# Patient Record
Sex: Female | Born: 1975
Health system: Southern US, Academic
[De-identification: ages and names within clinical notes are randomized; demographics above are authoritative.]

## PROBLEM LIST (undated history)

## (undated) ENCOUNTER — Telehealth

## (undated) ENCOUNTER — Encounter

## (undated) ENCOUNTER — Ambulatory Visit

## (undated) ENCOUNTER — Ambulatory Visit: Payer: MEDICARE

## (undated) ENCOUNTER — Encounter: Attending: Infectious Disease | Primary: Infectious Disease

## (undated) ENCOUNTER — Encounter: Attending: Nephrology | Primary: Nephrology

## (undated) ENCOUNTER — Encounter: Payer: MEDICARE | Attending: Nephrology | Primary: Nephrology

## (undated) ENCOUNTER — Telehealth: Attending: Family | Primary: Family

## (undated) ENCOUNTER — Encounter
Attending: Student in an Organized Health Care Education/Training Program | Primary: Student in an Organized Health Care Education/Training Program

## (undated) ENCOUNTER — Ambulatory Visit
Payer: MEDICARE | Attending: Student in an Organized Health Care Education/Training Program | Primary: Student in an Organized Health Care Education/Training Program

## (undated) ENCOUNTER — Ambulatory Visit: Payer: Medicare (Managed Care)

## (undated) ENCOUNTER — Ambulatory Visit: Payer: MEDICARE | Attending: Nephrology | Primary: Nephrology

## (undated) ENCOUNTER — Non-Acute Institutional Stay: Payer: MEDICARE | Attending: Nephrology | Primary: Nephrology

## (undated) ENCOUNTER — Inpatient Hospital Stay

## (undated) ENCOUNTER — Ambulatory Visit
Payer: Medicare (Managed Care) | Attending: Student in an Organized Health Care Education/Training Program | Primary: Student in an Organized Health Care Education/Training Program

## (undated) ENCOUNTER — Encounter: Attending: Neurology | Primary: Neurology

## (undated) ENCOUNTER — Encounter: Payer: MEDICARE | Attending: Registered" | Primary: Registered"

## (undated) ENCOUNTER — Encounter: Attending: Physician Assistant | Primary: Physician Assistant

## (undated) ENCOUNTER — Encounter
Attending: Pharmacist Clinician (PhC)/ Clinical Pharmacy Specialist | Primary: Pharmacist Clinician (PhC)/ Clinical Pharmacy Specialist

## (undated) ENCOUNTER — Telehealth: Attending: Nephrology | Primary: Nephrology

## (undated) ENCOUNTER — Encounter: Attending: General Practice | Primary: General Practice

## (undated) ENCOUNTER — Ambulatory Visit: Payer: Medicare (Managed Care) | Attending: Nephrology | Primary: Nephrology

## (undated) DIAGNOSIS — G4730 Sleep apnea, unspecified: Secondary | ICD-10-CM

## (undated) DIAGNOSIS — D649 Anemia, unspecified: Secondary | ICD-10-CM

## (undated) DIAGNOSIS — I10 Essential (primary) hypertension: Secondary | ICD-10-CM

## (undated) DIAGNOSIS — N289 Disorder of kidney and ureter, unspecified: Secondary | ICD-10-CM

---
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
## 2007-02-18 ENCOUNTER — Ambulatory Visit: Payer: Self-pay | Admitting: Family Medicine

## 2007-07-15 ENCOUNTER — Ambulatory Visit: Payer: Self-pay | Admitting: Internal Medicine

## 2007-10-18 ENCOUNTER — Ambulatory Visit: Payer: Self-pay | Admitting: Internal Medicine

## 2008-02-27 ENCOUNTER — Ambulatory Visit: Payer: Self-pay | Admitting: Internal Medicine

## 2011-05-29 DIAGNOSIS — M961 Postlaminectomy syndrome, not elsewhere classified: Secondary | ICD-10-CM | POA: Insufficient documentation

## 2011-05-29 DIAGNOSIS — IMO0002 Reserved for concepts with insufficient information to code with codable children: Secondary | ICD-10-CM | POA: Insufficient documentation

## 2011-06-13 DIAGNOSIS — M5126 Other intervertebral disc displacement, lumbar region: Secondary | ICD-10-CM | POA: Insufficient documentation

## 2012-06-26 HISTORY — PX: AV FISTULA PLACEMENT: SHX1204

## 2012-12-02 ENCOUNTER — Ambulatory Visit: Payer: Self-pay | Admitting: Vascular Surgery

## 2012-12-02 LAB — CBC
HGB: 9.4 g/dL — ABNORMAL LOW (ref 12.0–16.0)
MCH: 29.9 pg (ref 26.0–34.0)
MCHC: 34.3 g/dL (ref 32.0–36.0)
Platelet: 320 10*3/uL (ref 150–440)
RBC: 3.13 10*6/uL — ABNORMAL LOW (ref 3.80–5.20)
WBC: 7.5 10*3/uL (ref 3.6–11.0)

## 2012-12-02 LAB — BASIC METABOLIC PANEL
Anion Gap: 9 (ref 7–16)
Chloride: 111 mmol/L — ABNORMAL HIGH (ref 98–107)
Co2: 18 mmol/L — ABNORMAL LOW (ref 21–32)
EGFR (African American): 13 — ABNORMAL LOW
EGFR (Non-African Amer.): 11 — ABNORMAL LOW
Glucose: 104 mg/dL — ABNORMAL HIGH (ref 65–99)
Osmolality: 296 (ref 275–301)
Sodium: 138 mmol/L (ref 136–145)

## 2012-12-06 ENCOUNTER — Ambulatory Visit: Payer: Self-pay | Admitting: Vascular Surgery

## 2013-01-16 ENCOUNTER — Other Ambulatory Visit: Payer: Self-pay | Admitting: Vascular Surgery

## 2013-07-03 DIAGNOSIS — D649 Anemia, unspecified: Secondary | ICD-10-CM | POA: Insufficient documentation

## 2013-11-11 DIAGNOSIS — N186 End stage renal disease: Secondary | ICD-10-CM | POA: Insufficient documentation

## 2013-11-11 DIAGNOSIS — Z992 Dependence on renal dialysis: Secondary | ICD-10-CM

## 2014-09-02 DIAGNOSIS — F418 Other specified anxiety disorders: Secondary | ICD-10-CM | POA: Insufficient documentation

## 2014-09-10 DIAGNOSIS — Z992 Dependence on renal dialysis: Secondary | ICD-10-CM | POA: Insufficient documentation

## 2014-09-10 DIAGNOSIS — Z01818 Encounter for other preprocedural examination: Secondary | ICD-10-CM | POA: Insufficient documentation

## 2014-10-16 NOTE — Op Note (Signed)
PATIENT NAMEKNOWLEDGE, LEADERS MR#:  [id number] DATE OF BIRTH:  1976-03-23  DATE OF PROCEDURE:  12/06/2012  PREOPERATIVE DIAGNOSIS: End-stage renal disease requiring hemodialysis.   POSTOPERATIVE DIAGNOSIS: End-stage renal disease requiring hemodialysis.   PROCEDURE PERFORMED: Creation of a left arm brachiocephalic fistula.   SURGEON: Katha Cabal, MD   ANESTHESIA: MAC.   ESTIMATED BLOOD LOSS: Minimal.   SPECIMEN: None.   INDICATIONS: Vicki Mercer is a 39 year old woman with renal failure, who now is undergoing creation of a fistula for dialysis access. Risks and benefits were reviewed. All questions answered. The patient agrees to proceed.   DESCRIPTION OF PROCEDURE: The patient is taken to the operating room and placed in the supine position. After adequate MAC anesthesia is induced, she is positioned supine with her left arm extended palm upward. The left arm is prepped and draped in a sterile fashion. Appropriate timeout is called.   A surgical marker is then used to mark the presumed incision on the arm and 0.25% Marcaine is infiltrated in the soft tissues. A total of 27 mL of local was used throughout the case. A curvilinear incision is then created and the cephalic vein is exposed. It is dissected circumferentially for a distance of several centimeters. It is marked with a surgical marker for orientation.   The brachial artery is then exposed through the same incision. It is looped proximally and distally.   The vein is then ligated distally with -0 silk ties and transected with Potts scissors, dilated and flushed with a Garrett coronary dilator. Arteriotomy is then made after the Silastic vessel loops are used to control the brachial artery. Stay suture of 6-0 Prolene is placed. The vein is then delivered over to the artery and an end vein to side brachial artery anastomosis is fashioned using running 6-0 Prolene. Flushing maneuvers are performed and flow is established first to the  fistula and then distally to the hand. Radial pulses maintained and an excellent thrill is noted in the fistula. The wound is then irrigated and closed in layers using 3-0 Vicryl, followed by 4-0 Monocryl subcuticular and Dermabond. The patient tolerated the procedure well. There were no immediate complications and she is taken to the recovery area in excellent condition.  ____________________________ Katha Cabal, MD ggs:aw D: 12/06/2012 09:21:00 ET T: 12/06/2012 09:31:04 ET JOB#: [id number]  cc: Katha Cabal, MD, <Dictator> Vilma Prader, MD Katha Cabal MD ELECTRONICALLY SIGNED 12/11/2012 16:08

## 2015-01-27 DIAGNOSIS — I272 Pulmonary hypertension, unspecified: Secondary | ICD-10-CM | POA: Insufficient documentation

## 2015-07-07 ENCOUNTER — Other Ambulatory Visit
Admission: RE | Admit: 2015-07-07 | Discharge: 2015-07-07 | Disposition: A | Discharge status: Home / Self Care | Payer: Medicare Other | Source: Ambulatory Visit | Attending: Internal Medicine | Admitting: Internal Medicine

## 2015-07-07 DIAGNOSIS — E875 Hyperkalemia: Secondary | ICD-10-CM | POA: Diagnosis present

## 2015-07-07 LAB — POTASSIUM: Potassium: 6.2 mmol/L — ABNORMAL HIGH (ref 3.5–5.1)

## 2015-07-14 ENCOUNTER — Other Ambulatory Visit
Admission: RE | Admit: 2015-07-14 | Discharge: 2015-07-14 | Disposition: A | Discharge status: Home / Self Care | Payer: Medicare Other | Source: Ambulatory Visit | Attending: Internal Medicine | Admitting: Internal Medicine

## 2015-07-14 DIAGNOSIS — N186 End stage renal disease: Secondary | ICD-10-CM | POA: Insufficient documentation

## 2015-07-14 LAB — POTASSIUM: POTASSIUM: 5.8 mmol/L — AB (ref 3.5–5.1)

## 2016-03-17 ENCOUNTER — Other Ambulatory Visit
Admission: RE | Admit: 2016-03-17 | Discharge: 2016-03-17 | Disposition: A | Discharge status: Home / Self Care | Payer: Medicare Other | Source: Ambulatory Visit | Attending: Nephrology | Admitting: Nephrology

## 2016-03-17 DIAGNOSIS — N186 End stage renal disease: Secondary | ICD-10-CM | POA: Insufficient documentation

## 2016-03-18 ENCOUNTER — Other Ambulatory Visit
Admission: RE | Admit: 2016-03-18 | Discharge: 2016-03-18 | Disposition: A | Discharge status: Home / Self Care | Payer: Medicare Other | Source: Ambulatory Visit | Attending: Nephrology | Admitting: Nephrology

## 2016-03-18 DIAGNOSIS — R7881 Bacteremia: Secondary | ICD-10-CM | POA: Insufficient documentation

## 2016-03-18 DIAGNOSIS — N186 End stage renal disease: Secondary | ICD-10-CM | POA: Insufficient documentation

## 2016-03-18 DIAGNOSIS — B957 Other staphylococcus as the cause of diseases classified elsewhere: Secondary | ICD-10-CM | POA: Insufficient documentation

## 2016-03-18 DIAGNOSIS — D613 Idiopathic aplastic anemia: Secondary | ICD-10-CM | POA: Insufficient documentation

## 2016-03-18 LAB — HEMOGLOBIN: Hemoglobin: 6.4 g/dL — ABNORMAL LOW (ref 12.0–16.0)

## 2016-03-23 LAB — CULTURE, BLOOD (ROUTINE X 2)
CULTURE: NO GROWTH
Culture: NO GROWTH

## 2016-05-04 ENCOUNTER — Other Ambulatory Visit
Admission: RE | Admit: 2016-05-04 | Discharge: 2016-05-04 | Disposition: A | Discharge status: Home / Self Care | Payer: Medicare Other | Source: Ambulatory Visit | Attending: Nephrology | Admitting: Nephrology

## 2016-05-04 DIAGNOSIS — N186 End stage renal disease: Secondary | ICD-10-CM | POA: Diagnosis present

## 2016-05-04 LAB — BASIC METABOLIC PANEL
ANION GAP: 11 (ref 5–15)
BUN: 47 mg/dL — ABNORMAL HIGH (ref 6–20)
CALCIUM: 8.6 mg/dL — AB (ref 8.9–10.3)
CHLORIDE: 100 mmol/L — AB (ref 101–111)
CO2: 23 mmol/L (ref 22–32)
CREATININE: 7.94 mg/dL — AB (ref 0.44–1.00)
GFR calc non Af Amer: 6 mL/min — ABNORMAL LOW (ref >60)
GFR, EST AFRICAN AMERICAN: 7 mL/min — AB (ref >60)
Glucose, Bld: 120 mg/dL — ABNORMAL HIGH (ref 65–99)
Potassium: 3.2 mmol/L — ABNORMAL LOW (ref 3.5–5.1)
SODIUM: 134 mmol/L — AB (ref 135–145)

## 2016-05-04 LAB — ALBUMIN: ALBUMIN: 3.8 g/dL (ref 3.5–5.0)

## 2016-05-04 LAB — PHOSPHORUS: Phosphorus: 2.3 mg/dL — ABNORMAL LOW (ref 2.5–4.6)

## 2016-08-04 ENCOUNTER — Emergency Department
Admission: EM | Admit: 2016-08-04 | Discharge: 2016-08-04 | Disposition: A | Discharge status: Home / Self Care | Payer: Medicare Other | Attending: Emergency Medicine | Admitting: Emergency Medicine

## 2016-08-04 ENCOUNTER — Encounter: Payer: Self-pay | Admitting: Emergency Medicine

## 2016-08-04 DIAGNOSIS — N186 End stage renal disease: Secondary | ICD-10-CM | POA: Diagnosis not present

## 2016-08-04 DIAGNOSIS — R5383 Other fatigue: Secondary | ICD-10-CM | POA: Diagnosis present

## 2016-08-04 DIAGNOSIS — Z992 Dependence on renal dialysis: Secondary | ICD-10-CM | POA: Insufficient documentation

## 2016-08-04 DIAGNOSIS — I120 Hypertensive chronic kidney disease with stage 5 chronic kidney disease or end stage renal disease: Secondary | ICD-10-CM | POA: Diagnosis not present

## 2016-08-04 DIAGNOSIS — D649 Anemia, unspecified: Secondary | ICD-10-CM | POA: Insufficient documentation

## 2016-08-04 HISTORY — DX: Anemia, unspecified: D64.9

## 2016-08-04 HISTORY — DX: Essential (primary) hypertension: I10

## 2016-08-04 HISTORY — DX: Disorder of kidney and ureter, unspecified: N28.9

## 2016-08-04 LAB — CBC
HEMATOCRIT: 18.7 % — AB (ref 35.0–47.0)
HEMOGLOBIN: 6.5 g/dL — AB (ref 12.0–16.0)
MCH: 33.3 pg (ref 26.0–34.0)
MCHC: 34.9 g/dL (ref 32.0–36.0)
MCV: 95.4 fL (ref 80.0–100.0)
Platelets: 157 10*3/uL (ref 150–440)
RBC: 1.96 MIL/uL — ABNORMAL LOW (ref 3.80–5.20)
RDW: 13.4 % (ref 11.5–14.5)
WBC: 6 10*3/uL (ref 3.6–11.0)

## 2016-08-04 LAB — BASIC METABOLIC PANEL
ANION GAP: 11 (ref 5–15)
BUN: 65 mg/dL — ABNORMAL HIGH (ref 6–20)
CO2: 26 mmol/L (ref 22–32)
Calcium: 7.6 mg/dL — ABNORMAL LOW (ref 8.9–10.3)
Chloride: 100 mmol/L — ABNORMAL LOW (ref 101–111)
Creatinine, Ser: 10.35 mg/dL — ABNORMAL HIGH (ref 0.44–1.00)
GFR calc Af Amer: 5 mL/min — ABNORMAL LOW (ref >60)
GFR calc non Af Amer: 4 mL/min — ABNORMAL LOW (ref >60)
GLUCOSE: 101 mg/dL — AB (ref 65–99)
POTASSIUM: 3.4 mmol/L — AB (ref 3.5–5.1)
Sodium: 137 mmol/L (ref 135–145)

## 2016-08-04 NOTE — ED Triage Notes (Signed)
Says she has been weak and cold and had her hgb checked and it is low.  5.9

## 2016-08-04 NOTE — Discharge Instructions (Signed)
Please go to the clinic on Monday for your Procrit shot which will help you with your anemia. This week and get up slowly with no heavy exertional activity and continue your dialysis as you see fitted home. Return here to emergency department if you pass out or notice blood in the stool or have abdominal pain.  Please return immediately if condition worsens. Please contact her primary physician or the physician you were given for referral. If you have any specialist physicians involved in her treatment and plan please also contact them. Thank you for using [facility].

## 2016-08-04 NOTE — ED Provider Notes (Signed)
Time Seen: Approximately 1512  I have reviewed the triage notes  Chief Complaint: Weakness   History of Present Illness: Vicki Mercer is a 41 y.o. female **who has a long history of anemia associated with renal failure. She states she does home dialysis without incident and appears to be doing her dialysis fairly regularly. The patient states that the anemia occasionally she gets a "" shot "" at an outpatient clinic. She states she's felt some mild generalized fatigue and has felt cold and had her primary care physician check her hemoglobin. They apparently got a level of 5.9 and the patient was referred here to the emergency department for further evaluation. As long history of anemia occasionally gets a blood transfusion. She says it's been several months since she had a "" shot "".*   Past Medical History:  Diagnosis Date  . Anemia   . Hypertension   . Renal disorder     There are no active problems to display for this patient.   No past surgical history on file.  No past surgical history on file.    Allergies:  Ibuprofen  Family History: No family history on file.  Social History: Social History  Substance Use Topics  . Smoking status: Never Smoker  . Smokeless tobacco: Never Used  . Alcohol use No     Review of Systems:   10 point review of systems was performed and was otherwise negative:  Constitutional: No fever Eyes: No visual disturbances ENT: No sore throat, ear pain Cardiac: No chest pain Respiratory: No shortness of breath, wheezing, or stridor Abdomen: No abdominal pain, No melena, no hematochezia no vomiting, No diarrhea Endocrine: No weight loss, No night sweats Extremities: No peripheral edema, cyanosis Skin: No rashes, easy bruising Neurologic: No focal weakness, trouble with speech or swollowing Urologic: No dysuria, Hematuria, or urinary frequency   Physical Exam:  ED Triage Vitals  Enc Vitals Group     BP 08/04/16 1154 (!) 120/48      Pulse Rate 08/04/16 1154 87     Resp 08/04/16 1154 16     Temp 08/04/16 1154 98.3 F (36.8 C)     Temp Source 08/04/16 1154 Oral     SpO2 08/04/16 1154 100 %     Weight 08/04/16 1155 137 lb (62.1 kg)     Height 08/04/16 1155 4\' 11"  (1.499 m)     Head Circumference --      Peak Flow --      Pain Score 08/04/16 1155 0     Pain Loc --      Pain Edu? --      Excl. in [place]? --     General: Awake , Alert , and Oriented times 3; GCS 15 Head: Normal cephalic , atraumatic Eyes: Pupils equal , round, reactive to light Nose/Throat: No nasal drainage, patent upper airway without erythema or exudate.  Neck: Supple, Full range of motion, No anterior adenopathy or palpable thyroid masses Lungs: Clear to ascultation without wheezes , rhonchi, or rales Heart: Regular rate, regular rhythm without murmurs , gallops , or rubs Abdomen: Soft, non tender without rebound, guarding , or rigidity; bowel sounds positive and symmetric in all 4 quadrants. No organomegaly .        Extremities: 2 plus symmetric pulses. No edema, clubbing or cyanosis Neurologic: normal ambulation, Motor symmetric without deficits, sensory intact Skin: warm, dry, no rashes Rectal exam with chaperone present was guaiac negative with a normal sphincter tone  Labs:  All laboratory work was reviewed including any pertinent negatives or positives listed below:  Labs Reviewed  BASIC METABOLIC PANEL - Abnormal; Notable for the following:       Result Value   Potassium 3.4 (*)    Chloride 100 (*)    Glucose, Bld 101 (*)    BUN 65 (*)    Creatinine, Ser 10.35 (*)    Calcium 7.6 (*)    GFR calc non Af Amer 4 (*)    GFR calc Af Amer 5 (*)    All other components within normal limits  CBC - Abnormal; Notable for the following:    RBC 1.96 (*)    Hemoglobin 6.5 (*)    HCT 18.7 (*)    All other components within normal limits    EKG: * ED ECG REPORT I, Daymon Larsen, the attending physician, personally viewed and  interpreted this ECG.  Date: 08/04/2016 EKG Time: 1247 Rate:88 Rhythm: normal sinus rhythm QRS Axis: normal Intervals: normal ST/T Wave abnormalities: normal Conduction Disturbances: none Narrative Interpretation: unremarkable Left ventricular hypertrophy  ED Course:  Patient's stay here was uneventful and the patient's otherwise hemodynamically stable. Her vitals are within normal limits and she has not had any feelings of lightheadedness or near syncope. The patient sounds as though she gets Procrit and states that she can go to the outpatient clinic on Monday to get her shots. The case was reviewed briefly with the hospitalist and also nephrology about whether the patient necessary require inpatient management IV blood transfusion. We felt since she is essentially asymptomatic and does have outpatient resources for the Procrit, that she can follow up as directed. She was advised to return here to the emergency department if she develops a fever, chest or abdominal pain, feelings of lightheadedness or any other new concerns   Final Clinical Impression:  Final diagnoses:  Anemia, unspecified type     Plan: Outpatient Patient was advised to return immediately if condition worsens. Patient was advised to follow up with their primary care physician or other specialized physicians involved in their outpatient care. The patient and/or family member/power of attorney had laboratory results reviewed at the bedside. All questions and concerns were addressed and appropriate discharge instructions were distributed by the nursing staff.             Daymon Larsen, MD 08/04/16 [phone]

## 2016-09-06 ENCOUNTER — Encounter: Payer: Self-pay | Admitting: Obstetrics and Gynecology

## 2016-09-08 ENCOUNTER — Encounter: Payer: Self-pay | Admitting: Obstetrics and Gynecology

## 2016-09-20 ENCOUNTER — Encounter: Payer: Self-pay | Admitting: Obstetrics and Gynecology

## 2016-10-02 ENCOUNTER — Encounter: Payer: Self-pay | Admitting: Obstetrics and Gynecology

## 2016-12-19 ENCOUNTER — Ambulatory Visit
Admission: RE | Admit: 2016-12-19 | Discharge: 2016-12-19 | Disposition: A | Discharge status: Home / Self Care | Payer: MEDICARE

## 2016-12-26 DIAGNOSIS — Z01818 Encounter for other preprocedural examination: Secondary | ICD-10-CM

## 2016-12-26 DIAGNOSIS — N186 End stage renal disease: Secondary | ICD-10-CM

## 2016-12-26 DIAGNOSIS — Z7682 Awaiting organ transplant status: Principal | ICD-10-CM

## 2016-12-29 ENCOUNTER — Ambulatory Visit: Admission: RE | Admit: 2016-12-29 | Discharge: 2016-12-29

## 2017-01-04 ENCOUNTER — Ambulatory Visit
Admission: RE | Admit: 2017-01-04 | Discharge: 2017-01-04 | Disposition: A | Discharge status: Home / Self Care | Admitting: Family

## 2017-01-04 ENCOUNTER — Ambulatory Visit: Admission: RE | Admit: 2017-01-04 | Discharge: 2017-01-04 | Payer: MEDICARE

## 2017-01-04 ENCOUNTER — Ambulatory Visit
Admission: RE | Admit: 2017-01-04 | Discharge: 2017-01-04 | Disposition: A | Discharge status: Home / Self Care | Payer: MEDICARE

## 2017-01-04 ENCOUNTER — Ambulatory Visit
Admission: RE | Admit: 2017-01-04 | Discharge: 2017-01-04 | Disposition: A | Discharge status: Home / Self Care | Payer: MEDICARE | Attending: Clinical | Admitting: Clinical

## 2017-01-04 DIAGNOSIS — Z01810 Encounter for preprocedural cardiovascular examination: Secondary | ICD-10-CM

## 2017-01-04 DIAGNOSIS — Z7289 Other problems related to lifestyle: Secondary | ICD-10-CM

## 2017-01-04 DIAGNOSIS — Z7682 Awaiting organ transplant status: Principal | ICD-10-CM

## 2017-01-04 DIAGNOSIS — N186 End stage renal disease: Principal | ICD-10-CM

## 2017-01-04 DIAGNOSIS — Z008 Encounter for other general examination: Secondary | ICD-10-CM

## 2017-01-04 DIAGNOSIS — Z01818 Encounter for other preprocedural examination: Principal | ICD-10-CM

## 2017-01-04 DIAGNOSIS — Z992 Dependence on renal dialysis: Secondary | ICD-10-CM

## 2017-01-09 ENCOUNTER — Ambulatory Visit: Admit: 2017-01-09 | Discharge: 2017-01-09 | Disposition: A | Discharge status: Home / Self Care

## 2017-01-10 ENCOUNTER — Ambulatory Visit: Admission: RE | Admit: 2017-01-10 | Discharge: 2017-01-10 | Disposition: A | Discharge status: Home / Self Care

## 2017-01-10 ENCOUNTER — Ambulatory Visit
Admission: RE | Admit: 2017-01-10 | Discharge: 2017-01-10 | Disposition: A | Discharge status: Home / Self Care | Payer: MEDICAID

## 2017-01-10 DIAGNOSIS — Z7682 Awaiting organ transplant status: Principal | ICD-10-CM

## 2017-01-11 ENCOUNTER — Inpatient Hospital Stay
Admission: RE | Admit: 2017-01-11 | Discharge: 2017-01-15 | Disposition: A | Discharge status: Home / Self Care | Payer: MEDICARE

## 2017-01-11 ENCOUNTER — Inpatient Hospital Stay: Admission: RE | Admit: 2017-01-11 | Discharge: 2017-01-15 | Disposition: A | Discharge status: Home / Self Care

## 2017-01-11 DIAGNOSIS — Z940 Kidney transplant status: Secondary | ICD-10-CM | POA: Insufficient documentation

## 2017-01-11 DIAGNOSIS — Z7682 Awaiting organ transplant status: Principal | ICD-10-CM

## 2017-01-11 DIAGNOSIS — I120 Hypertensive chronic kidney disease with stage 5 chronic kidney disease or end stage renal disease: Principal | ICD-10-CM

## 2017-01-11 HISTORY — PX: KIDNEY TRANSPLANT: SHX239

## 2017-01-12 MED ORDER — MYFORTIC 180 MG TABLET,DELAYED RELEASE
ORAL_TABLET | Freq: Two times a day (BID) | ORAL | 11 refills | 0.00000 days | Status: CP
Start: 2017-01-12 — End: 2017-02-16

## 2017-01-12 MED ORDER — VALGANCICLOVIR 450 MG TABLET
ORAL_TABLET | Freq: Every day | ORAL | 2 refills | 0 days | Status: CP
Start: 2017-01-12 — End: 2017-01-15

## 2017-01-12 MED ORDER — PROGRAF 1 MG CAPSULE
ORAL_CAPSULE | Freq: Two times a day (BID) | ORAL | 11 refills | 0 days | Status: CP
Start: 2017-01-12 — End: 2017-01-18

## 2017-01-12 NOTE — Unmapped (Signed)
Surgery Progress Note    Hospital Day: 2    Assessment:     Gina Hart is a 41 y.o. female with history of ESRD 2/2 FSGS and HTN who is anuric on HD who is s/p deceased donor renal transplant on 01/11/17.    Interval Events:     Afebrile with no acute events overnight. Pain is well controlled with the fPCA. Voiding well.     Plan:     Neuro  -fPCA    CV  HDS  - Metoprolol 50mg  BID    Pulm  - Stable on RA    FEN/GI:  - dc 1:1 replacement   - NS 125 ml/hr   - Adv. To clear liquid diet  - dc RIJ line     GU  - Foley x 7 days     - Heme/ID  Afebrile  WBC and Hgb stable    Dispo  - Tx to floor status    DVT PPx:  - SQH       Objective:        Vital Signs:  BP 138/72  - Pulse 88  - Temp 37.1 ??C (Oral)  - Resp 19  - Ht 149.9 cm (4' 11)  - Wt 63.3 kg (139 lb 8.8 oz)  - SpO2 100%  - BMI 28.19 kg/m??     Input/Output:  I/O last 3 completed shifts:  In: 6754.5 [I.V.:6532.5; IV Piggyback:222]  Out: 2870 [Urine:2775; Drains:65; Blood:30]    Physical Exam:    General: Resting comfortably in bed in no acute distress  Pulmonary: Non labored breathing, stable on RA  Abdomen: Soft, minimally tender to palpation; surgical dressing c/d/I; JP drain with SS output  Extremities: Warm and well perfused  Neuro: Alert and oriented    Labs:    Lab Results   Component Value Date    WBC 17.3 (H) 01/12/2017    HGB 7.4 (L) 01/12/2017    HCT 21.7 (L) 01/12/2017    PLT 178 01/12/2017       Lab Results   Component Value Date    NA 139 01/12/2017    K 3.2 (L) 01/12/2017    CL 105 01/12/2017    CO2 20.0 (L) 01/12/2017    BUN 41 (H) 01/12/2017    CREATININE 6.49 (H) 01/12/2017    CALCIUM 7.0 (L) 01/12/2017    MG 1.8 01/12/2017    PHOS 5.5 (H) 01/12/2017       Microbiology Results (last day)     ** No results found for the last 24 hours. **          Imaging:    All pertinent imaging personally reviewed.    Xr Chest Portable    Result Date: 01/12/2017  EXAM: CHEST ONE VIEW DATE: 01/11/2017 9:45 PM ACCESSION: [id number] UN DICTATED: 01/11/2017 9:48 PM INTERPRETATION LOCATION: [facility] CLINICAL INDICATION: 41 years old Female with S/P Abdominal Transplant:  Check Line and ETT Placement--  COMPARISON: 01/11/2017 CXR and earlier exams TECHNIQUE: Portable AP view of the chest. CONCLUSIONS: --An endotracheal tube is not visualized. --Right IJ approach CVC tip projects over the lower right atrium. Consider 3-5 cm retraction for optimal positioning. --Radiographic appearance of the heart and lungs is otherwise unchanged from earlier same day.      US Renal Transplant W Doppler    Result Date: 01/12/2017  EXAM: US RENAL TRANSPLANT W DOPPLER DATE: 01/11/2017 9:40 PM ACCESSION: [id number] UN DICTATED: 01/11/2017 9:41 PM INTERPRETATION LOCATION: [facility] CLINICAL INDICATION: Evaluate  vascular patency. COMPARISON: None TECHNIQUE:  Ultrasound views of the renal transplant were obtained using gray scale and color and spectral Doppler imaging. Views of the urinary bladder were obtained using gray scale imaging. FINDINGS: TRANSPLANTED KIDNEY: The renal transplant was located in the right lower quadrant. Normal size and echogenicity.  No solid masses or calculi. No perinephric collections identified. No hydronephrosis. VESSELS: - Perfusion: Using power Doppler, normal perfusion was seen throughout the renal parenchyma. - Resistive indices in the renal transplant are within normal limits. - Main renal artery/iliac artery: Patent - Main renal vein/iliac vein: Patent BLADDER: Foley catheter in place.      -Patent renal transplant vasculature with adequate perfusion. Please see below for data measurements: RLQ kidney: length 11.0 cm; width 4.3 cm; height 4.4 cm Arcuate artery superior resistive indices: 0.65   Arcuate artery mid resistive indices: 0.71    Arcuate artery inferior resistive indices: 0.66   Segmental artery superior resistive indices: 0.65   Segmental artery mid resistive indices: 0.74   Segmental artery inferior resistive indices: 0.74   Main renal artery resistive indices: 0.75-0.80   Main renal vein: PATENT   Iliac artery: Patent   Iliac vein: Patent        Xr Chest 2 Views    Result Date: 01/11/2017  EXAM: CHEST TWO VIEW DATE: 01/11/2017 5:47 AM ACCESSION: [id number] UN DICTATED: 01/11/2017 5:51 AM INTERPRETATION LOCATION: [facility] CLINICAL INDICATION: 41 years old Female with OTHER-preop kidney txp-  COMPARISON: Chest radiograph dated 12/06/2016 TECHNIQUE: PA and lateral views of the chest. FINDINGS: Lungs well inflated and clear. No overt pulmonary edema. No pneumothorax or pleural effusion. Cardiomediastinal silhouette is unchanged. Unchanged posterior spinal fixation hardware.      -No acute airspace disease. -Stable cardiomegaly.

## 2017-01-13 DIAGNOSIS — I120 Hypertensive chronic kidney disease with stage 5 chronic kidney disease or end stage renal disease: Principal | ICD-10-CM

## 2017-01-14 NOTE — Unmapped (Signed)
SURGERY PROGRESS NOTES    Assessment/Plan:  Gina Hart is a 41 y.o. female w/ PMH of HTN, anemia, ESRD, anxiety, depression, pulmonary HTN who is s/p renal transplant on 01/11/17.    Neuro:   - AAO x 3.   - Sch tylenol, PRN oxy    CV:   - HDS.  - Home metoprolol, start amlodipine 5 mg for HTN, PRN hydral for HTN    Pulmonary:   - Stable on room air     FEN/GI:   - F: [place]   - E: stable  - N: regular diet  - GI: Pepcid, colace, Miralax    GU:   - Foley in place - stays at least 7 days  - Monitor urine output     Heme/ID:  - Transfuse 2u pRBC 7/21 - appropriate response, Hgb 9.1 this AM  - Afebrile.   - Cellcept, methylprednisolone for transplant rejection ppx  - bactrim, valcyte for transplant infection ppx    Psych:  - home Zoloft    Prophylaxis: pepcid, heparin, SCDs, bactrim, valcyte    Disposition: floor status    Subjective:  NAEO    Objective:    Vital signs:  BP 149/78  - Pulse 89  - Temp 36.6 ??C (Oral)  - Resp 18  - Ht 149.9 cm (4' 11)  - Wt 69.2 kg (152 lb 8 oz)  - SpO2 96%  - BMI 30.80 kg/m??     Intake/Output:  I/O last 3 completed shifts:  In: 2846.3 [P.O.:1440; I.V.:1406.3]  Out: 2535 [Urine:2460; Drains:75]    Physical Exam:  General: no acute distress  CV: regular rate, hemodynamically normal/stable  Resp: breathing comfortably on room air  Abd: Soft, appropriately TTP, ND. No rebound or guarding. RLQ incision dressing c/d/i. JP drain in place w/ SS output.   Ext: no cyanosis, edema or clubbing.       Data Review:  Lab Results   Component Value Date    WBC 9.4 01/14/2017    HGB 9.1 (L) 01/14/2017    HCT 26.5 (L) 01/14/2017    PLT 165 01/14/2017       Lab Results   Component Value Date    NA 139 01/14/2017    K 3.7 01/14/2017    CL 111 (H) 01/14/2017    CO2 21.0 (L) 01/14/2017    BUN 18 01/14/2017    CREATININE 1.36 (H) 01/14/2017    CALCIUM 7.9 (L) 01/14/2017    MG 1.9 01/14/2017    PHOS 2.3 (L) 01/14/2017

## 2017-01-15 MED ORDER — ACETAMINOPHEN 325 MG TABLET
ORAL_TABLET | ORAL | 0 refills | 0.00000 days | Status: CP | PRN
Start: 2017-01-15 — End: ?

## 2017-01-15 MED ORDER — VALGANCICLOVIR 450 MG TABLET: 900 mg | tablet | Freq: Every day | 2 refills | 0 days | Status: AC

## 2017-01-15 MED ORDER — MAGNESIUM OXIDE 400 MG (241.3 MG MAGNESIUM) TABLET
ORAL_TABLET | Freq: Two times a day (BID) | ORAL | 11 refills | 0 days | Status: CP
Start: 2017-01-15 — End: 2017-02-09

## 2017-01-15 MED ORDER — VALGANCICLOVIR 450 MG TABLET
ORAL_TABLET | Freq: Every day | ORAL | 5 refills | 0.00000 days | Status: CP
Start: 2017-01-15 — End: 2017-02-16

## 2017-01-15 MED ORDER — ASPIRIN 81 MG TABLET,DELAYED RELEASE
ORAL_TABLET | Freq: Every day | ORAL | 0 refills | 0 days | Status: CP
Start: 2017-01-15 — End: 2017-01-23

## 2017-01-15 MED ORDER — POLYETHYLENE GLYCOL 3350 17 GRAM ORAL POWDER PACKET
PACK | Freq: Every day | ORAL | 0 refills | 0 days | Status: CP | PRN
Start: 2017-01-15 — End: 2017-01-18

## 2017-01-15 MED ORDER — OXYCODONE 5 MG TABLET
ORAL_TABLET | ORAL | 0 refills | 0 days | Status: CP | PRN
Start: 2017-01-15 — End: 2017-01-22

## 2017-01-15 MED ORDER — TACROLIMUS 5 MG CAPSULE
ORAL_CAPSULE | Freq: Two times a day (BID) | ORAL | 11 refills | 0 days | Status: CP
Start: 2017-01-15 — End: 2017-01-18

## 2017-01-15 MED ORDER — DOCUSATE SODIUM 100 MG CAPSULE
ORAL_CAPSULE | Freq: Two times a day (BID) | ORAL | 0 refills | 0 days | Status: CP | PRN
Start: 2017-01-15 — End: 2017-01-18

## 2017-01-15 MED ORDER — FAMOTIDINE 20 MG TABLET
ORAL_TABLET | Freq: Two times a day (BID) | ORAL | 0 refills | 0.00000 days | Status: CP
Start: 2017-01-15 — End: 2017-02-28

## 2017-01-15 MED FILL — POLYETHYLENE GLYCOL/3350/PK: POLYETHYLENE GLYCOL/3350/PK | 24 days supply | Qty: 24 | Fill #0

## 2017-01-15 MED FILL — ACETAMINOPHEN 325MG GERI-CARE/325MG/TABS: ACETAMINOPHEN 325MG GERI-CARE/325MG/TABS | 9 days supply | Qty: 100 | Fill #0

## 2017-01-15 MED FILL — MAGNESIUM OXIDE/400MG/TABS: MAGNESIUM OXIDE/400MG/TABS | 60 days supply | Qty: 120 | Fill #0

## 2017-01-15 MED FILL — AMLODIPINE/5MG/TABS: AMLODIPINE/5MG/TABS | 30 days supply | Qty: 30 | Fill #0

## 2017-01-15 MED FILL — MYFORTIC/180MG/TAB: MYFORTIC/180MG/TAB | 30 days supply | Qty: 180 | Fill #0

## 2017-01-15 MED FILL — VALGANCICLOVIR/450MG/TABS: VALGANCICLOVIR/450MG/TABS | 30 days supply | Qty: 60 | Fill #0

## 2017-01-15 MED FILL — SULFAMETHOXAZOLE/TRIMETHO/400-80MG/TABS: SULFAMETHOXAZOLE/TRIMETHO/400-80MG/TABS | 28 days supply | Qty: 12 | Fill #0

## 2017-01-15 MED FILL — DOCUSATE/100MG/CAPS: DOCUSATE/100MG/CAPS | 30 days supply | Qty: 60 | Fill #0

## 2017-01-15 MED FILL — GNP FAMOTIDINE 20MG/20MG/TABS: GNP FAMOTIDINE 20MG/20MG/TABS | 25 days supply | Qty: 50 | Fill #0

## 2017-01-15 MED FILL — PROGRAF/1MG/CAP: PROGRAF/1MG/CAP | 30 days supply | Qty: 300 | Fill #0

## 2017-01-15 MED FILL — OXYCODONE HCL/5MG/TABS: OXYCODONE HCL/5MG/TABS | 5 days supply | Qty: 60 | Fill #0

## 2017-01-15 MED FILL — ASPIRIN/81MG EC/TBEC: ASPIRIN/81MG EC/TBEC | 30 days supply | Qty: 30 | Fill #0

## 2017-01-16 MED ORDER — AMLODIPINE 5 MG TABLET
ORAL_TABLET | Freq: Every day | ORAL | 11 refills | 0 days | Status: CP
Start: 2017-01-16 — End: 2017-01-23

## 2017-01-17 ENCOUNTER — Ambulatory Visit: Admission: RE | Admit: 2017-01-17 | Discharge: 2017-01-17 | Disposition: A | Discharge status: Home / Self Care

## 2017-01-17 MED ORDER — SULFAMETHOXAZOLE 400 MG-TRIMETHOPRIM 80 MG TABLET
ORAL_TABLET | ORAL | 5 refills | 0.00000 days | Status: CP
Start: 2017-01-17 — End: 2017-01-23

## 2017-01-18 ENCOUNTER — Ambulatory Visit
Admission: RE | Admit: 2017-01-18 | Discharge: 2017-01-18 | Disposition: A | Discharge status: Home / Self Care | Payer: MEDICARE

## 2017-01-18 ENCOUNTER — Ambulatory Visit: Admission: RE | Admit: 2017-01-18 | Discharge: 2017-01-18 | Disposition: A | Discharge status: Home / Self Care

## 2017-01-18 ENCOUNTER — Ambulatory Visit
Admission: RE | Admit: 2017-01-18 | Discharge: 2017-01-18 | Disposition: A | Discharge status: Home / Self Care | Admitting: Surgery

## 2017-01-18 ENCOUNTER — Ambulatory Visit
Admission: RE | Admit: 2017-01-18 | Discharge: 2017-01-18 | Disposition: A | Discharge status: Home / Self Care | Payer: MEDICARE | Attending: Pharmacist Clinician (PhC)/ Clinical Pharmacy Specialist | Admitting: Pharmacist Clinician (PhC)/ Clinical Pharmacy Specialist

## 2017-01-18 DIAGNOSIS — Z79899 Other long term (current) drug therapy: Secondary | ICD-10-CM

## 2017-01-18 DIAGNOSIS — Z940 Kidney transplant status: Principal | ICD-10-CM

## 2017-01-18 MED ORDER — PROGRAF 1 MG CAPSULE
ORAL_CAPSULE | Freq: Two times a day (BID) | ORAL | 11 refills | 0.00000 days | Status: CP
Start: 2017-01-18 — End: 2017-01-24

## 2017-01-18 MED ORDER — CYCLOBENZAPRINE 10 MG TABLET
ORAL_TABLET | Freq: Two times a day (BID) | ORAL | 0 refills | 0.00000 days | Status: CP | PRN
Start: 2017-01-18 — End: ?

## 2017-01-18 NOTE — Unmapped (Signed)
Foley cathter removed at 10:30 am with 100 ml of urine in foley bag. Given hat to void in.    Voided 50 ml at 10:55 am. To void  again before leaving.  Second void 50 ml.

## 2017-01-18 NOTE — Unmapped (Signed)
TRANSPLANT SURGERY PROGRESS NOTES    Assessment/Plan:   Gina Hart is a 41 yo female s/p DCD renal transplant on 01/11/17. She is doing well today. We have removed her foley for a trial of void. We will also send her JP fluid to test for Cr. We will plan to see her back in clinic on Monday.     - JP creatinine today  - TOV today  - RTC on Monday    Julio Alm, MD  [facility] General Surgery  PGY-3    Subjective   Gina Hart is a 41 y.o. female with a hx of HTN, anemia, ESRD, anxiety, depression, pulmonary HTN who is s/p renal transplant on 01/11/17. Her hospital course was uneventful. She returns today for close follow up and a trial of void. She states she is doing well. Denies fevers, chills, N/V, CP, SOB or leg swelling. She is making about 1-1.5L of urine per day. Her JP drain is putting out <20cc/day of serous fluid.     Objective     Vitals:    01/18/17 1015   BP: 155/92   Pulse: 71   Temp: 36.7 ??C   TempSrc: Tympanic   Weight: 64.9 kg (143 lb)   Height: 149.9 cm (4' 11.02)      Body mass index is 28.87 kg/m??.     Physical Exam:    General Appearance:   No acute distress  Lungs:                Breathing comfortably on room air.   Heart:                           Regular rate and rhythm  Abdomen:                Soft, non-tender, non-distended, RLQ incision closed w/ staples, c/d/i. JP drain serous w/ minimal output.   Extremities:              Warm and well perfused

## 2017-01-19 LAB — TACROLIMUS, TROUGH: Lab: 6.7

## 2017-01-19 NOTE — Unmapped (Signed)
[facility] TRANS[facility] PHARMACY NOTE  01/18/2017   Gina Hart  [id number]    Medication changes today:   1.famotidine to daily prn  2. Increase prograf to 6 mg bid  3.increase amlodipine to 10 mg HS  4. Resume home flexeril- rx x 1 time     Education/Adherence tools provided today:  1.provided updated medication list  2. provided additional pill box education  3.  provided additional education on immunosuppression and transplant related medications including reviewing indications of medications, dosing and side effects    Follow up items:  1. goal of understanding indications and dosing of immunosuppression medications  2.    Next visit with pharmacy in 1-2 weeks  ____________________________________________________________________    Gina Hart is a 41 y.o. female s/p deceased kidney transplant on 2017-01-12 (Kidney) 2/2 FSGS- uncomplicated post op course    Seen by pharmacy today for: medication management and pill box fill and adherence education; last seen by pharmacy first visit     CC:  Patient has no complaints today     Vitals:    01/18/17 1020   BP: 155/92   Pulse: 71   Temp: 36.7 ??C (98.1 ??F)       Allergies   Allergen Reactions   ??? Ibuprofen Swelling     Other reaction(s): SWELLING/EDEMA       All medications reviewed and updated.     Medication list includes revisions made during today???s encounter    Outpatient Encounter Prescriptions as of 01/18/2017   Medication Sig Dispense Refill   ??? acetaminophen (TYLENOL) 325 MG tablet Take 1-2 tablets (325-650 mg total) by mouth every four (4) hours as needed for pain. 100 tablet 0   ??? amLODIPine (NORVASC) 5 MG tablet Take 1 tablet (5 mg total) by mouth daily. 30 tablet 11   ??? aspirin (ECOTRIN) 81 MG tablet Take 1 tablet (81 mg total) by mouth daily. 30 tablet 0   ??? cyclobenzaprine (FLEXERIL) 10 MG tablet Take 10 mg by mouth two (2) times a day as needed for muscle spasms.     ??? docusate sodium (COLACE) 100 MG capsule Take 1 capsule (100 mg total) by mouth two (2) times a day as needed for constipation. 60 capsule 0   ??? famotidine (PEPCID) 20 MG tablet Take 1 tablet (20 mg total) by mouth Two (2) times a day. 60 tablet 0   ??? loratadine (CLARITIN) 10 mg tablet Take 10 mg by mouth daily as needed for allergies.     ??? magnesium oxide (MAG-OX) 400 mg tablet Take 1 tablet (400 mg total) by mouth Two (2) times a day. HOLD until directed to start by your coordinator. 60 tablet 11   ??? metoprolol tartrate (LOPRESSOR) 100 MG tablet Take 1 tablet (100 mg total) by mouth Two (2) times a day. 180 tablet 3   ??? MYFORTIC 180 mg EC tablet Take 3 tablets (540 mg total) by mouth Two (2) times a day. 180 tablet 11   ??? oxyCODONE (ROXICODONE) 5 MG immediate release tablet Take 1-2 tablets (5-10 mg total) by mouth every four (4) hours as needed. for up to 7 days 60 tablet 0   ??? polyethylene glycol (MIRALAX) 17 gram packet Take 17 g by mouth daily as needed. 24 packet 0   ??? PROGRAF 1 mg capsule Take 5 capsules (5 mg total) by mouth Two (2) times a day. 300 capsule 11   ??? sertraline (ZOLOFT) 100 MG tablet Take 1 tablet (100 mg  total) by mouth daily. 90 tablet 3   ??? sulfamethoxazole-trimethoprim (BACTRIM,SEPTRA) 400-80 mg per tablet Take 1 tablet (80 mg of trimethoprim total) by mouth Every Monday, Wednesday, and Friday. 12 tablet 5   ??? tacrolimus (PROGRAF) 5 MG capsule Take 1 capsule (5 mg total) by mouth Two (2) times a day (8am and 8pm). 60 capsule 11   ??? traZODone (DESYREL) 50 MG tablet Take 1 tablet (50 mg total) by mouth nightly. 90 tablet 1   ??? valGANciclovir (VALCYTE) 450 mg tablet Take 2 tablets (900 mg total) by mouth daily. 60 tablet 5     No facility-administered encounter medications on file as of 01/18/2017.        Induction agent : alemtuzumab    CURRENT IMMUNOSUPPRESSION: prograf 5 mg bid mg PO BID  prograf/cyclosporine goal: 8-10   myfortic 540 mg PO BID    Patient is tolerating immunosuppression well    IMMUNOSUPPRESSION DRUG LEVELS:  Lab Results   Component Value Date TACROLIMUS 5.2 01/15/2017    TACROLIMUS 4.4 01/14/2017    TACROLIMUS 2.9 01/13/2017     Pt did not get labs today. Previously tacs are subtherapeutic    Graft function: improving  DSA:   Biopsies to date:   WBC/ANC:  wnl    Plan: Will increase tac to 6 mg bid    ID prophylaxis:   CMV Status: D+/ R-, high risk. CMV prophylaxis: valganciclovir 900 mg daily x 6 months per protocol.  No results found for: CMVCP  PCP Prophylaxis: bactrim SS 1 tab MWF x 6 months.  Thrush: completed  Patient is  tolerating infectious prophylaxis well    Plan: Continue per protocol    CV Prophylaxis: asa 81 mg   The ASCVD Risk score Denman George DC Montez Hageman, et al., 2013) failed to calculate.  Statin therapy: Not indicated     BP: Goal < 140/90. Clinic vitals reported above  Home BP ranges: 135-189/80-90s  Current meds include: amlodipine 5 mg daily  Plan: out of goal- increase amlodipine to 10 mg daily. Continue to monitor    Anemia:  H/H:   Lab Results   Component Value Date    HGB 10.2 (L) 01/17/2017     Lab Results   Component Value Date    HCT 29.5 (L) 01/17/2017     Iron panel:  Lab Results   Component Value Date    IRON 21 (L) 01/12/2017    TIBC 238.9 (L) 01/12/2017    FERRITIN 1,200.0 (H) 01/12/2017     Lab Results   Component Value Date    LABIRON 9 (L) 01/12/2017   Prior ESA use:  ntd  Plan: Continue to monitor.     DM:   Lab Results   Component Value Date    A1C 5.3 01/11/2017   . Goal A1c < 7  History of Dm? No  Established with endocrinologist/PCP for BG managment? No  Plan: continue to monitor    Electrolytes: wnl  Meds currently on: none  Plan: continue to monitor    BM: wnl 2 per day  Meds currently on: not taking prn meds  Plan: none    Pain: minimal- mostly complains of back pain and using oxycodone for that because she is out of flexeril   Meds currently on: oxycodone prn  Plan: educated that oxycodone should not be used for back pain. Will rx one time flexeril to pharmacy and told pt to get follow up management and refills from local provider. Pt understands  this plan.   Discussed increased activity to improve mobility and promote recovery.   Discussed ways to decreased oxycodone including  Reduce dose from 2 pills to 1 pill  Reduce frequency and only take for severe pain  Alternate and substitute acetaminophen NMT 3000 mg daily for mild to moderate pain    Bone health:   Vitamin D Level: none. Goal > 30.   Last DEXA results:    Current meds include: none  Plan: recommend vitamin d monitoring at future appt. Continue to monitor.     Women's/Men's Health:  Rinnah Peppel is a 41 y.o. Female perimenopausal. Patient reports no men's/women's health issues  Plan: continue to monitor    Adherence: Patient has average understanding of medications; was able to independently identify names/doses of immunosuppressants and OI meds.  Patient  does fill their own pill box on a regular basis at home.  Patient brought medication card:no  Pill box:did not bring  Patient requested refills for the following meds: flexeril  Corrections needed in Epic medication list: 2   Plan:  provided moderate adherence counseling/intervention    Other: decrease famotidine to daily - continue to titrate off if not needed    Spent approximately 30 minutes on educating this patient and greater than 50% was spent in direct face to face counseling regarding post transplant medication education. Questions and concerns were address to patient's satisfaction.    Patient was reviewed with Dr. Norma Fredrickson who was agreement with the stated plan:     During this visit, the following was completed:   BG log data assessment  BP log data assessment  Labs ordered and evaluated  complex treatment plan >1 DS   Patient education was completed for 11-24 minutes     All questions/concerns were addressed to the patient's satisfaction.  __________________________________________  Noah Charon, PHARMD, CPP  [facility]  PAGER [phone]

## 2017-01-19 NOTE — Unmapped (Unsigned)
Prograf - $0

## 2017-01-22 LAB — CBC W/ DIFFERENTIAL
BASOPHILS ABSOLUTE COUNT: 0.1 10*9/L — ABNORMAL HIGH
BASOPHILS RELATIVE PERCENT: 0.5 %
EOSINOPHILS ABSOLUTE COUNT: 0 10*9/L — ABNORMAL LOW
EOSINOPHILS RELATIVE PERCENT: 0 % — ABNORMAL LOW
HEMATOCRIT: 34.5 % — ABNORMAL LOW
HEMOGLOBIN: 11.6 g/dL — ABNORMAL LOW
LYMPHOCYTES ABSOLUTE COUNT: 0 10*9/L — ABNORMAL LOW
MEAN CORPUSCULAR HEMOGLOBIN CONC: 33.5 g/dL
MEAN CORPUSCULAR HEMOGLOBIN: 30.4 pg
MEAN PLATELET VOLUME: 7.6 fL
MONOCYTES ABSOLUTE COUNT: 0.3 10*9/L
MONOCYTES RELATIVE PERCENT: 2.2 % — ABNORMAL LOW
NEUTROPHILS ABSOLUTE COUNT: 11.4 10*9/L — ABNORMAL HIGH
NEUTROPHILS RELATIVE PERCENT: 97 % — ABNORMAL HIGH
PLATELET COUNT: 306 10*9/L
RED BLOOD CELL COUNT: 3.8 10*12/L — ABNORMAL LOW
RED CELL DISTRIBUTION WIDTH: 16.3 % — ABNORMAL HIGH
WBC ADJUSTED: 11.8 10*9/L — ABNORMAL HIGH

## 2017-01-22 LAB — BASIC METABOLIC PANEL
BLOOD UREA NITROGEN: 25 mg/dL — ABNORMAL HIGH
CALCIUM: 9.6 mg/dL
CHLORIDE: 103 mmol/L
CO2: 24.5 mmol/L
EGFR MDRD AF AMER: 66 mL/min/{1.73_m2}
GLUCOSE RANDOM: 100 mg/dL — ABNORMAL HIGH
SODIUM: 135 mmol/L — ABNORMAL LOW

## 2017-01-22 LAB — MEAN CORPUSCULAR HEMOGLOBIN: Lab: 30.4

## 2017-01-22 LAB — PHOSPHORUS: Lab: 4.8 — ABNORMAL HIGH

## 2017-01-22 LAB — EGFR MDRD NON AF AMER: Lab: 0

## 2017-01-22 LAB — MAGNESIUM: Lab: 1.7 — ABNORMAL LOW

## 2017-01-22 NOTE — Unmapped (Signed)
Reviewed K+ 5.8 with Dr. Zebedee Iba.  Patient will come in tomorrow for labs and to have abd drain pulled.

## 2017-01-23 ENCOUNTER — Ambulatory Visit: Admission: RE | Admit: 2017-01-23 | Discharge: 2017-01-23 | Payer: MEDICARE

## 2017-01-23 ENCOUNTER — Ambulatory Visit
Admission: RE | Admit: 2017-01-23 | Discharge: 2017-01-23 | Disposition: A | Discharge status: Home / Self Care | Admitting: Family

## 2017-01-23 DIAGNOSIS — Z4803 Encounter for change or removal of drains: Secondary | ICD-10-CM

## 2017-01-23 DIAGNOSIS — Z940 Kidney transplant status: Principal | ICD-10-CM

## 2017-01-23 LAB — CBC W/ AUTO DIFF
BASOPHILS ABSOLUTE COUNT: 0 10*9/L (ref 0.0–0.1)
EOSINOPHILS ABSOLUTE COUNT: 0 10*9/L (ref 0.0–0.4)
HEMATOCRIT: 35.4 % — ABNORMAL LOW (ref 36.0–46.0)
HEMOGLOBIN: 11.3 g/dL — ABNORMAL LOW (ref 12.0–16.0)
LARGE UNSTAINED CELLS: 0 % (ref 0–4)
LYMPHOCYTES ABSOLUTE COUNT: 0.1 10*9/L — ABNORMAL LOW (ref 1.5–5.0)
MEAN CORPUSCULAR HEMOGLOBIN: 30.4 pg (ref 26.0–34.0)
MEAN CORPUSCULAR VOLUME: 95.1 fL (ref 80.0–100.0)
MEAN PLATELET VOLUME: 7.5 fL (ref 7.0–10.0)
MONOCYTES ABSOLUTE COUNT: 0.3 10*9/L (ref 0.2–0.8)
NEUTROPHILS ABSOLUTE COUNT: 7.4 10*9/L (ref 2.0–7.5)
PLATELET COUNT: 322 10*9/L (ref 150–440)
RED BLOOD CELL COUNT: 3.72 10*12/L — ABNORMAL LOW (ref 4.00–5.20)
RED CELL DISTRIBUTION WIDTH: 15.6 % — ABNORMAL HIGH (ref 12.0–15.0)
WBC ADJUSTED: 7.8 10*9/L (ref 4.5–11.0)

## 2017-01-23 LAB — BASIC METABOLIC PANEL
ANION GAP: 12 mmol/L (ref 9–15)
BLOOD UREA NITROGEN: 27 mg/dL — ABNORMAL HIGH (ref 7–21)
BUN / CREAT RATIO: 24
CALCIUM: 9.7 mg/dL (ref 8.5–10.2)
CHLORIDE: 104 mmol/L (ref 98–107)
CO2: 21 mmol/L — ABNORMAL LOW (ref 22.0–30.0)
CREATININE: 1.12 mg/dL — ABNORMAL HIGH (ref 0.60–1.00)
EGFR MDRD AF AMER: >=60 mL/min/{1.73_m2} (ref >=60)
EGFR MDRD NON AF AMER: 53 mL/min/{1.73_m2} — ABNORMAL LOW (ref >=60)
GLUCOSE RANDOM: 78 mg/dL (ref 65–179)
SODIUM: 137 mmol/L (ref 135–145)

## 2017-01-23 LAB — PHOSPHORUS: Phosphate:MCnc:Pt:Ser/Plas:Qn:: 5.4 — ABNORMAL HIGH

## 2017-01-23 LAB — LARGE UNSTAINED CELLS: Lab: 0

## 2017-01-23 LAB — CREATININE: Creatinine:MCnc:Pt:Ser/Plas:Qn:: 1.12 — ABNORMAL HIGH

## 2017-01-23 LAB — MAGNESIUM: Magnesium:MCnc:Pt:Ser/Plas:Qn:: 1.8

## 2017-01-23 MED ORDER — ASPIRIN 81 MG TABLET,DELAYED RELEASE
ORAL_TABLET | Freq: Every day | ORAL | 0 refills | 0.00000 days | Status: CP
Start: 2017-01-23 — End: 2017-02-16

## 2017-01-23 MED ORDER — AMLODIPINE 5 MG TABLET: 10 mg | tablet | Freq: Every day | 11 refills | 0 days | Status: AC

## 2017-01-23 MED ORDER — AMLODIPINE 5 MG TABLET
ORAL_TABLET | Freq: Every day | ORAL | 11 refills | 0.00000 days | Status: CP
Start: 2017-01-23 — End: 2017-02-16

## 2017-01-23 NOTE — Unmapped (Signed)
TRANSPLANT SURGERY PROGRESS NOTES    Assessment/Plan:   Gina Hart is a 41 yo female s/p DCD renal transplant on 01/11/17. She is doing very well today and is accompanied by her son.      CBC WBC improved, H/H stable. Potassium elevated yesterday at 5.8, today improved at 5.5. Educated on potassium rich foods to avoid. Will follow up with subsequent labs. Creatinine slightly elevated, encouraged to continue good PO hydration. Continue 3x weekly labs.  - JP drain removed today without complication  -provided wound care education   - Refills of amlodipine, asa, and bactrim sent  - reiterated that we ask that she refrain from driving until her site it more healed and to plan to return to work after 3 months, reevaluate possibility of returning to work prior to that date at a later visit.    Subjective   Gina Hart is a 41 y.o. female with a hx of HTN, anemia, ESRD, anxiety, depression, pulmonary HTN who is s/p renal transplant on 01/11/17. Her hospital course was uneventful. She was evaluated in clinic last week and had a JP creat checked that came back normal. She returns today for follow up and JP drain removal. She states she is doing well. Denies fevers, chills, N/V, CP, SOB or leg swelling. She denies significant incisional pain and only takes pain medication sparingly. She is making, good urine about 1.5-2L of urine per day. Her JP drain is putting out minimal amounts of serous fluid, no swelling to incision site.     Objective     Vitals:    01/23/17 1224   BP: 103/60   Pulse: 76   Temp: 36.8 ??C (98.2 ??F)   TempSrc: Tympanic   Weight: 62.6 kg (138 lb)   Height: 149.9 cm (4' 11.02)      Body mass index is 27.86 kg/m??.     Physical Exam:    General Appearance:   No acute distress  Lungs:                Breathing comfortably on room air.   Heart:                           Regular rate and rhythm  Abdomen:                Soft, non-tender, non-distended, RLQ incision closed w/ staples, c/d/i. JP drain serous w/ minimal output.   Extremities:              Warm and well perfused    ___________________________        Ermalinda Barrios, DNP, APRN, FNP-C  [facility]  [address]  [place], [place]  [address]

## 2017-01-24 LAB — TACROLIMUS, TROUGH
Lab: 10.3
Lab: 13.4

## 2017-01-24 MED ORDER — PROGRAF 1 MG CAPSULE: 5 mg | capsule | Freq: Two times a day (BID) | 11 refills | 0 days | Status: AC

## 2017-01-24 MED ORDER — PROGRAF 1 MG CAPSULE
ORAL_CAPSULE | Freq: Two times a day (BID) | ORAL | 11 refills | 0.00000 days | Status: CP
Start: 2017-01-24 — End: 2017-01-30

## 2017-01-24 MED ORDER — SULFAMETHOXAZOLE 400 MG-TRIMETHOPRIM 80 MG TABLET
ORAL_TABLET | ORAL | 5 refills | 0.00000 days | Status: CP
Start: 2017-01-24 — End: 2017-02-16

## 2017-01-24 NOTE — Unmapped (Signed)
Reviewed tac with Dr. Nedra Hai.  Patient understands to hold PM dose of tac and begin 5 mg BID tomorrow AM.

## 2017-01-24 NOTE — Unmapped (Signed)
Spoke with patient to be certain she did not take her medication before her 7/30 lab draw.  Tac 13.4.  Also denies diarrhea.  Email to Dr. Nedra Hai regarding level.

## 2017-01-26 LAB — DECEASED DONOR CL I&II, LOW RES
DONOR LOW RES DRW #1: 53
DONOR LOW RES DRW #2: 53
DONOR LOW RES HLA A #1: 2
DONOR LOW RES HLA A #2: 11
DONOR LOW RES HLA B #1: 61
DONOR LOW RES HLA B #2: 44
DONOR LOW RES HLA BW #1: 6
DONOR LOW RES HLA BW #2: 4
DONOR LOW RES HLA C #1: 2
DONOR LOW RES HLA C #2: 5
DONOR LOW RES HLA DQ #1: 2
DONOR LOW RES HLA DQ #2: 7
DONOR LOW RES HLA DR #2: 7

## 2017-01-26 LAB — DONOR LOW RES HLA A #2: Lab: 11

## 2017-01-29 LAB — CBC W/ DIFFERENTIAL
BASOPHILS ABSOLUTE COUNT: 0.1 10*9/L — ABNORMAL HIGH
BASOPHILS RELATIVE PERCENT: 1.2 %
EOSINOPHILS ABSOLUTE COUNT: 0 10*9/L — ABNORMAL LOW
EOSINOPHILS RELATIVE PERCENT: 0.1 % — ABNORMAL LOW
HEMOGLOBIN: 11.7 g/dL — ABNORMAL LOW
MEAN CORPUSCULAR HEMOGLOBIN CONC: 33.6 g/dL
MEAN CORPUSCULAR HEMOGLOBIN: 31 pg
MEAN CORPUSCULAR VOLUME: 92.3 fL
MEAN PLATELET VOLUME: 7.6 fL
MONOCYTES ABSOLUTE COUNT: 0.1 10*9/L — ABNORMAL LOW
MONOCYTES RELATIVE PERCENT: 2.5 % — ABNORMAL LOW
NEUTROPHILS ABSOLUTE COUNT: 5.2 10*9/L
NEUTROPHILS RELATIVE PERCENT: 95.8 % — ABNORMAL HIGH
PLATELET COUNT: 270 10*9/L
RED BLOOD CELL COUNT: 3.8 10*12/L — ABNORMAL LOW
RED CELL DISTRIBUTION WIDTH: 15.3 % — ABNORMAL HIGH
WHITE BLOOD CELL COUNT: 5.5 10*9/L

## 2017-01-29 LAB — BASIC METABOLIC PANEL
BLOOD UREA NITROGEN: 24 mg/dL — ABNORMAL HIGH
CALCIUM: 9.4 mg/dL
CHLORIDE: 104 mmol/L
CO2: 24.8 mmol/L
CREATININE: 1.1 mg/dL
EGFR MDRD AF AMER: 66 mL/min/{1.73_m2}
GLUCOSE RANDOM: 100 mg/dL — ABNORMAL HIGH
POTASSIUM: 4.9 mmol/L
SODIUM: 138 mmol/L

## 2017-01-29 LAB — PHOSPHORUS: Lab: 4.6

## 2017-01-29 LAB — ANISOCYTOSIS: Lab: 0

## 2017-01-29 LAB — BLOOD UREA NITROGEN: Lab: 24 — ABNORMAL HIGH

## 2017-01-29 LAB — MAGNESIUM: Lab: 1.6 — ABNORMAL LOW

## 2017-01-29 NOTE — Unmapped (Signed)
Patient called asking for refills- left her a message to let me know which meds and which pharmacy

## 2017-01-30 LAB — CBC W/ DIFFERENTIAL
BASOPHILS RELATIVE PERCENT: 1 %
EOSINOPHILS ABSOLUTE COUNT: 0 10*9/L — ABNORMAL LOW
EOSINOPHILS RELATIVE PERCENT: 0.2 % — ABNORMAL LOW
HEMATOCRIT: 33.9 % — ABNORMAL LOW
HEMOGLOBIN: 11.5 g/dL — ABNORMAL LOW
LYMPHOCYTES ABSOLUTE COUNT: 0 10*9/L — ABNORMAL LOW
LYMPHOCYTES RELATIVE PERCENT: 0.7 % — ABNORMAL LOW
MEAN CORPUSCULAR HEMOGLOBIN: 30.9 pg
MEAN CORPUSCULAR VOLUME: 91.4 fL
MEAN PLATELET VOLUME: 7.6 fL
MONOCYTES ABSOLUTE COUNT: 0.1 10*9/L — ABNORMAL LOW
MONOCYTES RELATIVE PERCENT: 3.1 % — ABNORMAL LOW
NEUTROPHILS ABSOLUTE COUNT: 3.9 10*9/L
NEUTROPHILS RELATIVE PERCENT: 95 % — ABNORMAL HIGH
PLATELET COUNT: 264 10*9/L
RED BLOOD CELL COUNT: 3.7 10*12/L — ABNORMAL LOW
RED CELL DISTRIBUTION WIDTH: 14.6 %
WHITE BLOOD CELL COUNT: 4.1 10*9/L

## 2017-01-30 LAB — BASIC METABOLIC PANEL
BLOOD UREA NITROGEN: 27 mg/dL — ABNORMAL HIGH
CALCIUM: 9.9 mg/dL
CHLORIDE: 105 mmol/L
CO2: 27.7 mmol/L
GLUCOSE RANDOM: 100 mg/dL — ABNORMAL HIGH
POTASSIUM: 4.9 mmol/L
SODIUM: 138 mmol/L

## 2017-01-30 LAB — TACROLIMUS, TROUGH: Lab: 15.6

## 2017-01-30 LAB — MAGNESIUM: Lab: 1.6

## 2017-01-30 LAB — PHOSPHORUS: Lab: 4.9 — ABNORMAL HIGH

## 2017-01-30 LAB — MEAN CORPUSCULAR HEMOGLOBIN CONC: Lab: 33.8

## 2017-01-30 LAB — BLOOD UREA NITROGEN: Lab: 27 — ABNORMAL HIGH

## 2017-01-30 MED ORDER — PROGRAF 1 MG CAPSULE
ORAL_CAPSULE | Freq: Two times a day (BID) | ORAL | 11 refills | 0 days | Status: CP
Start: 2017-01-30 — End: 2017-02-05

## 2017-01-30 MED FILL — AMLODIPINE/5MG/TABS: AMLODIPINE/5MG/TABS | 30 days supply | Qty: 60 | Fill #0

## 2017-01-30 NOTE — Unmapped (Signed)
email to Dr. Nedra Hai regarding tac form 8/3.  Of note- held PM dose on 8/1 and morning of 8/2 reduced dose to 5/5.  Another level is pending from 8/6

## 2017-01-31 LAB — CBC W/ DIFFERENTIAL
BASOPHILS ABSOLUTE COUNT: 0 10*9/L — ABNORMAL LOW
BASOPHILS RELATIVE PERCENT: 0.2 %
EOSINOPHILS RELATIVE PERCENT: 0.3 % — ABNORMAL LOW
HEMATOCRIT: 33.5 % — ABNORMAL LOW
HEMOGLOBIN: 11.2 g/dL — ABNORMAL LOW
LYMPHOCYTES RELATIVE PERCENT: 0.9 % — ABNORMAL LOW
MEAN CORPUSCULAR HEMOGLOBIN CONC: 33.6 g/dL
MEAN CORPUSCULAR HEMOGLOBIN: 30.9 pg
MEAN CORPUSCULAR VOLUME: 91.9 fL
MEAN PLATELET VOLUME: 7.7 fL
MONOCYTES ABSOLUTE COUNT: 0.1 10*9/L — ABNORMAL LOW
MONOCYTES RELATIVE PERCENT: 3.2 % — ABNORMAL LOW
NEUTROPHILS ABSOLUTE COUNT: 3 10*9/L
NEUTROPHILS RELATIVE PERCENT: 95.4 % — ABNORMAL HIGH
PLATELET COUNT: 260 10*9/L
RED BLOOD CELL COUNT: 3.6 10*12/L — ABNORMAL LOW
RED CELL DISTRIBUTION WIDTH: 14.4 %

## 2017-01-31 LAB — BASIC METABOLIC PANEL
BLOOD UREA NITROGEN: 24 mg/dL — ABNORMAL HIGH
CHLORIDE: 104 mmol/L
CREATININE: 1.1 mg/dL
EGFR MDRD AF AMER: 66 mL/min/{1.73_m2}
GLUCOSE RANDOM: 97 mg/dL
POTASSIUM: 4.7 mmol/L

## 2017-01-31 LAB — TACROLIMUS, TROUGH: Lab: 11.4

## 2017-01-31 LAB — LYMPHOCYTES RELATIVE PERCENT: Lab: 0.9 — ABNORMAL LOW

## 2017-01-31 LAB — MAGNESIUM: Lab: 1.5 — ABNORMAL LOW

## 2017-01-31 LAB — POTASSIUM: Lab: 4.7

## 2017-01-31 LAB — PHOSPHORUS: Lab: 4.4

## 2017-01-31 NOTE — Unmapped (Signed)
Reviewed tac with Dr. Nedra Hai.  Patient understands to reduce prograf to 4 mg AM and PM beginning this evening

## 2017-02-02 LAB — CBC W/ DIFFERENTIAL
BASOPHILS ABSOLUTE COUNT: 0 10*9/L — ABNORMAL LOW
BASOPHILS RELATIVE PERCENT: 0.5 %
EOSINOPHILS ABSOLUTE COUNT: 0 10*9/L — ABNORMAL LOW
EOSINOPHILS RELATIVE PERCENT: 0.4 % — ABNORMAL LOW
HEMATOCRIT: 31.5 % — ABNORMAL LOW
HEMOGLOBIN: 10.7 g/dL — ABNORMAL LOW
LYMPHOCYTES ABSOLUTE COUNT: 0 10*9/L — ABNORMAL LOW
LYMPHOCYTES RELATIVE PERCENT: 1 % — ABNORMAL LOW
MEAN CORPUSCULAR HEMOGLOBIN CONC: 33.8 g/dL
MEAN CORPUSCULAR HEMOGLOBIN: 31.2 pg
MEAN CORPUSCULAR VOLUME: 92.4 fL
MEAN PLATELET VOLUME: 7.7 fL
MONOCYTES ABSOLUTE COUNT: 0.1 10*9/L — ABNORMAL LOW
MONOCYTES RELATIVE PERCENT: 3.7 % — ABNORMAL LOW
NEUTROPHILS ABSOLUTE COUNT: 2.7 10*9/L
PLATELET COUNT: 264 10*9/L
RED BLOOD CELL COUNT: 3.4 10*12/L — ABNORMAL LOW
RED CELL DISTRIBUTION WIDTH: 14.2 %
WBC ADJUSTED: 2.9 10*9/L — ABNORMAL LOW

## 2017-02-02 LAB — BASIC METABOLIC PANEL
BLOOD UREA NITROGEN: 18 mg/dL — ABNORMAL HIGH
CALCIUM: 9.6 mg/dL
CHLORIDE: 104 mmol/L
CO2: 24.5 mmol/L
CREATININE: 1 mg/dL
POTASSIUM: 5 mmol/L
SODIUM: 135 mmol/L — ABNORMAL LOW

## 2017-02-02 LAB — CO2: Lab: 24.5

## 2017-02-02 LAB — MAGNESIUM: Lab: 1.6 — ABNORMAL LOW

## 2017-02-02 LAB — RED BLOOD CELL COUNT: Lab: 3.4 — ABNORMAL LOW

## 2017-02-02 LAB — PHOSPHORUS: Lab: 4.4

## 2017-02-05 LAB — BASIC METABOLIC PANEL
BLOOD UREA NITROGEN: 23 mg/dL — ABNORMAL HIGH
CO2: 21.6 mmol/L
CREATININE: 1.2 mg/dL
EGFR MDRD AF AMER: 60 mL/min/{1.73_m2}
POTASSIUM: 4.2 mmol/L
SODIUM: 136 mmol/L

## 2017-02-05 LAB — CBC W/ DIFFERENTIAL
BASOPHILS ABSOLUTE COUNT: 0 10*9/L — ABNORMAL LOW
BASOPHILS RELATIVE PERCENT: 1.7 %
EOSINOPHILS ABSOLUTE COUNT: 0 10*9/L — ABNORMAL LOW
EOSINOPHILS RELATIVE PERCENT: 0.7 % — ABNORMAL LOW
HEMATOCRIT: 34.9 % — ABNORMAL LOW
HEMOGLOBIN: 11.8 g/dL — ABNORMAL LOW
LYMPHOCYTES ABSOLUTE COUNT: 0 10*9/L — ABNORMAL LOW
LYMPHOCYTES RELATIVE PERCENT: 1 % — ABNORMAL LOW
MEAN CORPUSCULAR HEMOGLOBIN CONC: 33.8 g/dL
MEAN CORPUSCULAR VOLUME: 92.7 fL
MEAN PLATELET VOLUME: 8.1 fL
MONOCYTES ABSOLUTE COUNT: 0.1 10*9/L — ABNORMAL LOW
MONOCYTES RELATIVE PERCENT: 2.4 % — ABNORMAL LOW
NEUTROPHILS ABSOLUTE COUNT: 2.7 10*9/L
NEUTROPHILS RELATIVE PERCENT: 94.2 % — ABNORMAL HIGH
PLATELET COUNT: 257 10*9/L
RED BLOOD CELL COUNT: 3.8 10*12/L — ABNORMAL LOW
RED CELL DISTRIBUTION WIDTH: 14.1 %
WBC ADJUSTED: 2.9 10*9/L — ABNORMAL LOW

## 2017-02-05 LAB — PHOSPHORUS: Lab: 4.8 — ABNORMAL HIGH

## 2017-02-05 LAB — MAGNESIUM: Lab: 1.7 — ABNORMAL LOW

## 2017-02-05 LAB — EGFR MDRD NON AF AMER: Lab: 0

## 2017-02-05 LAB — TACROLIMUS, TROUGH
Lab: 11.6
Lab: 18.7

## 2017-02-05 LAB — MEAN PLATELET VOLUME: Lab: 8.1

## 2017-02-05 MED ORDER — PROGRAF 1 MG CAPSULE
ORAL_CAPSULE | 11 refills | 0 days | Status: CP
Start: 2017-02-05 — End: 2017-02-16

## 2017-02-05 MED FILL — SMZ-TMP/400-80MG/TAB: SMZ-TMP/400-80MG/TAB | 28 days supply | Qty: 12 | Fill #0

## 2017-02-05 NOTE — Unmapped (Signed)
Reviewed tac with Dr. Nedra Hai.  Patient states she has had large soft to loose stools; advised to stop all miralax (took Saturday) and colace.  Will increase fluid intake as well. PAtient to hold tomnight and tomorrow AM dose and begin tomorrow evening with 2 mg dose.  Following 3 mg AM and 2 mg PM

## 2017-02-08 LAB — CBC W/ DIFFERENTIAL
BASOPHILS ABSOLUTE COUNT: 0 10*9/L — ABNORMAL LOW
BASOPHILS RELATIVE PERCENT: 0.4 %
EOSINOPHILS ABSOLUTE COUNT: 0 10*9/L — ABNORMAL LOW
EOSINOPHILS RELATIVE PERCENT: 0.8 % — ABNORMAL LOW
HEMATOCRIT: 32.3 % — ABNORMAL LOW
HEMOGLOBIN: 11 g/dL — ABNORMAL LOW
LYMPHOCYTES ABSOLUTE COUNT: 0 10*9/L — ABNORMAL LOW
LYMPHOCYTES RELATIVE PERCENT: 0.8 % — ABNORMAL LOW
MEAN CORPUSCULAR HEMOGLOBIN CONC: 34 g/dL
MEAN CORPUSCULAR HEMOGLOBIN: 31.2 pg
MEAN CORPUSCULAR VOLUME: 91.8 fL
MEAN PLATELET VOLUME: 7.6 fL
MONOCYTES ABSOLUTE COUNT: 0.1 10*9/L — ABNORMAL LOW
MONOCYTES RELATIVE PERCENT: 4.1 %
NEUTROPHILS ABSOLUTE COUNT: 2.8 10*9/L
NEUTROPHILS RELATIVE PERCENT: 93.9 % — ABNORMAL HIGH
RED BLOOD CELL COUNT: 3.5 10*12/L — ABNORMAL LOW
RED CELL DISTRIBUTION WIDTH: 13.8 %
WBC ADJUSTED: 3 10*9/L — ABNORMAL LOW

## 2017-02-08 LAB — BASIC METABOLIC PANEL
BLOOD UREA NITROGEN: 22 mg/dL — ABNORMAL HIGH
CALCIUM: 9.9 mg/dL
CHLORIDE: 103 mmol/L
CO2: 27.2 mmol/L
CREATININE: 1.2 mg/dL
EGFR MDRD AF AMER: 60 mL/min/{1.73_m2}
GLUCOSE RANDOM: 107 mg/dL — ABNORMAL HIGH
SODIUM: 137 mmol/L

## 2017-02-08 LAB — TACROLIMUS, TROUGH: Lab: 13.8

## 2017-02-08 LAB — PHOSPHORUS: Lab: 4.2

## 2017-02-08 LAB — MEAN CORPUSCULAR VOLUME: Lab: 91.8

## 2017-02-08 LAB — MAGNESIUM: Lab: 1.6 — ABNORMAL LOW

## 2017-02-08 LAB — EGFR MDRD AF AMER: Lab: 60

## 2017-02-12 LAB — CBC W/ DIFFERENTIAL
BASOPHILS ABSOLUTE COUNT: 0 10*9/L — ABNORMAL LOW
EOSINOPHILS ABSOLUTE COUNT: 0 10*9/L — ABNORMAL LOW
EOSINOPHILS RELATIVE PERCENT: 1 %
HEMOGLOBIN: 10.5 g/dL — ABNORMAL LOW
LYMPHOCYTES ABSOLUTE COUNT: 0 10*9/L — ABNORMAL LOW
LYMPHOCYTES RELATIVE PERCENT: 1.4 % — ABNORMAL LOW
MEAN CORPUSCULAR HEMOGLOBIN CONC: 33.7 g/dL
MEAN CORPUSCULAR HEMOGLOBIN: 31.1 pg
MEAN CORPUSCULAR VOLUME: 92.3 fL
MEAN PLATELET VOLUME: 7.7 fL
MONOCYTES ABSOLUTE COUNT: 0.1 10*9/L — ABNORMAL LOW
MONOCYTES RELATIVE PERCENT: 4.9 %
NEUTROPHILS ABSOLUTE COUNT: 2.1 10*9/L
NEUTROPHILS RELATIVE PERCENT: 92.1 % — ABNORMAL HIGH
PLATELET COUNT: 258 10*9/L
RED BLOOD CELL COUNT: 3.4 10*12/L — ABNORMAL LOW
RED CELL DISTRIBUTION WIDTH: 13.7 %
WHITE BLOOD CELL COUNT: 2.3 10*9/L — ABNORMAL LOW

## 2017-02-12 LAB — BASIC METABOLIC PANEL
CALCIUM: 9.7 mg/dL
CHLORIDE: 105 mmol/L
CO2: 26.8 mmol/L
EGFR MDRD AF AMER: 74 mL/min/{1.73_m2}
GLUCOSE RANDOM: 99 mg/dL
POTASSIUM: 4.6 mmol/L

## 2017-02-12 LAB — MAGNESIUM: Lab: 1.6 — ABNORMAL LOW

## 2017-02-12 LAB — TACROLIMUS, TROUGH: Lab: 7.3

## 2017-02-12 LAB — MEAN CORPUSCULAR HEMOGLOBIN: Lab: 31.1

## 2017-02-12 LAB — BLOOD UREA NITROGEN: Lab: 20 — ABNORMAL HIGH

## 2017-02-12 LAB — PHOSPHORUS: Lab: 4.6

## 2017-02-13 LAB — CBC W/ DIFFERENTIAL
BASOPHILS ABSOLUTE COUNT: 0 10*9/L — ABNORMAL LOW
BASOPHILS RELATIVE PERCENT: 0.2 %
EOSINOPHILS ABSOLUTE COUNT: 0 10*9/L — ABNORMAL LOW
EOSINOPHILS RELATIVE PERCENT: 0.9 % — ABNORMAL LOW
HEMATOCRIT: 32.6 % — ABNORMAL LOW
HEMOGLOBIN: 11.2 g/dL — ABNORMAL LOW
LYMPHOCYTES ABSOLUTE COUNT: 0 10*9/L — ABNORMAL LOW
LYMPHOCYTES RELATIVE PERCENT: 1 % — ABNORMAL LOW
MEAN CORPUSCULAR HEMOGLOBIN CONC: 34.4 g/dL
MEAN CORPUSCULAR HEMOGLOBIN: 31.5 pg
MEAN PLATELET VOLUME: 7.6 fL
MONOCYTES ABSOLUTE COUNT: 0.1 10*9/L — ABNORMAL LOW
MONOCYTES RELATIVE PERCENT: 3.7 % — ABNORMAL LOW
NEUTROPHILS ABSOLUTE COUNT: 2.7 10*9/L
PLATELET COUNT: 252 10*9/L
RED BLOOD CELL COUNT: 3.6 10*12/L — ABNORMAL LOW
RED CELL DISTRIBUTION WIDTH: 14.1 %
WHITE BLOOD CELL COUNT: 2.9 10*9/L — ABNORMAL LOW

## 2017-02-13 LAB — BASIC METABOLIC PANEL
BLOOD UREA NITROGEN: 16 mg/dL
CALCIUM: 9.8 mg/dL
CHLORIDE: 105 mmol/L
CO2: 25.4 mmol/L
CREATININE: 1 mg/dL
EGFR MDRD AF AMER: 74 mL/min/{1.73_m2}
POTASSIUM: 4.7 mmol/L

## 2017-02-13 LAB — MAGNESIUM: Lab: 1.5 — ABNORMAL LOW

## 2017-02-13 LAB — EOSINOPHILS ABSOLUTE COUNT: Lab: 0 — ABNORMAL LOW

## 2017-02-13 LAB — SODIUM: Lab: 138

## 2017-02-13 LAB — PHOSPHORUS: Lab: 4.3

## 2017-02-13 MED FILL — MYFORTIC/180MG/TAB: MYFORTIC/180MG/TAB | 30 days supply | Qty: 180 | Fill #0

## 2017-02-13 MED FILL — VALGANCICLOVIR/450MG/TABS: VALGANCICLOVIR/450MG/TABS | 30 days supply | Qty: 60 | Fill #0

## 2017-02-13 NOTE — Unmapped (Signed)
Transplant Onboarding / Counseling    Date: 02/13/2017    Patient: Gina Hart    s/p kidney transplant on 01/11/17    Education Provided: ??    ? Verified patient's date of birth / HIPAA.    ? Explained the services we provide at [facility] and that each month we would call to set up refills.  Stressed importance of returning phone calls so that we could ensure they receive their medications in time each month.  Informed patient that we should be setting up refills 7-10 days prior to when they will run out of medication.  Informed patient that welcome packet will be sent.      ? Patient speaks  [place].    ? Patient has no physical or cognitive barriers.    ? Patient prefers to have medications discussed with  Patient     ? Patient is able to read and understand education materials at a high school level or above.    ? Patient declined counseling on transplant medications.    ? Stressed the importance of medication adherence to transplant outcomes.      We set up delivery of her Myfortic - (3 tabs BID, Last filled 7/23 at [facility], and patient has 9 tabs remaining) and her Valcyte - (2 tabs QDay, Last filled 7/23 at [facility], patient has 4 tabs remaining). She knows she will need to sign for medications, and they should arrive tomorrow, 8/22.       The patient demonstrated a good understanding of the discharge medications.  All questions and concerns were answered.    Thank you for your time today. We are here as a team to support you during this treatment and welcome any questions and concerns that you have. Remember we have pharmacists available 24 hours a day, seven days a week at our toll free number, [phone], to answer any questions or concerns that you may have. Please call us at any time if you need assistance.    Nuriya Stuck A. Katrinka Blazing, PharmD - Pharmacist   [facility]   [address] [place], [place] [address]   t [phone] - f [phone]

## 2017-02-14 LAB — TACROLIMUS, TROUGH: Lab: 6.8

## 2017-02-15 ENCOUNTER — Ambulatory Visit: Admission: RE | Admit: 2017-02-15 | Discharge: 2017-02-15 | Disposition: A | Discharge status: Home / Self Care

## 2017-02-15 DIAGNOSIS — Z940 Kidney transplant status: Principal | ICD-10-CM

## 2017-02-15 LAB — CBC W/ DIFFERENTIAL
BASOPHILS ABSOLUTE COUNT: 0 10*9/L — ABNORMAL LOW
BASOPHILS RELATIVE PERCENT: 0.1 %
EOSINOPHILS RELATIVE PERCENT: 0.7 % — ABNORMAL LOW
HEMATOCRIT: 32 % — ABNORMAL LOW
HEMOGLOBIN: 11 g/dL — ABNORMAL LOW
LYMPHOCYTES ABSOLUTE COUNT: 0 10*9/L — ABNORMAL LOW
LYMPHOCYTES RELATIVE PERCENT: 1 % — ABNORMAL LOW
MEAN CORPUSCULAR HEMOGLOBIN CONC: 34.4 g/dL
MEAN CORPUSCULAR HEMOGLOBIN: 31.4 pg
MEAN CORPUSCULAR VOLUME: 91.5 fL
MEAN PLATELET VOLUME: 7.7 fL
MONOCYTES ABSOLUTE COUNT: 0.1 10*9/L — ABNORMAL LOW
MONOCYTES RELATIVE PERCENT: 4 %
NEUTROPHILS ABSOLUTE COUNT: 2.9 10*9/L
NEUTROPHILS RELATIVE PERCENT: 94.2 % — ABNORMAL HIGH
PLATELET COUNT: 237 10*9/L
RED BLOOD CELL COUNT: 3.5 10*12/L — ABNORMAL LOW
RED CELL DISTRIBUTION WIDTH: 13.9 %

## 2017-02-15 LAB — BASIC METABOLIC PANEL
BLOOD UREA NITROGEN: 17 mg/dL
CALCIUM: 9.6 mg/dL
CHLORIDE: 105 mmol/L
CO2: 25.1 mmol/L
GLUCOSE RANDOM: 99 mg/dL
POTASSIUM: 4.4 mmol/L
SODIUM: 138 mmol/L

## 2017-02-15 LAB — SODIUM: Lab: 138

## 2017-02-15 LAB — LYMPHOCYTES RELATIVE PERCENT: Lab: 1 — ABNORMAL LOW

## 2017-02-15 LAB — PHOSPHORUS: Lab: 4.7

## 2017-02-15 LAB — MAGNESIUM: Lab: 1.6 — ABNORMAL LOW

## 2017-02-15 LAB — TACROLIMUS, TROUGH: Lab: 9.7

## 2017-02-15 NOTE — Unmapped (Signed)
White cystoscope used for procedure. Darleen Crocker, CMA

## 2017-02-15 NOTE — Unmapped (Signed)
[facility]:  Taking Care of Yourself After Cystoscopy Procedures    *Drink plenty of water for a day or two following your procedure.  Try to have about 8 ounces (one cup) at a time, and do this 6 times or more per day.  (If you have fluid restrictions, please ask the nurse or doctor for advice).    *AVOID alcoholic, carbonated and caffeinated drinks for a day or two, as they may cause uncomfortable symptoms.    *For the first 8 hours after the procedure, your urine may be pink or red in color.  Small clots or a few drops of blood can be a normal side effect of the instruments.  Large amounts of bleeding or difficulty urinating are not normal.  Call your doctor if this happens.    *You may experience some mild discomfort of a burning sensation with urination after having this procedure.  If it does not improve, or if other symptoms appear (fever, chills, or difficulty emptying), call your doctor.    *You may return to normal daily activities such as work, school, driving, exercising and housework.    *If your doctor gave you a prescription, take it as ordered.    *If you need a return appointment, the secretary will make it for you when you check out. To contact the Urology Clinic during business hours, call [phone].    *[facility] Operator can be reached at [phone] if you need to get in contact with your doctor.  After the hours, the operator can page the doctor on call for urgent concerns:    Urology Patients should ask for the Urology resident “on call”.    You can get more immediate assistance at the Emergency Room or Urgent Care if necessary.

## 2017-02-15 NOTE — Unmapped (Signed)
Indications:   41 y.o. female who recently underwent renal transplant 01/11/2017 and continues to have an indwelling transplant JJ ureteral stent. They present today for ureteral stent removal.     Procedure:  Flexible Cystoscopy with stent removal ([address])    Timeout: The correct patient, procedure and participants were identified.  Antibiotics were given (bactrim).     Description:  The patient was prepped and draped in the usual sterile fashion. Flexible cystourethroscopy was performed.  The transplant ureteral stent was visualized, grasped, and removed intact without difficulty. The patient tolerated the procedure well.    Complications:  None    Plan:   - Follow up urology PRN  - Recommended patient contact transplant coordinator regarding final staple removal plan. We paged transplant coordinator as well to discuss this.      Jalene Mullet, MD  Resident, Department of Urology

## 2017-02-16 LAB — CBC W/ DIFFERENTIAL
BASOPHILS RELATIVE PERCENT: 0.4 %
EOSINOPHILS ABSOLUTE COUNT: 0 10*9/L — ABNORMAL LOW
EOSINOPHILS RELATIVE PERCENT: 1 %
HEMATOCRIT: 32.6 % — ABNORMAL LOW
HEMOGLOBIN: 11.2 g/dL — ABNORMAL LOW
LYMPHOCYTES ABSOLUTE COUNT: 0 10*9/L — ABNORMAL LOW
LYMPHOCYTES RELATIVE PERCENT: 0.9 % — ABNORMAL LOW
MEAN CORPUSCULAR HEMOGLOBIN CONC: 34.3 g/dL
MEAN CORPUSCULAR HEMOGLOBIN: 31.4 pg
MEAN CORPUSCULAR VOLUME: 91.6 fL
MEAN PLATELET VOLUME: 7.8 fL
MONOCYTES ABSOLUTE COUNT: 0.2 10*9/L
MONOCYTES RELATIVE PERCENT: 5 %
NEUTROPHILS ABSOLUTE COUNT: 2.8 10*9/L
NEUTROPHILS RELATIVE PERCENT: 92.7 % — ABNORMAL HIGH
PLATELET COUNT: 239 10*9/L
RED BLOOD CELL COUNT: 3.6 10*12/L — ABNORMAL LOW
RED CELL DISTRIBUTION WIDTH: 14.1 %
WHITE BLOOD CELL COUNT: 3.1 10*9/L — ABNORMAL LOW

## 2017-02-16 LAB — BASIC METABOLIC PANEL
BLOOD UREA NITROGEN: 18 mg/dL
CALCIUM: 9.5 mg/dL
CHLORIDE: 104 mmol/L
CO2: 25.1 mmol/L
CREATININE: 1.1 mg/dL
EGFR MDRD AF AMER: 66 mL/min/{1.73_m2}
GLUCOSE RANDOM: 101 mg/dL — ABNORMAL HIGH

## 2017-02-16 LAB — GLUCOSE RANDOM: Lab: 101 — ABNORMAL HIGH

## 2017-02-16 LAB — MAGNESIUM
Lab: 1.5 — ABNORMAL LOW
MAGNESIUM: 1.5 mg/dL — ABNORMAL LOW

## 2017-02-16 LAB — PHOSPHORUS: Lab: 4.5

## 2017-02-16 LAB — BASOPHILS RELATIVE PERCENT: Lab: 0.4

## 2017-02-16 MED ORDER — SERTRALINE 100 MG TABLET
ORAL_TABLET | Freq: Every day | ORAL | 3 refills | 0.00000 days | Status: CP
Start: 2017-02-16 — End: 2017-02-16

## 2017-02-16 MED ORDER — PROGRAF 1 MG CAPSULE: capsule | 11 refills | 0 days | Status: AC

## 2017-02-16 MED ORDER — ASPIRIN 81 MG TABLET,DELAYED RELEASE
ORAL_TABLET | Freq: Every day | ORAL | 0 refills | 0.00000 days | Status: CP
Start: 2017-02-16 — End: 2017-02-16

## 2017-02-16 MED ORDER — SULFAMETHOXAZOLE 400 MG-TRIMETHOPRIM 80 MG TABLET
ORAL_TABLET | ORAL | 5 refills | 0.00000 days | Status: CP
Start: 2017-02-16 — End: 2017-02-16

## 2017-02-16 MED ORDER — MYFORTIC 180 MG TABLET,DELAYED RELEASE: tablet | 11 refills | 0 days

## 2017-02-16 MED ORDER — AMLODIPINE 5 MG TABLET
ORAL_TABLET | Freq: Every day | ORAL | 11 refills | 0.00000 days | Status: CP
Start: 2017-02-16 — End: 2017-02-28

## 2017-02-16 MED ORDER — AMLODIPINE 5 MG TABLET: 10 mg | tablet | Freq: Every day | 11 refills | 0 days | Status: AC

## 2017-02-16 MED ORDER — SERTRALINE 100 MG TABLET: tablet | 3 refills | 0 days

## 2017-02-16 MED ORDER — TRAZODONE 50 MG TABLET
ORAL_TABLET | Freq: Every evening | ORAL | 1 refills | 0.00000 days | Status: CP
Start: 2017-02-16 — End: 2017-06-11

## 2017-02-16 MED ORDER — SULFAMETHOXAZOLE 400 MG-TRIMETHOPRIM 80 MG TABLET: tablet | 5 refills | 0 days

## 2017-02-16 MED ORDER — PROGRAF 1 MG CAPSULE
ORAL_CAPSULE | ORAL | 11 refills | 0.00000 days | Status: CP
Start: 2017-02-16 — End: 2017-02-16

## 2017-02-16 MED ORDER — VALGANCICLOVIR 450 MG TABLET: tablet | 5 refills | 0 days

## 2017-02-16 MED ORDER — TRAZODONE 50 MG TABLET: 50 mg | tablet | Freq: Every evening | 1 refills | 0 days | Status: AC

## 2017-02-16 MED ORDER — MYFORTIC 180 MG TABLET,DELAYED RELEASE
ORAL_TABLET | Freq: Two times a day (BID) | ORAL | 11 refills | 0.00000 days | Status: CP
Start: 2017-02-16 — End: 2017-02-16

## 2017-02-16 MED ORDER — VALGANCICLOVIR 450 MG TABLET
ORAL_TABLET | Freq: Every day | ORAL | 5 refills | 0.00000 days | Status: CP
Start: 2017-02-16 — End: 2017-06-11

## 2017-02-16 MED ORDER — ASPIRIN 81 MG TABLET,DELAYED RELEASE: tablet | 0 refills | 0 days

## 2017-02-19 LAB — TACROLIMUS, TROUGH: Lab: 6.7

## 2017-02-20 LAB — TACROLIMUS, TROUGH: Lab: 9.1

## 2017-02-20 LAB — CBC W/ DIFFERENTIAL
BASOPHILS ABSOLUTE COUNT: 0 10*9/L — ABNORMAL LOW
BASOPHILS RELATIVE PERCENT: 0.2 %
EOSINOPHILS ABSOLUTE COUNT: 0 10*9/L — ABNORMAL LOW
EOSINOPHILS RELATIVE PERCENT: 1.4 %
HEMATOCRIT: 35.8 %
HEMOGLOBIN: 11.9 g/dL
LYMPHOCYTES ABSOLUTE COUNT: 0.1 10*9/L — ABNORMAL LOW
MEAN CORPUSCULAR HEMOGLOBIN CONC: 33.2 g/dL — ABNORMAL LOW
MEAN CORPUSCULAR HEMOGLOBIN: 31.1 pg
MEAN CORPUSCULAR VOLUME: 93.8 fL
MONOCYTES ABSOLUTE COUNT: 0.2 10*9/L
MONOCYTES RELATIVE PERCENT: 7.7 %
NEUTROPHILS ABSOLUTE COUNT: 2.7 10*9/L
NEUTROPHILS RELATIVE PERCENT: 88.9 % — ABNORMAL HIGH
PLATELET COUNT: 206 10*9/L
RED BLOOD CELL COUNT: 3.8 10*12/L — ABNORMAL LOW
RED CELL DISTRIBUTION WIDTH: 13.9 %
WHITE BLOOD CELL COUNT: 3 10*9/L — ABNORMAL LOW

## 2017-02-20 LAB — BASIC METABOLIC PANEL
CALCIUM: 9.8 mg/dL
CHLORIDE: 101 mmol/L
CO2: 22.7 mmol/L
EGFR MDRD AF AMER: 74 mL/min/{1.73_m2}
GLUCOSE RANDOM: 88 mg/dL
POTASSIUM: 4.5 mmol/L
SODIUM: 132 mmol/L — ABNORMAL LOW

## 2017-02-20 LAB — POTASSIUM: Lab: 4.5

## 2017-02-20 LAB — MAGNESIUM
Lab: 1.5 — ABNORMAL LOW
MAGNESIUM: 1.5 mg/dL — ABNORMAL LOW

## 2017-02-20 LAB — PHOSPHORUS: Lab: 4.5

## 2017-02-20 LAB — HYPERCHROMASIA: Lab: 0

## 2017-02-20 MED FILL — PROGRAF/1MG/CAP: PROGRAF/1MG/CAP | 30 days supply | Qty: 150 | Fill #0

## 2017-02-20 NOTE — Unmapped (Addendum)
[facility] Refill and Clinical Coordination Note  Medication(s): Prograf 1mg     Gina Hart, DOB: 11/22/1975  Phone: [phone] (home) , Alternate phone contact: N/A  Shipping address: [address]  [place] [address]  Phone or address changes today?: No  All above HIPAA information verified.  Insurance changes? No    Completed refill and clinical call assessment today to schedule patient's medication shipment from the [facility] [phone]).      MEDICATION RECONCILIATION    Confirmed the medication and dosage are correct and have not changed: No, patient reports changes to the regimen as follows: Prograf dose is now 3mg  am and 2 mg pm    Were there any changes to your medication(s) in the past month:  No, there are no changes reported at this time.    ADHERENCE    Prograf 1 mg   Quantity filled last month: 300   # of capsules  left on hand: 15             Did you miss any doses in the past 4 weeks? No missed doses reported.  Adherence counseling provided? Not needed     SIDE EFFECT MANAGEMENT    Are you tolerating your medication?:  Gina Hart reports tolerating the medication.  Side effect management discussed: None      Therapy is appropriate and should be continued.        FINANCIAL/SHIPPING    Delivery Scheduled: Yes, Expected medication delivery date: 02/21/17   Additional medications refilled: No additional medications/refills needed at this time.    Gina Hart did not have any additional questions at this time.    Delivery address validated in [facility] scheduling system: Yes, address listed above is correct.      We will follow up with patient monthly for standard refill processing and delivery.      Thank you,  Roderic Palau   [facility] Specialty Pharmacist

## 2017-02-21 LAB — CBC W/ DIFFERENTIAL
BASOPHILS ABSOLUTE COUNT: 0 10*9/L — ABNORMAL LOW
BASOPHILS RELATIVE PERCENT: 0.1 %
EOSINOPHILS ABSOLUTE COUNT: 0 10*9/L — ABNORMAL LOW
EOSINOPHILS RELATIVE PERCENT: 1.7 %
HEMATOCRIT: 31.8 % — ABNORMAL LOW
HEMOGLOBIN: 10.8 g/dL — ABNORMAL LOW
LYMPHOCYTES ABSOLUTE COUNT: 0 10*9/L — ABNORMAL LOW
LYMPHOCYTES RELATIVE PERCENT: 1.3 % — ABNORMAL LOW
MEAN CORPUSCULAR HEMOGLOBIN CONC: 33.9 g/dL
MEAN CORPUSCULAR VOLUME: 92.8 fL
MONOCYTES ABSOLUTE COUNT: 0.2 10*9/L
MONOCYTES RELATIVE PERCENT: 6 %
NEUTROPHILS ABSOLUTE COUNT: 2.6 10*9/L
NEUTROPHILS RELATIVE PERCENT: 90.9 % — ABNORMAL HIGH
PLATELET COUNT: 208 10*9/L
RED CELL DISTRIBUTION WIDTH: 14.3 %
WHITE BLOOD CELL COUNT: 2.9 10*9/L — ABNORMAL LOW

## 2017-02-21 LAB — BASIC METABOLIC PANEL
BLOOD UREA NITROGEN: 20 mg/dL — ABNORMAL HIGH
CALCIUM: 9.7 mg/dL
CHLORIDE: 101 mmol/L
CO2: 26 mmol/L
EGFR MDRD AF AMER: 74 mL/min/{1.73_m2}
GLUCOSE RANDOM: 97 mg/dL
POTASSIUM: 4.5 mmol/L
SODIUM: 133 mmol/L — ABNORMAL LOW

## 2017-02-21 LAB — RED BLOOD CELL COUNT: Lab: 3.4 — ABNORMAL LOW

## 2017-02-21 LAB — TACROLIMUS, TROUGH: Lab: 7.1

## 2017-02-21 LAB — PHOSPHORUS: Lab: 4.3

## 2017-02-21 LAB — SODIUM: Lab: 133 — ABNORMAL LOW

## 2017-02-21 LAB — MAGNESIUM: Lab: 1.5 — ABNORMAL LOW

## 2017-02-22 MED ORDER — PROGRAF 1 MG CAPSULE
ORAL_CAPSULE | Freq: Two times a day (BID) | ORAL | 11 refills | 0 days | Status: CP
Start: 2017-02-22 — End: 2017-05-04

## 2017-02-22 NOTE — Unmapped (Signed)
Reviewed tac with Dr. Roselee Culver.  Patient understands to increase dose to 3 mg BID

## 2017-02-26 NOTE — Unmapped (Addendum)
Duplicate note

## 2017-02-27 ENCOUNTER — Ambulatory Visit
Admission: RE | Admit: 2017-02-27 | Discharge: 2017-02-27 | Disposition: A | Discharge status: Home / Self Care | Admitting: Pharmacist Clinician (PhC)/ Clinical Pharmacy Specialist

## 2017-02-27 ENCOUNTER — Ambulatory Visit
Admission: RE | Admit: 2017-02-27 | Discharge: 2017-02-27 | Disposition: A | Discharge status: Home / Self Care | Admitting: Nephrology

## 2017-02-27 ENCOUNTER — Ambulatory Visit: Admission: RE | Admit: 2017-02-27 | Discharge: 2017-02-27 | Disposition: A | Discharge status: Home / Self Care

## 2017-02-27 ENCOUNTER — Ambulatory Visit
Admission: RE | Admit: 2017-02-27 | Discharge: 2017-02-27 | Disposition: A | Discharge status: Home / Self Care | Payer: MEDICARE | Attending: Registered" | Admitting: Registered"

## 2017-02-27 DIAGNOSIS — Z940 Kidney transplant status: Principal | ICD-10-CM

## 2017-02-27 LAB — CBC W/ AUTO DIFF
EOSINOPHILS ABSOLUTE COUNT: 0.1 10*9/L (ref 0.0–0.4)
HEMOGLOBIN: 11 g/dL — ABNORMAL LOW (ref 12.0–16.0)
LARGE UNSTAINED CELLS: 1 % (ref 0–4)
LYMPHOCYTES ABSOLUTE COUNT: 0.1 10*9/L — ABNORMAL LOW (ref 1.5–5.0)
MEAN CORPUSCULAR HEMOGLOBIN CONC: 33.1 g/dL (ref 31.0–37.0)
MEAN CORPUSCULAR HEMOGLOBIN: 31.8 pg (ref 26.0–34.0)
MEAN CORPUSCULAR VOLUME: 96.4 fL (ref 80.0–100.0)
MEAN PLATELET VOLUME: 8 fL (ref 7.0–10.0)
MONOCYTES ABSOLUTE COUNT: 0.1 10*9/L — ABNORMAL LOW (ref 0.2–0.8)
NEUTROPHILS ABSOLUTE COUNT: 2.9 10*9/L (ref 2.0–7.5)
PLATELET COUNT: 223 10*9/L (ref 150–440)
RED BLOOD CELL COUNT: 3.45 10*12/L — ABNORMAL LOW (ref 4.00–5.20)
RED CELL DISTRIBUTION WIDTH: 14.6 % (ref 12.0–15.0)
WBC ADJUSTED: 3.2 10*9/L — ABNORMAL LOW (ref 4.5–11.0)

## 2017-02-27 LAB — HEMOGLOBIN A1C
ESTIMATED AVERAGE GLUCOSE: 100 mg/dL
Hemoglobin A1c/Hemoglobin.total:MFr:Pt:Bld:Qn:: 5.1

## 2017-02-27 LAB — HEMOGLOBIN: Lab: 10.8 — ABNORMAL LOW

## 2017-02-27 LAB — COMPREHENSIVE METABOLIC PANEL
ALBUMIN: 4.2 g/dL (ref 3.5–5.0)
ALKALINE PHOSPHATASE: 133 U/L — ABNORMAL HIGH (ref 38–126)
ALT (SGPT): 71 U/L — ABNORMAL HIGH (ref 15–48)
ANION GAP: 11 mmol/L (ref 9–15)
BILIRUBIN TOTAL: 0.3 mg/dL (ref 0.0–1.2)
BLOOD UREA NITROGEN: 23 mg/dL — ABNORMAL HIGH (ref 7–21)
BUN / CREAT RATIO: 24
CALCIUM: 9.9 mg/dL (ref 8.5–10.2)
CHLORIDE: 102 mmol/L (ref 98–107)
CO2: 25 mmol/L (ref 22.0–30.0)
CREATININE: 0.96 mg/dL (ref 0.60–1.00)
EGFR MDRD AF AMER: >=60 mL/min/{1.73_m2} (ref >=60)
EGFR MDRD NON AF AMER: >=60 mL/min/{1.73_m2} (ref >=60)
GLUCOSE RANDOM: 92 mg/dL (ref 65–179)
POTASSIUM: 5 mmol/L (ref 3.5–5.0)
PROTEIN TOTAL: 7.4 g/dL (ref 6.5–8.3)
SODIUM: 138 mmol/L (ref 135–145)

## 2017-02-27 LAB — CBC W/ DIFFERENTIAL
BASOPHILS ABSOLUTE COUNT: 0 10*9/L — ABNORMAL LOW
BASOPHILS ABSOLUTE COUNT: 0 10*9/L — ABNORMAL LOW
BASOPHILS RELATIVE PERCENT: 0.4 %
EOSINOPHILS ABSOLUTE COUNT: 0.1 10*9/L
EOSINOPHILS ABSOLUTE COUNT: 0.1 10*9/L
EOSINOPHILS RELATIVE PERCENT: 2.8 %
HEMATOCRIT: 32.2 % — ABNORMAL LOW
HEMATOCRIT: 33.3 % — ABNORMAL LOW
HEMOGLOBIN: 10.8 g/dL — ABNORMAL LOW
HEMOGLOBIN: 11.3 g/dL — ABNORMAL LOW
LYMPHOCYTES ABSOLUTE COUNT: 0.1 10*9/L — ABNORMAL LOW
LYMPHOCYTES ABSOLUTE COUNT: 0 10*9/L — ABNORMAL LOW
LYMPHOCYTES RELATIVE PERCENT: 2.1 % — ABNORMAL LOW
LYMPHOCYTES RELATIVE PERCENT: 1.5 % — ABNORMAL LOW
MEAN CORPUSCULAR HEMOGLOBIN CONC: 33.7 g/dL
MEAN CORPUSCULAR HEMOGLOBIN CONC: 34 g/dL
MEAN CORPUSCULAR HEMOGLOBIN: 31.7 pg
MEAN CORPUSCULAR HEMOGLOBIN: 31.7 pg
MEAN PLATELET VOLUME: 8 fL
MEAN PLATELET VOLUME: 7.8 fL
MONOCYTES ABSOLUTE COUNT: 0.2 10*9/L
MONOCYTES ABSOLUTE COUNT: 0.2 10*9/L
MONOCYTES RELATIVE PERCENT: 6.7 %
MONOCYTES RELATIVE PERCENT: 5.3 %
NEUTROPHILS ABSOLUTE COUNT: 2.2 10*9/L
NEUTROPHILS ABSOLUTE COUNT: 2.7 10*9/L
NEUTROPHILS RELATIVE PERCENT: 88.4 % — ABNORMAL HIGH
NEUTROPHILS RELATIVE PERCENT: 90 % — ABNORMAL HIGH
PLATELET COUNT: 189 10*9/L
PLATELET COUNT: 211 10*9/L
RED BLOOD CELL COUNT: 3.6 10*12/L — ABNORMAL LOW
RED CELL DISTRIBUTION WIDTH: 14.2 %
WBC ADJUSTED: 2.5 10*9/L — ABNORMAL LOW
WBC ADJUSTED: 3 10*9/L — ABNORMAL LOW

## 2017-02-27 LAB — BASIC METABOLIC PANEL
BLOOD UREA NITROGEN: 19 mg/dL — ABNORMAL HIGH
CALCIUM: 9.6 mg/dL
CHLORIDE: 103 mmol/L
CHLORIDE: 102 mmol/L
CO2: 27.3 mmol/L
CO2: 24.9 mmol/L
CREATININE: 1 mg/dL
EGFR MDRD AF AMER: 74 mL/min/{1.73_m2}
GLUCOSE RANDOM: 94 mg/dL
GLUCOSE RANDOM: 95 mg/dL
POTASSIUM: 4.7 mmol/L
POTASSIUM: 4.3 mmol/L
SODIUM: 137 mmol/L
SODIUM: 136 mmol/L

## 2017-02-27 LAB — TACROLIMUS, TROUGH
Lab: 7.4
Lab: 7.7
Lab: 8.7

## 2017-02-27 LAB — URINALYSIS
BACTERIA: NONE SEEN /HPF
BILIRUBIN UA: NEGATIVE
BLOOD UA: NEGATIVE
GLUCOSE UA: NEGATIVE
KETONES UA: NEGATIVE
LEUKOCYTE ESTERASE UA: NEGATIVE
NITRITE UA: NEGATIVE
PH UA: 6.5 (ref 5.0–9.0)
PROTEIN UA: NEGATIVE
RBC UA: <1 /HPF (ref <4)
SPECIFIC GRAVITY UA: 1.017 (ref 1.003–1.030)
SQUAMOUS EPITHELIAL: <1 /HPF (ref 0–5)
UROBILINOGEN UA: 0.2

## 2017-02-27 LAB — PHOSPHORUS
Lab: 4
Lab: 4.7
Phosphate:MCnc:Pt:Ser/Plas:Qn:: 5 — ABNORMAL HIGH

## 2017-02-27 LAB — BASOPHILS RELATIVE PERCENT: Lab: 0.4

## 2017-02-27 LAB — MAGNESIUM
Lab: 1.5 — ABNORMAL LOW
Lab: 1.6 — ABNORMAL LOW
Magnesium:MCnc:Pt:Ser/Plas:Qn:: 1.5 — ABNORMAL LOW

## 2017-02-27 LAB — SODIUM
Lab: 136
Sodium:SCnc:Pt:Ser/Plas:Qn:: 138

## 2017-02-27 LAB — NEUTROPHILS ABSOLUTE COUNT: Lab: 2.9

## 2017-02-27 LAB — CREATININE, URINE: Lab: 67.5

## 2017-02-27 LAB — PROTEIN / CREATININE RATIO, URINE
CREATININE, URINE: 67.5 mg/dL
PROTEIN URINE: 4.2 mg/dL

## 2017-02-27 LAB — CO2: Lab: 27.3

## 2017-02-27 LAB — LEUKOCYTE ESTERASE UA: Lab: NEGATIVE

## 2017-02-27 NOTE — Unmapped (Signed)
Met with patient and husband in transplant clinic.  States one sided headache on the left X 1 week.  No hand tremor, nausea, diarrhea.  Sl pedal edema.  No signs of dysuria.  Staples removed from incision; steri-strips applied.  Edges well approximated.  Has access to all medications.

## 2017-02-27 NOTE — Unmapped (Signed)
Spoke with patient who is on her way- in [place] but running late.

## 2017-02-27 NOTE — Unmapped (Signed)
Outpatient Adult Nutrition - Post Renal Transplant Follow-up     Referring MD or Clinic: Post Renal Transplant Clinic     Reason for Visit: Post Renal Transplant Evaluation Follow-up; renal transplant 01/11/17    Changes from last RD visit on 01/15/17  Following food safety guidelines  Using less salt    Nutrition Goals from RD visit on 01/15/17:   diet advancement - met, basic understanding of nutrition education topic principle of food safety post kidney transplant - met  PMH:   Patient Active Problem List   Diagnosis   ??? Focal segmental glomerulosclerosis   ??? Hypertension   ??? Displacement of lumbar intervertebral disc   ??? Thoracic or lumbosacral neuritis or radiculitis   ??? Anemia   ??? ESRD (end stage renal disease) (CMS-HCC)   ??? Mixed anxiety and depressive disorder   ??? Pulmonary hypertension (CMS-HCC)   ??? Coag negative Staphylococcus bacteremia   ??? Kidney replaced by transplant       Anthropometrics:   February 27, 2017 Weight: 64.9 kg  Height:149.9 cm  BMI:28.87  IBW:45.45 kg   Pt is overweight per BMI standards.      Previous Weights:   01/15/17 68.4 kg (150 lb 14.4 oz)   01/04/17 64.9 kg (143 lb)   01/04/17 65.1 kg (143 lb 9.6 oz)   03/18/16 57.2 kg (126 lb)   06/17/15 58 kg (127 lb 12.8 oz)   05/04/15 52.3 kg (115 lb 4.8 oz)   01/27/15 58.5 kg (128 lb 14.4 oz)   01/18/15 58.7 kg (129 lb 4.8 oz)   11/13/14 62.6 kg (138 lb)   09/02/14 63.2 kg (139 lb 4.8 oz)           Weight Change: wt loss of 7 lbs (3.5 kg)     Nutrition-Focused Physical Findings:   -- Muscle mass: no loss noted  ----- Areas Assessed: temple  -- Fat mass: no loss noted  ----- Areas Assessed: orbital    Relevant Medications, Herbs, Supplements include: Reviewed all nutritionally relevant medications and supplements   Famotidine, not taking  Prograf (aware of grapefruit, pomegranate, star fruit and limit clementine x 1 daily)  Mycophenolate (aware of grapefruit, pomegranate, star fruit and limit clementine x 1 daily)    Relevant Labs: 02/27/17 Mg 1.5 (L), P 5 (H) team aware and monitoring      Physical Activity: none; wants to return to gym; some walking    Dietary Restrictions: food safety guidelines     Allergies:   Allergies   Allergen Reactions   ??? Ibuprofen Swelling     Other reaction(s): SWELLING/EDEMA        Usual Intake:   Wakes about 8 AM  1st - pancakes with syrup with cookies and cream ice cream with water  Snack - chocolate, snack side or reese cups or chocolate crunch bars    2nd - meat or hamburger helper with salad made at home (lettuce, tomato, cucumber, cheese, bacon bits, [place] (deli meat) with ranch dressing) dessert like cake or chicken or pork chop with green beans or   or bojangles chicken bisuut, mac and cheese and mashed potatoes with water  Snack - potato chips or cheese its with water or soda (regular)  3rd -order chopped sirloin with onions and gravy at [employer] with mac and cheese or cabbage or french fries with yeast rolls with  Regular soda (canned brought from home)  Snack - skips or cheese hamburger with mustard, tomatoes on bun with  water or hot dog with mustard, chili coleslaw (from [facility]) water or regular soda  Beverages - water, regular soda, cranberry juice, apple juice orange juice    Other Usual Intake: craving sweets like candy, ice cream, cookie (peanut butter cookies food [employer] brand, short bread)    Behavioral Risk Factors:  Emotional Eating?? No  Meal Schedule??Yes  Meal Skipping?? Yes  Grazing?? No  Night Eating?? Sometimes  Sleep?? 7 hours, with a few bathroom breaks  Overeating?? Seldom    Hunger and Satiety:Pt endorses a good appetite    Nutrition History: denies c/s/n/v/d at this time    Social: Single, has two children at home; son attended this session with pt    Estimated Daily Nutritional  Needs:   1363-1590 kcals/day (30-35 kcal/kg IBW)  54-68 g protein/day (1.2-1.5 g/kg IBW)  Fluids per MD    Nutrition Assessment: Current diet is high in sodium, processed foods, high in phosphorus, and empty calories which may promote weight gain. Pt eating lots of foods  not recommended while on dialysis.  Encouraged pt to choose foods from a variety of sources such as non starchy vegetables, fruit, lean protein, low fat dairy, and whole grains with adequate hydration. Discussed eating more dark green leafy vegetables and fish high in omega 3 fatty acids.     Encouraged pt to increase physical activity.    Pt has experienced 4.6% wt loss during the past 5 weeks which may be likely d/t fluids, weighing error and/or actual wt loss.     Pt assessed to have poor nutrition knowledge for post renal transplant MNT and would benefit from nutrition education.     Progress towards goals: in process      Nutrition Diagnosis:  Malnutrition Assessment using AND/ASPEN Clinical Characteristics:    Patient does not meet AND/ASPEN criteria for malnutrition at this time (02/27/17 1110)                      Food and nutrition related knowledge deficit as related to post transplant MNT as evidenced by per pt report.     Education: post transplant MNT goals and expectations, hydration, exercise regimen, review food safety guidelines      Nutrition Goals:  Meet nutritional needs   Reduce long-term health risk  Normalize labs   Maintain current wt; however, long term-consider wt loss after wound healing complete    Interventions:   1. Eat 3 meals daily with appropriate protein, as discussed; continue with food safety guidelines as reviewed  2. Adequate hydration at least 80 ounces daily  3. Exercise when able      Expected Compliance is:  Comprehension of plan good  Readiness for change good  Ability to meet goals good    Materials Provided were:  List of recommendations    Handout explaining prescribed diet  Nutrition following kidney transplant   RD contact information  Work out to go  Producer, television/film/video following transplant    Follow-up: Next MD visit, or when consulted need to discuss lower P containing foods    Length of visit was: 30 minutes    Greta Doom, MS, RDN, CSG, LDN  [phone] pager

## 2017-02-27 NOTE — Unmapped (Signed)
Neck ear and H/A on left side.  Rates pain a 5. Has had for last 2 weeks. Did see eye doctor no problems noted.

## 2017-02-27 NOTE — Unmapped (Signed)
Work on weight loss in future by going to the gym  Reduce juice and soda consumption  Continue with food safety guidelines  Eat 3 meals daily   Use less salt

## 2017-02-28 LAB — CMV DNA, QUANTITATIVE, PCR

## 2017-02-28 LAB — CMV QUANT LOG10: Lab: 0

## 2017-02-28 MED ORDER — FAMOTIDINE 20 MG TABLET: 20 mg | tablet | Freq: Two times a day (BID) | 11 refills | 0 days | Status: AC

## 2017-02-28 MED ORDER — FAMOTIDINE 20 MG TABLET
Freq: Two times a day (BID) | ORAL | 11 refills | 0.00000 days | Status: CP
Start: 2017-02-28 — End: 2017-02-28

## 2017-02-28 MED ORDER — AMLODIPINE 5 MG TABLET: tablet | 11 refills | 0 days

## 2017-02-28 MED ORDER — AMLODIPINE 5 MG TABLET
ORAL_TABLET | Freq: Every day | ORAL | 11 refills | 0.00000 days | Status: CP
Start: 2017-02-28 — End: 2017-02-28

## 2017-03-01 LAB — TACROLIMUS, TROUGH: Lab: 7.8

## 2017-03-01 MED FILL — AMLODIPINE/5MG/TABS: AMLODIPINE/5MG/TABS | 30 days supply | Qty: 60 | Fill #1

## 2017-03-01 MED FILL — SMZ-TMP/400-80MG/TAB: SMZ-TMP/400-80MG/TAB | 28 days supply | Qty: 12 | Fill #1

## 2017-03-02 LAB — MAGNESIUM
Lab: 1.7 — ABNORMAL LOW
MAGNESIUM: 1.7 mg/dL — ABNORMAL LOW

## 2017-03-02 LAB — BASIC METABOLIC PANEL
CALCIUM: 9.5 mg/dL
CHLORIDE: 103 mmol/L
CO2: 25.3 mmol/L
CREATININE: 0.9 mg/dL
EGFR MDRD AF AMER: 83 mL/min/{1.73_m2}
GLUCOSE RANDOM: 91 mg/dL
POTASSIUM: 4.7 mmol/L
SODIUM: 136 mmol/L

## 2017-03-02 LAB — CO2: Lab: 25.3

## 2017-03-02 LAB — PHOSPHORUS: Lab: 4.7

## 2017-03-02 NOTE — Unmapped (Addendum)
Transplant Nephrology Clinic Visit    History of Present Illness  Gina Hart is a 41 y.o. female who is s/p deceased kidney transplant on 01-21-17. She presents today for follow up.     Patient overall is doing well post-transplant. She denies pain over her graft, hematuria or dysuria. No recent diarrhea.    Patient's main complaint is a left-sided headache that has been present for the past 2 weeks. No obvious inciting/aggravating factors. Takes 1000mg  tylenol 4 times per day for the pain and seems to help some at least. Headache present over the left eye, into the left ear and down her left neck. Feels similar to an earache per patient. No fevers.     Past Medical History     Transplant History:  1. ESRD secondary to FSGS.  2. S/P kidney transplant at [facility]. Deceased/living donor with KDPI 19%. CMV D+/R-; EBV D+/R+  3. Immunosuppression was campath induction followed by tacrolimus and myfortic maintenance therapy.   4. Kidney biopsy on 1998 revealed FSGS.      Past Medical History:  1. ESRD secondary to FSGS.   2. Hypertension  3. Scoliosis  S/P kidney transplant as stated above    Review of Systems    Otherwise on review of systems patient denies fever or chills, chest pain, SOB, PND or orthopnea, lower extremity edema. Denies N/V/abdominal pain. No dysuria, hematuria or difficulty voiding. Bowel movements normal. Denies joint pain or rash. All other systems are reviewed and are negative.    Medications    Current Outpatient Prescriptions   Medication Sig Dispense Refill   ??? acetaminophen (TYLENOL) 325 MG tablet Take 1-2 tablets (325-650 mg total) by mouth every four (4) hours as needed for pain. 100 tablet 0   ??? amLODIPine (NORVASC) 5 MG tablet Take 2 tablets (10 mg total) by mouth daily. 60 tablet 11   ??? aspirin (ECOTRIN) 81 MG tablet Take 1 tablet (81 mg total) by mouth daily. 30 tablet 0   ??? cyclobenzaprine (FLEXERIL) 10 MG tablet Take 1 tablet (10 mg total) by mouth two (2) times a day as needed for muscle spasms. 45 tablet 0   ??? famotidine (PEPCID) 20 MG tablet Take 1 tablet (20 mg total) by mouth Two (2) times a day. 60 tablet 0   ??? loratadine (CLARITIN) 10 mg tablet Take 10 mg by mouth daily as needed for allergies.     ??? metoprolol tartrate (LOPRESSOR) 100 MG tablet Take 1 tablet (100 mg total) by mouth Two (2) times a day. 180 tablet 3   ??? MYFORTIC 180 mg EC tablet Take 3 tablets (540 mg total) by mouth Two (2) times a day. 180 tablet 11   ??? PROGRAF 1 mg capsule Take 3 capsules (3 mg total) by mouth Two (2) times a day. 3 mg Am and 2 mg PM 180 capsule 11   ??? sertraline (ZOLOFT) 100 MG tablet Take 1 tablet (100 mg total) by mouth daily. 90 tablet 3   ??? sulfamethoxazole-trimethoprim (BACTRIM,SEPTRA) 400-80 mg per tablet Take 1 tablet (80 mg of trimethoprim total) by mouth Every Monday, Wednesday, and Friday. 12 tablet 5   ??? traZODone (DESYREL) 50 MG tablet Take 1 tablet (50 mg total) by mouth nightly. 90 tablet 1   ??? valGANciclovir (VALCYTE) 450 mg tablet Take 2 tablets (900 mg total) by mouth daily. 60 tablet 5     No current facility-administered medications for this visit.        Physical Exam  BP 104/60 (BP Site: L Arm, BP Position: Sitting, BP Cuff Size: Medium)  - Pulse 80  - Temp 35.9 ??C (Temporal)  - Ht 149.9 cm (4' 11)  - Wt 64.9 kg (143 lb)  - BMI 28.88 kg/m??   General: well-appearing, in no acute distress  HEENT: anicteric sclera, OP clear  CV: RRR, no m/r/g, no edema  Lungs: CTAB, normal wob  Abd: soft, non-tender, non-distended  MSK: no visible joint swelling  Skin: no visible lesions or rashes  Psych: alert, engaged, appropriate mood and affect     Laboratory Results    Results for orders placed or performed in visit on 02/21/17   Tacrolimus Level, Trough   Result Value Ref Range    Tacrolimus, Trough 7.1 20.0 ng/mL   Basic Metabolic Panel   Result Value Ref Range    Sodium 133 (L) mmol/L    Potassium 4.5 mmol/L    Chloride 101 mmol/L    CO2 26.0 mmol/L    BUN 20 (H) mg/dL    Creatinine 2.95 mg/dL Glucose 97 mg/dL    Calcium 9.7 mg/dL    EGFR MDRD Non Af Amer  mL/min/1.72m2    EGFR MDRD Af Amer 74 mL/min/1.56m2   Phosphorus Level   Result Value Ref Range    Phosphorus 4.3 mg/dL   Magnesium Level   Result Value Ref Range    Magnesium 1.5 (L) mg/dL   CBC w/ Differential   Result Value Ref Range    Results Verified by Slide Scan      WBC 2.9 (L) 10*9/L    WBC  10*9/L    RBC 3.40 (L) 10*12/L    HGB 10.8 (L) g/dL    HCT 62.1 (L) %    MCV 92.8 fL    MCH 31.5 pg    MCHC 33.9 g/dL    RDW 30.8 %    MPV 8.1 fL    Platelet 208 10*9/L    nRBC  /100 WBCs    Neutrophils % 90.9 (H) %    Lymphocytes % 1.3 (L) %    Monocytes % 6.0 %    Eosinophils % 1.7 %    Basophils % 0.1 %    Absolute Neutrophils 2.6 10*9/L    Absolute Lymphocytes 0.0 (L) 10*9/L    Absolute Monocytes 0.2 10*9/L    Absolute Eosinophils 0.0 (L) 10*9/L    Absolute Basophils 0.0 (L) 10*9/L    Microcytosis  Not Present    Macrocytosis  Not Present    Anisocytosis  Not Present    Hyperchromasia  Not Present    Hypochromasia  Not Present       Assessment and Plan  Verble Styron is a 41 y.o. female who is a recipient of a deceased donor transplant on 01-24-17.     1. Status post deceased donor kidney transplant on Jan 24, 2017. CMV D+/R-. Excellent graft function, baseline creatinine 0.9-1.1. No proteinuria. Creatinine at baseline today.    2. Immunosuppression. Currently on tacrolimus 3mg  bid and myfortic 540mg  bid. Tac goal 6-10. Current levels at goal. No changes.    3. Hypertension. On amlodipine 10mg  daily and metoprolol tartrate 100mg  bid. BP 104/60 today. Consider reducing antihypertensives if BP remains low-normal. Will continue current regimen for now.    4. L-sided headache. Unclear etiology; could be tac-associated vs nerve pain vs sinusitis vs inner ear pathology. Will plan for CT vs MRI head; may have to get CT given history of metal implants for scoliosis. Will refer to  ENT for evaluation. If work-up is completely negative and headaches persist may need to consider transition from tac to alternate agent.    4. Health Maintenance. Instructed to get flu shot once they are available. Will plan for pneumonia, Tdap and shingrix vaccines at 1 year post-transplant    5. Follow up. 4 weeks.

## 2017-03-05 LAB — CBC W/ DIFFERENTIAL
BASOPHILS ABSOLUTE COUNT: 0 10*9/L — ABNORMAL LOW
BASOPHILS RELATIVE PERCENT: 0.3 %
EOSINOPHILS ABSOLUTE COUNT: 0.1 10*9/L
EOSINOPHILS RELATIVE PERCENT: 2.4 %
HEMATOCRIT: 33.1 % — ABNORMAL LOW
HEMOGLOBIN: 11 g/dL — ABNORMAL LOW
LYMPHOCYTES ABSOLUTE COUNT: 0.1 10*9/L — ABNORMAL LOW
LYMPHOCYTES RELATIVE PERCENT: 2.5 % — ABNORMAL LOW
MEAN CORPUSCULAR HEMOGLOBIN CONC: 33.4 g/dL
MEAN CORPUSCULAR VOLUME: 93.1 fL
MEAN PLATELET VOLUME: 7.9 fL
MONOCYTES ABSOLUTE COUNT: 0.2 10*9/L
MONOCYTES RELATIVE PERCENT: 7.2 %
NEUTROPHILS ABSOLUTE COUNT: 2.4 10*9/L
NEUTROPHILS RELATIVE PERCENT: 87.6 % — ABNORMAL HIGH
PLATELET COUNT: 204 10*9/L
RED BLOOD CELL COUNT: 3.6 10*12/L — ABNORMAL LOW
RED CELL DISTRIBUTION WIDTH: 14.3 %
WBC ADJUSTED: 2.7 10*9/L — ABNORMAL LOW

## 2017-03-05 LAB — BASOPHILS RELATIVE PERCENT: Lab: 0.3

## 2017-03-05 MED ORDER — RANITIDINE 150 MG CAPSULE
ORAL_CAPSULE | Freq: Two times a day (BID) | ORAL | 3 refills | 0.00000 days | Status: CP
Start: 2017-03-05 — End: 2017-08-22

## 2017-03-05 MED ORDER — RANITIDINE 150 MG TABLET
ORAL_TABLET | ORAL | 3 refills | 0 days
Start: 2017-03-05 — End: 2018-03-20

## 2017-03-05 MED FILL — RANITIDINE HCL/150MG/TABS: RANITIDINE HCL/150MG/TABS | 90 days supply | Qty: 180 | Fill #0

## 2017-03-06 LAB — CBC W/ DIFFERENTIAL
BASOPHILS ABSOLUTE COUNT: 0 10*9/L — ABNORMAL LOW
BASOPHILS RELATIVE PERCENT: 0 %
EOSINOPHILS ABSOLUTE COUNT: 0.1 10*9/L
EOSINOPHILS RELATIVE PERCENT: 2.2 %
HEMATOCRIT: 34.3 % — ABNORMAL LOW
HEMOGLOBIN: 11.5 g/dL — ABNORMAL LOW
LYMPHOCYTES ABSOLUTE COUNT: 0.1 10*9/L — ABNORMAL LOW
LYMPHOCYTES RELATIVE PERCENT: 2.5 % — ABNORMAL LOW
MEAN CORPUSCULAR HEMOGLOBIN: 31.3 pg
MEAN CORPUSCULAR VOLUME: 93 fL
MEAN PLATELET VOLUME: 7.7 fL
MONOCYTES ABSOLUTE COUNT: 0.2 10*9/L
MONOCYTES RELATIVE PERCENT: 6.4 %
NEUTROPHILS ABSOLUTE COUNT: 2.6 10*9/L
NEUTROPHILS RELATIVE PERCENT: 88.9 % — ABNORMAL HIGH
PLATELET COUNT: 218 10*9/L
RED BLOOD CELL COUNT: 3.7 10*12/L — ABNORMAL LOW
RED CELL DISTRIBUTION WIDTH: 14.4 %

## 2017-03-06 LAB — NEUTROPHILS ABSOLUTE COUNT: Lab: 2.6

## 2017-03-06 LAB — BASIC METABOLIC PANEL
BLOOD UREA NITROGEN: 23 mg/dL — ABNORMAL HIGH
CHLORIDE: 102 mmol/L
CO2: 27.4 mmol/L
CREATININE: 0.9 mg/dL
EGFR MDRD AF AMER: 83 mL/min/{1.73_m2}
GLUCOSE RANDOM: 97 mg/dL
POTASSIUM: 4.5 mmol/L
SODIUM: 136 mmol/L

## 2017-03-06 LAB — TACROLIMUS, TROUGH: Lab: 7.6

## 2017-03-06 LAB — PHOSPHORUS: Lab: 4.6

## 2017-03-06 LAB — MAGNESIUM: Lab: 1.5 — ABNORMAL LOW

## 2017-03-06 LAB — EGFR MDRD AF AMER: Lab: 83

## 2017-03-07 LAB — BASIC METABOLIC PANEL
BLOOD UREA NITROGEN: 29 mg/dL — ABNORMAL HIGH
CHLORIDE: 104 mmol/L
CREATININE: 0.9 mg/dL
EGFR MDRD AF AMER: 83 mL/min/{1.73_m2}
GLUCOSE RANDOM: 88 mg/dL
POTASSIUM: 4.2 mmol/L
SODIUM: 137 mmol/L

## 2017-03-07 LAB — MAGNESIUM: Lab: 1.4 — ABNORMAL LOW

## 2017-03-07 LAB — CBC W/ DIFFERENTIAL
BASOPHILS ABSOLUTE COUNT: 0 10*9/L — ABNORMAL LOW
EOSINOPHILS ABSOLUTE COUNT: 0.1 10*9/L
EOSINOPHILS RELATIVE PERCENT: 2.7 %
HEMATOCRIT: 34.2 % — ABNORMAL LOW
HEMOGLOBIN: 11.6 g/dL — ABNORMAL LOW
LYMPHOCYTES ABSOLUTE COUNT: 0.1 10*9/L — ABNORMAL LOW
LYMPHOCYTES RELATIVE PERCENT: 1.9 % — ABNORMAL LOW
MEAN CORPUSCULAR HEMOGLOBIN CONC: 34 g/dL
MEAN CORPUSCULAR HEMOGLOBIN: 31.7 pg
MEAN CORPUSCULAR VOLUME: 93.3 fL
MEAN PLATELET VOLUME: 7.5 fL
MONOCYTES RELATIVE PERCENT: 5.9 %
NEUTROPHILS ABSOLUTE COUNT: 3.9 10*9/L
NEUTROPHILS RELATIVE PERCENT: 89.3 % — ABNORMAL HIGH
PLATELET COUNT: 211 10*9/L
RED CELL DISTRIBUTION WIDTH: 14.3 %
WHITE BLOOD CELL COUNT: 4.3 10*9/L

## 2017-03-07 LAB — CO2: Lab: 26.1

## 2017-03-07 LAB — PHOSPHORUS: Lab: 4.7

## 2017-03-07 LAB — MONOCYTES RELATIVE PERCENT: Lab: 5.9

## 2017-03-09 LAB — TACROLIMUS, TROUGH: Lab: 7.3

## 2017-03-12 LAB — CBC W/ DIFFERENTIAL
BASOPHILS ABSOLUTE COUNT: 0 10*9/L — ABNORMAL LOW
BASOPHILS RELATIVE PERCENT: 0.4 %
EOSINOPHILS ABSOLUTE COUNT: 0.1 10*9/L
EOSINOPHILS RELATIVE PERCENT: 3.7 %
HEMATOCRIT: 34 % — ABNORMAL LOW
HEMOGLOBIN: 11.5 g/dL — ABNORMAL LOW
LYMPHOCYTES ABSOLUTE COUNT: 0.1 10*9/L — ABNORMAL LOW
LYMPHOCYTES RELATIVE PERCENT: 4 % — ABNORMAL LOW
MEAN CORPUSCULAR HEMOGLOBIN CONC: 33.8 g/dL
MEAN CORPUSCULAR HEMOGLOBIN: 31.7 pg
MEAN CORPUSCULAR VOLUME: 93.8 fL
MEAN PLATELET VOLUME: 7.8 fL
MONOCYTES ABSOLUTE COUNT: 0.2 10*9/L
MONOCYTES RELATIVE PERCENT: 8.3 %
NEUTROPHILS ABSOLUTE COUNT: 2 10*9/L
NEUTROPHILS RELATIVE PERCENT: 83.6 % — ABNORMAL HIGH
PLATELET COUNT: 220 10*9/L
RED BLOOD CELL COUNT: 3.6 10*12/L — ABNORMAL LOW
RED CELL DISTRIBUTION WIDTH: 14.3 %
WBC ADJUSTED: 2.4 10*9/L — ABNORMAL LOW

## 2017-03-12 LAB — BASIC METABOLIC PANEL
BLOOD UREA NITROGEN: 15 mg/dL
CALCIUM: 9.6 mg/dL
CHLORIDE: 105 mmol/L
CO2: 27.2 mmol/L
CREATININE: 0.9 mg/dL
EGFR MDRD AF AMER: 83 mL/min/{1.73_m2}
POTASSIUM: 4.7 mmol/L

## 2017-03-12 LAB — MAGNESIUM: Lab: 1.6

## 2017-03-12 LAB — WHITE BLOOD CELL COUNT: Lab: 2.4 — ABNORMAL LOW

## 2017-03-12 LAB — PHOSPHORUS: Lab: 4.3

## 2017-03-12 LAB — TACROLIMUS, TROUGH: Lab: 20.2 — ABNORMAL HIGH

## 2017-03-12 LAB — CREATININE: Lab: 0.9

## 2017-03-12 NOTE — Unmapped (Signed)
Tac > 20.  Called patient and left message to see if she took dose before 9/12 draw.

## 2017-03-12 NOTE — Unmapped (Signed)
Patient returned call and states she did not take Tac before 9/12 draw.  Denies diarrhea. Email to Dr. Roselee Culver to review.

## 2017-03-15 LAB — BASIC METABOLIC PANEL
BLOOD UREA NITROGEN: 18 mg/dL
CALCIUM: 9.9 mg/dL
CHLORIDE: 103 mmol/L
CO2: 26.2 mmol/L
SODIUM: 138 mmol/L

## 2017-03-15 LAB — CBC W/ DIFFERENTIAL
BASOPHILS ABSOLUTE COUNT: 0 10*9/L — ABNORMAL LOW
BASOPHILS RELATIVE PERCENT: 0.2 %
EOSINOPHILS ABSOLUTE COUNT: 0.1 10*9/L
EOSINOPHILS RELATIVE PERCENT: 2.7 %
HEMATOCRIT: 34.1 % — ABNORMAL LOW
LYMPHOCYTES ABSOLUTE COUNT: 0.1 10*9/L — ABNORMAL LOW
LYMPHOCYTES RELATIVE PERCENT: 4.4 % — ABNORMAL LOW
MEAN CORPUSCULAR HEMOGLOBIN CONC: 33.6 g/dL
MEAN CORPUSCULAR HEMOGLOBIN: 31.4 pg
MEAN CORPUSCULAR VOLUME: 93.3 fL
MEAN PLATELET VOLUME: 7.4 fL — ABNORMAL LOW
NEUTROPHILS ABSOLUTE COUNT: 2.3 10*9/L
PLATELET COUNT: 214 10*9/L
RED BLOOD CELL COUNT: 3.7 10*12/L — ABNORMAL LOW
RED CELL DISTRIBUTION WIDTH: 14.3 %
WHITE BLOOD CELL COUNT: 2.7 10*9/L — ABNORMAL LOW

## 2017-03-15 LAB — EGFR MDRD AF AMER: Lab: 83

## 2017-03-15 LAB — SLIDE SCAN: Lab: 0

## 2017-03-15 LAB — MAGNESIUM: Lab: 1.7

## 2017-03-15 LAB — PHOSPHORUS: Lab: 4.6

## 2017-03-16 NOTE — Unmapped (Signed)
Patient called again to say that she had her labs drawn on an odd day for her and she does believe she took her medications before the draw.  Spoke with Dr. Roselee Culver who states that is most likely true since her creatinine is stable.  Will wait for next level.

## 2017-03-19 LAB — TACROLIMUS, TROUGH: Lab: 6.7

## 2017-03-20 LAB — CBC W/ DIFFERENTIAL
BASOPHILS ABSOLUTE COUNT: 0 10*9/L — ABNORMAL LOW
BASOPHILS RELATIVE PERCENT: 0.3 %
EOSINOPHILS ABSOLUTE COUNT: 0.1 10*9/L
EOSINOPHILS RELATIVE PERCENT: 3.3 %
HEMATOCRIT: 34.6 % — ABNORMAL LOW
HEMOGLOBIN: 11.7 g/dL — ABNORMAL LOW
LYMPHOCYTES ABSOLUTE COUNT: 0.1 10*9/L — ABNORMAL LOW
LYMPHOCYTES RELATIVE PERCENT: 5.6 % — ABNORMAL LOW
MEAN CORPUSCULAR HEMOGLOBIN: 31.5 pg
MEAN PLATELET VOLUME: 7.7 fL
MONOCYTES ABSOLUTE COUNT: 0.2 10*9/L
MONOCYTES RELATIVE PERCENT: 10.2 %
NEUTROPHILS ABSOLUTE COUNT: 1.9 10*9/L
NEUTROPHILS RELATIVE PERCENT: 80.6 % — ABNORMAL HIGH
RED BLOOD CELL COUNT: 3.7 10*12/L — ABNORMAL LOW
RED CELL DISTRIBUTION WIDTH: 14.1 %
WHITE BLOOD CELL COUNT: 2.4 10*9/L — ABNORMAL LOW

## 2017-03-20 LAB — BASIC METABOLIC PANEL
BLOOD UREA NITROGEN: 21 mg/dL — ABNORMAL HIGH
CALCIUM: 9.8 mg/dL
CHLORIDE: 104 mmol/L
CO2: 27.6 mmol/L
EGFR MDRD AF AMER: 83 mL/min/{1.73_m2}
GLUCOSE RANDOM: 107 mg/dL — ABNORMAL HIGH
POTASSIUM: 3.9 mmol/L

## 2017-03-20 LAB — MAGNESIUM: Lab: 1.4 — ABNORMAL LOW

## 2017-03-20 LAB — PHOSPHORUS: Lab: 4.2

## 2017-03-20 LAB — CREATININE: Lab: 0.9

## 2017-03-20 LAB — RED CELL DISTRIBUTION WIDTH: Lab: 14.1

## 2017-03-21 LAB — TACROLIMUS, TROUGH: Lab: 8.7

## 2017-03-23 LAB — BASIC METABOLIC PANEL
BLOOD UREA NITROGEN: 20 mg/dL — ABNORMAL HIGH
CALCIUM: 9.6 mg/dL
CO2: 25.6 mmol/L
CREATININE: 0.9 mg/dL
EGFR MDRD AF AMER: 83 mL/min/{1.73_m2}
GLUCOSE RANDOM: 92 mg/dL
POTASSIUM: 4.6 mmol/L

## 2017-03-23 LAB — CBC W/ DIFFERENTIAL
BASOPHILS ABSOLUTE COUNT: 0 10*9/L — ABNORMAL LOW
BASOPHILS RELATIVE PERCENT: 0.3 %
EOSINOPHILS ABSOLUTE COUNT: 0.1 10*9/L
EOSINOPHILS RELATIVE PERCENT: 3.2 %
HEMATOCRIT: 34.1 % — ABNORMAL LOW
HEMOGLOBIN: 11.5 g/dL — ABNORMAL LOW
LYMPHOCYTES RELATIVE PERCENT: 6.8 % — ABNORMAL LOW
MEAN CORPUSCULAR HEMOGLOBIN CONC: 33.7 g/dL
MEAN CORPUSCULAR HEMOGLOBIN: 31.3 pg
MEAN CORPUSCULAR VOLUME: 93 fL
MEAN PLATELET VOLUME: 8 fL
MONOCYTES ABSOLUTE COUNT: 0.2 10*9/L
MONOCYTES RELATIVE PERCENT: 11.6 %
NEUTROPHILS ABSOLUTE COUNT: 1.7 10*9/L
NEUTROPHILS RELATIVE PERCENT: 78.1 % — ABNORMAL HIGH
PLATELET COUNT: 203 10*9/L
RED BLOOD CELL COUNT: 3.7 10*12/L — ABNORMAL LOW
RED CELL DISTRIBUTION WIDTH: 14 %
WBC ADJUSTED: 2.1 10*9/L — ABNORMAL LOW

## 2017-03-23 LAB — GLUCOSE RANDOM: Lab: 92

## 2017-03-23 LAB — MAGNESIUM: Lab: 1.5 — ABNORMAL LOW

## 2017-03-23 LAB — WBC ADJUSTED: Lab: 0

## 2017-03-23 LAB — PHOSPHORUS: Lab: 4.5

## 2017-03-26 LAB — CBC W/ DIFFERENTIAL
BASOPHILS ABSOLUTE COUNT: 0 10*9/L — ABNORMAL LOW
BASOPHILS RELATIVE PERCENT: 0.3 %
EOSINOPHILS ABSOLUTE COUNT: 0.1 10*9/L
EOSINOPHILS RELATIVE PERCENT: 2.1 %
HEMATOCRIT: 35.5 %
HEMOGLOBIN: 11.8 g/dL — ABNORMAL LOW
LYMPHOCYTES ABSOLUTE COUNT: 0.1 10*9/L — ABNORMAL LOW
MEAN CORPUSCULAR HEMOGLOBIN: 31.1 pg
MEAN CORPUSCULAR VOLUME: 93.4 fL
MEAN PLATELET VOLUME: 7.8 fL
MONOCYTES ABSOLUTE COUNT: 0.3 10*9/L
MONOCYTES RELATIVE PERCENT: 11.1 %
NEUTROPHILS ABSOLUTE COUNT: 2.3 10*9/L
PLATELET COUNT: 208 10*9/L
RED BLOOD CELL COUNT: 3.8 10*12/L — ABNORMAL LOW
RED CELL DISTRIBUTION WIDTH: 14.1 %
WBC ADJUSTED: 2.9 10*9/L — ABNORMAL LOW

## 2017-03-26 LAB — BASIC METABOLIC PANEL
CALCIUM: 9.6 mg/dL
CHLORIDE: 104 mmol/L
CO2: 25.7 mmol/L
CREATININE: 0.9 mg/dL
EGFR MDRD AF AMER: 83 mL/min/{1.73_m2}
GLUCOSE RANDOM: 105 mg/dL — ABNORMAL HIGH
POTASSIUM: 4.3 mmol/L

## 2017-03-26 LAB — PHOSPHORUS: Lab: 4.2

## 2017-03-26 LAB — CALCIUM: Lab: 9.6

## 2017-03-26 LAB — MAGNESIUM: Lab: 1.5 — ABNORMAL LOW

## 2017-03-26 LAB — EOSINOPHILS ABSOLUTE COUNT: Lab: 0.1

## 2017-03-26 LAB — TACROLIMUS, TROUGH: Lab: 7.1

## 2017-03-28 MED FILL — SMZ-TMP/400-80MG/TAB: SMZ-TMP/400-80MG/TAB | 28 days supply | Qty: 12 | Fill #2

## 2017-03-29 LAB — CHLORIDE: Lab: 103

## 2017-03-29 LAB — CBC W/ DIFFERENTIAL
BASOPHILS ABSOLUTE COUNT: 0 10*9/L — ABNORMAL LOW
BASOPHILS RELATIVE PERCENT: 0.7 %
EOSINOPHILS ABSOLUTE COUNT: 0.1 10*9/L
EOSINOPHILS RELATIVE PERCENT: 2.8 %
HEMATOCRIT: 36.5 %
HEMOGLOBIN: 12.1 g/dL
LYMPHOCYTES ABSOLUTE COUNT: 0.2 10*9/L — ABNORMAL LOW
LYMPHOCYTES RELATIVE PERCENT: 7.4 % — ABNORMAL LOW
MEAN CORPUSCULAR HEMOGLOBIN CONC: 33.2 g/dL — ABNORMAL LOW
MEAN CORPUSCULAR HEMOGLOBIN: 31 pg
MEAN CORPUSCULAR VOLUME: 93.5 fL
MEAN PLATELET VOLUME: 7.8 fL
MONOCYTES ABSOLUTE COUNT: 0.2 10*9/L
MONOCYTES RELATIVE PERCENT: 10.4 %
NEUTROPHILS ABSOLUTE COUNT: 1.7 10*9/L
NEUTROPHILS RELATIVE PERCENT: 78.7 % — ABNORMAL HIGH
PLATELET COUNT: 200 10*9/L
RED CELL DISTRIBUTION WIDTH: 13.8 %
WBC ADJUSTED: 2.2 10*9/L — ABNORMAL LOW

## 2017-03-29 LAB — HEMOGLOBIN: Lab: 12.1

## 2017-03-29 LAB — BASIC METABOLIC PANEL
BLOOD UREA NITROGEN: 24 mg/dL — ABNORMAL HIGH
CHLORIDE: 103 mmol/L
CO2: 27.1 mmol/L
CREATININE: 1 mg/dL
GLUCOSE RANDOM: 94 mg/dL
POTASSIUM: 4.5 mmol/L

## 2017-03-29 LAB — MAGNESIUM: Lab: 1.5 — ABNORMAL LOW

## 2017-03-29 LAB — PHOSPHORUS: Lab: 5 — ABNORMAL HIGH

## 2017-03-29 LAB — TACROLIMUS, TROUGH: Lab: 9.3

## 2017-03-30 LAB — TACROLIMUS, TROUGH: Lab: 8.8

## 2017-04-03 LAB — CBC W/ DIFFERENTIAL
BASOPHILS ABSOLUTE COUNT: 0 10*9/L — ABNORMAL LOW
BASOPHILS RELATIVE PERCENT: 0.3 %
EOSINOPHILS RELATIVE PERCENT: 3.8 %
HEMATOCRIT: 36.2 %
HEMOGLOBIN: 12.1 g/dL
LYMPHOCYTES ABSOLUTE COUNT: 0.1 10*9/L — ABNORMAL LOW
LYMPHOCYTES RELATIVE PERCENT: 6.5 % — ABNORMAL LOW
MEAN CORPUSCULAR HEMOGLOBIN: 31.1 pg
MEAN CORPUSCULAR VOLUME: 93.1 fL
MEAN PLATELET VOLUME: 7.7 fL
MONOCYTES ABSOLUTE COUNT: 0.2 10*9/L
MONOCYTES RELATIVE PERCENT: 9.6 %
NEUTROPHILS ABSOLUTE COUNT: 1.8 10*9/L
NEUTROPHILS RELATIVE PERCENT: 79.8 % — ABNORMAL HIGH
PLATELET COUNT: 208 10*9/L
RED BLOOD CELL COUNT: 3.9 10*12/L — ABNORMAL LOW
RED CELL DISTRIBUTION WIDTH: 13.7 %
WBC ADJUSTED: 2.2 10*9/L — ABNORMAL LOW

## 2017-04-03 LAB — TACROLIMUS, TROUGH: Lab: 9.2

## 2017-04-03 LAB — PHOSPHORUS
Lab: 4.4
PHOSPHORUS: 4.4 mg/dL

## 2017-04-03 LAB — BASIC METABOLIC PANEL
BLOOD UREA NITROGEN: 21 mg/dL — ABNORMAL HIGH
CHLORIDE: 105 mmol/L
CO2: 24.1 mmol/L
CREATININE: 0.8 mg/dL
EGFR MDRD AF AMER: 95 mL/min/{1.73_m2}
GLUCOSE RANDOM: 94 mg/dL
POTASSIUM: 4.4 mmol/L

## 2017-04-03 LAB — MAGNESIUM: Lab: 1.6 — ABNORMAL LOW

## 2017-04-03 LAB — BLOOD UREA NITROGEN: Lab: 21 — ABNORMAL HIGH

## 2017-04-03 LAB — BASOPHILS RELATIVE PERCENT: Lab: 0.3

## 2017-04-03 MED FILL — AMLODIPINE/5MG/TABS: AMLODIPINE/5MG/TABS | 30 days supply | Qty: 60 | Fill #2

## 2017-04-04 LAB — TACROLIMUS, TROUGH: Lab: 6.3

## 2017-04-04 NOTE — Unmapped (Signed)
Pt called and stated that her PCP prescribed her Tramadol and flexeril for back pain, made her aware that it was ok to take those medications, pt stated understanding.

## 2017-04-05 NOTE — Unmapped (Signed)
Patient's PCP has given her a script for flexeril and tramadol. Assured her those were OK to take.  She has been referredt o the [facility] for her neck and shoulder pain.

## 2017-04-06 LAB — MAGNESIUM: Lab: 1.7 — ABNORMAL LOW

## 2017-04-06 LAB — PHOSPHORUS: Lab: 4.2

## 2017-04-06 LAB — SODIUM: Lab: 140

## 2017-04-06 LAB — CBC W/ DIFFERENTIAL
BASOPHILS ABSOLUTE COUNT: 0 10*9/L
BASOPHILS RELATIVE PERCENT: 0.4 %
EOSINOPHILS ABSOLUTE COUNT: 0.1 10*9/L
EOSINOPHILS RELATIVE PERCENT: 4.4 %
HEMATOCRIT: 36.6 %
HEMOGLOBIN: 12.3 g/dL
LYMPHOCYTES ABSOLUTE COUNT: 0.2 10*9/L — ABNORMAL LOW
LYMPHOCYTES RELATIVE PERCENT: 8.3 % — ABNORMAL LOW
MEAN CORPUSCULAR HEMOGLOBIN CONC: 33.6 g/dL
MEAN CORPUSCULAR VOLUME: 93.3 fL
MEAN PLATELET VOLUME: 7.5 fL
MONOCYTES ABSOLUTE COUNT: 0.2 10*9/L
MONOCYTES RELATIVE PERCENT: 8 %
NEUTROPHILS ABSOLUTE COUNT: 1.6 10*9/L — ABNORMAL LOW
NEUTROPHILS RELATIVE PERCENT: 78.9 % — ABNORMAL HIGH
PLATELET COUNT: 211 10*9/L
RED BLOOD CELL COUNT: 3.9 10*12/L — ABNORMAL LOW
RED CELL DISTRIBUTION WIDTH: 13.4 %
WBC ADJUSTED: 2 10*9/L — ABNORMAL LOW

## 2017-04-06 LAB — BASIC METABOLIC PANEL
BLOOD UREA NITROGEN: 25 mg/dL — ABNORMAL HIGH
CALCIUM: 9.9 mg/dL
CO2: 28.8 mmol/L
CREATININE: 1 mg/dL
EGFR MDRD AF AMER: 74 mL/min/{1.73_m2}
GLUCOSE RANDOM: 103 mg/dL — ABNORMAL HIGH
POTASSIUM: 4.9 mmol/L
SODIUM: 140 mmol/L

## 2017-04-06 LAB — PLATELET COUNT: Lab: 211

## 2017-04-09 LAB — TACROLIMUS, TROUGH: Lab: 7.8

## 2017-04-09 NOTE — Unmapped (Signed)
Lab called and stated that pt WBC is 1.4, will route note to primary TNC.

## 2017-04-10 LAB — MAGNESIUM
Lab: 1.6 — ABNORMAL LOW
MAGNESIUM: 1.6 mg/dL — ABNORMAL LOW

## 2017-04-10 LAB — CBC W/ DIFFERENTIAL
BASOPHILS ABSOLUTE COUNT: 0 10*9/L — ABNORMAL LOW
BASOPHILS RELATIVE PERCENT: 0.6 %
EOSINOPHILS ABSOLUTE COUNT: 0.1 10*9/L
EOSINOPHILS RELATIVE PERCENT: 6.5 %
HEMATOCRIT: 35.9 %
HEMOGLOBIN: 12.3 g/dL
LYMPHOCYTES ABSOLUTE COUNT: 0.2 10*9/L — ABNORMAL LOW
LYMPHOCYTES RELATIVE PERCENT: 12 % — ABNORMAL LOW
MEAN CORPUSCULAR HEMOGLOBIN CONC: 34.2 g/dL
MEAN CORPUSCULAR HEMOGLOBIN: 31.6 pg
MEAN PLATELET VOLUME: 7.5 fL
MONOCYTES ABSOLUTE COUNT: 0.1 10*9/L — ABNORMAL LOW
MONOCYTES RELATIVE PERCENT: 6.5 %
NEUTROPHILS ABSOLUTE COUNT: 1.1 10*9/L — ABNORMAL LOW
NEUTROPHILS RELATIVE PERCENT: 74.4 % — ABNORMAL HIGH
PLATELET COUNT: 222 10*9/L
RED CELL DISTRIBUTION WIDTH: 13.3 %
WBC ADJUSTED: 1.4 10*9/L — ABNORMAL LOW

## 2017-04-10 LAB — EGFR MDRD NON AF AMER: Lab: 0

## 2017-04-10 LAB — PHOSPHORUS: Lab: 4.3

## 2017-04-10 LAB — BASIC METABOLIC PANEL
BLOOD UREA NITROGEN: 22 mg/dL — ABNORMAL HIGH
CALCIUM: 9.5 mg/dL
CHLORIDE: 104 mmol/L
CO2: 25.7 mmol/L
GLUCOSE RANDOM: 92 mg/dL
POTASSIUM: 4 mmol/L

## 2017-04-10 LAB — MEAN CORPUSCULAR HEMOGLOBIN CONC: Lab: 34.2

## 2017-04-11 LAB — TACROLIMUS, TROUGH: Lab: 7.3

## 2017-04-13 LAB — MAGNESIUM: Lab: 1.5 — ABNORMAL LOW

## 2017-04-13 LAB — BASIC METABOLIC PANEL
BLOOD UREA NITROGEN: 14 mg/dL
CALCIUM: 9.4 mg/dL
CHLORIDE: 104 mmol/L
CO2: 25.9 mmol/L
CREATININE: 1.1 mg/dL
EGFR MDRD AF AMER: 66 mL/min/{1.73_m2}
POTASSIUM: 4.2 mmol/L

## 2017-04-13 LAB — CBC W/ DIFFERENTIAL
BASOPHILS ABSOLUTE COUNT: 0 10*9/L — ABNORMAL LOW
BASOPHILS RELATIVE PERCENT: 0.2 %
EOSINOPHILS ABSOLUTE COUNT: 0.1 10*9/L
EOSINOPHILS RELATIVE PERCENT: 5.2 %
HEMATOCRIT: 37.6 %
HEMOGLOBIN: 12.6 g/dL
LYMPHOCYTES ABSOLUTE COUNT: 0.2 10*9/L — ABNORMAL LOW
LYMPHOCYTES RELATIVE PERCENT: 11.5 % — ABNORMAL LOW
MEAN CORPUSCULAR VOLUME: 93.7 fL
MEAN PLATELET VOLUME: 7.5 fL
MONOCYTES RELATIVE PERCENT: 5.6 %
NEUTROPHILS ABSOLUTE COUNT: 1.1 10*9/L — ABNORMAL LOW
NEUTROPHILS RELATIVE PERCENT: 77.5 % — ABNORMAL HIGH
PLATELET COUNT: 208 10*9/L
RED BLOOD CELL COUNT: 4 10*12/L
RED CELL DISTRIBUTION WIDTH: 13.6 %
WBC ADJUSTED: 1.4 10*9/L — ABNORMAL LOW

## 2017-04-13 LAB — PHOSPHORUS: Lab: 4.3

## 2017-04-13 LAB — HYPOCHROMIA: Lab: 0

## 2017-04-13 LAB — CO2: Lab: 25.9

## 2017-04-13 MED ORDER — MYFORTIC 180 MG TABLET,DELAYED RELEASE: 360 mg | tablet | 11 refills | 0 days

## 2017-04-13 MED ORDER — MYFORTIC 180 MG TABLET,DELAYED RELEASE
ORAL_TABLET | Freq: Two times a day (BID) | ORAL | 11 refills | 0.00000 days | Status: CP
Start: 2017-04-13 — End: 2017-04-19

## 2017-04-13 NOTE — Unmapped (Signed)
Patient beng treated for severe neck and shoulder pain.  Upcoming appointment with Physical medicine and rehab Dec 7.  Today patient called stating the  Pain has increased and the medication prescribed by her PCP (a muscle relaxant and tramadol) are not helping. She states she is in a lot of pain.  Called Dr. Marvis Moeller (PCP) office and  They will contact her.    Reviewed WBC with Dr. Margaretmary Bayley.  Asked patient to reduce myfortic to 360 mg twice a day.  She  States an understanding.

## 2017-04-16 LAB — CBC W/ DIFFERENTIAL
BASOPHILS ABSOLUTE COUNT: 0 10*9/L — ABNORMAL LOW
BASOPHILS RELATIVE PERCENT: 0.7 %
EOSINOPHILS ABSOLUTE COUNT: 0.1 10*9/L
EOSINOPHILS RELATIVE PERCENT: 7.5 %
HEMATOCRIT: 37.5 %
HEMOGLOBIN: 12.7 g/dL
LYMPHOCYTES ABSOLUTE COUNT: 0.1 10*9/L — ABNORMAL LOW
LYMPHOCYTES RELATIVE PERCENT: 11 % — ABNORMAL LOW
MEAN CORPUSCULAR HEMOGLOBIN CONC: 33.8 g/dL
MEAN CORPUSCULAR HEMOGLOBIN: 31.4 pg
MEAN CORPUSCULAR VOLUME: 92.9 fL
MEAN PLATELET VOLUME: 7.7 fL
MONOCYTES ABSOLUTE COUNT: 0.1 10*9/L — ABNORMAL LOW
MONOCYTES RELATIVE PERCENT: 6.8 %
NEUTROPHILS ABSOLUTE COUNT: 0.8 10*9/L — ABNORMAL LOW
NEUTROPHILS RELATIVE PERCENT: 74 %
RED BLOOD CELL COUNT: 4 10*12/L
RED CELL DISTRIBUTION WIDTH: 13 %
WBC ADJUSTED: 1.1 10*9/L

## 2017-04-16 LAB — HYPERCHROMASIA: Lab: 0

## 2017-04-16 LAB — BASIC METABOLIC PANEL
CALCIUM: 9.8 mg/dL
CHLORIDE: 104 mmol/L
CO2: 28.2 mmol/L
EGFR MDRD AF AMER: 74 mL/min/{1.73_m2}
GLUCOSE RANDOM: 96 mg/dL
POTASSIUM: 4.4 mmol/L
SODIUM: 136 mmol/L

## 2017-04-16 LAB — MAGNESIUM: Lab: 1.6

## 2017-04-16 LAB — CREATININE: Lab: 1

## 2017-04-16 LAB — PHOSPHORUS: Lab: 4.5

## 2017-04-17 LAB — TACROLIMUS, TROUGH: Lab: 7.7

## 2017-04-17 NOTE — Unmapped (Signed)
In basket message to Dr. Margaretmary Bayley regarding WBC 1.1

## 2017-04-19 ENCOUNTER — Ambulatory Visit
Admission: RE | Admit: 2017-04-19 | Discharge: 2017-04-19 | Disposition: A | Discharge status: Home / Self Care | Payer: MEDICARE

## 2017-04-19 DIAGNOSIS — M542 Cervicalgia: Principal | ICD-10-CM

## 2017-04-19 DIAGNOSIS — M25512 Pain in left shoulder: Principal | ICD-10-CM

## 2017-04-19 LAB — TACROLIMUS, TROUGH: Lab: 7.2

## 2017-04-19 MED ORDER — MYFORTIC 180 MG TABLET,DELAYED RELEASE: 180 mg | tablet | Freq: Two times a day (BID) | 11 refills | 0 days | Status: AC

## 2017-04-19 MED ORDER — MYFORTIC 180 MG TABLET,DELAYED RELEASE
ORAL_TABLET | Freq: Two times a day (BID) | ORAL | 11 refills | 0.00000 days | Status: CP
Start: 2017-04-19 — End: 2018-02-15

## 2017-04-19 NOTE — Unmapped (Signed)
Reviewed wbc of 1.1 with Dr. Margaretmary Bayley.  Spoke with patient who understands to decrease myfortic to 180 mg twice a day.  She is on the way to her PCP office for more evaluation regarding her neck pain

## 2017-04-19 NOTE — Unmapped (Signed)
[facility] Specialty Medication Referral: No PA required    Medication (Brand/Generic): Myfortic    Initial FSI Test Claim completed with resulted information below:  No PA required  Patient ABLE to fill at [facility]  Insurance Company:  Medicare B  Anticipated Copay: $0   **Dose change**    As Co-pay is under $100 defined limit, per policy there will be no further investigation of need for financial assistance at this time unless patient requests. This referral has been communicated to the provider and handed off to the [facility] team for further processing and filling of prescribed medication.   ______________________________________________________________________  Please utilize this referral for viewing purposes as it will serve as the central location for all relevant documentation and updates.

## 2017-04-23 LAB — BASIC METABOLIC PANEL
BLOOD UREA NITROGEN: 16 mg/dL
CALCIUM: 9.9 mg/dL
CHLORIDE: 103 mmol/L
CO2: 28.2 mmol/L
CREATININE: 0.9 mg/dL
EGFR MDRD AF AMER: 83 mL/min/{1.73_m2}
POTASSIUM: 5 mmol/L

## 2017-04-23 LAB — CBC W/ DIFFERENTIAL
BASOPHILS ABSOLUTE COUNT: 0 10*9/L — ABNORMAL LOW
BASOPHILS RELATIVE PERCENT: 0 %
EOSINOPHILS ABSOLUTE COUNT: 0.1 10*9/L
EOSINOPHILS RELATIVE PERCENT: 8.6 % — ABNORMAL HIGH
HEMATOCRIT: 38.2 %
HEMOGLOBIN: 12.9 g/dL
LYMPHOCYTES ABSOLUTE COUNT: 0.2 10*9/L — ABNORMAL LOW
LYMPHOCYTES RELATIVE PERCENT: 16.3 % — ABNORMAL LOW
MEAN CORPUSCULAR HEMOGLOBIN CONC: 33.8 g/dL
MEAN CORPUSCULAR VOLUME: 92.8 fL
MEAN PLATELET VOLUME: 7.7 fL
MONOCYTES ABSOLUTE COUNT: 0.1 10*9/L — ABNORMAL LOW
MONOCYTES RELATIVE PERCENT: 9 %
NEUTROPHILS ABSOLUTE COUNT: 0.8 10*9/L — ABNORMAL LOW
NEUTROPHILS RELATIVE PERCENT: 66.1 %
PLATELET COUNT: 202 10*9/L
RED BLOOD CELL COUNT: 4.1 10*12/L
WBC ADJUSTED: 1.2 10*9/L — ABNORMAL LOW

## 2017-04-23 LAB — POTASSIUM: Lab: 5

## 2017-04-23 LAB — MAGNESIUM
Lab: 1.6 — ABNORMAL LOW
MAGNESIUM: 1.6 mg/dL — ABNORMAL LOW

## 2017-04-23 LAB — PLATELET COUNT: Lab: 202

## 2017-04-23 LAB — TACROLIMUS, TROUGH: Lab: 10.5

## 2017-04-23 LAB — PHOSPHORUS: Lab: 4.5

## 2017-04-23 NOTE — Unmapped (Deleted)
{** REMINDER - THIS NOTE IS NOT A FINAL MEDICAL RECORD UNTIL IT IS SIGNED.  UNTIL THEN, THE CONTENT BELOW MAY REFLECT INFORMATION FROM A DOCUMENTATION TEMPLATE, NOT THE ACTUAL PATIENT VISIT. **}    Transplant Nephrology Clinic Visit    History of Present Illness  Gina Hart is a 41 y.o. female who is s/p deceased kidney transplant on 2017/01/19. She presents today for follow up.     Since last seen, the patient has don well in terms of transplant. She presents today with no complaints.    Patient overall is doing well post-transplant. She denies pain over her graft, hematuria or dysuria. No recent diarrhea.    Patient's main complaint is a left-sided headache that has been present for the past 2 weeks. No obvious inciting/aggravating factors. Takes 1000mg  tylenol 4 times per day for the pain and seems to help some at least. Headache present over the left eye, into the left ear and down her left neck. Feels similar to an earache per patient. No fevers.     Past Medical History     Transplant History:  1. ESRD secondary to FSGS.  2. S/P kidney transplant at [facility]. Deceased/living donor with KDPI 19%. CMV D+/R-; EBV D+/R+  3. Immunosuppression was campath induction followed by tacrolimus and myfortic maintenance therapy.   4. Kidney biopsy on 1998 revealed FSGS.      Past Medical History:  1. ESRD secondary to FSGS.   2. Hypertension  3. Scoliosis  S/P kidney transplant as stated above    Review of Systems    Otherwise on review of systems patient denies fever or chills, chest pain, SOB, PND or orthopnea, lower extremity edema. Denies N/V/abdominal pain. No dysuria, hematuria or difficulty voiding. Bowel movements normal. Denies joint pain or rash. All other systems are reviewed and are negative.    Medications    Current Outpatient Prescriptions   Medication Sig Dispense Refill   ??? acetaminophen (TYLENOL) 325 MG tablet Take 1-2 tablets (325-650 mg total) by mouth every four (4) hours as needed for pain. 100 tablet 0   ??? amLODIPine (NORVASC) 5 MG tablet Take 2 tablets (10 mg total) by mouth daily. 60 tablet 11   ??? aspirin (ECOTRIN) 81 MG tablet Take 1 tablet (81 mg total) by mouth daily. 30 tablet 0   ??? cyclobenzaprine (FLEXERIL) 10 MG tablet Take 1 tablet (10 mg total) by mouth two (2) times a day as needed for muscle spasms. 45 tablet 0   ??? loratadine (CLARITIN) 10 mg tablet Take 10 mg by mouth daily as needed for allergies.     ??? metoprolol tartrate (LOPRESSOR) 100 MG tablet Take 1 tablet (100 mg total) by mouth Two (2) times a day. 180 tablet 3   ??? MYFORTIC 180 mg EC tablet Take 1 tablet (180 mg total) by mouth Two (2) times a day. 60 tablet 11   ??? PROGRAF 1 mg capsule Take 3 capsules (3 mg total) by mouth Two (2) times a day. 3 mg Am and 2 mg PM 180 capsule 11   ??? ranitidine (ZANTAC) 150 MG capsule Take 1 capsule (150 mg total) by mouth Two (2) times a day. 180 capsule 3   ??? sertraline (ZOLOFT) 100 MG tablet Take 1 tablet (100 mg total) by mouth daily. 90 tablet 3   ??? sulfamethoxazole-trimethoprim (BACTRIM,SEPTRA) 400-80 mg per tablet Take 1 tablet (80 mg of trimethoprim total) by mouth Every Monday, Wednesday, and Friday. 12 tablet 5   ???  traZODone (DESYREL) 50 MG tablet Take 1 tablet (50 mg total) by mouth nightly. 90 tablet 1   ??? valGANciclovir (VALCYTE) 450 mg tablet Take 2 tablets (900 mg total) by mouth daily. 60 tablet 5     No current facility-administered medications for this visit.        Physical Exam    BP 104/60 (BP Site: L Arm, BP Position: Sitting, BP Cuff Size: Medium)  - Pulse 80  - Temp 35.9 ??C (Temporal)  - Ht 149.9 cm (4' 11)  - Wt 64.9 kg (143 lb)  - BMI 28.88 kg/m??   General: well-appearing, in no acute distress  HEENT: anicteric sclera, OP clear  CV: RRR, no m/r/g, no edema  Lungs: CTAB, normal wob  Abd: soft, non-tender, non-distended  MSK: no visible joint swelling  Skin: no visible lesions or rashes  Psych: alert, engaged, appropriate mood and affect     Laboratory Results    Recent Results (from the past 170 hour(s))   Basic Metabolic Panel    Collection Time: 04/20/17  9:14 AM   Result Value Ref Range    Sodium 134 (L) mmol/L    Potassium 5.0 mmol/L    Chloride 103 mmol/L    CO2 28.2 mmol/L    BUN 16 mg/dL    Creatinine 1.61 mg/dL    Glucose 97 mg/dL    Calcium 9.9 mg/dL    EGFR MDRD Non Af Amer  mL/min/1.63m2    EGFR MDRD Af Amer 83 mL/min/1.17m2   Phosphorus Level    Collection Time: 04/20/17  9:14 AM   Result Value Ref Range    Phosphorus 4.5 mg/dL   Magnesium Level    Collection Time: 04/20/17  9:14 AM   Result Value Ref Range    Magnesium 1.6 (L) mg/dL   CBC w/ Differential    Collection Time: 04/20/17  9:14 AM   Result Value Ref Range    Results Verified by Slide Scan      WBC 1.2 (CL) 10*9/L    WBC  10*9/L    RBC 4.10 10*12/L    HGB 12.9 g/dL    HCT 09.6 %    MCV 04.5 fL    MCH 31.4 pg    MCHC 33.8 g/dL    RDW 40.9 %    MPV 7.7 fL    Platelet 202 10*9/L    nRBC  /100 WBCs    Neutrophils % 66.1 %    Lymphocytes % 16.3 (L) %    Monocytes % 9.0 %    Eosinophils % 8.6 (H) %    Basophils % 0.0 %    Absolute Neutrophils 0.8 (L) 10*9/L    Absolute Lymphocytes 0.2 (L) 10*9/L    Absolute Monocytes 0.1 (L) 10*9/L    Absolute Eosinophils 0.1 10*9/L    Absolute Basophils 0.0 (L) 10*9/L    Microcytosis  Not Present    Macrocytosis  Not Present    Anisocytosis  Not Present    Hyperchromasia  Not Present    Hypochromasia  Not Present         Assessment and Plan  Gina Hart is a 41 y.o. female who is a recipient of a deceased donor transplant on 02/06/2017.     1. Status post deceased donor kidney transplant on 02/06/17. CMV D+/R-. Excellent graft function, baseline creatinine 0.9-1.1. No proteinuria. Creatinine at baseline today.    2. Immunosuppression. Currently on tacrolimus 3mg  bid and myfortic 540mg  bid. Tac goal 6-10. Current levels  at goal. No changes.    3. Hypertension. On amlodipine 10mg  daily and metoprolol tartrate 100mg  bid. BP 104/60 today. Consider reducing antihypertensives if BP remains low-normal. Will continue current regimen for now.    4. L-sided headache. Unclear etiology; could be tac-associated vs nerve pain vs sinusitis vs inner ear pathology. Will plan for CT vs MRI head; may have to get CT given history of metal implants for scoliosis. Will refer to ENT for evaluation. If work-up is completely negative and headaches persist may need to consider transition from tac to alternate agent.    4. Health Maintenance. Instructed to get flu shot once they are available. Will plan for pneumonia, Tdap and shingrix vaccines at 1 year post-transplant    5. Follow up. 4 weeks.    Scribe's Attestation: Jackey Loge, MD obtained and performed the history, physical exam and medical decision making elements that were entered into the chart.  Signed by Ladell Heads) Fransico Sciandra , Scribe, on April 23, 2017 at ***.    {*** NOTE TO PROVIDER: PLEASE ADD ATTESTATION NOTING YOU AGREE WITH SCRIBE DOCUMENTATION}

## 2017-04-24 LAB — BASIC METABOLIC PANEL
CALCIUM: 9.3 mg/dL
CHLORIDE: 105 mmol/L
CO2: 25.3 mmol/L
CREATININE: 1 mg/dL
GLUCOSE RANDOM: 91 mg/dL
POTASSIUM: 4.1 mmol/L
SODIUM: 138 mmol/L

## 2017-04-24 LAB — CBC W/ DIFFERENTIAL
BASOPHILS ABSOLUTE COUNT: 0 10*9/L — ABNORMAL LOW
BASOPHILS RELATIVE PERCENT: 2.2 % — ABNORMAL HIGH
EOSINOPHILS ABSOLUTE COUNT: 0.1 10*9/L
EOSINOPHILS RELATIVE PERCENT: 9.2 % — ABNORMAL HIGH
HEMATOCRIT: 37.5 %
HEMOGLOBIN: 12.7 g/dL
LYMPHOCYTES ABSOLUTE COUNT: 0.2 10*9/L — ABNORMAL LOW
LYMPHOCYTES RELATIVE PERCENT: 16.3 % — ABNORMAL LOW
MEAN CORPUSCULAR HEMOGLOBIN: 31.2 pg
MEAN CORPUSCULAR VOLUME: 92.3 fL
MEAN PLATELET VOLUME: 7.9 fL
MONOCYTES ABSOLUTE COUNT: 0.1 10*9/L — ABNORMAL LOW
MONOCYTES RELATIVE PERCENT: 6.6 %
NEUTROPHILS ABSOLUTE COUNT: 0.7 10*9/L — ABNORMAL LOW
NEUTROPHILS RELATIVE PERCENT: 65.7 %
PLATELET COUNT: 199 10*9/L
RED BLOOD CELL COUNT: 4.1 10*12/L
RED CELL DISTRIBUTION WIDTH: 13 %
WBC ADJUSTED: 1 10*9/L — ABNORMAL LOW

## 2017-04-24 LAB — PHOSPHORUS
Lab: 4.2
PHOSPHORUS: 4.2 mg/dL

## 2017-04-24 LAB — EGFR MDRD AF AMER: Lab: 74

## 2017-04-24 LAB — MAGNESIUM: Lab: 1.4 — ABNORMAL LOW

## 2017-04-24 LAB — NEUTROPHILS RELATIVE PERCENT: Lab: 65.7

## 2017-04-26 MED FILL — SMZ-TMP/400-80MG/TAB: SMZ-TMP/400-80MG/TAB | 28 days supply | Qty: 12 | Fill #3

## 2017-04-26 MED FILL — AMLODIPINE/5MG/TABS: AMLODIPINE/5MG/TABS | 30 days supply | Qty: 60 | Fill #3

## 2017-04-27 LAB — CBC W/ DIFFERENTIAL
BASOPHILS ABSOLUTE COUNT: 0 10*9/L — ABNORMAL LOW
BASOPHILS RELATIVE PERCENT: 0.1 %
EOSINOPHILS ABSOLUTE COUNT: 0.1 10*9/L
EOSINOPHILS RELATIVE PERCENT: 6.7 %
HEMATOCRIT: 39.1 %
HEMOGLOBIN: 13.2 g/dL
LYMPHOCYTES RELATIVE PERCENT: 18.9 % — ABNORMAL LOW
MEAN CORPUSCULAR HEMOGLOBIN CONC: 33.8 g/dL
MEAN CORPUSCULAR HEMOGLOBIN: 31.1 pg
MEAN PLATELET VOLUME: 8 fL
MONOCYTES ABSOLUTE COUNT: 0.1 10*9/L — ABNORMAL LOW
MONOCYTES RELATIVE PERCENT: 8.4 %
NEUTROPHILS ABSOLUTE COUNT: 0.9 10*9/L — ABNORMAL LOW
NEUTROPHILS RELATIVE PERCENT: 65.9 %
PLATELET COUNT: 197 10*9/L
RED BLOOD CELL COUNT: 4.3 10*12/L
RED CELL DISTRIBUTION WIDTH: 12.9 %
WHITE BLOOD CELL COUNT: 1.3 10*9/L — ABNORMAL LOW

## 2017-04-27 LAB — BASIC METABOLIC PANEL
CALCIUM: 9.6 mg/dL
CHLORIDE: 104 mmol/L
CREATININE: 1 mg/dL
GLUCOSE RANDOM: 91 mg/dL
POTASSIUM: 4.6 mmol/L
SODIUM: 138 mmol/L

## 2017-04-27 LAB — MAGNESIUM: Lab: 1.6 — ABNORMAL LOW

## 2017-04-27 LAB — PHOSPHORUS: Lab: 4.4

## 2017-04-27 LAB — CHLORIDE: Lab: 104

## 2017-04-27 LAB — NUCLEATED RED BLOOD CELLS: Lab: 0

## 2017-05-01 LAB — CBC W/ DIFFERENTIAL
BASOPHILS ABSOLUTE COUNT: 0 10*9/L — ABNORMAL LOW
BASOPHILS RELATIVE PERCENT: 0.1 %
EOSINOPHILS ABSOLUTE COUNT: 0.1 10*9/L
EOSINOPHILS RELATIVE PERCENT: 6.6 %
HEMATOCRIT: 38 %
HEMOGLOBIN: 12.9 g/dL
LYMPHOCYTES ABSOLUTE COUNT: 0.2 10*9/L — ABNORMAL LOW
LYMPHOCYTES RELATIVE PERCENT: 13.6 % — ABNORMAL LOW
MEAN CORPUSCULAR HEMOGLOBIN CONC: 33.8 g/dL
MEAN CORPUSCULAR HEMOGLOBIN: 31.1 pg
MEAN CORPUSCULAR VOLUME: 91.8 fL
MEAN PLATELET VOLUME: 7.5 fL
MONOCYTES ABSOLUTE COUNT: 0.1 10*9/L — ABNORMAL LOW
MONOCYTES RELATIVE PERCENT: 7.9 %
NEUTROPHILS ABSOLUTE COUNT: 1.1 10*9/L — ABNORMAL LOW
PLATELET COUNT: 213 10*9/L
RED BLOOD CELL COUNT: 4.1 10*12/L
RED CELL DISTRIBUTION WIDTH: 12.9 %
WBC ADJUSTED: 1.6 10*9/L — ABNORMAL LOW

## 2017-05-01 LAB — BASIC METABOLIC PANEL
BLOOD UREA NITROGEN: 20 mg/dL — ABNORMAL HIGH
CALCIUM: 9.7 mg/dL
CHLORIDE: 104 mmol/L
CO2: 24 mmol/L
CREATININE: 0.9 mg/dL
EGFR MDRD AF AMER: 83 mL/min/{1.73_m2}
POTASSIUM: 4.4 mmol/L
SODIUM: 137 mmol/L

## 2017-05-01 LAB — WHITE BLOOD CELL COUNT: Lab: 1.6 — CL

## 2017-05-01 LAB — EGFR MDRD AF AMER: Lab: 83

## 2017-05-01 LAB — MAGNESIUM: Lab: 1.6 — ABNORMAL LOW

## 2017-05-01 LAB — PHOSPHORUS: Lab: 4.1

## 2017-05-01 NOTE — Unmapped (Signed)
Receive critical WBC value of 1.6, drawn today.  Per labit was drawn around 1030.    Primary coordinator notified.

## 2017-05-02 LAB — TACROLIMUS, TROUGH: Lab: 9.5

## 2017-05-03 LAB — TACROLIMUS LEVEL, TROUGH: TACROLIMUS, TROUGH: 10 ng/mL (ref <=20.0)

## 2017-05-03 LAB — TACROLIMUS, TROUGH: Lab: 10

## 2017-05-04 LAB — BASIC METABOLIC PANEL
BLOOD UREA NITROGEN: 21 mg/dL — ABNORMAL HIGH
CALCIUM: 9.7 mg/dL
CHLORIDE: 104 mmol/L
EGFR MDRD AF AMER: 74 mL/min/{1.73_m2}
GLUCOSE RANDOM: 90 mg/dL
POTASSIUM: 5.2 mmol/L — ABNORMAL HIGH
SODIUM: 138 mmol/L

## 2017-05-04 LAB — MAGNESIUM: Lab: 1.5 — ABNORMAL LOW

## 2017-05-04 LAB — PHOSPHORUS: Lab: 4.3

## 2017-05-04 LAB — TACROLIMUS LEVEL, TROUGH: TACROLIMUS, TROUGH: 7.9 ng/mL (ref <=20.0)

## 2017-05-04 LAB — POTASSIUM: Lab: 5.2 — ABNORMAL HIGH

## 2017-05-04 LAB — TACROLIMUS, TROUGH: Lab: 7.9

## 2017-05-04 MED ORDER — PROGRAF 1 MG CAPSULE: capsule | 11 refills | 0 days

## 2017-05-04 MED ORDER — PROGRAF 1 MG CAPSULE
ORAL_CAPSULE | ORAL | 11 refills | 0.00000 days | Status: CP
Start: 2017-05-04 — End: 2017-05-04

## 2017-05-04 NOTE — Unmapped (Signed)
Received critical lab value. WBC is 1.6. Primary coordinator notified.

## 2017-05-07 LAB — BASIC METABOLIC PANEL
BLOOD UREA NITROGEN: 22 mg/dL — ABNORMAL HIGH
CHLORIDE: 104 mmol/L
CO2: 25.7 mmol/L
CREATININE: 1 mg/dL
EGFR MDRD AF AMER: 74 mL/min/{1.73_m2}
POTASSIUM: 4.4 mmol/L
SODIUM: 139 mmol/L

## 2017-05-07 LAB — CBC W/ DIFFERENTIAL
BASOPHILS ABSOLUTE COUNT: 0 10*9/L — ABNORMAL LOW
BASOPHILS ABSOLUTE COUNT: 0 10*9/L — ABNORMAL LOW
BASOPHILS RELATIVE PERCENT: 0 %
EOSINOPHILS ABSOLUTE COUNT: 0.1 10*9/L
EOSINOPHILS RELATIVE PERCENT: 7.3 %
EOSINOPHILS RELATIVE PERCENT: 5.9 %
HEMATOCRIT: 38.5 %
HEMOGLOBIN: 13.3 g/dL
HEMOGLOBIN: 13 g/dL
LYMPHOCYTES ABSOLUTE COUNT: 0.3 10*9/L — ABNORMAL LOW
LYMPHOCYTES ABSOLUTE COUNT: 0.2 10*9/L — ABNORMAL LOW
LYMPHOCYTES RELATIVE PERCENT: 16.5 % — ABNORMAL LOW
LYMPHOCYTES RELATIVE PERCENT: 14.7 % — ABNORMAL LOW
MEAN CORPUSCULAR HEMOGLOBIN CONC: 33.9 g/dL
MEAN CORPUSCULAR HEMOGLOBIN CONC: 33.8 g/dL
MEAN CORPUSCULAR HEMOGLOBIN: 31.2 pg
MEAN CORPUSCULAR HEMOGLOBIN: 31 pg
MEAN CORPUSCULAR VOLUME: 92 fL
MEAN CORPUSCULAR VOLUME: 92 fL
MEAN PLATELET VOLUME: 7.6 fL
MEAN PLATELET VOLUME: 7.9 fL
MONOCYTES ABSOLUTE COUNT: 0.1 10*9/L — ABNORMAL LOW
MONOCYTES ABSOLUTE COUNT: 0.1 10*9/L — ABNORMAL LOW
MONOCYTES RELATIVE PERCENT: 9.4 %
MONOCYTES RELATIVE PERCENT: 8.4 %
NEUTROPHILS ABSOLUTE COUNT: 1 10*9/L — ABNORMAL LOW
NEUTROPHILS ABSOLUTE COUNT: 1.1 10*9/L — ABNORMAL LOW
NEUTROPHILS RELATIVE PERCENT: 66.5 %
NEUTROPHILS RELATIVE PERCENT: 71 %
PLATELET COUNT: 215 10*9/L
PLATELET COUNT: 227 10*9/L
RED BLOOD CELL COUNT: 4.3 10*12/L
RED BLOOD CELL COUNT: 4.2 10*12/L
RED CELL DISTRIBUTION WIDTH: 12.9 %
RED CELL DISTRIBUTION WIDTH: 13 %
WHITE BLOOD CELL COUNT: 1.6 10*9/L — ABNORMAL LOW

## 2017-05-07 LAB — PHOSPHORUS: Lab: 4.4

## 2017-05-07 LAB — NEUTROPHILS RELATIVE PERCENT: Lab: 66.5

## 2017-05-07 LAB — SLIDE SCAN: Lab: 0

## 2017-05-07 LAB — MAGNESIUM: Lab: 1.6 — ABNORMAL LOW

## 2017-05-07 LAB — EGFR MDRD NON AF AMER: Lab: 0

## 2017-05-07 LAB — TACROLIMUS, TROUGH: Lab: 8.6

## 2017-05-08 NOTE — Unmapped (Signed)
Patient needs vocational rehab form signed; called [facility])- they are faxing the form to [place]

## 2017-05-10 LAB — TACROLIMUS, TROUGH: Lab: 10

## 2017-05-14 LAB — CBC W/ DIFFERENTIAL
BASOPHILS ABSOLUTE COUNT: 0 10*9/L — ABNORMAL LOW
BASOPHILS RELATIVE PERCENT: 0.9 %
EOSINOPHILS ABSOLUTE COUNT: 0.1 10*9/L
EOSINOPHILS RELATIVE PERCENT: 7.7 %
HEMATOCRIT: 39 %
HEMOGLOBIN: 13.2 g/dL
LYMPHOCYTES ABSOLUTE COUNT: 0.3 10*9/L — ABNORMAL LOW
LYMPHOCYTES RELATIVE PERCENT: 16.9 % — ABNORMAL LOW
MEAN CORPUSCULAR HEMOGLOBIN CONC: 33.8 g/dL
MEAN CORPUSCULAR HEMOGLOBIN: 30.9 pg
MEAN CORPUSCULAR VOLUME: 91.4 fL
MONOCYTES ABSOLUTE COUNT: 0.1 10*9/L — ABNORMAL LOW
NEUTROPHILS ABSOLUTE COUNT: 1.2 10*9/L — ABNORMAL LOW
NEUTROPHILS RELATIVE PERCENT: 67.3 %
PLATELET COUNT: 206 10*9/L
RED BLOOD CELL COUNT: 4.3 10*12/L
RED CELL DISTRIBUTION WIDTH: 12.7 %
WBC ADJUSTED: 1.8 10*9/L — ABNORMAL LOW

## 2017-05-14 LAB — BASIC METABOLIC PANEL
BLOOD UREA NITROGEN: 21 mg/dL — ABNORMAL HIGH
CALCIUM: 9.4 mg/dL
CREATININE: 0.9 mg/dL
EGFR MDRD AF AMER: 83 mL/min/{1.73_m2}
GLUCOSE RANDOM: 90 mg/dL
SODIUM: 138 mmol/L

## 2017-05-14 LAB — EGFR MDRD NON AF AMER: Lab: 0

## 2017-05-14 LAB — BASOPHILS RELATIVE PERCENT: Lab: 0.9

## 2017-05-14 LAB — PHOSPHORUS: Lab: 3.9

## 2017-05-14 LAB — MAGNESIUM: Lab: 1.4 — ABNORMAL LOW

## 2017-05-18 NOTE — Unmapped (Signed)
Critical alert paged received WBC 1.7, ANC 1.1.  Requested labs results to be faxed.

## 2017-05-21 LAB — CBC W/ DIFFERENTIAL
BASOPHILS ABSOLUTE COUNT: 0 10*9/L — ABNORMAL LOW
BASOPHILS RELATIVE PERCENT: 1 %
BASOPHILS RELATIVE PERCENT: 0.4 %
EOSINOPHILS ABSOLUTE COUNT: 0.1 10*9/L
EOSINOPHILS ABSOLUTE COUNT: 0.2 10*9/L
EOSINOPHILS RELATIVE PERCENT: 8.1 % — ABNORMAL HIGH
HEMATOCRIT: 39.3 %
HEMATOCRIT: 40.5 %
HEMOGLOBIN: 13.7 g/dL
LYMPHOCYTES ABSOLUTE COUNT: 0.2 10*9/L — ABNORMAL LOW
LYMPHOCYTES RELATIVE PERCENT: 17 % — ABNORMAL LOW
LYMPHOCYTES RELATIVE PERCENT: 12.4 % — ABNORMAL LOW
MEAN CORPUSCULAR HEMOGLOBIN CONC: 33.7 g/dL
MEAN CORPUSCULAR HEMOGLOBIN CONC: 33.9 g/dL
MEAN CORPUSCULAR HEMOGLOBIN: 30.8 pg
MEAN CORPUSCULAR HEMOGLOBIN: 31 pg
MEAN CORPUSCULAR VOLUME: 91.4 fL
MEAN CORPUSCULAR VOLUME: 91.4 fL
MEAN PLATELET VOLUME: 8.4 fL
MEAN PLATELET VOLUME: 7.9 fL
MONOCYTES ABSOLUTE COUNT: 0.1 10*9/L — ABNORMAL LOW
MONOCYTES ABSOLUTE COUNT: 0.1 10*9/L — ABNORMAL LOW
MONOCYTES RELATIVE PERCENT: 6.3 %
MONOCYTES RELATIVE PERCENT: 6.3 %
NEUTROPHILS ABSOLUTE COUNT: 1.4 10*9/L — ABNORMAL LOW
NEUTROPHILS RELATIVE PERCENT: 67.6 %
NEUTROPHILS RELATIVE PERCENT: 71.8 %
PLATELET COUNT: 199 10*9/L
PLATELET COUNT: 220 10*9/L
RED BLOOD CELL COUNT: 4.3 10*12/L
RED BLOOD CELL COUNT: 4.4 10*12/L
RED CELL DISTRIBUTION WIDTH: 12.5 %
RED CELL DISTRIBUTION WIDTH: 12.7 %
WBC ADJUSTED: 1.7 10*9/L — ABNORMAL LOW
WBC ADJUSTED: 2 10*9/L — ABNORMAL LOW

## 2017-05-21 LAB — BASIC METABOLIC PANEL
BLOOD UREA NITROGEN: 23 mg/dL — ABNORMAL HIGH
BLOOD UREA NITROGEN: 21 mg/dL — ABNORMAL HIGH
CALCIUM: 9.6 mg/dL
CHLORIDE: 102 mmol/L
CHLORIDE: 101 mmol/L
CO2: 28.5 mmol/L
CREATININE: 0.9 mg/dL
CREATININE: 1 mg/dL
EGFR MDRD AF AMER: 74 mL/min/{1.73_m2}
POTASSIUM: 5 mmol/L
POTASSIUM: 4.6 mmol/L
SODIUM: 137 mmol/L
SODIUM: 137 mmol/L

## 2017-05-21 LAB — BASOPHILS ABSOLUTE COUNT: Lab: 0 — ABNORMAL LOW

## 2017-05-21 LAB — PHOSPHORUS
Lab: 4.2
Lab: 4.2

## 2017-05-21 LAB — NEUTROPHILS ABSOLUTE COUNT: Lab: 1.2 — ABNORMAL LOW

## 2017-05-21 LAB — TACROLIMUS, TROUGH
Lab: 10.3
Lab: 10.9

## 2017-05-21 LAB — EGFR MDRD AF AMER: Lab: 74

## 2017-05-21 LAB — MAGNESIUM
Lab: 1.4 — ABNORMAL LOW
Lab: 1.6 — ABNORMAL LOW

## 2017-05-21 LAB — CHLORIDE: Lab: 102

## 2017-05-21 MED FILL — AMLODIPINE/5MG/TABS: AMLODIPINE/5MG/TABS | 30 days supply | Qty: 60 | Fill #4

## 2017-05-21 MED FILL — SULFAMETHOXAZOLE/TRIMETHO/400-80MG/TABS: SULFAMETHOXAZOLE/TRIMETHO/400-80MG/TABS | 28 days supply | Qty: 12 | Fill #4

## 2017-05-23 LAB — TACROLIMUS, TROUGH: Lab: 7.8

## 2017-05-25 ENCOUNTER — Ambulatory Visit
Admission: RE | Admit: 2017-05-25 | Discharge: 2017-05-25 | Disposition: A | Discharge status: Home / Self Care | Admitting: Anesthesiology

## 2017-05-25 DIAGNOSIS — M961 Postlaminectomy syndrome, not elsewhere classified: Secondary | ICD-10-CM

## 2017-05-25 DIAGNOSIS — M25512 Pain in left shoulder: Principal | ICD-10-CM

## 2017-05-25 DIAGNOSIS — M545 Low back pain: Secondary | ICD-10-CM

## 2017-05-25 DIAGNOSIS — M5410 Radiculopathy, site unspecified: Secondary | ICD-10-CM

## 2017-05-25 NOTE — Unmapped (Signed)
Patient at physician office to evaluate sciatic pain.  She will let me know the outcome.  Going t o once a week labs.

## 2017-05-28 LAB — CBC W/ DIFFERENTIAL
BASOPHILS ABSOLUTE COUNT: 0 10*9/L — ABNORMAL LOW
BASOPHILS RELATIVE PERCENT: 1.1 %
EOSINOPHILS ABSOLUTE COUNT: 0.2 10*9/L
EOSINOPHILS RELATIVE PERCENT: 7.3 %
HEMATOCRIT: 40.7 %
HEMOGLOBIN: 13.8 g/dL
LYMPHOCYTES ABSOLUTE COUNT: 0.4 10*9/L — ABNORMAL LOW
MEAN CORPUSCULAR HEMOGLOBIN CONC: 33.9 g/dL
MEAN CORPUSCULAR VOLUME: 91.5 fL
MEAN PLATELET VOLUME: 8 fL
MONOCYTES ABSOLUTE COUNT: 0.2 10*9/L
NEUTROPHILS ABSOLUTE COUNT: 1.5 10*9/L — ABNORMAL LOW
NEUTROPHILS RELATIVE PERCENT: 65.1 %
PLATELET COUNT: 230 10*9/L
RED BLOOD CELL COUNT: 4.5 10*12/L
RED CELL DISTRIBUTION WIDTH: 12.6 %
WHITE BLOOD CELL COUNT: 2.3 10*9/L — ABNORMAL LOW

## 2017-05-28 LAB — BASIC METABOLIC PANEL
BLOOD UREA NITROGEN: 21 mg/dL — ABNORMAL HIGH
CHLORIDE: 104 mmol/L
CO2: 26.4 mmol/L
CREATININE: 0.9 mg/dL
EGFR MDRD AF AMER: 83 mL/min/{1.73_m2}
GLUCOSE RANDOM: 100 mg/dL — ABNORMAL HIGH
POTASSIUM: 4.5 mmol/L

## 2017-05-28 LAB — PHOSPHORUS: Lab: 4.3

## 2017-05-28 LAB — TACROLIMUS, TROUGH: Lab: 9.8

## 2017-05-28 LAB — EOSINOPHILS ABSOLUTE COUNT: Lab: 0.2

## 2017-05-28 LAB — SODIUM: Lab: 139

## 2017-05-28 LAB — MAGNESIUM: Lab: 1.6 — ABNORMAL LOW

## 2017-05-29 ENCOUNTER — Ambulatory Visit
Admission: RE | Admit: 2017-05-29 | Discharge: 2017-05-29 | Disposition: A | Discharge status: Home / Self Care | Attending: Nephrology | Admitting: Nephrology

## 2017-05-29 ENCOUNTER — Ambulatory Visit
Admission: RE | Admit: 2017-05-29 | Discharge: 2017-05-29 | Disposition: A | Discharge status: Home / Self Care | Payer: MEDICARE

## 2017-05-29 DIAGNOSIS — D849 Immunodeficiency, unspecified: Secondary | ICD-10-CM | POA: Insufficient documentation

## 2017-05-29 DIAGNOSIS — Z48298 Encounter for aftercare following other organ transplant: Secondary | ICD-10-CM | POA: Insufficient documentation

## 2017-05-29 DIAGNOSIS — D899 Disorder involving the immune mechanism, unspecified: Secondary | ICD-10-CM

## 2017-05-29 DIAGNOSIS — Z940 Kidney transplant status: Principal | ICD-10-CM

## 2017-05-29 DIAGNOSIS — I10 Essential (primary) hypertension: Secondary | ICD-10-CM

## 2017-05-29 LAB — CBC W/ AUTO DIFF
BASOPHILS ABSOLUTE COUNT: 0 10*9/L (ref 0.0–0.1)
EOSINOPHILS ABSOLUTE COUNT: 0.2 10*9/L (ref 0.0–0.4)
HEMATOCRIT: 40.3 % (ref 36.0–46.0)
HEMOGLOBIN: 13.4 g/dL (ref 12.0–16.0)
LARGE UNSTAINED CELLS: 3 % (ref 0–4)
LYMPHOCYTES ABSOLUTE COUNT: 0.6 10*9/L — ABNORMAL LOW (ref 1.5–5.0)
MEAN CORPUSCULAR HEMOGLOBIN CONC: 33.3 g/dL (ref 31.0–37.0)
MEAN CORPUSCULAR HEMOGLOBIN: 31.1 pg (ref 26.0–34.0)
MEAN CORPUSCULAR VOLUME: 93.6 fL (ref 80.0–100.0)
MEAN PLATELET VOLUME: 8 fL (ref 7.0–10.0)
MONOCYTES ABSOLUTE COUNT: 0.1 10*9/L — ABNORMAL LOW (ref 0.2–0.8)
PLATELET COUNT: 227 10*9/L (ref 150–440)
RED CELL DISTRIBUTION WIDTH: 13.2 % (ref 12.0–15.0)
WBC ADJUSTED: 2.6 10*9/L — ABNORMAL LOW (ref 4.5–11.0)

## 2017-05-29 LAB — COMPREHENSIVE METABOLIC PANEL
ALBUMIN: 4.4 g/dL (ref 3.5–5.0)
ALKALINE PHOSPHATASE: 103 U/L (ref 38–126)
ALT (SGPT): 68 U/L — ABNORMAL HIGH (ref 15–48)
ANION GAP: 11 mmol/L (ref 9–15)
AST (SGOT): 47 U/L — ABNORMAL HIGH (ref 14–38)
BILIRUBIN TOTAL: 0.4 mg/dL (ref 0.0–1.2)
BLOOD UREA NITROGEN: 19 mg/dL (ref 7–21)
BUN / CREAT RATIO: 20
CALCIUM: 10.1 mg/dL (ref 8.5–10.2)
CHLORIDE: 104 mmol/L (ref 98–107)
CO2: 25 mmol/L (ref 22.0–30.0)
CREATININE: 0.94 mg/dL (ref 0.60–1.00)
EGFR MDRD NON AF AMER: >=60 mL/min/{1.73_m2} (ref >=60)
GLUCOSE RANDOM: 96 mg/dL (ref 65–99)
POTASSIUM: 4.9 mmol/L (ref 3.5–5.0)
SODIUM: 140 mmol/L (ref 135–145)

## 2017-05-29 LAB — URINALYSIS
BACTERIA: NONE SEEN /HPF
BILIRUBIN UA: NEGATIVE
BLOOD UA: NEGATIVE
GLUCOSE UA: NEGATIVE
KETONES UA: NEGATIVE
LEUKOCYTE ESTERASE UA: NEGATIVE
PH UA: 6.5 (ref 5.0–9.0)
PROTEIN UA: NEGATIVE
RBC UA: <1 /HPF (ref <4)
SPECIFIC GRAVITY UA: 1.019 (ref 1.003–1.030)
SQUAMOUS EPITHELIAL: <1 /HPF (ref 0–5)
UROBILINOGEN UA: 0.2
WBC UA: <1 /HPF (ref 0–5)

## 2017-05-29 LAB — SLIDE REVIEW

## 2017-05-29 LAB — HEMOGLOBIN A1C: ESTIMATED AVERAGE GLUCOSE: 94 mg/dL

## 2017-05-29 LAB — PROTEIN / CREATININE RATIO, URINE
PROTEIN URINE: 5.4 mg/dL
PROTEIN/CREAT RATIO, URINE: 0.058

## 2017-05-29 LAB — NEUTROPHIL LEFT SHIFT

## 2017-05-29 LAB — TACROLIMUS, TROUGH: Lab: 6.5

## 2017-05-29 LAB — MAGNESIUM: Magnesium:MCnc:Pt:Ser/Plas:Qn:: 1.4 — ABNORMAL LOW

## 2017-05-29 LAB — PHOSPHORUS: Phosphate:MCnc:Pt:Ser/Plas:Qn:: 4.6

## 2017-05-29 LAB — SODIUM: Sodium:SCnc:Pt:Ser/Plas:Qn:: 140

## 2017-05-29 LAB — PROTEIN URINE: Protein:MCnc:Pt:Urine:Qn:: 5.4

## 2017-05-29 LAB — ESTIMATED AVERAGE GLUCOSE: Estimated average glucose:MCnc:Pt:Bld:Qn:Estimated from glycated hemoglobin: 94

## 2017-05-29 LAB — SMEAR REVIEW

## 2017-05-29 LAB — BACTERIA: Lab: NONE SEEN

## 2017-05-29 MED ORDER — GABAPENTIN 100 MG CAPSULE
ORAL_CAPSULE | Freq: Three times a day (TID) | ORAL | 2 refills | 0 days | Status: CP
Start: 2017-05-29 — End: 2017-06-04

## 2017-05-29 NOTE — Unmapped (Signed)
A urine specimen was collected at the visit.

## 2017-05-29 NOTE — Unmapped (Signed)
Met with patient in transplant nephrology clinic.  She denies headache, nausea, diarrhea.  Slight hand tremor.  Has had neck pain (note CT).  Saw pain clinic- asked for MRI.  Will schedule.  Now c/o right back/hip pain with pain down the outside of her leg past her knee.  Sl edema of the right ankle and foot at the end of the day. Last took Prograf at 9 PM.  Patient is currently employed part-time; patient choice.  Her functional score is currently 90%; Able to carry out normal activiy; minor signs and symptoms of disease.Marland Kitchen

## 2017-05-29 NOTE — Unmapped (Signed)
Transplant Nephrology Clinic Visit    History of Present Illness  Gina Hart is a 41 y.o. female who is s/p deceased kidney transplant on 02/07/2017. She presents today with complaints of neck and lower back pain. She was evaluated in pain clinic but left before receiving a full evaluation. It was recommended patient have an MRI of lumbar spine to assess degeneration of lumbar area and probable radiculopathy. This has not yet been done.    She reports her headaches have resolved since last visit. She is otherwise without complaints.       Past Medical History     Transplant History:  1. 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  2. S/P kidney transplant at [facility] 29-Jan-2107. Deceased donor with KDPI 19%. CMV D+/R-; EBV D+/R+  3. Immunosuppression was campath induction followed by tacrolimus and myfortic maintenance therapy, steroid free regimen.      Past Medical History:  1. ESRD secondary to FSGS.   2. Hypertension  3. Scoliosis  S/P kidney transplant as stated above    Review of Systems    All other systems are reviewed and are negative.    Medications  Current Outpatient Prescriptions   Medication Sig Dispense Refill   ??? acetaminophen (TYLENOL) 325 MG tablet Take 1-2 tablets (325-650 mg total) by mouth every four (4) hours as needed for pain. 100 tablet 0   ??? amLODIPine (NORVASC) 5 MG tablet Take 2 tablets (10 mg total) by mouth daily. 60 tablet 11   ??? aspirin (ECOTRIN) 81 MG tablet Take 1 tablet (81 mg total) by mouth daily. 30 tablet 0   ??? cyclobenzaprine (FLEXERIL) 10 MG tablet Take 1 tablet (10 mg total) by mouth two (2) times a day as needed for muscle spasms. 45 tablet 0   ??? loratadine (CLARITIN) 10 mg tablet Take 10 mg by mouth daily as needed for allergies.     ??? metoprolol tartrate (LOPRESSOR) 100 MG tablet Take 1 tablet (100 mg total) by mouth Two (2) times a day. 180 tablet 3   ??? MYFORTIC 180 mg EC tablet Take 1 tablet (180 mg total) by mouth Two (2) times a day. 60 tablet 11   ??? PROGRAF 1 mg capsule 3 mg Am and 2 mg PM 180 capsule 11   ??? ranitidine (ZANTAC) 150 MG capsule Take 1 capsule (150 mg total) by mouth Two (2) times a day. 180 capsule 3   ??? sertraline (ZOLOFT) 100 MG tablet Take 1 tablet (100 mg total) by mouth daily. 90 tablet 3   ??? sulfamethoxazole-trimethoprim (BACTRIM,SEPTRA) 400-80 mg per tablet Take 1 tablet (80 mg of trimethoprim total) by mouth Every Monday, Wednesday, and Friday. 12 tablet 5   ??? traMADol (ULTRAM) 50 mg tablet Take 50 mg by mouth every six (6) hours as needed for pain.     ??? traZODone (DESYREL) 50 MG tablet Take 1 tablet (50 mg total) by mouth nightly. 90 tablet 1   ??? valGANciclovir (VALCYTE) 450 mg tablet Take 2 tablets (900 mg total) by mouth daily. 60 tablet 5     No current facility-administered medications for this visit.        Physical Exam    BP 84/68 (BP Site: L Arm, BP Position: Sitting, BP Cuff Size: Medium)  - Pulse 68  - Temp 36.2 ??C (97.2 ??F) (Temporal)  - Ht 149.9 cm (4' 11.02)  - Wt 70.7 kg (155 lb 14.4 oz)   General: well-appearing, in no acute  distress  HEENT: anicteric sclera, OP clear  CV: RRR, no m/r/g, no edema  Lungs: CTAB, normal wob  Abd: soft, non-tender, non-distended  MSK: no visible joint swelling  Skin: no visible lesions or rashes  Psych: alert, engaged, appropriate mood and affect     Laboratory Results  Recent Results (from the past 170 hour(s))   Basic Metabolic Panel    Collection Time: 05/25/17  9:10 AM   Result Value Ref Range    Sodium 139 mmol/L    Potassium 4.5 mmol/L    Chloride 104 mmol/L    CO2 26.4 mmol/L    BUN 21 (H) mg/dL    Creatinine 1.61 mg/dL    Glucose 096 (H) mg/dL    Calcium 9.9 mg/dL    EGFR MDRD Non Af Amer  mL/min/1.87m2    EGFR MDRD Af Amer 83 mL/min/1.16m2   Phosphorus Level    Collection Time: 05/25/17  9:10 AM   Result Value Ref Range    Phosphorus 4.3 mg/dL   Magnesium Level    Collection Time: 05/25/17  9:10 AM   Result Value Ref Range    Magnesium 1.6 (L) mg/dL   CBC w/ Differential    Collection Time: 05/25/17  9:10 AM   Result Value Ref Range    Results Verified by Slide Scan      WBC 2.3 (L) 10*9/L    WBC  10*9/L    RBC 4.50 10*12/L    HGB 13.8 g/dL    HCT 04.5 %    MCV 40.9 fL    MCH 31.0 pg    MCHC 33.9 g/dL    RDW 81.1 %    MPV 8.0 fL    Platelet 230 10*9/L    nRBC  /100 WBCs    Neutrophils % 65.1 %    Lymphocytes % 19.5 (L) %    Monocytes % 7.0 %    Eosinophils % 7.3 %    Basophils % 1.1 %    Absolute Neutrophils 1.5 (L) 10*9/L    Absolute Lymphocytes 0.4 (L) 10*9/L    Absolute Monocytes 0.2 10*9/L    Absolute Eosinophils 0.2 10*9/L    Absolute Basophils 0.0 (L) 10*9/L    Microcytosis  Not Present    Macrocytosis  Not Present    Anisocytosis  Not Present    Hyperchromasia  Not Present    Hypochromasia  Not Present   Tacrolimus Level, Trough    Collection Time: 05/25/17  9:10 AM   Result Value Ref Range    Tacrolimus, Trough 9.8 20.0 ng/mL   CBC w/ Differential    Collection Time: 05/29/17 10:32 AM   Result Value Ref Range    WBC 2.6 (L) 4.5 - 11.0 10*9/L    RBC 4.30 4.00 - 5.20 10*12/L    HGB 13.4 12.0 - 16.0 g/dL    HCT 91.4 78.2 - 95.6 %    MCV 93.6 80.0 - 100.0 fL    MCH 31.1 26.0 - 34.0 pg    MCHC 33.3 31.0 - 37.0 g/dL    RDW 21.3 08.6 - 57.8 %    MPV 8.0 7.0 - 10.0 fL    Platelet 227 150 - 440 10*9/L    Neutrophil Left Shift 3+ (A) Not Present    Absolute Neutrophils 1.7 (L) 2.0 - 7.5 10*9/L    Absolute Lymphocytes 0.6 (L) 1.5 - 5.0 10*9/L    Absolute Monocytes 0.1 (L) 0.2 - 0.8 10*9/L    Absolute Eosinophils 0.2 0.0 -  0.4 10*9/L    Absolute Basophils 0.0 0.0 - 0.1 10*9/L    Large Unstained Cells 3 0 - 4 %       Assessment and Plan  Gina Hart is a 41 y.o. female who is a recipient of a deceased donor transplant on Feb 10, 2017.     1. Status post deceased donor kidney transplant on 02/10/17 with no history of rejection or evidence of FSGS recurrence. Renal function is stable with serum creatinine today of 0.94 mg/dL within the baseline range of 0.9-1.1. UP/C remains normal. 2. Immunosuppression. Currently on tacrolimus 3mg /2mg   and myfortic 180mg  bid. Tac goal 6-10. Current level is 6.5. Will continue current immunosuppression.    3. Hypertension. On amlodipine 10mg  daily and metoprolol tartrate 100mg  bid. BP is 84/68 today and she is asymptomatic. Will reduce amlodipine to 5 mg daily.     4. Headaches, improved. No further imaging of the brain is planned.    5. Lower back and LE pain. Pain management clinic recommended MRI of lumbar spine to assess degeneration of lumbar area and radiculopathy however patient refused. I have advised her that an MRI would be beneficial. It is unclear if the MRI would be useful in view of prior spine placement of metal.  I will prescribe her Gabapentin today for pain.     6. Health Maintenance. Will plan for pneumococcal and shingrix vaccines at 1 year post-transplant    7. Follow up. 2 months. Continue weekly labs.     Scribe's Attestation: Jackey Loge, MD obtained and performed the history, physical exam and medical decision making elements that were entered into the chart.  Signed by Delaney Meigs, Scribe, on May 29, 2017 12:01 PM    ----------------------------------------------------------------------------------------------------------------------  May 29, 2017 10:19 PM. Documentation assistance provided by the Scribe. I was present during the time the encounter was recorded. The information recorded by the Scribe was done at my direction and has been reviewed and validated by me.  ----------------------------------------------------------------------------------------------------------------------

## 2017-05-30 LAB — CMV DNA, QUANTITATIVE, PCR

## 2017-05-30 LAB — CMV QUANT LOG10: Lab: 0

## 2017-06-04 DIAGNOSIS — M545 Low back pain, unspecified: Secondary | ICD-10-CM | POA: Insufficient documentation

## 2017-06-04 DIAGNOSIS — M5410 Radiculopathy, site unspecified: Secondary | ICD-10-CM | POA: Insufficient documentation

## 2017-06-04 MED ORDER — GABAPENTIN 100 MG CAPSULE
ORAL_CAPSULE | Freq: Three times a day (TID) | ORAL | 11 refills | 0.00000 days | Status: CP
Start: 2017-06-04 — End: 2018-06-04

## 2017-06-11 MED ORDER — TRAZODONE 50 MG TABLET
ORAL_TABLET | Freq: Every evening | ORAL | 1 refills | 0.00000 days | Status: CP
Start: 2017-06-11 — End: 2017-09-22

## 2017-06-11 MED ORDER — SERTRALINE 100 MG TABLET
ORAL_TABLET | Freq: Every day | ORAL | 3 refills | 0 days | Status: CP
Start: 2017-06-11 — End: 2019-02-06

## 2017-06-11 MED ORDER — VALGANCICLOVIR 450 MG TABLET
ORAL_TABLET | Freq: Every day | ORAL | 5 refills | 0 days | Status: CP
Start: 2017-06-11 — End: 2017-12-08

## 2017-06-18 MED ORDER — SULFAMETHOXAZOLE 400 MG-TRIMETHOPRIM 80 MG TABLET
ORAL_TABLET | ORAL | 5 refills | 0.00000 days | Status: CP
Start: 2017-06-18 — End: 2017-12-15

## 2017-07-02 MED ORDER — PROGRAF 1 MG CAPSULE: 2 mg | capsule | Freq: Two times a day (BID) | 11 refills | 0 days | Status: AC

## 2017-07-02 MED ORDER — AMLODIPINE 5 MG TABLET
ORAL_TABLET | Freq: Every day | ORAL | 11 refills | 0.00000 days | Status: CP
Start: 2017-07-02 — End: 2017-08-03

## 2017-07-02 MED ORDER — PROGRAF 1 MG CAPSULE
ORAL_CAPSULE | Freq: Two times a day (BID) | ORAL | 11 refills | 0.00000 days | Status: CP
Start: 2017-07-02 — End: 2017-07-11

## 2017-07-03 NOTE — Unmapped (Signed)
Patient requests call back for prograf sent over on 1/8 in 2 weeks. States get myfortic and valcyte at outside pharmacy, only needs prograf from [place]. I will set up call for 1/22.

## 2017-07-03 NOTE — Unmapped (Signed)
[facility] Specialty Medication Referral: No PA required    Medication (Brand/Generic): Prograf 1mg     Initial FSI Test Claim completed with resulted information below:  No PA required  Patient ABLE to fill at [facility] Company:  Med B  Anticipated Copay: $0.00  *Patient appears to be previously enrolled in SOT and had a last call attempt on 12/07.  *Re-enrolling & onboarding    As Co-pay is under $100 defined limit, per policy there will be no further investigation of need for financial assistance at this time unless patient requests. This referral has been communicated to the provider and handed off to the [facility] team for further processing and filling of prescribed medication.   ______________________________________________________________________  Please utilize this referral for viewing purposes as it will serve as the central location for all relevant documentation and updates.

## 2017-07-05 LAB — CBC W/ DIFFERENTIAL
BASOPHILS ABSOLUTE COUNT: 0 10*9/L — ABNORMAL LOW
BASOPHILS RELATIVE PERCENT: 0.7 %
EOSINOPHILS ABSOLUTE COUNT: 0.1 10*9/L
HEMATOCRIT: 38 %
HEMOGLOBIN: 13.1 g/dL
LYMPHOCYTES ABSOLUTE COUNT: 0.5 10*9/L — ABNORMAL LOW
LYMPHOCYTES RELATIVE PERCENT: 15 % — ABNORMAL LOW
MEAN CORPUSCULAR HEMOGLOBIN CONC: 34.6 g/dL
MEAN CORPUSCULAR HEMOGLOBIN: 31.4 pg
MEAN CORPUSCULAR VOLUME: 90.9 fL
MONOCYTES ABSOLUTE COUNT: 0.2 10*9/L
MONOCYTES RELATIVE PERCENT: 7.7 %
NEUTROPHILS ABSOLUTE COUNT: 2.2 10*9/L
NEUTROPHILS RELATIVE PERCENT: 72.2 %
PLATELET COUNT: 182 10*9/L
RED BLOOD CELL COUNT: 4.2 10*12/L
RED CELL DISTRIBUTION WIDTH: 13.4 %
WBC ADJUSTED: 3.1 10*9/L — ABNORMAL LOW

## 2017-07-05 LAB — MAGNESIUM: Lab: 1.4 — ABNORMAL LOW

## 2017-07-05 LAB — BASIC METABOLIC PANEL
BLOOD UREA NITROGEN: 20 mg/dL — ABNORMAL HIGH
CALCIUM: 9.7 mg/dL
CHLORIDE: 105 mmol/L
CO2: 24.7 mmol/L
CREATININE: 0.9 mg/dL
EGFR MDRD AF AMER: 83 mL/min/{1.73_m2}
GLUCOSE RANDOM: 97 mg/dL
SODIUM: 138 mmol/L

## 2017-07-05 LAB — PHOSPHORUS: Lab: 4.4

## 2017-07-05 LAB — MICROCYTES: Lab: 0

## 2017-07-05 LAB — CALCIUM: Lab: 9.7

## 2017-07-05 NOTE — Unmapped (Signed)
In basket message to Dr. Margaretmary Bayley regarding MRI Spine results (in media).  Left message for patient to review results.

## 2017-07-06 NOTE — Unmapped (Signed)
Reviewed plan to refer to [facility] with patient.  Referral faxed to [facility]- [phone]

## 2017-07-09 LAB — TACROLIMUS, TROUGH: Lab: 10.4

## 2017-07-11 MED ORDER — PROGRAF 1 MG CAPSULE
ORAL_CAPSULE | 11 refills | 0 days | Status: CP
Start: 2017-07-11 — End: 2017-07-27

## 2017-07-11 NOTE — Unmapped (Signed)
Reviewed tac of 10.1 with Dr. Nedra Hai.  Patient understands to reduce to 2 mg Am and 1 mg PM.  Going to once a week labs- next will be Monday.  Patient also states she has an appointment with [facility] on 1/28

## 2017-07-19 NOTE — Unmapped (Signed)
Returned patient call- she is getting labs tomorrow

## 2017-07-23 ENCOUNTER — Other Ambulatory Visit: Payer: Self-pay

## 2017-07-23 ENCOUNTER — Encounter: Payer: Self-pay | Admitting: Nurse Practitioner

## 2017-07-23 ENCOUNTER — Ambulatory Visit: Payer: Medicare Other | Attending: Nurse Practitioner | Admitting: Nurse Practitioner

## 2017-07-23 DIAGNOSIS — Z789 Other specified health status: Secondary | ICD-10-CM | POA: Diagnosis not present

## 2017-07-23 DIAGNOSIS — G8929 Other chronic pain: Secondary | ICD-10-CM | POA: Diagnosis not present

## 2017-07-23 DIAGNOSIS — Z886 Allergy status to analgesic agent status: Secondary | ICD-10-CM | POA: Insufficient documentation

## 2017-07-23 DIAGNOSIS — M5441 Lumbago with sciatica, right side: Secondary | ICD-10-CM | POA: Diagnosis not present

## 2017-07-23 DIAGNOSIS — M542 Cervicalgia: Secondary | ICD-10-CM | POA: Insufficient documentation

## 2017-07-23 DIAGNOSIS — Z981 Arthrodesis status: Secondary | ICD-10-CM | POA: Diagnosis not present

## 2017-07-23 DIAGNOSIS — M545 Low back pain: Secondary | ICD-10-CM | POA: Diagnosis present

## 2017-07-23 DIAGNOSIS — Z7982 Long term (current) use of aspirin: Secondary | ICD-10-CM | POA: Insufficient documentation

## 2017-07-23 DIAGNOSIS — Z79899 Other long term (current) drug therapy: Secondary | ICD-10-CM | POA: Diagnosis not present

## 2017-07-23 DIAGNOSIS — F418 Other specified anxiety disorders: Secondary | ICD-10-CM | POA: Diagnosis not present

## 2017-07-23 DIAGNOSIS — G894 Chronic pain syndrome: Secondary | ICD-10-CM

## 2017-07-23 DIAGNOSIS — D869 Sarcoidosis, unspecified: Secondary | ICD-10-CM | POA: Diagnosis not present

## 2017-07-23 DIAGNOSIS — M899 Disorder of bone, unspecified: Secondary | ICD-10-CM | POA: Diagnosis not present

## 2017-07-23 DIAGNOSIS — Z940 Kidney transplant status: Secondary | ICD-10-CM | POA: Insufficient documentation

## 2017-07-23 DIAGNOSIS — I509 Heart failure, unspecified: Secondary | ICD-10-CM | POA: Diagnosis not present

## 2017-07-23 DIAGNOSIS — M79605 Pain in left leg: Secondary | ICD-10-CM | POA: Diagnosis not present

## 2017-07-23 DIAGNOSIS — M5442 Lumbago with sciatica, left side: Secondary | ICD-10-CM | POA: Diagnosis not present

## 2017-07-23 DIAGNOSIS — I272 Pulmonary hypertension, unspecified: Secondary | ICD-10-CM | POA: Insufficient documentation

## 2017-07-23 DIAGNOSIS — I110 Hypertensive heart disease with heart failure: Secondary | ICD-10-CM | POA: Diagnosis not present

## 2017-07-23 DIAGNOSIS — M79604 Pain in right leg: Secondary | ICD-10-CM | POA: Diagnosis not present

## 2017-07-23 DIAGNOSIS — M2550 Pain in unspecified joint: Secondary | ICD-10-CM | POA: Insufficient documentation

## 2017-07-23 NOTE — Progress Notes (Signed)
Patient's Name: Vicki Mercer  MRN: [id number]  Referring Provider: Servando Snare, MD  DOB: 05-Apr-1976  PCP: [facility] [place]: 07/23/2017  Note by: Dionisio David NP  Service setting: Ambulatory outpatient  Specialty: Interventional Pain Management  Location: [facility] (AMB) Pain Management Facility    Patient type: New Patient    Primary Reason(s) for Visit: Initial Patient Evaluation CC: Back Pain (lower); Leg Pain (bilaterally); and Neck Pain (intermittant)  HPI  Vicki Mercer is a 42 y.o. year old, female patient, who comes today for an initial evaluation. She has Displacement of lumbar intervertebral disc; ESRD on dialysis ([place]); Anemia; Coag negative Staphylococcus bacteremia; Essential hypertension; Focal segmental glomerulosclerosis; Mixed anxiety and depressive disorder; Peritoneal dialysis status ([place]); Pulmonary hypertension ([place]); Thoracic or lumbosacral neuritis or radiculitis; Other long term (current) drug therapy; Other specified health status; Pain in unspecified joint; Disorder of bone, unspecified; Chronic pain syndrome; Chronic bilateral low back pain with bilateral sciatica (Primary Area of Pain) (Bilateral) (L>R); Chronic pain of lower extremity, bilateral (Secondary Area of Pain) (Bilateral) (L>R); and Chronic midline posterior neck pain (Tertiary Area of Pain) on their problem list.. Her primarily concern today is the Back Pain (lower); Leg Pain (bilaterally); and Neck Pain (intermittant)  Pain Assessment: Location: Right, Left, Medial, Lower Back Radiating: paim sometimes radiates through both hips down backs of both legs to calf area; Onset: More than a month ago Duration: Chronic pain Quality: Numbness, Constant Severity: 6 /10 (self-reported pain score)  Note: Reported level is compatible with observation. Clinically the patient looks like a 2/10 A 2/10 is viewed as "Mild to Moderate" and described as noticeable and distracting. Impossible to hide from other  people. More frequent flare-ups. Still possible to adapt and function close to normal. It can be very annoying and may have occasional stronger flare-ups. With discipline, patients may get used to it and adapt. Information on the proper use of the pain scale provided to the patient today. When using our objective Pain Scale, levels between 6 and 10/10 are said to belong in an emergency room, as it progressively worsens from a 6/10, described as severely limiting, requiring emergency care not usually available at an outpatient pain management facility. At a 6/10 level, communication becomes difficult and requires great effort. Assistance to reach the emergency department may be required. Facial flushing and profuse sweating along with potentially dangerous increases in heart rate and blood pressure will be evident. Effect on ADL: cannot stand for long periods of time before feeling like legs will "give out"; cannot go for extended car rides without getting out to take stretch breaks Timing: Constant Modifying factors: some relief with medications  Onset and Duration: Present longer than 3 months Cause of pain: "bad disk in back" Severity: Getting worse, NAS-11 at its worse: 10/10, NAS-11 at its best: 7/10, NAS-11 now: 10/10 and NAS-11 on the average: 8/10 Timing: Not influenced by the time of the day and During activity or exercise Aggravating Factors: Bending, Lifiting, Prolonged standing, Twisting and Walking Alleviating Factors: Medications and Sleeping Associated Problems: Depression, Fatigue, Sweating and Pain that wakes patient up Quality of Pain: Agonizing, Constant, Deep, Dreadful, Horrible and Uncomfortable Previous Examinations or Tests: MRI scan and X-rays Previous Treatments: Narcotic medications  The patient comes into the clinics today for the first time for a chronic pain management evaluation. According to the patient her primary area of pain is in her lower back. She admits that left  is Greater than the right. She is  status post spinal fusion in 1991 for treatment of scoliosis. She denies any interventional therapies or recent physical therapy. She admits that she does continue to the gym. She did not had any recent images.   Her second area of pain is in her lower extremities. She admits that the left is greater than the right. The pain does radiate down into her calf. She denies any numbness tingling. She admits that she does have occasional weakness. She denies any previous surgeries, interventional therapy or recent images.  Her third area of pain is in her neck. She denies any numbness tingling or weakness in upper extremities. She denies any previous surgeries, interventional therapy. She has had recent CT scan;  [place] [facility].  Today I took the time to provide the patient with information regarding this pain practice. The patient was informed that the practice is divided into two sections: an interventional pain management section, as well as a completely separate and distinct medication management section. I explained that there are procedure days for interventional therapies, and evaluation days for follow-ups and medication management. Because of the amount of documentation required during both, they are kept separated. This means that there is the possibility that she may be scheduled for a procedure on one day, and medication management the next. I have also informed her that because of staffing and facility limitations, this practice will no longer take patients for medication management only. To illustrate the reasons for this, I gave the patient the example of surgeons, and how inappropriate it would be to refer a patient to his/her care, just to write for the post-surgical antibiotics on a surgery done by a different surgeon.   Because interventional pain management is part of the board-certified specialty for the doctors, the patient was informed that joining this  practice means that they are open to any and all interventional therapies. I made it clear that this does not mean that they will be forced to have any procedures done. What this means is that I believe interventional therapies to be essential part of the diagnosis and proper management of chronic pain conditions. Therefore, patients not interested in these interventional alternatives will be better served under the care of a different practitioner.  The patient was also made aware of my Comprehensive Pain Management Safety Guidelines where by joining this practice, they limit all of their nerve blocks and joint injections to those done by our practice, for as long as we are retained to manage their care. Historic Controlled Substance Pharmacotherapy Review  PMP and historical list of controlled substances: Tramadol 50 mg, oxycodone 5 mg, oxycodone/acetaminophen 5/325 mg, hydrocodone/acetaminophen 5/325 mg, diphenoxylate atropine 2.5/0.025 Highest opioid analgesic regimen found: Oxycodone 5 mg 2 tablets every 4 hours (fill date 01/15/2017) oxycodone 60 mg per day Most recent opioid analgesic: Tramadol 50 mg 1 tablet 4 times daily (fill date 04/04/2017) tramadol 200 mg per day Current opioid analgesics: None Highest recorded MME/day: 90 mg/day MME/day: 0 mg/day Medications: The patient did not bring the medication(s) to the appointment, as requested in our "New Patient Package" Pharmacodynamics: Desired effects: Analgesia: The patient reports >50% benefit. Reported improvement in function: The patient reports medication allows her to accomplish basic ADLs. Clinically meaningful improvement in function (CMIF): Sustained CMIF goals met Perceived effectiveness: Described as relatively effective, allowing for increase in activities of daily living (ADL) Undesirable effects: Side-effects or Adverse reactions: None reported Historical Monitoring: The patient  reports that she does not use drugs. List  of all  UDS Test(s): No results found for: MDMA, COCAINSCRNUR, PCPSCRNUR, PCPQUANT, CANNABQUANT, THCU, ETH List of all Serum Drug Screening Test(s):  No results found for: AMPHSCRSER, BARBSCRSER, BENZOSCRSER, COCAINSCRSER, PCPSCRSER, PCPQUANT, THCSCRSER, CANNABQUANT, OPIATESCRSER, OXYSCRSER, PROPOXSCRSER Historical Background Evaluation: [employer] PDMP: Six (6) year initial data search conducted.             [place] Department of public safety, offender search: Editor, commissioning Information) Non-contributory Risk Assessment Profile: Aberrant behavior: None observed or detected today Risk factors for fatal opioid overdose: None identified today Fatal overdose hazard ratio (HR): Calculation deferred Non-fatal overdose hazard ratio (HR): Calculation deferred Risk of opioid abuse or dependence: 0.7-3.0% with doses ? 36 MME/day and 6.1-26% with doses ? 120 MME/day. Substance use disorder (SUD) risk level: Pending results of Medical Psychology Evaluation for SUD Opioid risk tool (ORT) (Total Score): 2  ORT Scoring interpretation table:  Score <3 = Low Risk for SUD  Score between 4-7 = Moderate Risk for SUD  Score >8 = High Risk for Opioid Abuse   PHQ-2 Depression Scale:  Total score: 0  PHQ-2 Scoring interpretation table: (Score and probability of major depressive disorder)  Score 0 = No depression  Score 1 = 15.4% Probability  Score 2 = 21.1% Probability  Score 3 = 38.4% Probability  Score 4 = 45.5% Probability  Score 5 = 56.4% Probability  Score 6 = 78.6% Probability   PHQ-9 Depression Scale:  Total score: 0  PHQ-9 Scoring interpretation table:  Score 0-4 = No depression  Score 5-9 = Mild depression  Score 10-14 = Moderate depression  Score 15-19 = Moderately severe depression  Score 20-27 = Severe depression (2.4 times higher risk of SUD and 2.89 times higher risk of overuse)   Pharmacologic Plan: Pending ordered tests and/or consults  Meds  The patient has a current medication list which includes  the following prescription(s): acetaminophen, amlodipine, aspirin ec, cyclobenzaprine, famotidine, gabapentin, loratadine, metoprolol tartrate, mycophenolate, polyethylene glycol, sertraline, tacrolimus, and trazodone hcl.  Current Outpatient Medications on File Prior to Visit  Medication Sig  . acetaminophen (TYLENOL) 500 MG tablet Take 500 mg by mouth every 6 (six) hours as needed.  Marland Kitchen amLODipine (NORVASC) 5 MG tablet Take 10 mg by mouth daily.  Marland Kitchen aspirin EC 81 MG tablet Take 81 mg by mouth daily.  . cyclobenzaprine (FLEXERIL) 10 MG tablet Take 10 mg by mouth 2 (two) times daily.  . famotidine (PEPCID) 20 MG tablet Take 20 mg by mouth every morning.  . gabapentin (NEURONTIN) 100 MG capsule Take 200 mg by mouth 3 (three) times daily.  Marland Kitchen loratadine (CLARITIN) 10 MG tablet Take 10 mg by mouth daily as needed for allergies.  . metoprolol tartrate (LOPRESSOR) 100 MG tablet Take 100 mg by mouth 2 (two) times daily.  . mycophenolate (MYFORTIC) 180 MG EC tablet Take 180 mg by mouth 2 (two) times daily.  . polyethylene glycol (MIRALAX / GLYCOLAX) packet Take 17 g by mouth daily as needed.  . sertraline (ZOLOFT) 100 MG tablet Take 100 mg by mouth daily.  . tacrolimus (PROGRAF) 1 MG capsule Take 1 mg by mouth 2 (two) times daily.  . TRAZODONE HCL PO Take 50 mg/day by mouth daily.   No current facility-administered medications on file prior to visit.    Imaging Review  Note: No new results found.        ROS  Cardiovascular History: Daily Aspirin intake, High blood pressure and Heart failure Pulmonary or Respiratory History: Sarcoidosis Neurological History: Curved spine Review of  Past Neurological Studies: No results found for this or any previous visit. Psychological-Psychiatric History: Depressed Gastrointestinal History: No reported gastrointestinal signs or symptoms such as vomiting or evacuating blood, reflux, heartburn, alternating episodes of diarrhea and constipation, inflamed or scarred  liver, or pancreas or irrregular and/or infrequent bowel movements Genitourinary History: Kidney disease and Dialysis Hematological History: No reported hematological signs or symptoms such as prolonged bleeding, low or poor functioning platelets, bruising or bleeding easily, hereditary bleeding problems, low energy levels due to low hemoglobin or being anemic Endocrine History: No reported endocrine signs or symptoms such as high or low blood sugar, rapid heart rate due to high thyroid levels, obesity or weight gain due to slow thyroid or thyroid disease Rheumatologic History: No reported rheumatological signs and symptoms such as fatigue, joint pain, tenderness, swelling, redness, heat, stiffness, decreased range of motion, with or without associated rash Musculoskeletal History: Negative for myasthenia gravis, muscular dystrophy, multiple sclerosis or malignant hyperthermia Work History: Working part time and Disabled  Allergies  Vicki Mercer is allergic to ibuprofen.  Laboratory Chemistry  Inflammation Markers No results found for: CRP, ESRSEDRATE (CRP: Acute Phase) (ESR: Chronic Phase) Renal Function Markers Lab Results  Component Value Date   BUN 65 (H) 08/04/2016   CREATININE 10.35 (H) 08/04/2016   GFRAA 5 (L) 08/04/2016   GFRNONAA 4 (L) 08/04/2016   Hepatic Function Markers Lab Results  Component Value Date   ALBUMIN 3.8 05/04/2016   Electrolytes Lab Results  Component Value Date   NA 137 08/04/2016   K 3.4 (L) 08/04/2016   CL 100 (L) 08/04/2016   CALCIUM 7.6 (L) 08/04/2016   Neuropathy Markers No results found for: [id number] Bone Pathology Markers Lab Results  Component Value Date   CALCIUM 7.6 (L) 08/04/2016   Coagulation Parameters Lab Results  Component Value Date   PLT 157 08/04/2016   Cardiovascular Markers Lab Results  Component Value Date   HGB 6.5 (L) 08/04/2016   HCT 18.7 (L) 08/04/2016   Note: Lab results reviewed.  [place]  Drug: Vicki Mercer   reports that she does not use drugs. Alcohol:  reports that she does not drink alcohol. Tobacco:  reports that  has never smoked. she has never used smokeless tobacco. Medical:  has a past medical history of Anemia, Hypertension, and Renal disorder. Family: family history is not on file.  Past Surgical History:  Procedure Laterality Date  . AV FISTULA PLACEMENT Right 2014   forearm  . KIDNEY TRANSPLANT Right 01/11/2017   at [facility]; original kidneys remain  . SPINE SURGERY  1991   [facility]   Active Ambulatory Problems    Diagnosis Date Noted  . Displacement of lumbar intervertebral disc 06/13/2011  . ESRD on dialysis ([place]) 11/11/2013  . Anemia 07/03/2013  . Coag negative Staphylococcus bacteremia 03/18/2016  . Essential hypertension 10/23/1995  . Focal segmental glomerulosclerosis 10/23/1995  . Mixed anxiety and depressive disorder 09/02/2014  . Peritoneal dialysis status ([place]) 09/10/2014  . Pulmonary hypertension ([place]) 01/27/2015  . Thoracic or lumbosacral neuritis or radiculitis 05/29/2011  . Other long term (current) drug therapy 07/23/2017  . Other specified health status 07/23/2017  . Pain in unspecified joint 07/23/2017  . Disorder of bone, unspecified 07/23/2017  . Chronic pain syndrome 07/23/2017  . Chronic bilateral low back pain with bilateral sciatica (Primary Area of Pain) (Bilateral) (L>R) 07/23/2017  . Chronic pain of lower extremity, bilateral (Secondary Area of Pain) (Bilateral) (L>R) 07/23/2017  . Chronic midline posterior  neck pain [facility] Area of Pain) 07/23/2017   Resolved Ambulatory Problems    Diagnosis Date Noted  . No Resolved Ambulatory Problems   Past Medical History:  Diagnosis Date  . Anemia   . Hypertension   . Renal disorder    Constitutional Exam  General appearance: Well nourished, well developed, and well hydrated. In no apparent acute distress Vitals:   07/23/17 1027  BP: 111/70  Pulse: 71  Resp: 16   Temp: 98.2 F (36.8 C)  TempSrc: Oral  SpO2: 99%  Weight: 165 lb (74.8 kg)  Height: _0  (1.499 m)   BMI Assessment: Estimated body mass index is 33.33 kg/m as calculated from the following:   Height as of this encounter: _1  (1.499 m).   Weight as of this encounter: 165 lb (74.8 kg).  BMI interpretation table: BMI level Category Range association with higher incidence of chronic pain  <18 kg/m2 Underweight   18.5-24.9 kg/m2 Ideal body weight   25-29.9 kg/m2 Overweight Increased incidence by 20%  30-34.9 kg/m2 Obese (Class I) Increased incidence by 68%  35-39.9 kg/m2 Severe obesity (Class II) Increased incidence by 136%  >40 kg/m2 Extreme obesity (Class III) Increased incidence by 254%   BMI Readings from Last 4 Encounters:  07/23/17 33.33 kg/m  08/04/16 27.67 kg/m   Wt Readings from Last 4 Encounters:  07/23/17 165 lb (74.8 kg)  08/04/16 137 lb (62.1 kg)  Psych/Mental status: Alert, oriented x 3 (person, place, & time)       Eyes: PERLA Respiratory: No evidence of acute respiratory distress  Cervical Spine Exam  Inspection: No masses, redness, or swelling Alignment: Symmetrical Functional ROM: Unrestricted ROM      Stability: No instability detected Muscle strength & Tone: Functionally intact Sensory: Unimpaired Palpation: No palpable anomalies              Upper Extremity (UE) Exam    Side: Right upper extremity  Side: Left upper extremity  Inspection: No masses, redness, swelling, or asymmetry. No contractures  Inspection: No masses, redness, swelling, or asymmetry. No contractures  Functional ROM: Unrestricted ROM          Functional ROM: Unrestricted ROM          Muscle strength & Tone: Functionally intact  Muscle strength & Tone: Functionally intact  Sensory: Unimpaired  Sensory: Unimpaired  Palpation: No palpable anomalies              Palpation: No palpable anomalies              Specialized Test(s): Deferred         Specialized Test(s): Deferred           Thoracic Spine Exam  Inspection: No masses, redness, or swelling Alignment: Symmetrical Functional ROM: Unrestricted ROM Stability: No instability detected Sensory: Unimpaired Muscle strength & Tone: No palpable anomalies  Lumbar Spine Exam  Inspection: Thoraco-lumbar Scoliosis all well-heal scar from previous surgery Alignment: Asymmetric Functional ROM: Restricted ROM      Stability: No instability detected Muscle strength & Tone: Increased muscle tone over affected area Sensory: Hypoesthesia/Hypesthesia (Reduced sensation to touch) Palpation: Complains of area being tender to palpation       Provocative Tests: Lumbar Hyperextension and rotation test: Unable to perform due to fusion restriction. Patrick's Maneuver: Unable to perform                    Gait & Posture Assessment  Ambulation: Unassisted Gait: Cautious Posture: Thoracic kyphosis  Lower Extremity Exam    Side: Right lower extremity  Side: Left lower extremity  Inspection: No masses, redness, swelling, or asymmetry. No contractures  Inspection: No masses, redness, swelling, or asymmetry. No contractures  Functional ROM: Unrestricted ROM          Functional ROM: Unrestricted ROM          Muscle strength & Tone: Functionally intact  Muscle strength & Tone: Functionally intact  Sensory: Unimpaired  Sensory: Unimpaired  Palpation: No palpable anomalies  Palpation: No palpable anomalies   Assessment  Primary Diagnosis & Pertinent Problem List: Diagnoses of Chronic bilateral low back pain with bilateral sciatica, Chronic pain of lower extremity, bilateral (Secondary Area of Pain) (Bilateral) (L>R), Chronic midline posterior neck pain (Tertiary Area of Pain), Chronic pain syndrome, Other long term (current) drug therapy, Other specified health status, and Disorder of bone, unspecified were pertinent to this visit.  Visit Diagnosis: 1. Chronic bilateral low back pain with bilateral sciatica   2. Chronic pain of lower  extremity, bilateral (Secondary Area of Pain) (Bilateral) (L>R)   3. Chronic midline posterior neck pain (Tertiary Area of Pain)   4. Chronic pain syndrome   5. Other long term (current) drug therapy   6. Other specified health status   7. Disorder of bone, unspecified    Plan of Care  Initial treatment plan:  Please be advised that as per protocol, today's visit has been an evaluation only. We have not taken over the patient's controlled substance management.  Problem-specific plan: No problem-specific Assessment & Plan notes found for this encounter.  Ordered Lab-work, Procedure(s), Referral(s), & Consult(s): Orders Placed This Encounter  Procedures  . DG Lumbar Spine Complete W/Bend  . Compliance Drug Analysis, Ur  . Comp. Metabolic Panel (12)  . Magnesium  . Vitamin B12  . Sedimentation rate  . 25-Hydroxyvitamin D Lcms D2+D3  . C-reactive protein  . Ambulatory referral to Psychology   Pharmacotherapy: Medications ordered:  No orders of the defined types were placed in this encounter.  Medications administered during this visit: Harrel Lemon had no medications administered during this visit.   Pharmacotherapy under consideration:  Opioid Analgesics: The patient was informed that there is no guarantee that she would be a candidate for opioid analgesics. The decision will be made following CDC guidelines. This decision will be based on the results of diagnostic studies, as well as Vicki Mercer's risk profile.  Membrane stabilizer: To be determined at a later time Muscle relaxant: To be determined at a later time NSAID: To be determined at a later time Other analgesic(s): To be determined at a later time   Interventional therapies under consideration: Vicki Mercer was informed that there is no guarantee that she would be a candidate for interventional therapies. The decision will be based on the results of diagnostic studies, as well as Vicki Mercer's risk profile.  Possible  procedure(s): Diagnostic bilateral LESI Diagnostic bilateral lumbar facet nerve block Possible bilateral lumbar facet RFA Diagnostic midline cervical facet nerve block Diagnostic cervical facet RFA Diagnostic trigger point injections    Provider-requested follow-up: Return for 2nd Visit, w/ Dr. Dossie Arbour, after [facility] eval.  No future appointments.  Primary Care Physician: [facility] [place] Location: [facility] Note by:  Date: 07/23/2017; Time: 3:42 PM  Pain Score Disclaimer: We use the NRS-11 scale. This is a self-reported, subjective measurement of pain severity with only modest accuracy. It is used primarily to identify changes within a particular patient. It  must be understood that outpatient pain scales are significantly less accurate that those used for research, where they can be applied under ideal controlled circumstances with minimal exposure to variables. In reality, the score is likely to be a combination of pain intensity and pain affect, where pain affect describes the degree of emotional arousal or changes in action readiness caused by the sensory experience of pain. Factors such as social and work situation, setting, emotional state, anxiety levels, expectation, and prior pain experience may influence pain perception and show large inter-individual differences that may also be affected by time variables.  Patient instructions provided during this appointment: Patient Instructions    ____________________________________________________________________________________________  Appointment Policy Summary  It is our goal and responsibility to provide the medical community with assistance in the evaluation and management of patients with chronic pain. Unfortunately our resources are limited. Because we do not have an unlimited amount of time, or available appointments, we are required to closely monitor and manage their use. The  following rules exist to maximize their use:  Patient's responsibilities: 1. Punctuality:  At what time should I arrive? You should be physically present in our office 30 minutes before your scheduled appointment. Your scheduled appointment is with your assigned healthcare provider. However, it takes 5-10 minutes to be "checked-in", and another 15 minutes for the nurses to do the admission. If you arrive to our office at the time you were given for your appointment, you will end up being at least 20-25 minutes late to your appointment with the provider. 2. Tardiness:  What happens if I arrive only a few minutes after my scheduled appointment time? You will need to reschedule your appointment. The cutoff is your appointment time. This is why it is so important that you arrive at least 30 minutes before that appointment. If you have an appointment scheduled for 10:00 AM and you arrive at 10:01, you will be required to reschedule your appointment.  3. Plan ahead:  Always assume that you will encounter traffic on your way in. Plan for it. If you are dependent on a driver, make sure they understand these rules and the need to arrive early. 4. Other appointments and responsibilities:  Avoid scheduling any other appointments before or after your pain clinic appointments.  5. Be prepared:  Write down everything that you need to discuss with your healthcare provider and give this information to the admitting nurse. Write down the medications that you will need refilled. Bring your pills and bottles (even the empty ones), to all of your appointments, except for those where a procedure is scheduled. 6. No children or pets:  Find someone to take care of them. It is not appropriate to bring them in. 7. Scheduling changes:  We request "advanced notification" of any changes or cancellations. 8. Advanced notification:  Defined as a time period of more than 24 hours prior to the originally scheduled appointment.  This allows for the appointment to be offered to other patients. 9. Rescheduling:  When a visit is rescheduled, it will require the cancellation of the original appointment. For this reason they both fall within the category of "Cancellations".  10. Cancellations:  They require advanced notification. Any cancellation less than 24 hours before the  appointment will be recorded as a "No Show". 11. No Show:  Defined as an unkept appointment where the patient failed to notify or declare to the practice their intention or inability to keep the appointment.  Corrective process for repeat offenders:  1.  Tardiness: Three (3) episodes of rescheduling due to late arrivals will be recorded as one (1) "No Show". 2. Cancellation or reschedule: Three (3) cancellations or rescheduling will be recorded as one (1) "No Show". 3. "No Shows": Three (3) "No Shows" within a 12 month period will result in discharge from the practice.  ____________________________________________________________________________________________   ____________________________________________________________________________________________  Pain Scale  Introduction: The pain score used by this practice is the Verbal Numerical Rating Scale (VNRS-11). This is an 11-point scale. It is for adults and children 10 years or older. There are significant differences in how the pain score is reported, used, and applied. Forget everything you learned in the past and learn this scoring system.  General Information: The scale should reflect your current level of pain. Unless you are specifically asked for the level of your worst pain, or your average pain. If you are asked for one of these two, then it should be understood that it is over the past 24 hours.  Basic Activities of Daily Living (ADL): Personal hygiene, dressing, eating, transferring, and using restroom.  Instructions: Most patients tend to report their level of pain as a combination  of two factors, their physical pain and their psychosocial pain. This last one is also known as "suffering" and it is reflection of how physical pain affects you socially and psychologically. From now on, report them separately. From this point on, when asked to report your pain level, report only your physical pain. Use the following table for reference.  Pain Clinic Pain Levels (0-5/10)  Pain Level Score  Description  No Pain 0   Mild pain 1 Nagging, annoying, but does not interfere with basic activities of daily living (ADL). Patients are able to eat, bathe, get dressed, toileting (being able to get on and off the toilet and perform personal hygiene functions), transfer (move in and out of bed or a chair without assistance), and maintain continence (able to control bladder and bowel functions). Blood pressure and heart rate are unaffected. A normal heart rate for a healthy adult ranges from 60 to 100 bpm (beats per minute).   Mild to moderate pain 2 Noticeable and distracting. Impossible to hide from other people. More frequent flare-ups. Still possible to adapt and function close to normal. It can be very annoying and may have occasional stronger flare-ups. With discipline, patients may get used to it and adapt.   Moderate pain 3 Interferes significantly with activities of daily living (ADL). It becomes difficult to feed, bathe, get dressed, get on and off the toilet or to perform personal hygiene functions. Difficult to get in and out of bed or a chair without assistance. Very distracting. With effort, it can be ignored when deeply involved in activities.   Moderately severe pain 4 Impossible to ignore for more than a few minutes. With effort, patients may still be able to manage work or participate in some social activities. Very difficult to concentrate. Signs of autonomic nervous system discharge are evident: dilated pupils (mydriasis); mild sweating (diaphoresis); sleep interference. Heart rate  becomes elevated (>115 bpm). Diastolic blood pressure (lower number) rises above 100 mmHg. Patients find relief in laying down and not moving.   Severe pain 5 Intense and extremely unpleasant. Associated with frowning face and frequent crying. Pain overwhelms the senses.  Ability to do any activity or maintain social relationships becomes significantly limited. Conversation becomes difficult. Pacing back and forth is common, as getting into a comfortable position is nearly impossible. Pain wakes you up  from deep sleep. Physical signs will be obvious: pupillary dilation; increased sweating; goosebumps; brisk reflexes; cold, clammy hands and feet; nausea, vomiting or dry heaves; loss of appetite; significant sleep disturbance with inability to fall asleep or to remain asleep. When persistent, significant weight loss is observed due to the complete loss of appetite and sleep deprivation.  Blood pressure and heart rate becomes significantly elevated. Caution: If elevated blood pressure triggers a pounding headache associated with blurred vision, then the patient should immediately seek attention at an urgent or emergency care unit, as these may be signs of an impending stroke.    Emergency Department Pain Levels (6-10/10)  Emergency Room Pain 6 Severely limiting. Requires emergency care and should not be seen or managed at an outpatient pain management facility. Communication becomes difficult and requires great effort. Assistance to reach the emergency department may be required. Facial flushing and profuse sweating along with potentially dangerous increases in heart rate and blood pressure will be evident.   Distressing pain 7 Self-care is very difficult. Assistance is required to transport, or use restroom. Assistance to reach the emergency department will be required. Tasks requiring coordination, such as bathing and getting dressed become very difficult.   Disabling pain 8 Self-care is no longer  possible. At this level, pain is disabling. The individual is unable to do even the most "basic" activities such as walking, eating, bathing, dressing, transferring to a bed, or toileting. Fine motor skills are lost. It is difficult to think clearly.   Incapacitating pain 9 Pain becomes incapacitating. Thought processing is no longer possible. Difficult to remember your own name. Control of movement and coordination are lost.   The worst pain imaginable 10 At this level, most patients pass out from pain. When this level is reached, collapse of the autonomic nervous system occurs, leading to a sudden drop in blood pressure and heart rate. This in turn results in a temporary and dramatic drop in blood flow to the brain, leading to a loss of consciousness. Fainting is one of the body's self defense mechanisms. Passing out puts the brain in a calmed state and causes it to shut down for a while, in order to begin the healing process.    Summary: 1. Refer to this scale when providing [place] with your pain level. 2. Be accurate and careful when reporting your pain level. This will help with your care. 3. Over-reporting your pain level will lead to loss of credibility. 4. Even a level of 1/10 means that there is pain and will be treated at our facility. 5. High, inaccurate reporting will be documented as "Symptom Exaggeration", leading to loss of credibility and suspicions of possible secondary gains such as obtaining more narcotics, or wanting to appear disabled, for fraudulent reasons. 6. Only pain levels of 5 or below will be seen at our facility. 7. Pain levels of 6 and above will be sent to the Emergency Department and the appointment cancelled. ____________________________________________________________________________________________

## 2017-07-23 NOTE — Patient Instructions (Signed)

## 2017-07-23 NOTE — Progress Notes (Signed)
Safety precautions to be maintained throughout the outpatient stay will include: orient to surroundings, keep bed in low position, maintain call bell within reach at all times, provide assistance with transfer out of bed and ambulation.  

## 2017-07-25 LAB — CBC W/ DIFFERENTIAL
BASOPHILS ABSOLUTE COUNT: 0 10*9/L — ABNORMAL LOW
BASOPHILS RELATIVE PERCENT: 0.5 %
EOSINOPHILS ABSOLUTE COUNT: 0.1 10*9/L
EOSINOPHILS RELATIVE PERCENT: 3.7 %
HEMOGLOBIN: 13.1 g/dL
LYMPHOCYTES ABSOLUTE COUNT: 0.4 10*9/L — ABNORMAL LOW
LYMPHOCYTES RELATIVE PERCENT: 12.7 % — ABNORMAL LOW
MEAN CORPUSCULAR HEMOGLOBIN: 31.3 pg
MEAN PLATELET VOLUME: 7.8 fL
MONOCYTES ABSOLUTE COUNT: 0.4 10*9/L
MONOCYTES RELATIVE PERCENT: 13.2 % — ABNORMAL HIGH
NEUTROPHILS ABSOLUTE COUNT: 2.2 10*9/L
NEUTROPHILS RELATIVE PERCENT: 69.9 %
PLATELET COUNT: 214 10*9/L
RED BLOOD CELL COUNT: 4.2 10*12/L
RED CELL DISTRIBUTION WIDTH: 14.2 %
WHITE BLOOD CELL COUNT: 3.2 10*9/L — ABNORMAL LOW

## 2017-07-25 LAB — PHOSPHORUS: Lab: 3.6

## 2017-07-25 LAB — BASIC METABOLIC PANEL
BLOOD UREA NITROGEN: 23 mg/dL — ABNORMAL HIGH
CALCIUM: 9.3 mg/dL
CHLORIDE: 106 mmol/L
CO2: 25.2 mmol/L
CREATININE: 0.9 mg/dL
EGFR MDRD AF AMER: 83 mL/min/{1.73_m2}
SODIUM: 141 mmol/L

## 2017-07-25 LAB — MAGNESIUM: Lab: 1.5 — ABNORMAL LOW

## 2017-07-25 LAB — WHITE BLOOD CELL COUNT: Lab: 3.2 — ABNORMAL LOW

## 2017-07-25 LAB — CALCIUM: Lab: 9.3

## 2017-07-27 LAB — TACROLIMUS, TROUGH: Lab: 3

## 2017-07-27 LAB — COMP. METABOLIC PANEL (12)
A/G RATIO: 1.5 (ref 1.2–2.2)
ALBUMIN: 4.1 g/dL (ref 3.5–5.5)
AST: 33 IU/L (ref 0–40)
Alkaline Phosphatase: 104 IU/L (ref 39–117)
BUN / CREAT RATIO: 26 — AB (ref 9–23)
BUN: 19 mg/dL (ref 6–24)
Bilirubin Total: <0.2 mg/dL (ref 0.0–1.2)
CALCIUM: 9.5 mg/dL (ref 8.7–10.2)
CHLORIDE: 108 mmol/L — AB (ref 96–106)
Creatinine, Ser: 0.73 mg/dL (ref 0.57–1.00)
GFR calc non Af Amer: 102 mL/min/{1.73_m2} (ref >59)
GFR, EST AFRICAN AMERICAN: 117 mL/min/{1.73_m2} (ref >59)
GLOBULIN, TOTAL: 2.7 g/dL (ref 1.5–4.5)
Glucose: 82 mg/dL (ref 65–99)
POTASSIUM: 4.5 mmol/L (ref 3.5–5.2)
SODIUM: 143 mmol/L (ref 134–144)
Total Protein: 6.8 g/dL (ref 6.0–8.5)

## 2017-07-27 LAB — 25-HYDROXY VITAMIN D LCMS D2+D3
25-Hydroxy, Vitamin D-2: <1 ng/mL
25-Hydroxy, Vitamin D-3: 9.1 ng/mL
25-Hydroxy, Vitamin D: 9.9 ng/mL — ABNORMAL LOW

## 2017-07-27 LAB — MAGNESIUM: Magnesium: 1.7 mg/dL (ref 1.6–2.3)

## 2017-07-27 LAB — C-REACTIVE PROTEIN: CRP: 0.9 mg/L (ref 0.0–4.9)

## 2017-07-27 LAB — SEDIMENTATION RATE: SED RATE: 14 mm/h (ref 0–32)

## 2017-07-27 LAB — VITAMIN B12: Vitamin B-12: 1402 pg/mL — ABNORMAL HIGH (ref 232–1245)

## 2017-07-27 MED ORDER — PROGRAF 1 MG CAPSULE
ORAL_CAPSULE | Freq: Two times a day (BID) | ORAL | 11 refills | 0 days | Status: CP
Start: 2017-07-27 — End: 2017-08-03

## 2017-07-27 NOTE — Unmapped (Signed)
Reviewed tac with Dr. Nedra Hai.  Patient understands to increase to 3 mg AM and 3 mg PM

## 2017-07-28 LAB — COMPLIANCE DRUG ANALYSIS, UR

## 2017-07-30 NOTE — Unmapped (Signed)
[facility] Refill and Clinical Coordination Note  Medication(s): PROGRAF, MYFORTIC  **patient has been on this med since July 2018, filled with [place] up through end of August, then was getting at outside pharmacy, now back to [place], DOB: [birth year]  Phone: [phone] (home) , Alternate phone contact: N/A  Shipping address: [address]  [place] [address]  Phone or address changes today?: No  All above HIPAA information verified.  Insurance changes? No    Completed refill and clinical call assessment today to schedule patient's medication shipment from the [facility] [phone]).      MEDICATION RECONCILIATION    Confirmed the medication and dosage are correct and have not changed: No, patient reports changes to the regimen as follows: PROGRAF ON 3MG  [id number]    Were there any changes to your medication(s) in the past month:  No, there are no changes reported at this time.    ADHERENCE    Is this medicine transplant or covered by Medicare Part B? Yes.    Prograf 1 mg   Quantity filled last month: 150 (not filled with [place] last time)   # of tablets left on hand: 1 week left      Myfortic 180 mg   Quantity filled last month: 60 (not filled [place] last time)   # of tablets left on hand: 1 week left      Did you miss any doses in the past 4 weeks? No missed doses reported.  Adherence counseling provided? Not needed     SIDE EFFECT MANAGEMENT    Are you tolerating your medication?:  Gina Hart reports side effects of memory issues - she states this has been going on for over a month and she has talked to [place] txp coordinator about this in past, but is still very concerned (states no depression, 1 sided weakness, depression, eye sight change,etc). She states she has appointment tomorrow at clinic so I will route message to txp coordinator and CPP on this as well to make sure it is brought up at appt tomorrow..  Side effect management discussed: sending message to txp coordinator and CPP today = pt has appt tomorrow.     *we covered common and severe side effects briefly on both meds - patient states she is aware of all this and declined further education*      Therapy is appropriate and should be continued.    Evidence of clinical benefit: See Epic note from 05/29/17      FINANCIAL/SHIPPING    Delivery Scheduled: Yes, Expected medication delivery date: 08/02/17   Additional medications refilled: No additional medications/refills needed at this time.    Gina Hart did not have any additional questions at this time.    Delivery address validated in [facility] scheduling system: Yes, address listed above is correct.      We will follow up with patient monthly for standard refill processing and delivery.      Thank you,  Thad Ranger   [facility] Specialty Pharmacist

## 2017-07-30 NOTE — Unmapped (Signed)
Transplant Nephrology Clinic Visit    History of Present Illness  Gina Hart is a 42 y.o. female who is s/p deceased kidney transplant on January 29, 2017. She has experienced no episodes of rejection with normal urinary protein and baseline creatinine <1.0 mg/dL.     She presents today with complaints of recent memory lapses. She endorses that these memory issues are affecting her quality of life. She takes tramadol infrequently and no other opioids.  She only takes her Flexeril a couple times per week. She reduced her gabapentin to twice per day with no impact on her memory. On 07/02/17 she was instructed to reduce her tacrolimus dose to 2 mg BID due to an elevated level and on 07/11/17 she was instructed to further decrease tacrolimus to 2 mg AM and 1 mg PM for a level of 10.4. On  07/27/17 she was instructed to increase her tacrolimus dose to 3 mg BID and level today is 11.2. Her memory problems have not correlated with tacrolimus levels.  She reports occasional headaches.  Her home blood pressures usually run 120s/70s.    She continues to have back pain. On 06/29/17, patient underwent an MRI of her lower back which revealed a large central posteror disc bulge at L5-S1 resulting in severe central canal stenosis, central posterior disc protrusion with mild bilateral lateral recess stenosis at L4-L5.     Transplant History:  1. 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  2. S/P kidney transplant at [facility] January 20, 2107. Deceased donor with KDPI 19%. CMV D+/R-; EBV D+/R+  3. Immunosuppression = campath induction followed by tacrolimus and myfortic maintenance therapy, steroid free regimen.   4. Tacrolimus changed to cyclosporine 07/31/2017 due to neurotoxicity with memory deficits as primary manifestation.     Past Medical History:  1. ESRD secondary to FSGS.   2. Hypertension  3. Scoliosis  S/P kidney transplant as stated above    Review of Systems    All other systems are reviewed and are negative. Medications  Current Outpatient Prescriptions   Medication Sig Dispense Refill   ??? acetaminophen (TYLENOL) 325 MG tablet Take 1-2 tablets (325-650 mg total) by mouth every four (4) hours as needed for pain. 100 tablet 0   ??? amLODIPine (NORVASC) 5 MG tablet Take 2 tablets (10 mg total) by mouth daily. 60 tablet 11   ??? aspirin (ECOTRIN) 81 MG tablet Take 1 tablet (81 mg total) by mouth daily. 30 tablet 0   ??? cyclobenzaprine (FLEXERIL) 10 MG tablet Take 1 tablet (10 mg total) by mouth two (2) times a day as needed for muscle spasms. 45 tablet 0   ??? gabapentin (NEURONTIN) 100 MG capsule Take 2 capsules (200 mg total) by mouth Three (3) times a day. 180 capsule 11   ??? loratadine (CLARITIN) 10 mg tablet Take 10 mg by mouth daily as needed for allergies.     ??? metoprolol tartrate (LOPRESSOR) 100 MG tablet Take 1 tablet (100 mg total) by mouth Two (2) times a day. 180 tablet 3   ??? MYFORTIC 180 mg EC tablet Take 1 tablet (180 mg total) by mouth Two (2) times a day. 60 tablet 11   ??? PROGRAF 1 mg capsule Take 3 capsules (3 mg total) by mouth every twelve (12) hours. 180 capsule 11   ??? ranitidine (ZANTAC) 150 MG capsule Take 1 capsule (150 mg total) by mouth Two (2) times a day. 180 capsule 3   ??? sertraline (ZOLOFT) 100 MG tablet Take  1 tablet (100 mg total) by mouth daily. 90 tablet 3   ??? sulfamethoxazole-trimethoprim (BACTRIM,SEPTRA) 400-80 mg per tablet Take 1 tablet (80 mg of trimethoprim total) by mouth Every Monday, Wednesday, and Friday. 12 tablet 5   ??? traMADol (ULTRAM) 50 mg tablet Take 50 mg by mouth every six (6) hours as needed for pain.     ??? traZODone (DESYREL) 50 MG tablet Take 1 tablet (50 mg total) by mouth nightly. 90 tablet 1   ??? valGANciclovir (VALCYTE) 450 mg tablet Take 2 tablets (900 mg total) by mouth daily. 60 tablet 5     No current facility-administered medications for this visit.        Physical Exam    BP 92/62 (BP Site: L Arm, BP Position: Sitting, BP Cuff Size: Medium)  - Pulse 64  - Temp 36.2 ??C (97.2 ??F) (Temporal)  - Ht 149.9 cm (4' 11.02)  - Wt 76.2 kg (168 lb)   General: well-appearing, in no acute distress  HEENT: anicteric sclera, OP clear  CV: RRR, no m/r/g, no edema  Lungs: CTAB, normal wob  Abd: soft, non-tender, non-distended  MSK: no visible joint swelling  Skin: no visible lesions or rashes  Psych: alert, engaged, appropriate mood and affect     Laboratory Results  Recent Results (from the past 170 hour(s))   Magnesium Level    Collection Time: 07/31/17 11:23 AM   Result Value Ref Range    Magnesium 1.5 (L) 1.6 - 2.2 mg/dL   Phosphorus Level    Collection Time: 07/31/17 11:23 AM   Result Value Ref Range    Phosphorus 4.1 2.9 - 4.7 mg/dL   Tacrolimus Level, Trough    Collection Time: 07/31/17 11:23 AM   Result Value Ref Range    Tacrolimus, Trough 11.1 <=20.0 ng/mL   Comprehensive Metabolic Panel    Collection Time: 07/31/17 11:23 AM   Result Value Ref Range    Sodium 141 135 - 145 mmol/L    Potassium 4.6 3.5 - 5.0 mmol/L    Chloride 108 (H) 98 - 107 mmol/L    CO2 27.0 22.0 - 30.0 mmol/L    BUN 20 7 - 21 mg/dL    Creatinine 1.61 0.96 - 1.00 mg/dL    BUN/Creatinine Ratio 24     EGFR MDRD Non Af Amer >=60 >=60 mL/min/1.2m2    EGFR MDRD Af Amer >=60 >=60 mL/min/1.4m2    Anion Gap 6 (L) 9 - 15 mmol/L    Glucose 93 65 - 179 mg/dL    Calcium 04.5 8.5 - 40.9 mg/dL    Albumin 4.5 3.5 - 5.0 g/dL    Total Protein 7.3 6.5 - 8.3 g/dL    Total Bilirubin 0.4 0.0 - 1.2 mg/dL    AST 37 14 - 38 U/L    ALT 61 (H) 15 - 48 U/L    Alkaline Phosphatase 95 38 - 126 U/L   CBC w/ Differential    Collection Time: 07/31/17 11:23 AM   Result Value Ref Range    WBC 3.5 (L) 4.5 - 11.0 10*9/L    RBC 4.09 4.00 - 5.20 10*12/L    HGB 13.0 12.0 - 16.0 g/dL    HCT 81.1 91.4 - 78.2 %    MCV 93.7 80.0 - 100.0 fL    MCH 31.8 26.0 - 34.0 pg    MCHC 34.0 31.0 - 37.0 g/dL    RDW 95.6 21.3 - 08.6 %    MPV 8.2 7.0 - 10.0  fL    Platelet 256 150 - 440 10*9/L    Absolute Neutrophils 2.3 2.0 - 7.5 10*9/L    Absolute Lymphocytes 0.6 (L) 1.5 - 5.0 10*9/L    Absolute Monocytes 0.4 0.2 - 0.8 10*9/L    Absolute Eosinophils 0.1 0.0 - 0.4 10*9/L    Absolute Basophils 0.0 0.0 - 0.1 10*9/L    Large Unstained Cells 3 0 - 4 %   Urinalysis    Collection Time: 07/31/17 11:32 AM   Result Value Ref Range    Color, UA Yellow     Clarity, UA Clear     Specific Gravity, UA 1.019 1.003 - 1.030    pH, UA 6.0 5.0 - 9.0    Leukocyte Esterase, UA Trace (A) Negative    Nitrite, UA Negative Negative    Protein, UA Negative Negative    Glucose, UA Negative Negative    Ketones, UA Negative Negative    Urobilinogen, UA 0.2 mg/dL 0.2 mg/dL, 1.0 mg/dL    Bilirubin, UA Negative Negative    Blood, UA Negative Negative    RBC, UA 1 <4 /HPF    WBC, UA 1 0 - 5 /HPF    Squam Epithel, UA <1 0 - 5 /HPF    Bacteria, UA None Seen None Seen /HPF    Mucus, UA Rare (A) None Seen /HPF       Assessment and Plan  Gina Hart is a 42 y.o. female who is a recipient of a deceased donor transplant on 2017-02-05. Active medical issues include:    1. Status post deceased donor kidney transplant on 02/05/17 with no history of rejection or evidence of FSGS recurrence. Renal function is stable with serum creatinine today of 0.84 mg/dL within the baseline range of <1.0 mg/dL. UP/C has been normal. Will check DSA screen today.      2. Immunosuppression. Currently on tacrolimus 3 mg BID and myfortic 180 BID. Due to recent memory loss and confusion, will switch from tacrolimus to  cyclosporine 125 mg BID with a goal of 125-175 mg/dL and will start Prednisone 5 mg daily.    3. Hypertension. On amlodipine 10mg  daily and metoprolol tartrate 100mg  bid. BP is 92/62 today and she is asymptomatic. Will reduce amlodipine to 5 mg daily.     4. Headaches, improved. No further imaging of the brain is planned.    5. Lower back, neck, and LE pain. She visited the pain clinic and was diagnosed with spinal stenosis. She is following with the Pain Clinic for management.     6. Health Maintenance. Will plan for pneumococcal and shingrix vaccines at 1 year post-transplant    7. Follow up. 2 months. Continue weekly labs.     Scribe's Attestation: Jackey Loge, MD obtained and performed the history, physical exam and medical decision making elements that were entered into the chart.  Signed by [place] Ormond Foster, Scribe, on July 31, 2017 12:21 PM.    ----------------------------------------------------------------------------------------------------------------------  July 31, 2017 5:40 PM. Documentation assistance provided by the Scribe. I was present during the time the encounter was recorded. The information recorded by the Scribe was done at my direction and has been reviewed and validated by me.  ----------------------------------------------------------------------------------------------------------------------

## 2017-07-31 ENCOUNTER — Encounter: Admit: 2017-07-31 | Discharge: 2017-08-01 | Payer: MEDICARE

## 2017-07-31 ENCOUNTER — Encounter
Admit: 2017-07-31 | Discharge: 2017-08-01 | Payer: MEDICARE | Attending: Pharmacist Clinician (PhC)/ Clinical Pharmacy Specialist | Primary: Pharmacist Clinician (PhC)/ Clinical Pharmacy Specialist

## 2017-07-31 ENCOUNTER — Encounter: Admit: 2017-07-31 | Discharge: 2017-08-01 | Payer: MEDICARE | Attending: Nephrology | Primary: Nephrology

## 2017-07-31 DIAGNOSIS — Z48298 Encounter for aftercare following other organ transplant: Secondary | ICD-10-CM

## 2017-07-31 DIAGNOSIS — Z940 Kidney transplant status: Principal | ICD-10-CM

## 2017-07-31 DIAGNOSIS — D899 Disorder involving the immune mechanism, unspecified: Secondary | ICD-10-CM

## 2017-07-31 LAB — PROTEIN / CREATININE RATIO, URINE: PROTEIN URINE: 5.4 mg/dL

## 2017-07-31 LAB — CBC W/ AUTO DIFF
BASOPHILS ABSOLUTE COUNT: 0 10*9/L (ref 0.0–0.1)
EOSINOPHILS ABSOLUTE COUNT: 0.1 10*9/L (ref 0.0–0.4)
HEMATOCRIT: 38.3 % (ref 36.0–46.0)
HEMOGLOBIN: 13 g/dL (ref 12.0–16.0)
LARGE UNSTAINED CELLS: 3 % (ref 0–4)
LYMPHOCYTES ABSOLUTE COUNT: 0.6 10*9/L — ABNORMAL LOW (ref 1.5–5.0)
MEAN CORPUSCULAR HEMOGLOBIN CONC: 34 g/dL (ref 31.0–37.0)
MEAN CORPUSCULAR HEMOGLOBIN: 31.8 pg (ref 26.0–34.0)
MEAN PLATELET VOLUME: 8.2 fL (ref 7.0–10.0)
NEUTROPHILS ABSOLUTE COUNT: 2.3 10*9/L (ref 2.0–7.5)
PLATELET COUNT: 256 10*9/L (ref 150–440)
RED BLOOD CELL COUNT: 4.09 10*12/L (ref 4.00–5.20)
RED CELL DISTRIBUTION WIDTH: 14.2 % (ref 12.0–15.0)
WBC ADJUSTED: 3.5 10*9/L — ABNORMAL LOW (ref 4.5–11.0)

## 2017-07-31 LAB — COMPREHENSIVE METABOLIC PANEL
ALKALINE PHOSPHATASE: 95 U/L (ref 38–126)
ALT (SGPT): 61 U/L — ABNORMAL HIGH (ref 15–48)
ANION GAP: 6 mmol/L — ABNORMAL LOW (ref 9–15)
AST (SGOT): 37 U/L (ref 14–38)
BILIRUBIN TOTAL: 0.4 mg/dL (ref 0.0–1.2)
BLOOD UREA NITROGEN: 20 mg/dL (ref 7–21)
BUN / CREAT RATIO: 24
CALCIUM: 10 mg/dL (ref 8.5–10.2)
CHLORIDE: 108 mmol/L — ABNORMAL HIGH (ref 98–107)
CO2: 27 mmol/L (ref 22.0–30.0)
CREATININE: 0.84 mg/dL (ref 0.60–1.00)
EGFR MDRD AF AMER: >=60 mL/min/{1.73_m2} (ref >=60)
EGFR MDRD NON AF AMER: >=60 mL/min/{1.73_m2} (ref >=60)
GLUCOSE RANDOM: 93 mg/dL (ref 65–179)
POTASSIUM: 4.6 mmol/L (ref 3.5–5.0)
SODIUM: 141 mmol/L (ref 135–145)

## 2017-07-31 LAB — URINALYSIS
BACTERIA: NONE SEEN /HPF
BILIRUBIN UA: NEGATIVE
BLOOD UA: NEGATIVE
GLUCOSE UA: NEGATIVE
KETONES UA: NEGATIVE
NITRITE UA: NEGATIVE
PH UA: 6 (ref 5.0–9.0)
PROTEIN UA: NEGATIVE
RBC UA: 1 /HPF (ref <4)
SQUAMOUS EPITHELIAL: <1 /HPF (ref 0–5)
UROBILINOGEN UA: 0.2
WBC UA: 1 /HPF (ref 0–5)

## 2017-07-31 LAB — TACROLIMUS, TROUGH: Lab: 11.1

## 2017-07-31 LAB — ALKALINE PHOSPHATASE: Alkaline phosphatase:CCnc:Pt:Ser/Plas:Qn:: 95

## 2017-07-31 LAB — PROTEIN/CREAT RATIO, URINE: Protein/Creatinine:MRto:Pt:Urine:Qn:: 0.054

## 2017-07-31 LAB — MAGNESIUM: Magnesium:MCnc:Pt:Ser/Plas:Qn:: 1.5 — ABNORMAL LOW

## 2017-07-31 LAB — PHOSPHORUS: Phosphate:MCnc:Pt:Ser/Plas:Qn:: 4.1

## 2017-07-31 LAB — NEUTROPHILS ABSOLUTE COUNT: Lab: 2.3

## 2017-07-31 LAB — UROBILINOGEN UA: Lab: 0.2

## 2017-07-31 MED ORDER — PROGRAF 1 MG CAPSULE
ORAL_CAPSULE | Freq: Two times a day (BID) | ORAL | 11 refills | 0.00000 days
Start: 2017-07-31 — End: 2017-10-02

## 2017-07-31 NOTE — Unmapped (Signed)
A urine specimen was collected at the visit.

## 2017-08-01 LAB — CBC W/ DIFFERENTIAL
BASOPHILS ABSOLUTE COUNT: 0 10*9/L — ABNORMAL LOW
BASOPHILS RELATIVE PERCENT: 0.7 %
EOSINOPHILS ABSOLUTE COUNT: 0.1 10*9/L
EOSINOPHILS RELATIVE PERCENT: 2.9 %
HEMATOCRIT: 40.3 %
HEMOGLOBIN: 13.7 g/dL
LYMPHOCYTES ABSOLUTE COUNT: 0.5 10*9/L — ABNORMAL LOW
MEAN CORPUSCULAR HEMOGLOBIN CONC: 33.9 g/dL
MEAN CORPUSCULAR HEMOGLOBIN: 31.1 pg
MEAN CORPUSCULAR VOLUME: 91.7 fL
MEAN PLATELET VOLUME: 8.1 fL
MONOCYTES ABSOLUTE COUNT: 0.5 10*9/L
MONOCYTES RELATIVE PERCENT: 14.7 % — ABNORMAL HIGH
NEUTROPHILS RELATIVE PERCENT: 67.1 %
PLATELET COUNT: 223 10*9/L
RED BLOOD CELL COUNT: 4.4 10*12/L
RED CELL DISTRIBUTION WIDTH: 13.9 %
WHITE BLOOD CELL COUNT: 3.3 10*9/L — ABNORMAL LOW

## 2017-08-01 LAB — PHOSPHORUS: Lab: 3.9

## 2017-08-01 LAB — EOSINOPHILS RELATIVE PERCENT: Lab: 2.9

## 2017-08-01 LAB — BASIC METABOLIC PANEL
BLOOD UREA NITROGEN: 19 mg/dL — ABNORMAL HIGH
CALCIUM: 9.7 mg/dL
CO2: 28.2 mmol/L
CREATININE: 0.8 mg/dL
EGFR MDRD AF AMER: 95 mL/min/{1.73_m2}
GLUCOSE RANDOM: 94 mg/dL
POTASSIUM: 4.6 mmol/L
SODIUM: 139 mmol/L

## 2017-08-01 LAB — ESTIMATED AVERAGE GLUCOSE: Estimated average glucose:MCnc:Pt:Bld:Qn:Estimated from glycated hemoglobin: 91

## 2017-08-01 LAB — CMV QUANT: Lab: 0

## 2017-08-01 LAB — MAGNESIUM: Lab: 1.6 — ABNORMAL LOW

## 2017-08-01 LAB — CMV DNA, QUANTITATIVE, PCR

## 2017-08-01 LAB — HEMOGLOBIN A1C: HEMOGLOBIN A1C: 4.8 % (ref 4.8–5.6)

## 2017-08-01 LAB — POTASSIUM: Lab: 4.6

## 2017-08-01 MED FILL — MYFORTIC/180MG/TAB: MYFORTIC/180MG/TAB | 30 days supply | Qty: 60 | Fill #0

## 2017-08-01 MED FILL — PROGRAF/1MG/CAP: PROGRAF/1MG/CAP | 30 days supply | Qty: 180 | Fill #0

## 2017-08-02 ENCOUNTER — Ambulatory Visit
Admission: RE | Admit: 2017-08-02 | Discharge: 2017-08-02 | Disposition: A | Discharge status: Home / Self Care | Payer: Medicare Other | Source: Ambulatory Visit | Attending: Nurse Practitioner | Admitting: Nurse Practitioner

## 2017-08-02 ENCOUNTER — Telehealth: Payer: Self-pay | Admitting: *Deleted

## 2017-08-02 DIAGNOSIS — M5442 Lumbago with sciatica, left side: Principal | ICD-10-CM

## 2017-08-02 DIAGNOSIS — M47897 Other spondylosis, lumbosacral region: Secondary | ICD-10-CM | POA: Insufficient documentation

## 2017-08-02 DIAGNOSIS — G8929 Other chronic pain: Secondary | ICD-10-CM | POA: Diagnosis not present

## 2017-08-02 DIAGNOSIS — M5441 Lumbago with sciatica, right side: Principal | ICD-10-CM

## 2017-08-02 DIAGNOSIS — M4185 Other forms of scoliosis, thoracolumbar region: Secondary | ICD-10-CM | POA: Insufficient documentation

## 2017-08-03 LAB — HLA DS POST TRANSPLANT
ANTI-DONOR DRW #1 MFI: 426 MFI
ANTI-DONOR HLA-A #1 MFI: 34 MFI
ANTI-DONOR HLA-A #2 MFI: 16 MFI
ANTI-DONOR HLA-B #1 MFI: 0 MFI
ANTI-DONOR HLA-C #2 MFI: 5 MFI
ANTI-DONOR HLA-DQB #1 MFI: 154 MFI
ANTI-DONOR HLA-DQB #2 MFI: 2 MFI
ANTI-DONOR HLA-DR #1 MFI: 324 MFI
ANTI-DONOR HLA-DR #2 MFI: 432 MFI
DONOR DRW ANTIGEN #1: 53
DONOR HLA-A ANTIGEN #1: 2
DONOR HLA-A ANTIGEN #2: 11
DONOR HLA-B ANTIGEN #1: 61
DONOR HLA-B ANTIGEN #2: 44
DONOR HLA-C ANTIGEN #1: 2
DONOR HLA-C ANTIGEN #2: 5
DONOR HLA-DQB ANTIGEN #1: 2
DONOR HLA-DQB ANTIGEN #2: 7
DONOR HLA-DR ANTIGEN #1: 4
DONOR HLA-DR ANTIGEN #2: 7

## 2017-08-03 LAB — TACROLIMUS, TROUGH: Lab: 7.2

## 2017-08-03 LAB — DONOR HLA-C ANTIGEN #1: Lab: 2

## 2017-08-03 MED ORDER — PREDNISONE 5 MG TABLET
ORAL_TABLET | Freq: Every day | ORAL | 0 refills | 0.00000 days | Status: CP
Start: 2017-08-03 — End: 2017-08-27

## 2017-08-03 MED ORDER — AMLODIPINE 5 MG TABLET
ORAL_TABLET | Freq: Every day | ORAL | 11 refills | 0.00000 days
Start: 2017-08-03 — End: 2017-09-05

## 2017-08-03 MED ORDER — CYCLOSPORINE MODIFIED 25 MG CAPSULE
ORAL_CAPSULE | Freq: Two times a day (BID) | ORAL | 11 refills | 0.00000 days | Status: CP
Start: 2017-08-03 — End: 2017-08-16

## 2017-08-03 NOTE — Unmapped (Signed)
Reviewed medications with Dr. Nedra Hai.  Changing from prograf to CYA- 100 mg BID and prednisone 5 mg daily.,  Sent to local pharmacy by Dr. Nedra Hai.  Patient states understanding of instructions; when she gets the CYA and pres the prednisone will  Be 5 mg daily and the CYA will be (4) 25 mg caps Am and PM. She will leave me a messgae when she begins new medications.  She understands we will need labs twice a week. New orders to [employer]

## 2017-08-03 NOTE — Unmapped (Signed)
[facility] TRANSPL[facility]IN[facility] PHARMACY NOTE  07/31/2017  Gina Hart  [id number]    Medication changes today:   1.Decrease prograf to 2 mg bid (decreased today and will repeat labs in the morning since today was a peak)  2. Convert csa to 100 mg bid  3. Start prednisone 5 mg daily  3. Decrease amlodipine to 5 mg daily    Education/Adherence tools provided today:  1.provided updated medication list  2. provided additional pill box education  3.  provided additional education on immunosuppression and transplant related medications including reviewing indications of medications, dosing and side effects    Follow up items:  1. goal of understanding indications and dosing of immunosuppression medications  2. F/u with confusion and MS changes on cyclosporine  3. F/u with change in BP   4. Vitamin D level    Next visit with pharmacy in 1-2 weeks  ____________________________________________________________________    Gina Hart is a 42 y.o. 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    Allergies   Allergen Reactions   ??? Ibuprofen Swelling     Other reaction(s): SWELLING/EDEMA     All medications reviewed and updated.     Medication list includes revisions made during today???s encounter    Outpatient Encounter Prescriptions as of 07/31/2017   Medication Sig Dispense Refill   ??? acetaminophen (TYLENOL) 325 MG tablet Take 1-2 tablets (325-650 mg total) by mouth every four (4) hours as needed for pain. 100 tablet 0   ??? amLODIPine (NORVASC) 5 MG tablet Take 2 tablets (10 mg total) by mouth daily. 60 tablet 11   ??? aspirin (ECOTRIN) 81 MG tablet Take 1 tablet (81 mg total) by mouth daily. 30 tablet 0   ??? cyclobenzaprine (FLEXERIL) 10 MG tablet Take 1 tablet (10 mg total) by mouth two (2) times a day as needed for muscle spasms. 45 tablet 0   ??? cycloSPORINE modified (NEORAL) 25 MG capsule Take 4 capsules (100 mg total) by mouth Two (2) times a day. z94.0 240 capsule 11   ??? gabapentin (NEURONTIN) 100 MG capsule Take 2 capsules (200 mg total) by mouth Three (3) times a day. 180 capsule 11   ??? loratadine (CLARITIN) 10 mg tablet Take 10 mg by mouth daily as needed for allergies.     ??? metoprolol tartrate (LOPRESSOR) 100 MG tablet Take 1 tablet (100 mg total) by mouth Two (2) times a day. 180 tablet 3   ??? MYFORTIC 180 mg EC tablet Take 1 tablet (180 mg total) by mouth Two (2) times a day. 60 tablet 11   ??? PROGRAF 1 mg capsule Take 2 capsules (2 mg total) by mouth every twelve (12) hours. 120 capsule 11   ??? ranitidine (ZANTAC) 150 MG capsule Take 1 capsule (150 mg total) by mouth Two (2) times a day. 180 capsule 3   ??? sertraline (ZOLOFT) 100 MG tablet Take 1 tablet (100 mg total) by mouth daily. 90 tablet 3   ??? sulfamethoxazole-trimethoprim (BACTRIM,SEPTRA) 400-80 mg per tablet Take 1 tablet (80 mg of trimethoprim total) by mouth Every Monday, Wednesday, and Friday. 12 tablet 5   ??? traMADol (ULTRAM) 50 mg tablet Take 50  mg by mouth every six (6) hours as needed for pain.     ??? traZODone (DESYREL) 50 MG tablet Take 1 tablet (50 mg total) by mouth nightly. 90 tablet 1   ??? valGANciclovir (VALCYTE) 450 mg tablet Take 2 tablets (900 mg total) by mouth daily. 60 tablet 5   ??? [DISCONTINUED] PROGRAF 1 mg capsule Take 3 capsules (3 mg total) by mouth every twelve (12) hours. 180 capsule 11     No facility-administered encounter medications on file as of 07/31/2017.      Induction agent : alemtuzumab    CURRENT IMMUNOSUPPRESSION: prograf 3 mg bid mg PO BID  prograf/cyclosporine goal: 6-8   myfortic 180 mg PO BID (decreased on 10/25 d/t leukopenia)    Patient is tolerating immunosuppression well    IMMUNOSUPPRESSION DRUG LEVELS:  Lab Results   Component Value Date    TACROLIMUS 7.2 08/01/2017    TACROLIMUS 11.1 07/31/2017    TACROLIMUS 3.0 07/24/2017     Tac level is peak today on 2/5    Graft function: improving  DSA:   Biopsies to date:   WBC/ANC:  wnl    Plan: Decrease tac to 2 mg bid and repeat labs tomorrow. Will initiate cyclosporine 100 mg bid and see if this helps with confusion and memory loss  Start prednisone 5 mg daily during transition to cyclosporine     ID prophylaxis:   CMV Status: D+/ R-, high risk. CMV prophylaxis: valganciclovir 900 mg daily x 6 months per protocol.  No results found for: CMVCP  PCP Prophylaxis: bactrim SS 1 tab MWF x 6 months.  Thrush: completed  Patient is  tolerating infectious prophylaxis well    Plan: completed    CV Prophylaxis: asa 81 mg   The ASCVD Risk score Denman George DC Montez Hageman, et al., 2013) failed to calculate.  Statin therapy: Not indicated     BP: Goal < 140/90. Clinic vitals reported above  Home BP ranges: did not bring today but reports some lows at home SBP 90-110s and some dizziness. Today 92/62  Current meds include: amlodipine 10 mg daily  Plan: decrease amlodipine 5 mg daily. Monitor BP.     Anemia:  H/H:   Lab Results   Component Value Date    HGB 13.7 08/01/2017     Lab Results   Component Value Date    HCT 40.3 08/01/2017     Iron panel:  Lab Results   Component Value Date    IRON 21 (L) 01/12/2017    TIBC 238.9 (L) 01/12/2017    FERRITIN 1,200.0 (H) 01/12/2017     Lab Results   Component Value Date    LABIRON 9 (L) 01/12/2017   Prior ESA use:  ntd  Plan: Continue to monitor.     DM:   Lab Results   Component Value Date    A1C 4.8 07/31/2017   . Goal A1c < 7  History of Dm? No  Established with endocrinologist/PCP for BG managment? No  Plan: continue to monitor    Electrolytes: wnl  Meds currently on: none  Plan: continue to monitor    BM: wnl 2 per day  Meds currently on: not taking prn meds  Plan: none    Pain: minimal- mostly complains of back pain and using oxycodone for that because she is out of flexeril   Meds currently on: flexeril prn Plan: back pain that she sees outside provider for    Bone health:   Vitamin D  Level: none. Goal > 30.   Last DEXA results:    Current meds include: none  Plan: recommend vitamin d monitoring at future appt. Continue to monitor.     Women's/Men's Health:  Gina Hart is a 42 y.o. Female perimenopausal. Patient reports no men's/women's health issues  Plan: continue to monitor    Adherence: Patient has average understanding of medications; was able to independently identify names/doses of immunosuppressants and OI meds.  Patient  does fill their own pill box on a regular basis at home.  Patient brought medication card:no  Pill box:did not bring  Patient requested refills for the following meds: flexeril  Corrections needed in Epic medication list: 2   Plan:  provided moderate adherence counseling/intervention    Other: currently on zantac 150 mg bid prn- reports using it and it helps with heart burn.     Spent approximately 40 minutes on educating this patient and greater than 50% was spent in direct face to face counseling regarding post transplant medication education. Questions and concerns were address to patient's satisfaction.    Patient was reviewed with Dr. Margaretmary Bayley who was agreement with the stated plan:     During this visit, the following was completed:   BG log data assessment  BP log data assessment  Labs ordered and evaluated  complex treatment plan >1 DS   Patient education was completed for 25-60 minutes     All questions/concerns were addressed to the patient's satisfaction.  __________________________________________  Noah Charon, PHARMD, CPP  [facility]  PAGER [phone]

## 2017-08-06 LAB — FSAB CLASS 1 ANTIBODY SPECIFICITY

## 2017-08-06 LAB — HLA CL2 AB RESULT: Lab: NEGATIVE

## 2017-08-06 LAB — HLA CLASS 1 ANTIBODY

## 2017-08-06 LAB — FSAB CLASS 2 ANTIBODY SPECIFICITY

## 2017-08-06 NOTE — Progress Notes (Signed)
Results were reviewed and found to be: abnormal  No acute injury or pathology identified  Review would suggest interventional pain management techniques may be of benefit

## 2017-08-06 NOTE — Unmapped (Signed)
Patient started CYA  And pred on Saturday.  Will get ;labs at [facility] on Wednesday.  Orders updated

## 2017-08-10 ENCOUNTER — Other Ambulatory Visit: Payer: Self-pay

## 2017-08-10 LAB — CBC W/ DIFFERENTIAL
BASOPHILS ABSOLUTE COUNT: 0 10*9/L — ABNORMAL LOW
BASOPHILS RELATIVE PERCENT: 0.5 %
EOSINOPHILS ABSOLUTE COUNT: 0.1 10*9/L
EOSINOPHILS RELATIVE PERCENT: 2.1 %
HEMATOCRIT: 39.4 %
HEMOGLOBIN: 13.1 g/dL
LYMPHOCYTES ABSOLUTE COUNT: 0.7 10*9/L — ABNORMAL LOW
LYMPHOCYTES RELATIVE PERCENT: 16.7 % — ABNORMAL LOW
MEAN CORPUSCULAR HEMOGLOBIN CONC: 33.4 g/dL
MEAN CORPUSCULAR HEMOGLOBIN: 30.9 pg
MEAN CORPUSCULAR VOLUME: 92.4 fL
MEAN PLATELET VOLUME: 7.9 fL
MONOCYTES ABSOLUTE COUNT: 0.5 10*9/L
MONOCYTES RELATIVE PERCENT: 13.9 % — ABNORMAL HIGH
PLATELET COUNT: 228 10*9/L
RED BLOOD CELL COUNT: 4.3 10*12/L
RED CELL DISTRIBUTION WIDTH: 14 %
WHITE BLOOD CELL COUNT: 3.9 10*9/L — ABNORMAL LOW

## 2017-08-10 LAB — NEUTROPHILS ABSOLUTE COUNT: Lab: 2.6

## 2017-08-10 LAB — MAGNESIUM: Lab: 1.8

## 2017-08-10 LAB — PHOSPHORUS: Lab: 4.3

## 2017-08-10 LAB — BASIC METABOLIC PANEL
BLOOD UREA NITROGEN: 20 mg/dL — ABNORMAL HIGH
CALCIUM: 9.4 mg/dL
CO2: 25.3 mmol/L
CREATININE: 0.8 mg/dL
EGFR MDRD AF AMER: 95 mL/min/{1.73_m2}
GLUCOSE RANDOM: 98 mg/dL
POTASSIUM: 4.4 mmol/L
SODIUM: 138 mmol/L

## 2017-08-10 LAB — CHLORIDE: Lab: 103

## 2017-08-13 LAB — CYCLOSPORINE, TROUGH: Lab: 78 — ABNORMAL LOW

## 2017-08-16 MED ORDER — CYCLOSPORINE MODIFIED 25 MG CAPSULE: each | 11 refills | 0 days

## 2017-08-16 MED ORDER — CYCLOSPORINE MODIFIED 25 MG CAPSULE: 125 mg | capsule | Freq: Two times a day (BID) | 11 refills | 0 days | Status: AC

## 2017-08-16 MED ORDER — CYCLOSPORINE MODIFIED 25 MG CAPSULE
Freq: Two times a day (BID) | ORAL | 11 refills | 0.00000 days | Status: CP
Start: 2017-08-16 — End: 2017-08-16

## 2017-08-16 NOTE — Unmapped (Signed)
Reviewed CYA with Dr. Nedra Hai.  Patient understands to increase CYA to 125 mg Am and PM.  She states her headaches and foggy thinking has improved since she switched to the CYA.

## 2017-08-17 LAB — PHOSPHORUS: Lab: 3.9

## 2017-08-17 LAB — CBC W/ DIFFERENTIAL
BASOPHILS RELATIVE PERCENT: 0.5 %
EOSINOPHILS ABSOLUTE COUNT: 0.1 10*9/L
EOSINOPHILS RELATIVE PERCENT: 2.2 %
HEMATOCRIT: 38.7 %
HEMOGLOBIN: 12.9 g/dL
LYMPHOCYTES ABSOLUTE COUNT: 0.7 10*9/L — ABNORMAL LOW
LYMPHOCYTES RELATIVE PERCENT: 18.8 % — ABNORMAL LOW
MEAN CORPUSCULAR HEMOGLOBIN CONC: 33.3 g/dL
MEAN CORPUSCULAR HEMOGLOBIN: 31 pg
MEAN CORPUSCULAR VOLUME: 93 fL
MONOCYTES ABSOLUTE COUNT: 0.5 10*9/L
MONOCYTES RELATIVE PERCENT: 13.6 % — ABNORMAL HIGH
NEUTROPHILS ABSOLUTE COUNT: 2.6 10*9/L
NEUTROPHILS RELATIVE PERCENT: 64.9 %
PLATELET COUNT: 211 10*9/L
RED BLOOD CELL COUNT: 4.2 10*12/L
RED CELL DISTRIBUTION WIDTH: 13.8 %

## 2017-08-17 LAB — BASIC METABOLIC PANEL
CALCIUM: 9.5 mg/dL
CHLORIDE: 106 mmol/L
CO2: 25.1 mmol/L
CREATININE: 0.8 mg/dL
EGFR MDRD AF AMER: 95 mL/min/{1.73_m2}
GLUCOSE RANDOM: 100 mg/dL — ABNORMAL HIGH
SODIUM: 139 mmol/L

## 2017-08-17 LAB — MONOCYTES RELATIVE PERCENT: Lab: 13.6 — ABNORMAL HIGH

## 2017-08-17 LAB — MAGNESIUM: Lab: 1.8

## 2017-08-17 LAB — CHLORIDE: Lab: 106

## 2017-08-17 NOTE — Unmapped (Signed)
[facility] Specialty Medication Referral: No PA required    Medication (Brand/Generic): CYCLOSPORINE MOD 25MG  CAP    Initial FSI Test Claim completed with resulted information below:  No PA required  Patient ABLE to fill at [facility]  Insurance Company:  ALL  Anticipated Copay: $0    As Co-pay is under $100 defined limit, per policy there will be no further investigation of need for financial assistance at this time unless patient requests. This referral has been communicated to the provider and handed off to the [facility] team for further processing and filling of prescribed medication.   ______________________________________________________________________  Please utilize this referral for viewing purposes as it will serve as the central location for all relevant documentation and updates.

## 2017-08-20 LAB — COMPREHENSIVE METABOLIC PANEL
BLOOD UREA NITROGEN: 23 mg/dL — ABNORMAL HIGH
BLOOD UREA NITROGEN: 23 mg/dL — ABNORMAL HIGH
CALCIUM: 9.8 mg/dL
CALCIUM: 9.8 mg/dL
CHLORIDE: 103 mmol/L
CHLORIDE: 103 mmol/L
CO2: 26.6 mmol/L
CO2: 26.6 mmol/L
CREATININE: 0.8 mg/dL
CREATININE: 0.8 mg/dL
EGFR MDRD AF AMER: 95 mL/min/{1.73_m2}
GLUCOSE RANDOM: 93 mg/dL
GLUCOSE RANDOM: 93 mg/dL
POTASSIUM: 4.3 mmol/L
POTASSIUM: 4.3 mmol/L
SODIUM: 138 mmol/L
SODIUM: 138 mmol/L

## 2017-08-20 LAB — CBC W/ DIFFERENTIAL
BASOPHILS ABSOLUTE COUNT: 0 10*9/L
BASOPHILS ABSOLUTE COUNT: 0 10*9/L — ABNORMAL LOW
BASOPHILS RELATIVE PERCENT: 0.4 %
EOSINOPHILS ABSOLUTE COUNT: 0.1 10*9/L
EOSINOPHILS ABSOLUTE COUNT: 0.1 10*9/L
EOSINOPHILS RELATIVE PERCENT: 2.6 %
HEMATOCRIT: 39.8 %
HEMOGLOBIN: 13.5 g/dL
HEMOGLOBIN: 13.5 g/dL
LYMPHOCYTES ABSOLUTE COUNT: 0.8 10*9/L
LYMPHOCYTES ABSOLUTE COUNT: 0.8 10*9/L — ABNORMAL LOW
LYMPHOCYTES RELATIVE PERCENT: 17.7 % — ABNORMAL LOW
MEAN CORPUSCULAR HEMOGLOBIN CONC: 33.4 g/dL
MEAN CORPUSCULAR HEMOGLOBIN: 31.5 pg
MEAN CORPUSCULAR VOLUME: 92.7 fL
MEAN PLATELET VOLUME: 7.7 fL
MONOCYTES ABSOLUTE COUNT: 0.5 10*9/L
MONOCYTES ABSOLUTE COUNT: 0.5 10*9/L
NEUTROPHILS ABSOLUTE COUNT: 3 10*9/L
NEUTROPHILS ABSOLUTE COUNT: 3 10*9/L
NEUTROPHILS RELATIVE PERCENT: 67.6 %
PLATELET COUNT: 203 10*9/L
PLATELET COUNT: 203 10*9/L
RED BLOOD CELL COUNT: 4.3 10*12/L
RED CELL DISTRIBUTION WIDTH: 13.7 %
WBC ADJUSTED: 4.4 10*9/L
WHITE BLOOD CELL COUNT: 4.4 10*9/L

## 2017-08-20 LAB — MAGNESIUM
Lab: 1.7
Lab: 1.7 — ABNORMAL LOW

## 2017-08-20 LAB — ALT (SGPT): Lab: 0

## 2017-08-20 LAB — ALKALINE PHOSPHATASE: Lab: 0

## 2017-08-20 LAB — EOSINOPHILS ABSOLUTE COUNT: Lab: 0.1

## 2017-08-20 LAB — PHOSPHORUS
Lab: 4.2
Lab: 4.2

## 2017-08-20 LAB — LYMPHOCYTES RELATIVE PERCENT: Lab: 17.7 — ABNORMAL LOW

## 2017-08-20 LAB — CYCLOSPORINE, TROUGH: Lab: 79 — ABNORMAL LOW

## 2017-08-20 NOTE — Unmapped (Addendum)
3/13: CPP states that patient should be on 5mg  amlodipine daily and that new rx for prednisone was sent over. I called and spoke with patient - she is aware of 1 tab daily on amlodipine and says got pred form outside pharmacy so doesn't need a refill at this time.    2/26 ADDENDUM: Patient states that needs refill on prednisone (wasn't filled with [place] before), and amlodipine (says dose inc to 10mg  daily) - requesting new rxs from txp coordinator/cpp today for shipment delivery 2/28.    [facility]   Patient Onboarding/Medication Counseling    Ms.Kjos is a 43 y.o. female with kidney transplant who I am counseling today on initiation of therapy.    Medication: Cyclosporine  Others: zantac (patient will call back about Myfortic)    Verified patient's date of birth / HIPAA.      Education Provided: ??    Dose/Administration discussed: 5 caps twice daily. This medication should be taken  without regard to food. (but important to be consistent)    Storage requirements: this medicine should be stored at room temperature.     Side effects discussed: Discussed common side effects, including headache, hypertension, potential tremor or GI upset. If patient experiences trends of higher BP, they need to call the doctor.  Patient will receive a Lexi-Comp drug information handout with shipment.    Handling precautions reviewed:  n/a.    Drug Interactions: other medications reviewed and up to date in Epic.  No drug interactions identified.    Comorbidities/Allergies: reviewed and up to date in Epic.    Verified therapy is appropriate and should continue      Delivery Information    Anticipated copay of $0 reviewed with patient. Verified delivery address in FSI and reviewed medication storage requirement.    Scheduled delivery date: Wed, Feb 27    Explained that we ship using UPS and this shipment will not require a signature.      Explained the services we provide at [facility] and that each month we would call to set up refills.  Stressed importance of returning phone calls so that we could ensure they receive their medications in time each month.  Informed patient that we should be setting up refills 7-10 days prior to when they will run out of medication.  Informed patient that welcome packet will be sent.      Patient verbalized understanding of the above information as well as how to contact the pharmacy at [phone] option 4 with any questions/concerns.        Patient Specific Needs      ? Patient has no physical or cognitive barriers.    ? Patient prefers to have medications discussed with  Patient     ? Patient is able to read and understand education materials at a high school level or above.        Lanney Gins  [facility] Specialty Pharmacist

## 2017-08-21 MED FILL — RANITIDINE HCL/150MG/TABS: RANITIDINE HCL/150MG/TABS | 90 days supply | Qty: 180 | Fill #1

## 2017-08-21 MED FILL — CYCLOSPORINE MODIFIED/25MG MOD/CAPS: CYCLOSPORINE MODIFIED/25MG MOD/CAPS | 30 days supply | Qty: 300 | Fill #0

## 2017-08-22 MED ORDER — RANITIDINE 150 MG TABLET
ORAL_TABLET | Freq: Two times a day (BID) | ORAL | 3 refills | 0.00000 days | Status: CP
Start: 2017-08-22 — End: 2017-08-22

## 2017-08-22 MED ORDER — RANITIDINE 150 MG TABLET: 150 mg | tablet | Freq: Two times a day (BID) | 3 refills | 0 days | Status: AC

## 2017-08-22 MED FILL — AMLODIPINE/5MG/TABS: AMLODIPINE/5MG/TABS | 30 days supply | Qty: 60 | Fill #5

## 2017-08-23 LAB — CYCLOSPORINE, TROUGH: Lab: 104

## 2017-08-24 LAB — CBC W/ DIFFERENTIAL
BASOPHILS ABSOLUTE COUNT: 0 10*9/L — ABNORMAL LOW
EOSINOPHILS ABSOLUTE COUNT: 0.1 10*9/L
HEMATOCRIT: 38.8 %
HEMOGLOBIN: 13 g/dL
LYMPHOCYTES ABSOLUTE COUNT: 0.7 10*9/L
MONOCYTES ABSOLUTE COUNT: 0.5 10*9/L
NEUTROPHILS ABSOLUTE COUNT: 2.5 10*9/L
PLATELET COUNT: 202 10*9/L
WBC ADJUSTED: 3.8 10*9/L — ABNORMAL LOW

## 2017-08-24 LAB — COMPREHENSIVE METABOLIC PANEL
BLOOD UREA NITROGEN: 28 mg/dL — ABNORMAL HIGH
CALCIUM: 9.4 mg/dL
CO2: 26.9 mmol/L
CREATININE: 0.8 mg/dL
POTASSIUM: 4 mmol/L
SODIUM: 138 mmol/L

## 2017-08-24 LAB — MAGNESIUM: Lab: 1.8

## 2017-08-24 LAB — NUCLEATED RED BLOOD CELLS: Lab: 0

## 2017-08-24 LAB — ALKALINE PHOSPHATASE: Lab: 0

## 2017-08-24 LAB — PHOSPHORUS: Lab: 3.8

## 2017-08-24 NOTE — Unmapped (Signed)
Reviewed CYA of 104 with Dr. Nedra Hai.  Will wait for next lab draw to adjust dose

## 2017-08-27 MED ORDER — PREDNISONE 5 MG TABLET
ORAL_TABLET | Freq: Every day | ORAL | 11 refills | 0 days | Status: CP
Start: 2017-08-27 — End: 2017-09-05

## 2017-08-27 MED FILL — MYFORTIC/180MG/TAB: MYFORTIC/180MG/TAB | 30 days supply | Qty: 60 | Fill #1

## 2017-08-27 NOTE — Unmapped (Signed)
Prednisone refilled and sent to [facility]

## 2017-08-28 LAB — CYCLOSPORINE, TROUGH: Lab: 104

## 2017-08-30 LAB — BASIC METABOLIC PANEL
BLOOD UREA NITROGEN: 29 mg/dL — ABNORMAL HIGH
CHLORIDE: 104 mmol/L
CO2: 25.9 mmol/L
EGFR MDRD AF AMER: 74 mL/min/{1.73_m2}
GLUCOSE RANDOM: 97 mg/dL
POTASSIUM: 4.3 mmol/L
SODIUM: 139 mmol/L

## 2017-08-30 LAB — CBC W/ DIFFERENTIAL
BASOPHILS ABSOLUTE COUNT: 0 10*9/L
EOSINOPHILS ABSOLUTE COUNT: 0.1 10*9/L
HEMATOCRIT: 39.1 %
HEMOGLOBIN: 13.2 g/dL
MONOCYTES ABSOLUTE COUNT: 0.4 10*9/L
NEUTROPHILS ABSOLUTE COUNT: 2.7 10*9/L
PLATELET COUNT: 208 10*9/L
WBC ADJUSTED: 3.9 10*9/L — ABNORMAL LOW

## 2017-08-30 LAB — GLUCOSE RANDOM: Lab: 97

## 2017-08-30 LAB — MEAN CORPUSCULAR VOLUME: Lab: 0

## 2017-08-30 LAB — MAGNESIUM: Lab: 1.8

## 2017-08-30 LAB — PHOSPHORUS: Lab: 3.8

## 2017-09-03 LAB — TACROLIMUS, TROUGH: Lab: 0

## 2017-09-03 NOTE — Unmapped (Signed)
Made return appointment for Dr. Margaretmary Bayley

## 2017-09-05 MED ORDER — PREDNISONE 5 MG TABLET: 5 mg | tablet | Freq: Every day | 11 refills | 0 days | Status: AC

## 2017-09-05 MED ORDER — AMLODIPINE 5 MG TABLET
ORAL_TABLET | Freq: Every day | ORAL | 11 refills | 0.00000 days | Status: CP
Start: 2017-09-05 — End: 2018-07-16

## 2017-09-05 MED ORDER — PREDNISONE 5 MG TABLET
ORAL_TABLET | Freq: Every day | ORAL | 11 refills | 0.00000 days | Status: CP
Start: 2017-09-05 — End: 2017-09-05

## 2017-09-05 MED ORDER — AMLODIPINE 5 MG TABLET: 5 mg | tablet | Freq: Every day | 11 refills | 0 days | Status: AC

## 2017-09-11 NOTE — Unmapped (Signed)
[facility] Refill and Clinical Coordination Note  Medication(s): cyclosporine modified 25mg     [name], DOB: Sep 10, 1975  Phone: [phone] (home) , Alternate phone contact: N/A  Shipping address: [address]  [place] [address]  Phone or address changes today?: No  All above HIPAA information verified.  Insurance changes? No    Completed refill and clinical call assessment today to schedule patient's medication shipment from the [facility] [phone]).      MEDICATION RECONCILIATION    Confirmed the medication and dosage are correct and have not changed: Yes, regimen is correct and unchanged.    Were there any changes to your medication(s) in the past month:  No, there are no changes reported at this time.    ADHERENCE    Is this medicine transplant or covered by Medicare Part B? Yes.    Cyclosporine Modified 25 mg   Quantity filled last month: 300   # of tablets left on hand: 12 days    Did you miss any doses in the past 4 weeks? No missed doses reported.  Adherence counseling provided? Not needed     SIDE EFFECT MANAGEMENT    Are you tolerating your medication?:  Karalee reports tolerating the medication.  Side effect management discussed: None      Therapy is appropriate and should be continued.    Evidence of clinical benefit: See Epic note from 07/31/17      FINANCIAL/SHIPPING    Delivery Scheduled: Yes, Expected medication delivery date: 09/18/17   Additional medications refilled: No additional medications/refills needed at this time.    The patient will receive an FSI print out for each medication shipped and additional FDA Medication Guides as required.  Patient education from [place] or Robet Leu may also be included in the shipment.    Idolina did not have any additional questions at this time.    Delivery address validated in FSI scheduling system: Yes, address listed above is correct.      We will follow up with patient monthly for standard refill processing and delivery.      Thank you,  Lupita Shutter   [facility] Specialty Pharmacist

## 2017-09-14 LAB — CBC W/ DIFFERENTIAL
BASOPHILS ABSOLUTE COUNT: 0 10*9/L
BASOPHILS RELATIVE PERCENT: 0.4 %
EOSINOPHILS ABSOLUTE COUNT: 0.1 10*9/L
EOSINOPHILS RELATIVE PERCENT: 2.2 %
HEMATOCRIT: 40.8 %
HEMOGLOBIN: 13.9 g/dL
LYMPHOCYTES ABSOLUTE COUNT: 0.9 10*9/L — ABNORMAL LOW
LYMPHOCYTES RELATIVE PERCENT: 19.4 % — ABNORMAL LOW
MEAN CORPUSCULAR HEMOGLOBIN CONC: 34 g/dL
MEAN CORPUSCULAR HEMOGLOBIN: 31.5 pg
MEAN PLATELET VOLUME: 8 fL
MONOCYTES ABSOLUTE COUNT: 0.4 10*9/L
MONOCYTES RELATIVE PERCENT: 9.6 %
NEUTROPHILS ABSOLUTE COUNT: 3 10*9/L
NEUTROPHILS RELATIVE PERCENT: 68.4 %
PLATELET COUNT: 216 10*9/L
RED BLOOD CELL COUNT: 4.4 10*12/L
RED CELL DISTRIBUTION WIDTH: 12.8 %
WBC ADJUSTED: 4.4 10*9/L

## 2017-09-14 LAB — COMPREHENSIVE METABOLIC PANEL
BLOOD UREA NITROGEN: 31 mg/dL — ABNORMAL HIGH
CALCIUM: 9.5 mg/dL
CHLORIDE: 103 mmol/L
CO2: 27.3 mmol/L
CREATININE: 1 mg/dL
GLUCOSE RANDOM: 89 mg/dL
POTASSIUM: 4.1 mmol/L
SODIUM: 139 mmol/L

## 2017-09-14 LAB — MAGNESIUM: Lab: 1.6 — ABNORMAL LOW

## 2017-09-14 LAB — WHITE BLOOD CELL COUNT: Lab: 4.4

## 2017-09-14 LAB — PHOSPHORUS: Lab: 3.9

## 2017-09-14 LAB — CHLORIDE: Lab: 103

## 2017-09-17 MED FILL — CYCLOSPORINE MODIFIED/25MG MOD/CAPS: CYCLOSPORINE MODIFIED/25MG MOD/CAPS | 30 days supply | Qty: 300 | Fill #1

## 2017-09-18 LAB — CBC W/ DIFFERENTIAL
BASOPHILS ABSOLUTE COUNT: 0 10*9/L
BASOPHILS RELATIVE PERCENT: 0.7 %
EOSINOPHILS ABSOLUTE COUNT: 0.1 10*9/L
HEMATOCRIT: 38.5 %
HEMOGLOBIN: 13.1 g/dL
LYMPHOCYTES ABSOLUTE COUNT: 0.7 10*9/L
LYMPHOCYTES RELATIVE PERCENT: 19.1 % — ABNORMAL LOW
MEAN CORPUSCULAR HEMOGLOBIN CONC: 34.1 g/dL
MEAN CORPUSCULAR HEMOGLOBIN: 31.6 pg
MEAN PLATELET VOLUME: 7.8 fL
MONOCYTES ABSOLUTE COUNT: 0.5 10*9/L
MONOCYTES RELATIVE PERCENT: 12.9 % — ABNORMAL HIGH
NEUTROPHILS ABSOLUTE COUNT: 2.3 10*9/L
PLATELET COUNT: 203 10*9/L
RED BLOOD CELL COUNT: 4.2 10*12/L
RED CELL DISTRIBUTION WIDTH: 12.9 %
WHITE BLOOD CELL COUNT: 3.6 10*9/L — ABNORMAL LOW

## 2017-09-18 LAB — MAGNESIUM: Lab: 1.7

## 2017-09-18 LAB — BASOPHILS ABSOLUTE COUNT: Lab: 0

## 2017-09-19 LAB — COMPREHENSIVE METABOLIC PANEL
BLOOD UREA NITROGEN: 19 mg/dL — ABNORMAL HIGH
CHLORIDE: 103 mmol/L
CO2: 24.6 mmol/L
CREATININE: 0.8 mg/dL
GLUCOSE RANDOM: 96 mg/dL
POTASSIUM: 4.1 mmol/L
SODIUM: 136 mmol/L

## 2017-09-19 LAB — EGFR MDRD AF AMER: Lab: 0

## 2017-09-19 LAB — PHOSPHORUS: Lab: 3.2

## 2017-09-19 NOTE — Unmapped (Signed)
Patient does not need a refill of specialty medication at this time. Moving specialty refill reminder call to appropriate date and removed call attempt data.  Spoke with patient & she states she just got her Cyclosporine yesterday (09/18/2017) and she still has 2 bottles of the Myfortic. So she does not need any refills at this time.

## 2017-09-19 NOTE — Unmapped (Signed)
Reviewed labs with patient. °

## 2017-09-20 LAB — CYCLOSPORINE, TROUGH: Lab: 180

## 2017-09-24 NOTE — Unmapped (Signed)
Pt request for RX Refill

## 2017-09-27 MED ORDER — TRAZODONE 50 MG TABLET
ORAL_TABLET | 11 refills | 0 days | Status: CP
Start: 2017-09-27 — End: 2019-01-01

## 2017-09-28 LAB — CBC W/ DIFFERENTIAL
BASOPHILS ABSOLUTE COUNT: 0 10*9/L — ABNORMAL LOW
BASOPHILS RELATIVE PERCENT: 0.5 %
EOSINOPHILS ABSOLUTE COUNT: 0.1 10*9/L
EOSINOPHILS RELATIVE PERCENT: 2 %
HEMATOCRIT: 39.6 %
HEMOGLOBIN: 13.4 g/dL
LYMPHOCYTES ABSOLUTE COUNT: 0.7 10*9/L — ABNORMAL LOW
LYMPHOCYTES RELATIVE PERCENT: 17.2 % — ABNORMAL LOW
MEAN CORPUSCULAR HEMOGLOBIN: 31.3 pg
MEAN CORPUSCULAR VOLUME: 92.6 fL
MEAN PLATELET VOLUME: 8.1 fL
MONOCYTES ABSOLUTE COUNT: 0.4 10*9/L
MONOCYTES RELATIVE PERCENT: 9.9 %
NEUTROPHILS ABSOLUTE COUNT: 2.8 10*9/L
PLATELET COUNT: 216 10*9/L
RED BLOOD CELL COUNT: 4.3 10*12/L
RED CELL DISTRIBUTION WIDTH: 12.8 %
WBC ADJUSTED: 4 10*9/L

## 2017-09-28 LAB — BASIC METABOLIC PANEL
CALCIUM: 9.6 mg/dL
CHLORIDE: 104 mmol/L
CO2: 26.8 mmol/L
CREATININE: 0.9 mg/dL
EGFR MDRD AF AMER: 83 mL/min/{1.73_m2}
POTASSIUM: 4.1 mmol/L
SODIUM: 138 mmol/L

## 2017-09-28 LAB — PHOSPHORUS: Lab: 4

## 2017-09-28 LAB — CHLORIDE: Lab: 104

## 2017-09-28 LAB — ANISOCYTOSIS: Lab: 0

## 2017-09-28 LAB — MAGNESIUM: Lab: 1.6 — ABNORMAL LOW

## 2017-10-01 LAB — CYCLOSPORINE, TROUGH: Lab: 148

## 2017-10-01 NOTE — Unmapped (Addendum)
Transplant Nephrology Clinic Visit    History of Present Illness  Gina Hart is a 42 y.o. female who is s/p deceased kidney transplant on Feb 09, 2017. She has experienced no episodes of rejection with normal urinary protein and baseline creatinine <1.0 mg/dL.     On 08/03/2017, due to tacrolimus neurotoxicity, she discontinued tacrolimus and start cyclosporine 100 mg BID. Her cyclosporine dose was increased to 125 mg daily on 08/16/2017. Cyclosporine levels have subsequently been 104-180. Since making the change she has noted improvements in her mental clarity, tremors, and headaches. She reports occasional headaches, but they are infrequent and resolve with two tylenol. She is being seen in the [place] pain clinic but has had little relief of radiculopathy pain with gabapentin or injections. She started smoking marijuana and feels this is the only thing that actually helps her pain. She is no longer taking Flexeril or tramadol. She continues to take gabapentin.    She was treated by her PCP for a UTI with Ciprofloxacin for 5 days starting 09/26/17 (finished 10/01/17). She denies current UTI symptoms, nausea, vomiting, or diarrhea.     Transplant History:  1. 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  2. S/P kidney transplant at [facility] [id number]. Deceased donor with KDPI 19%. CMV D+/R-; EBV D+/R+  3. Immunosuppression = campath induction followed by tacrolimus and myfortic maintenance therapy, steroid free regimen.   4. Tacrolimus changed to cyclosporine 07/31/2017 due to neurotoxicity with memory deficits and headaches as primary manifestation.     Past Medical History:  1. ESRD secondary to FSGS.   2. Hypertension  3. Scoliosis  S/P kidney transplant as stated above    Review of Systems    All other systems are reviewed and are negative.    Medications  Current Outpatient Medications   Medication Sig Dispense Refill   ??? acetaminophen (TYLENOL) 325 MG tablet Take 1-2 tablets (325-650 mg total) by mouth every four (4) hours as needed for pain. 100 tablet 0   ??? amLODIPine (NORVASC) 5 MG tablet Take 1 tablet (5 mg total) by mouth daily. 30 tablet 11   ??? aspirin (ECOTRIN) 81 MG tablet Take 1 tablet (81 mg total) by mouth daily. 30 tablet 0   ??? gabapentin (NEURONTIN) 100 MG capsule Take 2 capsules (200 mg total) by mouth Three (3) times a day. 180 capsule 11   ??? loratadine (CLARITIN) 10 mg tablet Take 10 mg by mouth daily as needed for allergies.     ??? metoprolol tartrate (LOPRESSOR) 100 MG tablet Take 1 tablet (100 mg total) by mouth Two (2) times a day. 180 tablet 3   ??? MYFORTIC 180 mg EC tablet Take 1 tablet (180 mg total) by mouth Two (2) times a day. 60 tablet 11   ??? predniSONE (DELTASONE) 5 MG tablet Take 1 tablet (5 mg total) by mouth daily. 30 tablet 11   ??? ranitidine (ZANTAC) 150 MG tablet Take 1 tablet (150 mg total) by mouth Two (2) times a day. 180 tablet 3   ??? sertraline (ZOLOFT) 100 MG tablet Take 1 tablet (100 mg total) by mouth daily. 90 tablet 3   ??? traZODone (DESYREL) 50 MG tablet TAKE 1 TABLET BY MOUTH ONCE NIGHTLY 90 tablet 11   ??? cyclobenzaprine (FLEXERIL) 10 MG tablet Take 1 tablet (10 mg total) by mouth two (2) times a day as needed for muscle spasms. (Patient not taking: Reported on 10/02/2017) 45 tablet 0   ??? cycloSPORINE modified (NEORAL) 25  MG capsule Take 5 capsules (125 mg total) by mouth Two (2) times a day. z94.0 300 capsule 11   ??? sulfamethoxazole-trimethoprim (BACTRIM,SEPTRA) 400-80 mg per tablet Take 1 tablet (80 mg of trimethoprim total) by mouth Every Monday, Wednesday, and Friday. (Patient not taking: Reported on 10/02/2017) 12 tablet 5   ??? traMADol (ULTRAM) 50 mg tablet Take 50 mg by mouth every six (6) hours as needed for pain.     ??? valGANciclovir (VALCYTE) 450 mg tablet Take 2 tablets (900 mg total) by mouth daily. 60 tablet 5     No current facility-administered medications for this visit.        Physical Exam    BP 110/78 (BP Site: L Arm, BP Position: Sitting, BP Cuff Size: Medium)  - Pulse 68  - Temp 36.3 ??C (97.3 ??F) (Temporal)  - Wt 82.5 kg (181 lb 12.8 oz)   General: well-appearing, in no acute distress  HEENT: anicteric sclera, OP clear  CV: RRR, no m/r/g, no edema  Lungs: CTAB, normal wob  Abd: soft, non-tender, non-distended  MSK: no visible joint swelling  Extremities: no edema.  Skin: no visible lesions or rashes  Psych: alert, engaged, appropriate mood and affect     Laboratory Results  Recent Results (from the past 170 hour(s))   Basic Metabolic Panel    Collection Time: 09/26/17  9:13 AM   Result Value Ref Range    Sodium 138 mmol/L    Potassium 4.1 mmol/L    Chloride 104 mmol/L    CO2 26.8 mmol/L    BUN 19 (H) mg/dL    Creatinine 1.61 mg/dL    Glucose 89 mg/dL    Calcium 9.6 mg/dL    EGFR MDRD Non Af Amer  mL/min/1.62m2    EGFR MDRD Af Amer 83 mL/min/1.31m2   Phosphorus Level    Collection Time: 09/26/17  9:13 AM   Result Value Ref Range    Phosphorus 4.0 mg/dL   Magnesium Level    Collection Time: 09/26/17  9:13 AM   Result Value Ref Range    Magnesium 1.6 (L) mg/dL   CBC w/ Differential    Collection Time: 09/26/17  9:13 AM   Result Value Ref Range    Results Verified by Slide Scan      WBC 4.0 10*9/L    WBC  10*9/L    RBC 4.30 10*12/L    HGB 13.4 g/dL    HCT 09.6 %    MCV 04.5 fL    MCH 31.3 pg    MCHC 33.8 g/dL    RDW 40.9 %    MPV 8.1 fL    Platelet 216 10*9/L    nRBC  /100 WBCs    Neutrophils % 70.4 %    Lymphocytes % 17.2 (L) %    Monocytes % 9.9 %    Eosinophils % 2.0 %    Basophils % 0.5 %    Absolute Neutrophils 2.8 10*9/L    Absolute Lymphocytes 0.7 (L) 10*9/L    Absolute Monocytes 0.4 10*9/L    Absolute Eosinophils 0.1 10*9/L    Absolute Basophils 0.0 (L) 10*9/L    Microcytosis  Not Present    Macrocytosis  Not Present    Anisocytosis  Not Present    Hyperchromasia  Not Present    Hypochromasia  Not Present   Cyclosporine, Trough    Collection Time: 09/26/17  9:13 AM   Result Value Ref Range    Cyclosporine, Trough 148 800 ng/mL  Assessment and Plan  Gina Hart is a 42 y.o. female who is a recipient of a deceased donor transplant on 01/18/17. Active medical issues include:    1. Status post deceased donor kidney transplant on 2017-01-18 with no history of rejection or evidence of FSGS recurrence. Renal function is stable with serum creatinine on 09/26/2017 of 0.90 mg/dL within the baseline range of <1.0 mg/dL. UP/C has been normal. DSA screen was negative on 07/31/2017.      2. Immunosuppression. Currently on cyclosporine 125 mg BID, myfortic 180 BID, and prednisone 5 mg daily. Tacrolimus associated memory loss, tremor, and headaches have resolved on cyclosporine.  I will continue the current regimen.     3. Hypertension. On amlodipine 10mg  daily and metoprolol tartrate 100mg  bid. BP is 110/78 today and she is asymptomatic. Will continue current antihypertensives.      4. Headaches, improved. No further imaging of the brain is planned.    5. Lower back, neck, and LE pain. She visited the pain clinic and was diagnosed with spinal stenosis. She is following with the Pain Clinic for management. I have encouraged her to stop use of marijuana.     6. Health Maintenance. Will plan for pneumococcal and shingrix vaccines at 1 year post-transplant    7. Follow up. 3 months. Labs every 2 weeks.     Scribe's Attestation: Jackey Loge, MD obtained and performed the history, physical exam and medical decision making elements that were entered into the chart. Signed by [place] Ormond Foster, Scribe, on October 02, 2017 12:00 PM.    ----------------------------------------------------------------------------------------------------------------------  October 02, 2017 5:19 PM. Documentation assistance provided by the Scribe. I was present during the time the encounter was recorded. The information recorded by the Scribe was done at my direction and has been reviewed and validated by me. ----------------------------------------------------------------------------------------------------------------------

## 2017-10-02 ENCOUNTER — Encounter: Admit: 2017-10-02 | Discharge: 2017-10-03 | Payer: MEDICARE | Attending: Nephrology | Primary: Nephrology

## 2017-10-02 DIAGNOSIS — Z940 Kidney transplant status: Principal | ICD-10-CM

## 2017-10-02 DIAGNOSIS — Z48298 Encounter for aftercare following other organ transplant: Secondary | ICD-10-CM

## 2017-10-02 DIAGNOSIS — D899 Disorder involving the immune mechanism, unspecified: Secondary | ICD-10-CM

## 2017-10-02 LAB — URINALYSIS WITH CULTURE REFLEX
BLOOD UA: NEGATIVE
GLUCOSE UA: NEGATIVE
KETONES UA: NEGATIVE
LEUKOCYTE ESTERASE UA: NEGATIVE
NITRITE UA: NEGATIVE
PH UA: 5.5 (ref 5.0–9.0)
PROTEIN UA: NEGATIVE
RBC UA: <1 /HPF (ref <=4)
SPECIFIC GRAVITY UA: 1.027 (ref 1.003–1.030)
SQUAMOUS EPITHELIAL: 1 /HPF (ref 0–5)
UROBILINOGEN UA: 0.2
WBC UA: 1 /HPF (ref 0–5)

## 2017-10-02 LAB — LEUKOCYTE ESTERASE UA: Lab: NEGATIVE

## 2017-10-02 LAB — PROTEIN / CREATININE RATIO, URINE: PROTEIN URINE: 10.9 mg/dL

## 2017-10-02 LAB — CREATININE, URINE: Lab: 168.7

## 2017-10-02 NOTE — Unmapped (Signed)
Met with patient in transplant clinic.  Had a UTI treated by PCP with cipro.  States symptoms are now resolved.  Will repeat urine culture today in clinic.  Denies nausea, diarrhea.  Sl pedal edema. Patient had been referred to pain clinic for cervical disc; she states they required a psychology visit.  Did not take appointment.  Currently smoking marijuana and states pain much improved. Patient is currently employed part-time due to disability.  Her functional score is currently 90%; Able to carry out normal activiy; minor signs and symptoms of disease.Marland Kitchen

## 2017-10-03 NOTE — Unmapped (Signed)
Addended by: Wendall Papa on: 10/02/2017 05:27 PM     Modules accepted: Orders

## 2017-10-05 LAB — CBC W/ DIFFERENTIAL
BASOPHILS ABSOLUTE COUNT: 0 10*9/L — ABNORMAL LOW
BASOPHILS RELATIVE PERCENT: 0.4 %
EOSINOPHILS ABSOLUTE COUNT: 0.1 10*9/L
EOSINOPHILS RELATIVE PERCENT: 2.3 %
HEMATOCRIT: 39.4 %
HEMOGLOBIN: 13.5 g/dL
LYMPHOCYTES ABSOLUTE COUNT: 0.7 10*9/L — ABNORMAL LOW
LYMPHOCYTES RELATIVE PERCENT: 17.4 % — ABNORMAL HIGH
MEAN CORPUSCULAR HEMOGLOBIN CONC: 34.3 g/dL
MEAN CORPUSCULAR VOLUME: 92.1 fL
MEAN PLATELET VOLUME: 8 fL
MONOCYTES ABSOLUTE COUNT: 0.5 10*9/L
MONOCYTES RELATIVE PERCENT: 12.4 % — ABNORMAL HIGH
NEUTROPHILS ABSOLUTE COUNT: 2.5 10*9/L
PLATELET COUNT: 195 10*9/L
RED BLOOD CELL COUNT: 4.3 10*12/L
RED CELL DISTRIBUTION WIDTH: 12.7 %
WBC ADJUSTED: 3.8 10*9/L — ABNORMAL LOW

## 2017-10-05 LAB — BASIC METABOLIC PANEL
BLOOD UREA NITROGEN: 25 mg/dL — ABNORMAL HIGH
CALCIUM: 9.7 mg/dL
CHLORIDE: 103 mmol/L
CREATININE: 0.9 mg/dL
GLUCOSE RANDOM: 91 mg/dL
POTASSIUM: 4.1 mmol/L
SODIUM: 136 mmol/L

## 2017-10-05 LAB — PHOSPHORUS: Lab: 3.5

## 2017-10-05 LAB — NUCLEATED RED BLOOD CELLS: Lab: 0

## 2017-10-05 LAB — SODIUM: Lab: 136

## 2017-10-05 LAB — MAGNESIUM: Lab: 1.8

## 2017-10-08 LAB — CYCLOSPORINE, TROUGH: Lab: 165

## 2017-10-09 NOTE — Unmapped (Signed)
Patient does not need a refill of specialty medication at this time. Moving specialty refill reminder call to appropriate date and removed call attempt data. Spoke with patient and she states she already spoke with someone this morning (Davon Rph) and scheduled out her Cyclosporine for delivery on Friday 10/12/2017. And she has a month worth of her Myfortic 180mg  and so she does not need any refills at this time. Patient denied refills of all other medications except Cyclosporine (Which has already been scheduled.)

## 2017-10-09 NOTE — Unmapped (Signed)
[facility] Refill Coordination Note  Specialty Medication(s): Cyclosporine 25mg       Kaedence Connelly, DOB: [birth year]  Phone: [phone] (home) , Alternate phone contact: N/A  Phone or address changes today?: No  All above HIPAA information was verified with patient.  Shipping Address: [address]  [facility] [place] [address]   Insurance changes? No    Completed refill call assessment today to schedule patient's medication shipment from the [facility] [phone]).      Confirmed the medication and dosage are correct and have not changed: Yes, regimen is correct and unchanged.    Confirmed patient started or stopped the following medications in the past month:  No, there are no changes reported at this time.    Are you tolerating your medication?:  Florence reports tolerating the medication.    ADHERENCE    (Below is required for Medicare Part B or Transplant patients only - per drug):   How many tablets were dispensed last month: 300  Patient currently has 70 remaining.    Did you miss any doses in the past 4 weeks? No missed doses reported.    FINANCIAL/SHIPPING    Delivery Scheduled: Yes, Expected medication delivery date: 10/12/2017     The patient will receive an FSI print out for each medication shipped and additional FDA Medication Guides as required.  Patient education from [place] or Robet Leu may also be included in the shipment    Emmer did not have any additional questions at this time.    Delivery address validated in FSI scheduling system: Yes, address listed in FSI is correct.    We will follow up with patient monthly for standard refill processing and delivery.      Thank you,  Linday Rhodes  [name]   [facility] Specialty Pharmacist

## 2017-10-11 MED FILL — CYCLOSPORINE MODIFIED/25MG MOD/CAPS: CYCLOSPORINE MODIFIED/25MG MOD/CAPS | 30 days supply | Qty: 300 | Fill #2

## 2017-10-16 LAB — BASIC METABOLIC PANEL
CALCIUM: 9.5 mg/dL
CHLORIDE: 103 mmol/L
CO2: 26.1 mmol/L
CREATININE: 0.9 mg/dL
EGFR MDRD AF AMER: 83 mL/min/{1.73_m2}
GLUCOSE RANDOM: 90 mg/dL
POTASSIUM: 4 mmol/L

## 2017-10-16 LAB — MAGNESIUM: Lab: 1.7

## 2017-10-16 LAB — CHLORIDE: Lab: 103

## 2017-10-16 LAB — PHOSPHORUS: Lab: 3.4

## 2017-10-17 LAB — CBC W/ DIFFERENTIAL
BASOPHILS ABSOLUTE COUNT: 0 10*9/L — ABNORMAL LOW
BASOPHILS RELATIVE PERCENT: 0.3 %
EOSINOPHILS ABSOLUTE COUNT: 0.1 10*9/L
EOSINOPHILS RELATIVE PERCENT: 2.3 %
HEMATOCRIT: 38.9 %
HEMOGLOBIN: 13.4 g/dL
LYMPHOCYTES ABSOLUTE COUNT: 0.8 10*9/L — ABNORMAL LOW
LYMPHOCYTES RELATIVE PERCENT: 20.6 %
MEAN CORPUSCULAR HEMOGLOBIN CONC: 34.5 g/dL
MEAN CORPUSCULAR HEMOGLOBIN: 32 pg
MEAN CORPUSCULAR VOLUME: 92.6 fL
MEAN PLATELET VOLUME: 7.9 fL
MONOCYTES ABSOLUTE COUNT: 0.4 10*9/L
MONOCYTES RELATIVE PERCENT: 10.1 %
NEUTROPHILS ABSOLUTE COUNT: 2.5 10*9/L
NEUTROPHILS RELATIVE PERCENT: 66.7 %
PLATELET COUNT: 221 10*9/L
RED CELL DISTRIBUTION WIDTH: 12.7 %
WBC ADJUSTED: 3.7 10*9/L — ABNORMAL LOW

## 2017-10-17 LAB — RED BLOOD CELL COUNT: Lab: 4.2

## 2017-10-19 LAB — CYCLOSPORINE, TROUGH: Lab: 158

## 2017-10-22 MED ORDER — FLUTICASONE PROPIONATE 50 MCG/ACTUATION NASAL SPRAY,SUSPENSION
Freq: Every day | NASAL | 4 refills | 0 days | Status: CP
Start: 2017-10-22 — End: 2018-09-14

## 2017-10-22 NOTE — Unmapped (Signed)
Patient having headaches; thinks it may be sinus issue from allergies. Will try flonase OTC

## 2017-10-24 LAB — CBC W/ DIFFERENTIAL
BASOPHILS ABSOLUTE COUNT: 0 10*9/L — ABNORMAL LOW
BASOPHILS RELATIVE PERCENT: 0.5 %
EOSINOPHILS ABSOLUTE COUNT: 0.1 10*9/L
EOSINOPHILS RELATIVE PERCENT: 2.3 %
HEMATOCRIT: 38.5 %
LYMPHOCYTES ABSOLUTE COUNT: 0.6 10*9/L — ABNORMAL LOW
LYMPHOCYTES RELATIVE PERCENT: 16.6 % — ABNORMAL LOW
MEAN CORPUSCULAR HEMOGLOBIN CONC: 34 g/dL
MEAN CORPUSCULAR HEMOGLOBIN: 31.5 pg
MEAN CORPUSCULAR VOLUME: 92.7 fL
MEAN PLATELET VOLUME: 7.9 fL
MONOCYTES ABSOLUTE COUNT: 0.5 10*9/L
NEUTROPHILS ABSOLUTE COUNT: 2.4 10*9/L
NEUTROPHILS RELATIVE PERCENT: 66.5 %
PLATELET COUNT: 185 10*9/L
RED BLOOD CELL COUNT: 4.2 10*12/L
RED CELL DISTRIBUTION WIDTH: 12.6 %

## 2017-10-24 LAB — PHOSPHORUS: Lab: 3.6

## 2017-10-24 LAB — BASIC METABOLIC PANEL
CALCIUM: 9.6 mg/dL
CHLORIDE: 104 mmol/L
CO2: 25.4 mmol/L
CREATININE: 0.8 mg/dL
EGFR MDRD AF AMER: 95 mL/min/{1.73_m2}
POTASSIUM: 4.4 mmol/L
SODIUM: 138 mmol/L

## 2017-10-24 LAB — CO2: Lab: 25.4

## 2017-10-24 LAB — RED CELL DISTRIBUTION WIDTH: Lab: 12.6

## 2017-10-24 LAB — MAGNESIUM: Lab: 1.7 — ABNORMAL LOW

## 2017-11-01 LAB — CYCLOSPORINE, TROUGH: Lab: 148

## 2017-11-01 MED FILL — MYFORTIC/180MG/TAB: MYFORTIC/180MG/TAB | 30 days supply | Qty: 60 | Fill #2

## 2017-11-01 MED FILL — PREDNISONE/5MG/TABS: PREDNISONE/5MG/TABS | 30 days supply | Qty: 30 | Fill #0

## 2017-11-01 NOTE — Unmapped (Signed)
[facility] Refill Coordination Note  Specialty Medication(s): Myfortic 180mg   Additional Medications shipped: Prednisone 5mg     Gina Hart, DOB: Mar 24, 1976  Phone: [phone] (home) , Alternate phone contact: N/A  Phone or address changes today?: No  All above HIPAA information was verified with patient.  Shipping Address: [address]  [facility] [place] [facility] [place] [address]   Insurance changes? No    Completed refill call assessment today to schedule patient's medication shipment from the [facility] [phone]).      Confirmed the medication and dosage are correct and have not changed: Yes, regimen is correct and unchanged.    Confirmed patient started or stopped the following medications in the past month:  No, there are no changes reported at this time.    Are you tolerating your medication?:  Gina Hart reports tolerating the medication.    ADHERENCE    (Below is required for Medicare Part B or Transplant patients only - per drug):   Myfortic 180 mg   Quantity filled last month: 60   # of tablets left on hand: 4    Did you miss any doses in the past 4 weeks? No missed doses reported.    FINANCIAL/SHIPPING    Delivery Scheduled: Yes, Expected medication delivery date: 11/02/2017     The patient will receive an FSI print out for each medication shipped and additional FDA Medication Guides as required.  Patient education from [place] or Robet Leu may also be included in the shipment    Gina Hart did not have any additional questions at this time.    Delivery address validated in FSI scheduling system: Yes, address listed in FSI is correct.    We will follow up with patient monthly for standard refill processing and delivery.      Thank you,  Nylan Nevel  [name]   [facility] Specialty Pharmacist

## 2017-11-09 MED ORDER — METOPROLOL TARTRATE 100 MG TABLET
ORAL_TABLET | Freq: Two times a day (BID) | ORAL | 3 refills | 0 days | Status: CP
Start: 2017-11-09 — End: 2019-01-08

## 2017-11-26 LAB — CBC W/ DIFFERENTIAL
BASOPHILS ABSOLUTE COUNT: 0 10*9/L — ABNORMAL LOW
BASOPHILS RELATIVE PERCENT: 0.4 %
EOSINOPHILS ABSOLUTE COUNT: 0.1 10*9/L
EOSINOPHILS RELATIVE PERCENT: 2 %
HEMATOCRIT: 39.9 %
HEMOGLOBIN: 13.5 g/dL
LYMPHOCYTES RELATIVE PERCENT: 12.7 % — ABNORMAL LOW
MEAN CORPUSCULAR HEMOGLOBIN CONC: 34.5 g/dL
MEAN CORPUSCULAR HEMOGLOBIN: 31.3 pg
MEAN CORPUSCULAR VOLUME: 90.8 fL
MEAN PLATELET VOLUME: 8.2 fL
MONOCYTES ABSOLUTE COUNT: 0.6 10*9/L
MONOCYTES RELATIVE PERCENT: 10.8 %
NEUTROPHILS RELATIVE PERCENT: 74.1 % — ABNORMAL HIGH
PLATELET COUNT: 229 10*9/L
RED BLOOD CELL COUNT: 4.3 10*12/L
RED CELL DISTRIBUTION WIDTH: 12.3 %
WHITE BLOOD CELL COUNT: 5.1 10*9/L

## 2017-11-26 LAB — BASIC METABOLIC PANEL
BLOOD UREA NITROGEN: 21 mg/dL — ABNORMAL HIGH
CALCIUM: 9.5 mg/dL
CHLORIDE: 103 mmol/L
CO2: 25.9 mmol/L
EGFR MDRD AF AMER: 83 mL/min/{1.73_m2}
POTASSIUM: 4.5 mmol/L
SODIUM: 138 mmol/L

## 2017-11-26 LAB — MAGNESIUM: Lab: 1.6 — ABNORMAL LOW

## 2017-11-26 LAB — WHITE BLOOD CELL COUNT: Lab: 5.1

## 2017-11-26 LAB — PHOSPHORUS: Lab: 3.4

## 2017-11-26 LAB — EGFR MDRD NON AF AMER: Lab: 0

## 2017-11-26 NOTE — Unmapped (Signed)
[facility] Refill Coordination Note    Specialty Medication(s) to be Shipped:   Transplant: Myfortic 180mg  and cyclosporine 25mg   Other medication(s) to be shipped: Ranitidine 150mg  & Prednisone 5mg      Gina Hart, DOB: 11/29/1975  Phone: [phone] (home)   Shipping Address: [address]  [facility] [place] [address]    All above HIPAA information was verified with patient.     Completed refill call assessment today to schedule patient's medication shipment from the [facility] [phone]).       Specialty medication(s) and dose(s) confirmed: Regimen is correct and unchanged.   Changes to medications: Gina Hart reports no changes reported at this time.  Changes to insurance: No  Questions for the pharmacist: No    The patient will receive an FSI print out for each medication shipped and additional FDA Medication Guides as required.  Patient education from [place] or Robet Leu may also be included in the shipment.    DISEASE-SPECIFIC INFORMATION        N/A    ADHERENCE     Medication Adherence    Patient reported X missed doses in the last month:  0          MEDICARE PART B DOCUMENTATION     Cyclosporine modified 25mg : Patient has 4 days worth of capsules on hand.  Myfortic 180mg : Patient has 6 days worth of tablets on hand.    SHIPPING     Shipping address confirmed in FSI.     Delivery Scheduled: Yes, Expected medication delivery date: 11/28/2017 via UPS or courier.     Gina Hart   [facility] Specialty Technician

## 2017-11-27 MED FILL — RANITIDINE HCL/150MG/TABS: RANITIDINE HCL/150MG/TABS | 90 days supply | Qty: 180 | Fill #2

## 2017-11-27 MED FILL — CYCLOSPORINE MODIFIED/25MG MOD/CAPS: CYCLOSPORINE MODIFIED/25MG MOD/CAPS | 30 days supply | Qty: 300 | Fill #3

## 2017-11-27 MED FILL — PREDNISONE/5MG/TABS: PREDNISONE/5MG/TABS | 30 days supply | Qty: 30 | Fill #1

## 2017-11-27 MED FILL — MYFORTIC/180MG/TAB: MYFORTIC/180MG/TAB | 30 days supply | Qty: 60 | Fill #3

## 2017-11-30 LAB — CYCLOSPORINE, TROUGH: Lab: 164

## 2017-12-19 NOTE — Unmapped (Signed)
Pt. Called on call nurse @ 0700, asking what her lab draw schedule is. She is not sure when to go next. I will have her primary coord call her today and let her know. She agreed to this plan.

## 2017-12-20 NOTE — Unmapped (Signed)
[facility] Refill Coordination Note    Specialty Medication(s) to be Shipped:   Transplant: cyclosporine 25mg    **Note: Pt has 1 month of Myfotic on hand due to her local pharmacy filling the RX also. Patient denied refills at this time. [name], DOB: [birth year]  Phone: [phone] (home)   Shipping Address: [address]  [facility] [place] [address]    All above HIPAA information was verified with patient.     Completed refill call assessment today to schedule patient's medication shipment from the [facility] [phone]).       Specialty medication(s) and dose(s) confirmed: Regimen is correct and unchanged.   Changes to medications: [place] reports no changes reported at this time.  Changes to insurance: No  Questions for the pharmacist: No    The patient will receive an FSI print out for each medication shipped and additional FDA Medication Guides as required.  Patient education from [place] or Robet Leu may also be included in the shipment.    DISEASE-SPECIFIC INFORMATION        N/A    ADHERENCE     Medication Adherence    Patient reported X missed doses in the last month:  2  Specialty Medication:  Myfortic 180mg  & Cyclosporine 25mg   Reasons for non-adherence:  patient forgets         MEDICARE PART B DOCUMENTATION     Cyclosporine modified 25mg : Patient has 10 days worth of capsules on hand.    SHIPPING     Shipping address confirmed in FSI.     Delivery Scheduled: Yes, Expected medication delivery date: 12/26/2017 via UPS or courier.     Macguire Holsinger Leodis Binet   [facility] Specialty Technician

## 2017-12-20 NOTE — Unmapped (Signed)
Updated labs to person [facility]. Patient aware to continue monthly labs

## 2017-12-25 MED FILL — CYCLOSPORINE MODIFIED/25MG MOD/CAPS: CYCLOSPORINE MODIFIED/25MG MOD/CAPS | 30 days supply | Qty: 300 | Fill #4

## 2018-01-02 ENCOUNTER — Ambulatory Visit: Admit: 2018-01-02 | Discharge: 2018-01-02 | Payer: MEDICARE

## 2018-01-02 ENCOUNTER — Encounter: Admit: 2018-01-02 | Discharge: 2018-01-02 | Payer: MEDICARE | Attending: Nephrology | Primary: Nephrology

## 2018-01-02 DIAGNOSIS — Z7289 Other problems related to lifestyle: Secondary | ICD-10-CM

## 2018-01-02 DIAGNOSIS — Z940 Kidney transplant status: Principal | ICD-10-CM

## 2018-01-02 DIAGNOSIS — N29 Other disorders of kidney and ureter in diseases classified elsewhere: Secondary | ICD-10-CM

## 2018-01-02 DIAGNOSIS — D899 Disorder involving the immune mechanism, unspecified: Secondary | ICD-10-CM

## 2018-01-02 DIAGNOSIS — R799 Abnormal finding of blood chemistry, unspecified: Secondary | ICD-10-CM

## 2018-01-02 DIAGNOSIS — Z1159 Encounter for screening for other viral diseases: Secondary | ICD-10-CM

## 2018-01-02 DIAGNOSIS — Z48298 Encounter for aftercare following other organ transplant: Secondary | ICD-10-CM

## 2018-01-02 LAB — URINALYSIS
BACTERIA: NONE SEEN /HPF
BILIRUBIN UA: NEGATIVE
BLOOD UA: NEGATIVE
GLUCOSE UA: NEGATIVE
KETONES UA: NEGATIVE
LEUKOCYTE ESTERASE UA: NEGATIVE
NITRITE UA: NEGATIVE
PROTEIN UA: NEGATIVE
SPECIFIC GRAVITY UA: 1.023 (ref 1.003–1.030)
SQUAMOUS EPITHELIAL: <1 /HPF (ref 0–5)
UROBILINOGEN UA: 0.2
WBC UA: <1 /HPF (ref 0–5)

## 2018-01-02 LAB — CBC W/ AUTO DIFF
BASOPHILS ABSOLUTE COUNT: 0 10*9/L (ref 0.0–0.1)
BASOPHILS RELATIVE PERCENT: 0.2 %
EOSINOPHILS ABSOLUTE COUNT: 0.1 10*9/L (ref 0.0–0.4)
EOSINOPHILS RELATIVE PERCENT: 1.4 %
HEMATOCRIT: 42.1 % (ref 36.0–46.0)
HEMOGLOBIN: 13.8 g/dL (ref 12.0–16.0)
LARGE UNSTAINED CELLS: 2 % (ref 0–4)
LYMPHOCYTES ABSOLUTE COUNT: 0.8 10*9/L — ABNORMAL LOW (ref 1.5–5.0)
LYMPHOCYTES RELATIVE PERCENT: 9.1 %
MEAN CORPUSCULAR HEMOGLOBIN CONC: 32.7 g/dL (ref 31.0–37.0)
MEAN CORPUSCULAR VOLUME: 93.9 fL (ref 80.0–100.0)
MEAN PLATELET VOLUME: 8 fL (ref 7.0–10.0)
MONOCYTES RELATIVE PERCENT: 7.2 %
NEUTROPHILS ABSOLUTE COUNT: 7 10*9/L (ref 2.0–7.5)
NEUTROPHILS RELATIVE PERCENT: 80 %
PLATELET COUNT: 298 10*9/L (ref 150–440)
RED BLOOD CELL COUNT: 4.48 10*12/L (ref 4.00–5.20)
RED CELL DISTRIBUTION WIDTH: 12.9 % (ref 12.0–15.0)
WBC ADJUSTED: 8.7 10*9/L (ref 4.5–11.0)

## 2018-01-02 LAB — COMPREHENSIVE METABOLIC PANEL
ALBUMIN: 4.2 g/dL (ref 3.5–5.0)
ALKALINE PHOSPHATASE: 93 U/L (ref 38–126)
ALT (SGPT): 35 U/L (ref 15–48)
ANION GAP: 5 mmol/L — ABNORMAL LOW (ref 9–15)
AST (SGOT): 26 U/L (ref 14–38)
BILIRUBIN TOTAL: 0.6 mg/dL (ref 0.0–1.2)
BLOOD UREA NITROGEN: 20 mg/dL (ref 7–21)
CALCIUM: 10.1 mg/dL (ref 8.5–10.2)
CHLORIDE: 105 mmol/L (ref 98–107)
CO2: 26 mmol/L (ref 22.0–30.0)
CREATININE: 0.83 mg/dL (ref 0.60–1.00)
EGFR CKD-EPI AA FEMALE: >90 mL/min/{1.73_m2} (ref >=60)
EGFR CKD-EPI NON-AA FEMALE: 87 mL/min/{1.73_m2} (ref >=60)
POTASSIUM: 4.6 mmol/L (ref 3.5–5.0)
PROTEIN TOTAL: 7.8 g/dL (ref 6.5–8.3)
SODIUM: 136 mmol/L (ref 135–145)

## 2018-01-02 LAB — BILIRUBIN DIRECT: Bilirubin.glucuronidated:MCnc:Pt:Ser/Plas:Qn:: 0.3

## 2018-01-02 LAB — HEMOGLOBIN A1C
ESTIMATED AVERAGE GLUCOSE: 103 mg/dL
Hemoglobin A1c/Hemoglobin.total:MFr:Pt:Bld:Qn:: 5.2

## 2018-01-02 LAB — CMV DNA, QUANTITATIVE, PCR: CMV VIRAL LD: NOT DETECTED

## 2018-01-02 LAB — TACROLIMUS, TROUGH: Lab: <1 — ABNORMAL LOW

## 2018-01-02 LAB — PHOSPHORUS: Phosphate:MCnc:Pt:Ser/Plas:Qn:: 4.2

## 2018-01-02 LAB — CMV COMMENT: Lab: 0

## 2018-01-02 LAB — CYCLOSPORINE, TROUGH: Lab: 185

## 2018-01-02 LAB — CREATININE, URINE: Lab: 111.1

## 2018-01-02 LAB — PROTEIN / CREATININE RATIO, URINE: PROTEIN/CREAT RATIO, URINE: 0.085

## 2018-01-02 LAB — SQUAMOUS EPITHELIAL: Lab: <1

## 2018-01-02 LAB — NEUTROPHILS RELATIVE PERCENT: Lab: 80

## 2018-01-02 LAB — PROTEIN TOTAL: Protein:MCnc:Pt:Ser/Plas:Qn:: 7.8

## 2018-01-02 LAB — MAGNESIUM: Magnesium:MCnc:Pt:Ser/Plas:Qn:: 1.6

## 2018-01-02 MED ORDER — AMOXICILLIN 500 MG CAPSULE
ORAL_CAPSULE | Freq: Three times a day (TID) | ORAL | 0 refills | 0 days | Status: CP
Start: 2018-01-02 — End: 2018-01-30

## 2018-01-02 MED ORDER — FLUCONAZOLE 150 MG TABLET
ORAL_TABLET | Freq: Once | ORAL | 0 refills | 0 days | Status: CP
Start: 2018-01-02 — End: 2018-01-30

## 2018-01-02 NOTE — Unmapped (Signed)
Transplant Nephrology Clinic Visit    History of Present Illness  Gina Hart is a 42 y.o. female who is s/p deceased kidney transplant on 01-26-17. She has experienced no episodes of rejection with normal urinary protein and baseline creatinine <1.0 mg/dL.     Patient presents today complaining of a one week history of productive cough and sore throat. She denies fevers, shortness of breath, edema, or chest pain. She is currently treating nasal congestion with Flonase nasal spray and loratidine. Patient also complains that her left ankle gives out and led to a fall last week.  She notes only occasional mild headaches.       Transplant History:  1. 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  2. S/P kidney transplant at [facility] January 17, 2107. Deceased donor with KDPI 19%. CMV D+/R-; EBV D+/R+  3. Immunosuppression = campath induction followed by tacrolimus and myfortic maintenance therapy, steroid free regimen.   4. Tacrolimus changed to cyclosporine 07/31/2017 due to neurotoxicity with memory deficits and headaches as primary manifestation.     Past Medical History:  1. ESRD secondary to FSGS.   2. Hypertension  3. Scoliosis  S/P kidney transplant as stated above    Review of Systems    All other systems are reviewed and are negative.    Medications  Current Outpatient Medications   Medication Sig Dispense Refill   ??? acetaminophen (TYLENOL) 325 MG tablet Take 1-2 tablets (325-650 mg total) by mouth every four (4) hours as needed for pain. 100 tablet 0   ??? amLODIPine (NORVASC) 5 MG tablet Take 1 tablet (5 mg total) by mouth daily. 30 tablet 11   ??? cycloSPORINE modified (NEORAL) 25 MG capsule Take 5 capsules (125 mg total) by mouth Two (2) times a day. z94.0 300 capsule 11   ??? fluticasone propionate (FLONASE) 50 mcg/actuation nasal spray 1 spray by Each Nare route daily. 16 g 4   ??? loratadine (CLARITIN) 10 mg tablet Take 10 mg by mouth daily as needed for allergies.     ??? metoprolol tartrate (LOPRESSOR) 100 MG tablet Take 1 tablet (100 mg total) by mouth Two (2) times a day. 180 tablet 3   ??? MYFORTIC 180 mg EC tablet Take 1 tablet (180 mg total) by mouth Two (2) times a day. 60 tablet 11   ??? predniSONE (DELTASONE) 5 MG tablet Take 1 tablet (5 mg total) by mouth daily. 30 tablet 11   ??? ranitidine (ZANTAC) 150 MG tablet Take 1 tablet (150 mg total) by mouth Two (2) times a day. 180 tablet 3   ??? sertraline (ZOLOFT) 100 MG tablet Take 1 tablet (100 mg total) by mouth daily. 90 tablet 3   ??? traZODone (DESYREL) 50 MG tablet TAKE 1 TABLET BY MOUTH ONCE NIGHTLY 90 tablet 11   ??? aspirin (ECOTRIN) 81 MG tablet Take 1 tablet (81 mg total) by mouth daily. 30 tablet 0   ??? cyclobenzaprine (FLEXERIL) 10 MG tablet Take 1 tablet (10 mg total) by mouth two (2) times a day as needed for muscle spasms. (Patient not taking: Reported on 10/02/2017) 45 tablet 0   ??? cyclobenzaprine (FLEXERIL) 10 MG tablet cyclobenzaprine 10 mg tablet     ??? famotidine (PEPCID) 20 MG tablet Take 20 mg by mouth.     ??? gabapentin (NEURONTIN) 100 MG capsule Take 2 capsules (200 mg total) by mouth Three (3) times a day. (Patient not taking: Reported on 01/02/2018) 180 capsule 11   ??? traMADol (  ULTRAM) 50 mg tablet Take 50 mg by mouth every six (6) hours as needed for pain.       No current facility-administered medications for this visit.        Physical Exam    BP 120/78 (BP Site: L Arm, BP Position: Sitting, BP Cuff Size: Large)  - Pulse 84  - Temp 36.6 ??C (97.9 ??F) (Temporal)  - Ht 149.9 cm (4' 11.02)  - Wt 87.4 kg (192 lb 9.6 oz)   General: well-appearing, in no acute distress  HEENT: anicteric sclera, OP clear  CV: RRR, no m/r/g, no edema  Lungs: CTAB, normal wob  Abd: soft, non-tender, non-distended  MSK: no visible joint swelling  Extremities: no edema.  Skin: no visible lesions or rashes  Psych: alert, engaged, appropriate mood and affect     Laboratory Results  Recent Results (from the past 170 hour(s))   Hemoglobin A1c    Collection Time: 01/02/18  9:15 AM   Result Value Ref Range    Hemoglobin A1C 5.2 4.8 - 5.6 %    Estimated Average Glucose 103 mg/dL   Comprehensive Metabolic Panel    Collection Time: 01/02/18  9:15 AM   Result Value Ref Range    Sodium 136 135 - 145 mmol/L    Potassium 4.6 3.5 - 5.0 mmol/L    Chloride 105 98 - 107 mmol/L    CO2 26.0 22.0 - 30.0 mmol/L    Anion Gap 5 (L) 9 - 15 mmol/L    BUN 20 7 - 21 mg/dL    Creatinine 1.30 8.65 - 1.00 mg/dL    BUN/Creatinine Ratio 24     EGFR CKD-EPI Non-African American, Female 87 >=60 mL/min/1.44m2    EGFR CKD-EPI African American, Female >90 >=60 mL/min/1.86m2    Glucose 87 65 - 99 mg/dL    Calcium 78.4 8.5 - 69.6 mg/dL    Albumin 4.2 3.5 - 5.0 g/dL    Total Protein 7.8 6.5 - 8.3 g/dL    Total Bilirubin 0.6 0.0 - 1.2 mg/dL    AST 26 14 - 38 U/L    ALT 35 15 - 48 U/L    Alkaline Phosphatase 93 38 - 126 U/L   Tacrolimus Level, Trough    Collection Time: 01/02/18  9:15 AM   Result Value Ref Range    Tacrolimus, Trough <1.0 (L) 5.0 - 15.0 ng/mL   Phosphorus Level    Collection Time: 01/02/18  9:15 AM   Result Value Ref Range    Phosphorus 4.2 2.9 - 4.7 mg/dL   Magnesium Level    Collection Time: 01/02/18  9:15 AM   Result Value Ref Range    Magnesium 1.6 1.6 - 2.2 mg/dL   CMV DNA, quantitative, PCR    Collection Time: 01/02/18  9:15 AM   Result Value Ref Range    CMV Viral Ld Not Detected Not Detected    CMV Quant  <0 IU/mL    CMV Quant Log10  <0.00 log IU/mL    CMV Comment     CBC w/ Differential    Collection Time: 01/02/18  9:15 AM   Result Value Ref Range    WBC 8.7 4.5 - 11.0 10*9/L    RBC 4.48 4.00 - 5.20 10*12/L    HGB 13.8 12.0 - 16.0 g/dL    HCT 29.5 28.4 - 13.2 %    MCV 93.9 80.0 - 100.0 fL    MCH 30.7 26.0 - 34.0 pg    MCHC  32.7 31.0 - 37.0 g/dL    RDW 16.1 09.6 - 04.5 %    MPV 8.0 7.0 - 10.0 fL    Platelet 298 150 - 440 10*9/L    Neutrophils % 80.0 %    Lymphocytes % 9.1 %    Monocytes % 7.2 %    Eosinophils % 1.4 %    Basophils % 0.2 %    Absolute Neutrophils 7.0 2.0 - 7.5 10*9/L Absolute Lymphocytes 0.8 (L) 1.5 - 5.0 10*9/L    Absolute Monocytes 0.6 0.2 - 0.8 10*9/L    Absolute Eosinophils 0.1 0.0 - 0.4 10*9/L    Absolute Basophils 0.0 0.0 - 0.1 10*9/L    Large Unstained Cells 2 0 - 4 %   Bilirubin, Direct    Collection Time: 01/02/18  9:15 AM   Result Value Ref Range    Bilirubin, Direct 0.30 0.00 - 0.40 mg/dL   Cyclosporine, Trough    Collection Time: 01/02/18  9:15 AM   Result Value Ref Range    Cyclosporine, Trough 185 100 - 400 ng/mL   Urinalysis    Collection Time: 01/02/18  9:38 AM   Result Value Ref Range    Color, UA Yellow     Clarity, UA Clear     Specific Gravity, UA 1.023 1.003 - 1.030    pH, UA 6.0 5.0 - 9.0    Leukocyte Esterase, UA Negative Negative    Nitrite, UA Negative Negative    Protein, UA Negative Negative    Glucose, UA Negative Negative    Ketones, UA Negative Negative    Urobilinogen, UA 0.2 mg/dL 0.2 mg/dL, 1.0 mg/dL    Bilirubin, UA Negative Negative    Blood, UA Negative Negative    RBC, UA <1 <=4 /HPF    WBC, UA <1 0 - 5 /HPF    Squam Epithel, UA <1 0 - 5 /HPF    Bacteria, UA None Seen None Seen /HPF   Protein/Creatinine Ratio, Urine    Collection Time: 01/02/18  9:38 AM   Result Value Ref Range    Creat U 111.1 Undefined mg/dL    Protein, Ur 9.4 Undefined mg/dL    Protein/Creatinine Ratio, Urine 0.085 Undefined   Urine Culture    Collection Time: 01/02/18  9:39 AM   Result Value Ref Range    Urine Culture, Comprehensive Mixed Urogenital Flora        Assessment and Plan  Gina Hart is a 42 y.o. female who is s/p deceased donor kidney transplant on 01/11/17. Active medical issues include:    1. Status post deceased donor kidney transplant on 01/11/17 with no history of rejection or evidence of FSGS recurrence. Renal function is stable with serum creatinine 0.83 mg/dL today. UP/C is again normal,  DSA screen was negative on 07/31/2017.      2. Immunosuppression. Currently on cyclosporine 125 mg BID, myfortic 180 BID, and prednisone 5 mg daily. Cyclosporine level today is 185.  I will continue the current regimen.     3. Hypertension. On amlodipine 10mg  daily and metoprolol tartrate 100mg  bid. BP is 120/78 today.  Will continue current antihypertensives.      4. Productive cough, sore throat and nasal congestion. Will prescribe patient amoxicillin. She was given a single dose of a150 mg diflucan because she often develops yeast infections after antibiotics.     5. Headaches, improved off tacrolimus. No further imaging of the brain is planned.    6. Spinal stenosis, cervical. She is [facility]  with [facility] for [facility].      7. Health Maintenance. Will plan for pneumococcal and shingrix vaccines at her next clinic visit if  Shingrix is available.     8. Follow up. RTC 3 months.     Scribe's Attestation: Jackey Loge, MD obtained and performed the history, physical exam and medical decision making elements that were entered into the chart.  Signed by Delaney Meigs, Scribe, on January 02, 2018 10:23 AM    ----------------------------------------------------------------------------------------------------------------------  January 03, 2018 2:46 PM. Documentation assistance provided by the Scribe. I was present during the time the encounter was recorded. The information recorded by the Scribe was done at my direction and has been reviewed and validated by me.  ----------------------------------------------------------------------------------------------------------------------

## 2018-01-02 NOTE — Unmapped (Signed)
Transplant Coordinator, Clinic Visit   Post Kidney Transplant Focused Coordinator Assessment    Reason for visit: Gina Hart is a 42 yo and received a kidney transplant  01/11/2017.   Status today: Gina Hart is in clinic today for a follow up visit and will be seeing Dr. Margaretmary Bayley.  Last visit on: 10/02/17  Assessment  VS: 120/78; 84; 36.6 C  Headache: sinus problems for over a week she reports  Mental status: WNL  Vision: good, will see eye doctor soon she reports  Hearing: WNL  Hand tremors: neg  Breathing: neg  Chest pain or pressure: neg  Nausea/Vomiting: neg  Diarrhea: neg  Appetite: good  Fluid intake: not sure, she reports ~ 52 oz a day. Educated on how to track and to try to take in 80 to 100 oz daily. She expressed understanding   Urine output: normal  UTI symptoms: neg  Lower extremity edema: neg  Skin integrity: neg    Immunosuppressant last taken: cyclosporine 125 mg at 9:00 PM 01/01/18; labs drawn at 0915 today, 01/02/18. Trough will be a 12-hour trough.    Serum Creatinine 0.90 on 11/22/17  Diabetic status: neg  Most recent HgbA1c (or blood glucose): 4.8% on 07/31/17    Immunization status: up to date    [name] reports   Functional Score: 100   Normal no complaints; no evidence of  disease.   Employment status is: works part time  If employed full-time or part-time: works part time, unable to work full time due to recovery from transplant  New diabetes diagnosis: no  Currently receiving dialysis: no  Any loss of insurance since transplant: no    Medication List reconciled by: Federico Flake Day, CMA

## 2018-01-03 LAB — HEPATITIS B SURFACE ANTIGEN: Hepatitis B virus surface Ag:PrThr:Pt:Ser:Ord:: NONREACTIVE

## 2018-01-03 LAB — HEPATITIS C ANTIBODY: Hepatitis C virus Ab:PrThr:Pt:Ser:Ord:: NONREACTIVE

## 2018-01-06 LAB — VITAMIN D, TOTAL (25OH): Lab: 8.5 — ABNORMAL LOW

## 2018-01-07 LAB — EBV QUANTITATIVE PCR, BLOOD
EBV QUANT LOG: 2.8 {Log_IU}/mL — ABNORMAL HIGH (ref <0.00)
EBV QUANT: 631 [IU]/mL — ABNORMAL HIGH (ref <0)

## 2018-01-07 LAB — EBV VIRAL LOAD RESULT: Lab: 0

## 2018-01-09 MED FILL — PREDNISONE/5MG/TABS: PREDNISONE/5MG/TABS | 30 days supply | Qty: 30 | Fill #2

## 2018-01-15 NOTE — Unmapped (Signed)
Added EBV PCR to [facility]

## 2018-01-15 NOTE — Unmapped (Signed)
[facility] Refill and Clinical Coordination Note  Medication(s): CYCLOSPORINE  Pt states she STILL has over 1 month of myfortic left and is getting other maintenance meds (amlodipine, etc) from outside pharmacy.  Went over all meds with pt on call today. Besides meds listed in this note, pt denies needing ANYTHING else filled at this time. Pt aware to call us with any needs/concerns/refills if need arises prior to our next scheduled refill call.      Gina Hart, DOB: May 02, 1976  Phone: [phone] (home) , Alternate phone contact: N/A  Shipping address: [address]  [facility] [place] [address]  Phone or address changes today?: No  All above HIPAA information verified.  Insurance changes? No    Completed refill and clinical call assessment today to schedule patient's medication shipment from the [facility] [phone]).      MEDICATION RECONCILIATION    Confirmed the medication and dosage are correct and have not changed: Yes, regimen is correct and unchanged.    Were there any changes to your medication(s) in the past month:  No, there are no changes reported at this time.    ADHERENCE    Is this medicine transplant or covered by Medicare Part B? Yes.    Cyclosporine Modified 25 mg   Quantity filled last month: 300   # of tablets left on hand: 12 DAYS LEFT      Did you miss any doses in the past 4 weeks? Yes.  Gina Hart reports missing 2 DOSES days of medication therapy in the last 4 weeks.  Gina Hart reports FORGOT as the cause of their non-adherance.  Adherence counseling provided? Yes, discussed the following ways to help with adherence: using a pillbox (pt says she used to but stopped and now may go back and do it again), alarm on phone.     SIDE EFFECT MANAGEMENT    Are you tolerating your medication?:  Gina Hart reports tolerating the medication.  Side effect management discussed: None      Therapy is appropriate and should be continued.    Evidence of clinical benefit: See Epic note from 01/02/18      FINANCIAL/SHIPPING    Delivery Scheduled: Yes, Expected medication delivery date: 01/23/18 via ups   Additional medications refilled: No additional medications/refills needed at this time.    The patient will receive an FSI print out for each medication shipped and additional FDA Medication Guides as required.  Patient education from [place] or Robet Leu may also be included in the shipment.    Gina Hart did not have any additional questions at this time.    Delivery address validated in FSI scheduling system: Yes, address listed above is correct.      We will follow up with patient monthly for standard refill processing and delivery.      Thank you,  Thad Ranger   [facility] Specialty Pharmacist

## 2018-01-22 MED FILL — CYCLOSPORINE MODIFIED/25MG MOD/CAPS: CYCLOSPORINE MODIFIED/25MG MOD/CAPS | 30 days supply | Qty: 300 | Fill #5

## 2018-01-25 NOTE — Unmapped (Signed)
Standing lab orders updated [place]. Verified local lab letter includes EBV PCR as well.

## 2018-01-25 NOTE — Unmapped (Signed)
Current Outpatient Medications on File Prior to Visit   Medication Sig   ??? acetaminophen (TYLENOL) 325 MG tablet Take 1-2 tablets (325-650 mg total) by mouth every four (4) hours as needed for pain.   ??? amLODIPine (NORVASC) 5 MG tablet Take 1 tablet (5 mg total) by mouth daily.   ??? [EXPIRED] amoxicillin (AMOXIL) 500 MG capsule Take 1 capsule (500 mg total) by mouth Three (3) times a day. for 10 days   ??? aspirin (ECOTRIN) 81 MG tablet Take 1 tablet (81 mg total) by mouth daily.   ??? cyclobenzaprine (FLEXERIL) 10 MG tablet Take 1 tablet (10 mg total) by mouth two (2) times a day as needed for muscle spasms. (Patient not taking: Reported on 10/02/2017)   ??? cyclobenzaprine (FLEXERIL) 10 MG tablet cyclobenzaprine 10 mg tablet   ??? cycloSPORINE modified (NEORAL) 25 MG capsule Take 5 capsules (125 mg total) by mouth Two (2) times a day. z94.0   ??? famotidine (PEPCID) 20 MG tablet Take 20 mg by mouth.   ??? [EXPIRED] fluconazole (DIFLUCAN) 150 MG tablet Take 1 tablet (150 mg total) by mouth once. for 1 dose   ??? fluticasone propionate (FLONASE) 50 mcg/actuation nasal spray 1 spray by Each Nare route daily.   ??? gabapentin (NEURONTIN) 100 MG capsule Take 2 capsules (200 mg total) by mouth Three (3) times a day. (Patient not taking: Reported on 01/02/2018)   ??? loratadine (CLARITIN) 10 mg tablet Take 10 mg by mouth daily as needed for allergies.   ??? metoprolol tartrate (LOPRESSOR) 100 MG tablet Take 1 tablet (100 mg total) by mouth Two (2) times a day.   ??? MYFORTIC 180 mg EC tablet Take 1 tablet (180 mg total) by mouth Two (2) times a day.   ??? predniSONE (DELTASONE) 5 MG tablet Take 1 tablet (5 mg total) by mouth daily.   ??? ranitidine (ZANTAC) 150 MG tablet Take 1 tablet (150 mg total) by mouth Two (2) times a day.   ??? sertraline (ZOLOFT) 100 MG tablet Take 1 tablet (100 mg total) by mouth daily.   ??? traMADol (ULTRAM) 50 mg tablet Take 50 mg by mouth every six (6) hours as needed for pain.   ??? traZODone (DESYREL) 50 MG tablet TAKE 1 TABLET BY MOUTH ONCE NIGHTLY     No current facility-administered medications on file prior to visit.

## 2018-01-29 LAB — HLA CLASS 1 ANTIBODY RESULT: Lab: POSITIVE

## 2018-01-29 LAB — FSAB CLASS 2 ANTIBODY SPECIFICITY: HLA CL2 AB RESULT: POSITIVE

## 2018-01-29 LAB — HLA CL2 AB COMMENT: Lab: 0

## 2018-01-29 LAB — FSAB CLASS 1 ANTIBODY SPECIFICITY

## 2018-01-30 NOTE — Unmapped (Signed)
[facility]) medication reconciliation is completed.

## 2018-01-31 LAB — HLA DS POST TRANSPLANT
ANTI-DONOR HLA-A #1 MFI: 89 MFI
ANTI-DONOR HLA-A #2 MFI: 29 MFI
ANTI-DONOR HLA-B #1 MFI: 30 MFI
ANTI-DONOR HLA-B #2 MFI: 444 MFI
ANTI-DONOR HLA-C #1 MFI: 0 MFI
ANTI-DONOR HLA-C #2 MFI: 0 MFI

## 2018-01-31 LAB — DONOR HLA-DR ANTIGEN #1

## 2018-02-15 MED ORDER — MYFORTIC 180 MG TABLET,DELAYED RELEASE
ORAL_TABLET | Freq: Two times a day (BID) | ORAL | 11 refills | 0.00000 days | Status: CP
Start: 2018-02-15 — End: 2018-08-26

## 2018-02-15 NOTE — Unmapped (Signed)
Pt request for RX Refill

## 2018-02-19 NOTE — Unmapped (Signed)
[facility] Refill Coordination Note  Specialty Medication(s): Cyclosporine 25mg     Gina Hart, DOB: August 09, 1975  Phone: [phone] (home) , Alternate phone contact: N/A  Phone or address changes today?: No  All above HIPAA information was verified with patient.  Shipping Address: [address]  [facility] [place] [address]   Insurance changes? No    Completed refill call assessment today to schedule patient's medication shipment from the [facility] [phone]).      Confirmed the medication and dosage are correct and have not changed: Yes, regimen is correct and unchanged.    Confirmed patient started or stopped the following medications in the past month:  No, there are no changes reported at this time.    Are you tolerating your medication?:  Gina Hart reports tolerating the medication.    ADHERENCE    (Below is required for Medicare Part B or Transplant patients only - per drug):   How many tablets were dispensed last month: 300  Patient currently has 90 remaining.    Did you miss any doses in the past 4 weeks? No missed doses reported.    FINANCIAL/SHIPPING    Delivery Scheduled: Yes, Expected medication delivery date: 02/27/2018     The patient will receive a drug information handout for each medication shipped and additional FDA Medication Guides as required.      Gina Hart did not have any additional questions at this time.    Delivery address validated in Epic.    We will follow up with patient monthly for standard refill processing and delivery.      Thank you,  Chelsea Pedretti  [name]   [facility] Specialty Pharmacist

## 2018-02-20 LAB — CBC W/ DIFFERENTIAL
BASOPHILS ABSOLUTE COUNT: 0 10*9/L — ABNORMAL LOW
BASOPHILS RELATIVE PERCENT: 0.7 %
HEMATOCRIT: 40.3 %
HEMOGLOBIN: 13.6 g/dL
LYMPHOCYTES ABSOLUTE COUNT: 0.8 10*9/L — ABNORMAL LOW
LYMPHOCYTES RELATIVE PERCENT: 19.3 % — ABNORMAL LOW
MEAN CORPUSCULAR HEMOGLOBIN CONC: 33.6 g/dL
MEAN CORPUSCULAR VOLUME: 91.8 fL
MONOCYTES ABSOLUTE COUNT: 0.5 10*9/L
MONOCYTES RELATIVE PERCENT: 11.1 %
NEUTROPHILS ABSOLUTE COUNT: 2.9 10*9/L
NEUTROPHILS RELATIVE PERCENT: 67.1 %
PLATELET COUNT: 226 10*9/L
RED BLOOD CELL COUNT: 4.4 10*12/L
RED CELL DISTRIBUTION WIDTH: 12.9 %
WBC ADJUSTED: 4.3 10*9/L

## 2018-02-20 LAB — BASIC METABOLIC PANEL
BLOOD UREA NITROGEN: 25 mg/dL — ABNORMAL HIGH
CALCIUM: 9.5 mg/dL
CHLORIDE: 105 mmol/L
CO2: 27.1 mmol/L
CREATININE: 0.9 mg/dL
EGFR MDRD AF AMER: 83 mL/min/{1.73_m2}
SODIUM: 138 mmol/L

## 2018-02-20 LAB — EGFR MDRD AF AMER: Lab: 83

## 2018-02-20 LAB — MAGNESIUM: Lab: 1.6 — ABNORMAL LOW

## 2018-02-20 LAB — NEUTROPHILS ABSOLUTE COUNT: Lab: 2.9

## 2018-02-20 LAB — PHOSPHORUS: Lab: 3.4

## 2018-02-21 LAB — CYCLOSPORINE, TROUGH: Lab: 151

## 2018-02-26 MED FILL — CYCLOSPORINE MODIFIED 25 MG CAPSULE: 30 days supply | Qty: 300 | Fill #0

## 2018-02-26 MED FILL — CYCLOSPORINE MODIFIED 25 MG CAPSULE: 30 days supply | Qty: 300 | Fill #0 | Status: AC

## 2018-03-20 NOTE — Unmapped (Signed)
[facility] Refill Coordination Note    Specialty Medication(s) to be Shipped:   Transplant: cyclosporine 25mg   Other medication(s) to be shipped: Ranitidine 150mg   **Patient denied refills on Myfortic, Prednisone and Amlodipine due to pt getting it from local pharmacy**     Gina Hart, DOB: 11-07-1975  Phone: [phone] (home)   Shipping Address: [address]  [facility] [place] [address]    All above HIPAA information was verified with patient.     Completed refill call assessment today to schedule patient's medication shipment from the [facility] [phone]).       Specialty medication(s) and dose(s) confirmed: Regimen is correct and unchanged.   Changes to medications: Gina Hart reports no changes reported at this time.  Changes to insurance: No  Questions for the pharmacist: No    The patient will receive a drug information handout for each medication shipped and additional FDA Medication Guides as required.      DISEASE/MEDICATION-SPECIFIC INFORMATION        N/A    ADHERENCE     Patient reported missing 1 dose due to her drinking alcohol    MEDICARE PART B DOCUMENTATION     Cyclosporine modified 25mg : Patient has 7 days worth of capsules on hand.    SHIPPING     Shipping address confirmed in Epic.     Delivery Scheduled: Yes, Expected medication delivery date: 03/27/2018 via UPS or courier.     Gina Hart   [facility] Specialty Technician

## 2018-03-20 NOTE — Unmapped (Signed)
Per refill note today, patient missed one dose of cyclosporine because she was drinking alcohol and wasn't sure if it was safe.  Per Lexicomp, no interactions noted. I counseled patient that she should never skip her medication although I don't recommend excessive drinking.  Advise patient to reach out to coordinator if she has further questions.  DCR

## 2018-03-21 MED ORDER — RANITIDINE 150 MG TABLET
ORAL_TABLET | ORAL | 3 refills | 0 days | Status: CP
Start: 2018-03-21 — End: 2018-10-14
  Filled 2018-03-26: qty 180, 90d supply, fill #0

## 2018-03-26 MED FILL — CYCLOSPORINE MODIFIED 25 MG CAPSULE: 30 days supply | Qty: 300 | Fill #1

## 2018-03-26 MED FILL — CYCLOSPORINE MODIFIED 25 MG CAPSULE: 30 days supply | Qty: 300 | Fill #1 | Status: AC

## 2018-03-26 MED FILL — RANITIDINE 150 MG TABLET: 90 days supply | Qty: 180 | Fill #0 | Status: AC

## 2018-04-10 NOTE — Unmapped (Deleted)
{** REMINDER - THIS NOTE IS NOT A FINAL MEDICAL RECORD UNTIL IT IS SIGNED.  UNTIL THEN, THE CONTENT BELOW MAY REFLECT INFORMATION FROM A DOCUMENTATION TEMPLATE, NOT THE ACTUAL PATIENT VISIT. **}    Transplant Nephrology Clinic Visit    History of Present Illness  Gina Hart is a 42 y.o. female who is s/p deceased kidney transplant on 01-17-17. She has experienced no episodes of rejection with normal urinary protein and baseline creatinine <1.0 mg/dL.     Patient presents today ***    Patient presents today complaining of a one week history of productive cough and sore throat. She denies fevers, shortness of breath, edema, or chest pain. She is currently treating nasal congestion with Flonase nasal spray and loratidine. Patient also complains that her left ankle gives out and led to a fall last week.  She notes only occasional mild headaches.    Transplant History:  1. 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  2. S/P kidney transplant at [facility] Jan 08, 2107. Deceased donor with KDPI 19%. CMV D+/R-; EBV D+/R+  3. Immunosuppression = campath induction followed by tacrolimus and myfortic maintenance therapy, steroid free regimen.   4. Tacrolimus changed to cyclosporine 07/31/2017 due to neurotoxicity with memory deficits and headaches as primary manifestation.     Past Medical History:  1. ESRD secondary to FSGS.   2. Hypertension  3. Scoliosis  S/P kidney transplant as stated above    Review of Systems    All other systems are reviewed and are negative.    Medications  Current Outpatient Medications   Medication Sig Dispense Refill   ??? acetaminophen (TYLENOL) 325 MG tablet Take 1-2 tablets (325-650 mg total) by mouth every four (4) hours as needed for pain. 100 tablet 0   ??? amLODIPine (NORVASC) 5 MG tablet TAKE 1 TABLET (5 MG) BY MOUTH DAILY 30 tablet 11   ??? aspirin (ECOTRIN) 81 MG tablet TAKE 1 TABLET (81 MG) BY MOUTH DAILY 30 tablet 0   ??? cyclobenzaprine (FLEXERIL) 10 MG tablet Take 1 tablet (10 mg total) by mouth two (2) times a day as needed for muscle spasms. (Patient not taking: Reported on 10/02/2017) 45 tablet 0   ??? cycloSPORINE modified (NEORAL) 25 MG capsule TAKE 5 CAPSULES (125MG ) BY MOUTH TWICE DAILY 300 each 11   ??? famotidine (PEPCID) 20 MG tablet Take 20 mg by mouth.     ??? fluticasone propionate (FLONASE) 50 mcg/actuation nasal spray 1 spray by Each Nare route daily. 16 g 4   ??? gabapentin (NEURONTIN) 100 MG capsule Take 2 capsules (200 mg total) by mouth Three (3) times a day. (Patient not taking: Reported on 01/02/2018) 180 capsule 11   ??? loratadine (CLARITIN) 10 mg tablet Take 10 mg by mouth daily as needed for allergies.     ??? metoprolol tartrate (LOPRESSOR) 100 MG tablet Take 1 tablet (100 mg total) by mouth Two (2) times a day. 180 tablet 3   ??? MYFORTIC 180 mg EC tablet Take 1 tablet (180 mg total) by mouth Two (2) times a day. 60 tablet 11   ??? predniSONE (DELTASONE) 5 MG tablet TAKE 1 TABLET (5 MG) BY MOUTH DAILY 30 tablet 11   ??? ranitidine (ZANTAC) 150 MG tablet TAKE 1 TABLET BY MOUTH TWICE DAILY 180 tablet 3   ??? sertraline (ZOLOFT) 100 MG tablet Take 1 tablet (100 mg total) by mouth daily. 90 tablet 3   ??? traMADol (ULTRAM) 50 mg tablet Take 50  mg by mouth every six (6) hours as needed for pain.     ??? traZODone (DESYREL) 50 MG tablet TAKE 1 TABLET BY MOUTH ONCE NIGHTLY 90 tablet 11     No current facility-administered medications for this visit.        Physical Exam    There were no vitals taken for this visit.  General: Well-appearing, in no acute distress  HEENT: Anicteric sclera, OP clear  CV: RRR, no m/r/g, no edema  Lungs: CTAB, normal wob  Abd: Soft, non-tender, non-distended  MSK: No visible joint swelling  Extremities: No edema.  Skin: No visible lesions or rashes  Psych: Alert, engaged, appropriate mood and affect     Laboratory Results  No results found for this or any previous visit (from the past 170 hour(s)).    Assessment and Plan  Gina Hart is a 42 y.o. female who is s/p deceased donor kidney transplant on 01/11/17. Active medical issues include:    1. Status post deceased donor kidney transplant on 01/11/17 with no history of rejection or evidence of FSGS recurrence. Renal function is stable with serum creatinine ***0.83 mg/dL today. UP/C is again normal, DSA screen was negative on 07/31/2017.    2. Immunosuppression. Currently on cyclosporine 125 mg BID, myfortic 180 BID, and prednisone 5 mg daily. Cyclosporine level today is ***185. I will continue the current regimen.    3. Hypertension. On amlodipine 10 mg daily and metoprolol tartrate 100 mg BID. BP is ***120/78 today. Will continue current antihypertensives.    4. Productive cough, sore throat and nasal congestion. Will prescribe patient amoxicillin. She was given a single dose of a150 mg diflucan because she often develops yeast infections after antibiotics.    5. Headaches, improved off tacrolimus. No further imaging of the brain is planned.    6. Spinal stenosis, cervical. She is following with the [facility] for management.      7. Health Maintenance. Will plan for pneumococcal and shingrix vaccines at her next clinic visit if  Shingrix is available.     8. Follow up. RTC 3 months.

## 2018-04-10 NOTE — Unmapped (Deleted)
{** REMINDER - THIS NOTE IS NOT A FINAL MEDICAL RECORD UNTIL IT IS SIGNED.  UNTIL THEN, THE CONTENT BELOW MAY REFLECT INFORMATION FROM A DOCUMENTATION TEMPLATE, NOT THE ACTUAL PATIENT VISIT. **}    Transplant Nephrology Clinic Visit    History of Present Illness  Gina Hart is a 42 y.o. female who is s/p deceased kidney transplant on 02-05-17. She has experienced no episodes of rejection with normal urinary protein and baseline creatinine <1.0 mg/dL.     She presents today with***    Patient presents today complaining of a one week history of productive cough and sore throat. She denies fevers, shortness of breath, edema, or chest pain. She is currently treating nasal congestion with Flonase nasal spray and loratidine. Patient also complains that her left ankle gives out and led to a fall last week.  She notes only occasional mild headaches.       Transplant History:  1. 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  2. S/P kidney transplant at [facility] 01/27/2107. Deceased donor with KDPI 19%. CMV D+/R-; EBV D+/R+  3. Immunosuppression = campath induction followed by tacrolimus and myfortic maintenance therapy, steroid free regimen.   4. Tacrolimus changed to cyclosporine 07/31/2017 due to neurotoxicity with memory deficits and headaches as primary manifestation.     Past Medical History:  1. ESRD secondary to FSGS.   2. Hypertension  3. Scoliosis  S/P kidney transplant as stated above    Review of Systems    All other systems are reviewed and are negative.    Medications  Current Outpatient Medications   Medication Sig Dispense Refill   ??? acetaminophen (TYLENOL) 325 MG tablet Take 1-2 tablets (325-650 mg total) by mouth every four (4) hours as needed for pain. 100 tablet 0   ??? amLODIPine (NORVASC) 5 MG tablet TAKE 1 TABLET (5 MG) BY MOUTH DAILY 30 tablet 11   ??? aspirin (ECOTRIN) 81 MG tablet TAKE 1 TABLET (81 MG) BY MOUTH DAILY 30 tablet 0   ??? cyclobenzaprine (FLEXERIL) 10 MG tablet Take 1 tablet (10 mg total) by mouth two (2) times a day as needed for muscle spasms. (Patient not taking: Reported on 10/02/2017) 45 tablet 0   ??? cycloSPORINE modified (NEORAL) 25 MG capsule TAKE 5 CAPSULES (125MG ) BY MOUTH TWICE DAILY 300 each 11   ??? famotidine (PEPCID) 20 MG tablet Take 20 mg by mouth.     ??? fluticasone propionate (FLONASE) 50 mcg/actuation nasal spray 1 spray by Each Nare route daily. 16 g 4   ??? gabapentin (NEURONTIN) 100 MG capsule Take 2 capsules (200 mg total) by mouth Three (3) times a day. (Patient not taking: Reported on 01/02/2018) 180 capsule 11   ??? loratadine (CLARITIN) 10 mg tablet Take 10 mg by mouth daily as needed for allergies.     ??? metoprolol tartrate (LOPRESSOR) 100 MG tablet Take 1 tablet (100 mg total) by mouth Two (2) times a day. 180 tablet 3   ??? MYFORTIC 180 mg EC tablet Take 1 tablet (180 mg total) by mouth Two (2) times a day. 60 tablet 11   ??? predniSONE (DELTASONE) 5 MG tablet TAKE 1 TABLET (5 MG) BY MOUTH DAILY 30 tablet 11   ??? ranitidine (ZANTAC) 150 MG tablet TAKE 1 TABLET BY MOUTH TWICE DAILY 180 tablet 3   ??? sertraline (ZOLOFT) 100 MG tablet Take 1 tablet (100 mg total) by mouth daily. 90 tablet 3   ??? traMADol (ULTRAM) 50 mg  tablet Take 50 mg by mouth every six (6) hours as needed for pain.     ??? traZODone (DESYREL) 50 MG tablet TAKE 1 TABLET BY MOUTH ONCE NIGHTLY 90 tablet 11     No current facility-administered medications for this visit.        Physical Exam    There were no vitals taken for this visit.  General: well-appearing, in no acute distress  HEENT: anicteric sclera, OP clear  CV: RRR, no m/r/g, no edema  Lungs: CTAB, normal wob  Abd: soft, non-tender, non-distended  MSK: no visible joint swelling  Extremities: no edema.  Skin: no visible lesions or rashes  Psych: alert, engaged, appropriate mood and affect     Laboratory Results  No results found for this or any previous visit (from the past 170 hour(s)).    Assessment and Plan  Gina Hart is a 42 y.o. female who is s/p deceased donor kidney transplant on 01/11/17. Active medical issues include:    1. Status post deceased donor kidney transplant on 01/11/17 with no history of rejection or evidence of FSGS recurrence. Renal function is stable with serum creatinine 0.83*** mg/dL today. UP/C is again normal,  DSA screen was negative on 07/31/2017.      2. Immunosuppression. Currently on cyclosporine 125 mg BID, myfortic 180 BID, and prednisone 5 mg daily. Cyclosporine level today is 185.  I will continue the current regimen.     3. Hypertension. On amlodipine 10mg  daily and metoprolol tartrate 100mg  bid. BP is 120/78 today.  Will continue current antihypertensives.      4. Productive cough, sore throat and nasal congestion. Will prescribe patient amoxicillin. She was given a single dose of a150 mg diflucan because she often develops yeast infections after antibiotics.     5. Headaches, improved off tacrolimus. No further imaging of the brain is planned.    6. Spinal stenosis, cervical. She is following with the Pain Clinic for management.      7. Health Maintenance. Will plan for pneumococcal and shingrix vaccines at her next clinic visit if  Shingrix is available.     8. Follow up. RTC 3 months.     Scribe's Attestation: Jackey Loge, MD obtained and performed the history, physical exam and medical decision making elements that were entered into the chart.  Signed by Delaney Meigs, Scribe, on *** .    {*** NOTE TO PROVIDER: PLEASE ADD ATTESTATION NOTING YOU AGREE WITH SCRIBE DOCUMENTATION}

## 2018-04-19 NOTE — Unmapped (Signed)
[facility] Refill Coordination Note    Specialty Medication(s) to be Shipped:   Transplant: cyclosporine 25mg        Gina Hart, DOB: March 25, 1976  Phone: [phone] (home)       All above HIPAA information was verified with patient.     Completed refill call assessment today to schedule patient's medication shipment from the [facility] [phone]).       Specialty medication(s) and dose(s) confirmed: Regimen is correct and unchanged.   Changes to medications: Leliana reports no changes reported at this time.  Changes to insurance: No  Questions for the pharmacist: No    The patient will receive a drug information handout for each medication shipped and additional FDA Medication Guides as required.      DISEASE/MEDICATION-SPECIFIC INFORMATION        N/A    ADHERENCE     Medication Adherence        Adherence tools used:  patient uses a pill box to manage medications                      MEDICARE PART B DOCUMENTATION     Cyclosporine modified 25mg : Patient has 7 days worth capsules on hand.    SHIPPING     Shipping address confirmed in Epic.     Delivery Scheduled: Yes, Expected medication delivery date: 04/25/18 via UPS or courier.     Medication will be delivered via UPS to the home address in Epic WAM.    Jorje Guild   [facility] Specialty Technician

## 2018-04-25 MED FILL — CYCLOSPORINE MODIFIED 25 MG CAPSULE: 30 days supply | Qty: 300 | Fill #2

## 2018-04-25 MED FILL — CYCLOSPORINE MODIFIED 25 MG CAPSULE: 30 days supply | Qty: 300 | Fill #2 | Status: AC

## 2018-05-07 NOTE — Unmapped (Signed)
Pt. Called and cancelled clinic appointment b/c of transportation issues. She said she will get labs done locally.

## 2018-05-16 NOTE — Unmapped (Signed)
[facility] Refill Coordination Note    Specialty Medication(s) to be Shipped:   Transplant: cyclosporine 25mg     Other medication(s) to be shipped: none     Gina Hart, DOB: 05-19-1976  Phone: [phone] (home)       All above HIPAA information was verified with patient.     Completed refill call assessment today to schedule patient's medication shipment from the [facility] [phone]).       Specialty medication(s) and dose(s) confirmed: Regimen is correct and unchanged.   Changes to medications: Cele reports no changes reported at this time.  Changes to insurance: No  Questions for the pharmacist: No    The patient will receive a drug information handout for each medication shipped and additional FDA Medication Guides as required.      DISEASE/MEDICATION-SPECIFIC INFORMATION        N/A    ADHERENCE     Medication Adherence    Patient reported X missed doses in the last month:  0      Adherence tools used:  patient uses a pill box to manage medications                      MEDICARE PART B DOCUMENTATION     Cyclosporine modified 25mg : Patient has 9 days worth of capsules on hand.    SHIPPING     Shipping address confirmed in Epic.     Delivery Scheduled: Yes, Expected medication delivery date: 05/21/18 via UPS or courier.     Medication will be delivered via UPS to the home address in Epic WAM.    [place] A Hisae Decoursey   [facility] Specialty Technician

## 2018-05-17 LAB — CBC W/ DIFFERENTIAL
BASOPHILS ABSOLUTE COUNT: 0 10*9/L — ABNORMAL LOW
BASOPHILS RELATIVE PERCENT: 0.5 % — ABNORMAL LOW
EOSINOPHILS ABSOLUTE COUNT: 0.1 10*9/L
EOSINOPHILS RELATIVE PERCENT: 1.7 %
HEMATOCRIT: 40.9 %
HEMOGLOBIN: 13 g/dL
LYMPHOCYTES ABSOLUTE COUNT: 0.8 10*9/L
LYMPHOCYTES RELATIVE PERCENT: 16.4 % — ABNORMAL LOW
MEAN CORPUSCULAR HEMOGLOBIN: 31 pg
MEAN CORPUSCULAR VOLUME: 92.2 fL
MEAN PLATELET VOLUME: 8.2 fL
MONOCYTES ABSOLUTE COUNT: 0.5 10*9/L
MONOCYTES RELATIVE PERCENT: 9.5 %
NEUTROPHILS ABSOLUTE COUNT: 3.6 10*9/L
NEUTROPHILS RELATIVE PERCENT: 71.9 %
PLATELET COUNT: 234 10*9/L
RED BLOOD CELL COUNT: 4.4 10*12/L
WBC ADJUSTED: 5 10*9/L

## 2018-05-17 LAB — GLUCOSE RANDOM: Lab: 89

## 2018-05-17 LAB — BASIC METABOLIC PANEL
BLOOD UREA NITROGEN: 19 mg/dL — ABNORMAL HIGH
CHLORIDE: 104 mmol/L
CO2: 28.3 mmol/L
CREATININE: 0.9 mg/dL
EGFR MDRD AF AMER: 83 mL/min/{1.73_m2}
GLUCOSE RANDOM: 89 mg/dL
POTASSIUM: 4.4 mmol/L
SODIUM: 137 mmol/L

## 2018-05-17 LAB — PHOSPHORUS: Lab: 3.2

## 2018-05-17 LAB — PLATELET COUNT: Lab: 234

## 2018-05-17 LAB — MAGNESIUM: Lab: 1.7

## 2018-05-20 LAB — CYCLOSPORINE, TROUGH: Lab: 167

## 2018-05-20 MED FILL — CYCLOSPORINE MODIFIED 25 MG CAPSULE: 30 days supply | Qty: 300 | Fill #3

## 2018-05-20 MED FILL — CYCLOSPORINE MODIFIED 25 MG CAPSULE: 30 days supply | Qty: 300 | Fill #3 | Status: AC

## 2018-05-21 NOTE — Unmapped (Deleted)
{** REMINDER - THIS NOTE IS NOT A FINAL MEDICAL RECORD UNTIL IT IS SIGNED.  UNTIL THEN, THE CONTENT BELOW MAY REFLECT INFORMATION FROM A DOCUMENTATION TEMPLATE, NOT THE ACTUAL PATIENT VISIT. **}    Transplant Nephrology Clinic Visit    History of Present Illness  Gina Hart is a 42 y.o. female who is s/p deceased kidney transplant on 02/03/2017. She has experienced no episodes of rejection with normal urinary protein and baseline creatinine <1.0 mg/dL.     Patient presents today complaining of a one week history of productive cough and sore throat. She denies fevers, shortness of breath, edema, or chest pain. She is currently treating nasal congestion with Flonase nasal spray and loratidine. Patient also complains that her left ankle gives out and led to a fall last week.  She notes only occasional mild headaches.       Transplant History:  1. 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  2. S/P kidney transplant at [facility] 2107/01/25. Deceased donor with KDPI 19%. CMV D+/R-; EBV D+/R+  3. Immunosuppression = campath induction followed by tacrolimus and myfortic maintenance therapy, steroid free regimen.   4. Tacrolimus changed to cyclosporine 07/31/2017 due to neurotoxicity with memory deficits and headaches as primary manifestation.     Past Medical History:  1. ESRD secondary to FSGS.   2. Hypertension  3. Scoliosis  S/P kidney transplant as stated above    Review of Systems    All other systems are reviewed and are negative.    Medications  Current Outpatient Medications   Medication Sig Dispense Refill   ??? acetaminophen (TYLENOL) 325 MG tablet Take 1-2 tablets (325-650 mg total) by mouth every four (4) hours as needed for pain. 100 tablet 0   ??? amLODIPine (NORVASC) 5 MG tablet TAKE 1 TABLET (5 MG) BY MOUTH DAILY 30 tablet 11   ??? aspirin (ECOTRIN) 81 MG tablet TAKE 1 TABLET (81 MG) BY MOUTH DAILY 30 tablet 0   ??? cyclobenzaprine (FLEXERIL) 10 MG tablet Take 1 tablet (10 mg total) by mouth two (2) times a day as needed for muscle spasms. (Patient not taking: Reported on 10/02/2017) 45 tablet 0   ??? cycloSPORINE modified (NEORAL) 25 MG capsule TAKE 5 CAPSULES (125MG ) BY MOUTH TWICE DAILY 300 each 11   ??? famotidine (PEPCID) 20 MG tablet Take 20 mg by mouth.     ??? fluticasone propionate (FLONASE) 50 mcg/actuation nasal spray 1 spray by Each Nare route daily. 16 g 4   ??? gabapentin (NEURONTIN) 100 MG capsule Take 2 capsules (200 mg total) by mouth Three (3) times a day. (Patient not taking: Reported on 01/02/2018) 180 capsule 11   ??? loratadine (CLARITIN) 10 mg tablet Take 10 mg by mouth daily as needed for allergies.     ??? metoprolol tartrate (LOPRESSOR) 100 MG tablet Take 1 tablet (100 mg total) by mouth Two (2) times a day. 180 tablet 3   ??? MYFORTIC 180 mg EC tablet Take 1 tablet (180 mg total) by mouth Two (2) times a day. 60 tablet 11   ??? predniSONE (DELTASONE) 5 MG tablet TAKE 1 TABLET (5 MG) BY MOUTH DAILY 30 tablet 11   ??? ranitidine (ZANTAC) 150 MG tablet TAKE 1 TABLET BY MOUTH TWICE DAILY 180 tablet 3   ??? sertraline (ZOLOFT) 100 MG tablet Take 1 tablet (100 mg total) by mouth daily. 90 tablet 3   ??? traMADol (ULTRAM) 50 mg tablet Take 50 mg by mouth every  six (6) hours as needed for pain.     ??? traZODone (DESYREL) 50 MG tablet TAKE 1 TABLET BY MOUTH ONCE NIGHTLY 90 tablet 11     No current facility-administered medications for this visit.        Physical Exam    There were no vitals taken for this visit.  General: well-appearing, in no acute distress  HEENT: anicteric sclera, OP clear  CV: RRR, no m/r/g, no edema  Lungs: CTAB, normal wob  Abd: soft, non-tender, non-distended  MSK: no visible joint swelling  Extremities: no edema.  Skin: no visible lesions or rashes  Psych: alert, engaged, appropriate mood and affect     Laboratory Results  Recent Results (from the past 170 hour(s))   Magnesium Level    Collection Time: 05/16/18  8:27 AM   Result Value Ref Range    Magnesium 1.7 mg/dL   Phosphorus Level    Collection Time: 05/16/18  8:27 AM   Result Value Ref Range    Phosphorus 3.2 mg/dL   Basic Metabolic Panel    Collection Time: 05/16/18  8:27 AM   Result Value Ref Range    Sodium 137 mmol/L    Potassium 4.4 mmol/L    Chloride 104 mmol/L    CO2 28.3 mmol/L    BUN 19 (H) mg/dL    Creatinine 1.61 mg/dL    Glucose 89 mg/dL    Calcium 9.8 mg/dL    EGFR MDRD Non Af Amer      EGFR MDRD Af Amer 83 mL/min/1.79m2   CBC w/ Differential    Collection Time: 05/16/18  8:27 AM   Result Value Ref Range    Results Verified by Slide Scan      WBC 5.0 10*9/L    WBC      RBC 4.40 10*12/L    HGB 13.0 g/dL    HCT 09.6 %    MCV 04.5 fL    MCH 31.0 pg    MCHC 33.6 g/dL    RDW      MPV 8.2 fL    Platelet 234 10*9/L    nRBC      Neutrophils % 71.9 %    Lymphocytes % 16.4 (L) %    Monocytes % 9.5 %    Eosinophils % 1.7 %    Basophils % 0.5 (L) %    Absolute Neutrophils 3.6 10*9/L    Absolute Lymphocytes 0.8 10*9/L    Absolute Monocytes 0.5 10*9/L    Absolute Eosinophils 0.1 10*9/L    Absolute Basophils 0.0 (L) 10*9/L    Microcytosis      Macrocytosis      Anisocytosis      Hyperchromasia      Hypochromasia     Cyclosporine, Trough    Collection Time: 05/16/18  8:27 AM   Result Value Ref Range    Cyclosporine, Trough 167 100 - 400 ng/mL       Assessment and Plan  [name] is a 42 y.o. female who is s/p deceased donor kidney transplant on 01/11/17. Active medical issues include:    1. Status post deceased donor kidney transplant on 01/11/17 with no history of rejection or evidence of FSGS recurrence. Renal function is stable with serum creatinine 0.83 mg/dL today. UP/C is again normal,  DSA screen was negative on 07/31/2017.      2. Immunosuppression. Currently on cyclosporine 125 mg BID, myfortic 180 BID, and prednisone 5 mg daily. Cyclosporine level today is 185.  I  will continue the current regimen.     3. Hypertension. On amlodipine 10mg  daily and metoprolol tartrate 100mg  bid. BP is 120/78 today.  Will continue current antihypertensives.      4. Productive cough, sore throat and nasal congestion. Will prescribe patient amoxicillin. She was given a single dose of a150 mg diflucan because she often develops yeast infections after antibiotics.     5. Headaches, improved off tacrolimus. No further imaging of the brain is planned.    6. Spinal stenosis, cervical. She is following with the Pain Clinic for management.      7. Health Maintenance. Will plan for pneumococcal and shingrix vaccines at her next clinic visit if  Shingrix is available.     8. Follow up. RTC 3 months.     Scribe's Attestation: Jackey Loge, MD obtained and performed the history, physical exam and medical decision making elements that were entered into the chart.  Signed by Delaney Meigs, Scribe, on *** .    {*** NOTE TO PROVIDER: PLEASE ADD ATTESTATION NOTING YOU AGREE WITH SCRIBE DOCUMENTATION}

## 2018-06-10 NOTE — Unmapped (Signed)
[facility] Refill and Clinical Coordination Note  Medication(s): cyclosporine - not filling today  Pt states she gets prednisone, amlodipine, and all other meds from her local pharmacy not [facility].    Gina Hart, DOB: 09-03-1975  Phone: [phone] (home) , Alternate phone contact: N/A  Shipping address: [address]  [facility] [place] [address]  Phone or address changes today?: No  All above HIPAA information verified.  Insurance changes? No    Completed refill and clinical call assessment today to schedule patient's medication shipment from the [facility] [phone]).      MEDICATION RECONCILIATION    Confirmed the medication and dosage are correct and have not changed: Yes, regimen is correct and unchanged.    Were there any changes to your medication(s) in the past month:  No, there are no changes reported at this time.    ADHERENCE    Is this medicine transplant or covered by Medicare Part B? Yes.    Cyclosporine modified 25mg : Patient has 2.5 weeks worth of capsules on hand.    Did you miss any doses in the past 4 weeks? No missed doses reported.  Adherence counseling provided? Not needed     SIDE EFFECT MANAGEMENT    Are you tolerating your medication?:  Gina Hart reports tolerating the medication.  Side effect management discussed: None      Therapy is appropriate and should be continued.    Evidence of clinical benefit: Do you feel that that the medication is helping? Yes      FINANCIAL/SHIPPING    Delivery Scheduled: Patient declined refill at this time due to still has over 2 weeks left, requests call back in 1 week.     Medication will be delivered via n/a to the n/a address in [facility]    Additional medications refilled: No additional medications/refills needed at this time.    The patient will receive a drug information handout for each medication shipped and additional FDA Medication Guides as required.      Gina Hart had the following additional questions or need for a follow-up phone call from the Clinical Pharmacy Team: due to ranitidine recalls patient would like alternative med - will message clinic    Delivery address confirmed in Epic.     We will follow up with patient monthly for standard refill processing and delivery.      Thank you,  Thad Ranger   [facility] Specialty Pharmacist

## 2018-06-17 MED FILL — RANITIDINE 150 MG TABLET: 90 days supply | Qty: 180 | Fill #1 | Status: AC

## 2018-06-17 MED FILL — CYCLOSPORINE MODIFIED 25 MG CAPSULE: 30 days supply | Qty: 300 | Fill #4

## 2018-06-17 MED FILL — RANITIDINE 150 MG TABLET: ORAL | 90 days supply | Qty: 180 | Fill #1

## 2018-06-17 MED FILL — CYCLOSPORINE MODIFIED 25 MG CAPSULE: 30 days supply | Qty: 300 | Fill #4 | Status: AC

## 2018-06-17 NOTE — Unmapped (Signed)
[facility] Pharm[facility] Refill Coordination Note  Specialty Medication(s): Cyclosporine (Neoral) 25mg  capsules  Additional Medications shipped: Ranitidine 150mg  tablets    Gina Hart, DOB: 24-Jul-1975  Phone: [phone] (home) , Alternate phone contact: N/A  Phone or address changes today?: No  All above HIPAA information was verified with patient.  Shipping Address: [address]  [place] [address]   Insurance changes? No    Completed refill call assessment today to schedule patient's medication shipment from the [facility] [phone]).      Confirmed the medication and dosage are correct and have not changed: Yes, regimen is correct and unchanged.    Confirmed patient started or stopped the following medications in the past month:  No, there are no changes reported at this time.    Are you tolerating your medication?:  Gina Hart reports side effects of increased confusion (when asked, patient stated she started forgetting minor things and is now more confused - same issue she had with Prograf, messaged provider and informed patient to call office).    ADHERENCE    Is this medicine transplant or covered by Medicare Part B? Yes.    Cyclosporine (Neoral) 25mg  capsules: patient stated she has 9 days remaining on hand    Did you miss any doses in the past 4 weeks? patient only recalls one missed dose due to forgetting if she actually took the dose or not (see noe above)    FINANCIAL/SHIPPING    Delivery Scheduled: Yes, Expected medication delivery date: 06/18/18     Medication will be delivered via UPS to the home address in [facility].    The patient will receive a drug information handout for each medication shipped and additional FDA Medication Guides as required.     Ericah did not have any additional questions at this time.    Delivery address confirmed in Epic.    We will follow up with patient monthly for standard refill processing and delivery.      Thank you,  Mervyn Gay   [facility] Specialty Pharmacist

## 2018-06-21 ENCOUNTER — Encounter: Admit: 2018-06-21 | Discharge: 2018-06-21 | Payer: MEDICARE

## 2018-06-21 ENCOUNTER — Ambulatory Visit: Admit: 2018-06-21 | Discharge: 2018-06-21 | Payer: MEDICARE | Attending: Nephrology | Primary: Nephrology

## 2018-06-21 ENCOUNTER — Ambulatory Visit: Admit: 2018-06-21 | Discharge: 2018-06-21 | Payer: MEDICARE

## 2018-06-21 DIAGNOSIS — Z7289 Other problems related to lifestyle: Secondary | ICD-10-CM

## 2018-06-21 DIAGNOSIS — R799 Abnormal finding of blood chemistry, unspecified: Secondary | ICD-10-CM

## 2018-06-21 DIAGNOSIS — Z940 Kidney transplant status: Principal | ICD-10-CM

## 2018-06-21 DIAGNOSIS — Z79899 Other long term (current) drug therapy: Secondary | ICD-10-CM

## 2018-06-21 DIAGNOSIS — M25572 Pain in left ankle and joints of left foot: Secondary | ICD-10-CM

## 2018-06-21 DIAGNOSIS — Z1159 Encounter for screening for other viral diseases: Secondary | ICD-10-CM

## 2018-06-21 DIAGNOSIS — Z48298 Encounter for aftercare following other organ transplant: Secondary | ICD-10-CM

## 2018-06-21 DIAGNOSIS — D899 Disorder involving the immune mechanism, unspecified: Secondary | ICD-10-CM

## 2018-06-21 DIAGNOSIS — G8929 Other chronic pain: Secondary | ICD-10-CM

## 2018-06-21 DIAGNOSIS — R413 Other amnesia: Secondary | ICD-10-CM

## 2018-06-21 LAB — URINALYSIS
BACTERIA: NONE SEEN /HPF
BILIRUBIN UA: NEGATIVE
BLOOD UA: NEGATIVE
GLUCOSE UA: NEGATIVE
KETONES UA: NEGATIVE
LEUKOCYTE ESTERASE UA: NEGATIVE
NITRITE UA: NEGATIVE
PH UA: 6.5 (ref 5.0–9.0)
PROTEIN UA: NEGATIVE
RBC UA: <1 /HPF (ref <=4)
SPECIFIC GRAVITY UA: 1.019 (ref 1.003–1.030)
SQUAMOUS EPITHELIAL: <1 /HPF (ref 0–5)
WBC UA: 1 /HPF (ref 0–5)

## 2018-06-21 LAB — PROTEIN URINE: Protein:MCnc:Pt:Urine:Qn:: 4.4

## 2018-06-21 LAB — PROTEIN / CREATININE RATIO, URINE
PROTEIN URINE: 4.4 mg/dL
PROTEIN/CREAT RATIO, URINE: 0.065

## 2018-06-21 LAB — NITRITE UA: Lab: NEGATIVE

## 2018-06-21 NOTE — Unmapped (Signed)
MRI of brain order per Dr. Margaretmary Bayley d/t confusion and memory loss.

## 2018-06-21 NOTE — Unmapped (Signed)
Transplant Coordinator, Clinic Visit   L ankle, crunching feeling intermittently with 8/10 pain feels like the ankle is going to give out;  Lower back 10/10 pain after standing up for too long, confusion/memory loss recently similar to confusion on Prograf, pt states she is feeling down during the holidays, doesn't want to go out.   Assessment  BP: 126/82  Headache: stress headaches, 2x week  Hand tremors: mild  Numbness/tingling: no  Shortness of breath: yes with activity  Chest pain or pressure: no  Palpitations: yes  Nausea/vomiting: no  Diarrhea/constipation: no  UTI symptoms: no  Swelling: R leg anterior calf    Immunosuppressant last taken: no labs today, will get labs on Monday    Pt will also get annual RUS and CXR.     Immunization status: received flu shot, chart updated    Per Dr. Margaretmary Bayley: Brain MRI ordered and will be scheduled r/t confusion and memory loss, pt will see orthopedics locally for L ankle pain and R calf swelling.     Functional Score: 90     Able to carry on normal activity;  Minor signs or symptoms of disease.  Employment status is: works part time    I spent a total of 10 minutes with Gina Hart reviewing medications and symptoms.

## 2018-06-21 NOTE — Unmapped (Signed)
Transplant Nephrology Clinic Visit    History of Present Illness  Gina Hart is a 43 y.o. female who is s/p deceased kidney transplant on 2017-02-08. She has experienced no episodes of rejection with normal urinary protein and baseline creatinine <1.0 mg/dL.     Patient presents today c/o having some memory issues where she will not remember where they are going and will sometimes be unable to say what she is thinking.  She denies fevers, shortness of breath, edema, or chest pain. Patient also complains that her left ankle gives out and and sometimes makes a crunching sound.  She complains that her right shin looks different and a bit bigger than her left.   She notes only occasional mild headaches.    Patient denies lightheadedness, dizziness, fever, chills, tremors, sob, chest pain, abdominal pain, n/v/d, edema, dysuria.       Transplant History:  1. 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  2. S/P kidney transplant at [facility] January 30, 2107. Deceased donor with KDPI 19%. CMV D+/R-; EBV D+/R+  3. Immunosuppression = campath induction followed by tacrolimus and myfortic maintenance therapy, steroid free regimen.   4. Tacrolimus changed to cyclosporine 07/31/2017 due to neurotoxicity with memory deficits and headaches as primary manifestation.     Past Medical History:  1. ESRD secondary to FSGS.   2. Hypertension  3. Scoliosis  S/P kidney transplant as stated above    Review of Systems    All other systems are reviewed and are negative.    Medications  Current Outpatient Medications   Medication Sig Dispense Refill   ??? amLODIPine (NORVASC) 5 MG tablet TAKE 1 TABLET (5 MG) BY MOUTH DAILY 30 tablet 11   ??? aspirin (ECOTRIN) 81 MG tablet TAKE 1 TABLET (81 MG) BY MOUTH DAILY 30 tablet 0   ??? cycloSPORINE modified (NEORAL) 25 MG capsule TAKE 5 CAPSULES (125MG ) BY MOUTH TWICE DAILY 300 each 11   ??? famotidine (PEPCID) 20 MG tablet Take 20 mg by mouth.     ??? fluticasone propionate (FLONASE) 50 mcg/actuation nasal spray 1 spray by Each Nare route daily. 16 g 4   ??? metoprolol tartrate (LOPRESSOR) 100 MG tablet Take 1 tablet (100 mg total) by mouth Two (2) times a day. 180 tablet 3   ??? MYFORTIC 180 mg EC tablet Take 1 tablet (180 mg total) by mouth Two (2) times a day. 60 tablet 11   ??? predniSONE (DELTASONE) 5 MG tablet TAKE 1 TABLET (5 MG) BY MOUTH DAILY 30 tablet 11   ??? ranitidine (ZANTAC) 150 MG tablet TAKE 1 TABLET BY MOUTH TWICE DAILY 180 tablet 3   ??? sertraline (ZOLOFT) 100 MG tablet Take 1 tablet (100 mg total) by mouth daily. 90 tablet 3   ??? traZODone (DESYREL) 50 MG tablet TAKE 1 TABLET BY MOUTH ONCE NIGHTLY 90 tablet 11   ??? acetaminophen (TYLENOL) 325 MG tablet Take 1-2 tablets (325-650 mg total) by mouth every four (4) hours as needed for pain. 100 tablet 0   ??? cyclobenzaprine (FLEXERIL) 10 MG tablet Take 1 tablet (10 mg total) by mouth two (2) times a day as needed for muscle spasms. (Patient not taking: Reported on 10/02/2017) 45 tablet 0   ??? gabapentin (NEURONTIN) 100 MG capsule Take 2 capsules (200 mg total) by mouth Three (3) times a day. (Patient not taking: Reported on 01/02/2018) 180 capsule 11   ??? loratadine (CLARITIN) 10 mg tablet Take 10 mg by mouth daily as needed  for allergies.     ??? traMADol (ULTRAM) 50 mg tablet Take 50 mg by mouth every six (6) hours as needed for pain.       No current facility-administered medications for this visit.        Physical Exam    BP 126/82 (BP Site: L Arm, BP Position: Sitting, BP Cuff Size: Large)  - Pulse 80  - Temp 36.4 ??C (97.5 ??F) (Temporal)  - Ht 149.9 cm (4' 11.02)  - Wt 92.4 kg (203 lb 9.6 oz)   General: well-appearing, in no acute distress  HEENT: anicteric sclera, OP clear  CV: RRR, no m/r/g, no edema  Lungs: CTAB, normal wob  Abd: soft, non-tender, non-distended  MSK: no visible joint swelling  Extremities: no edema.; right shin shows a little swelling below knee without erythema, warmth, pain, or bruising.   Skin: no visible lesions or rashes Psych: alert, engaged, appropriate mood and affect     Laboratory Results  Recent Results (from the past 170 hour(s))   Urinalysis    Collection Time: 06/21/18  2:11 PM   Result Value Ref Range    Color, UA Colorless     Clarity, UA Clear     Specific Gravity, UA 1.019 1.003 - 1.030    pH, UA 6.5 5.0 - 9.0    Leukocyte Esterase, UA Negative Negative    Nitrite, UA Negative Negative    Protein, UA Negative Negative    Glucose, UA Negative Negative    Ketones, UA Negative Negative    Urobilinogen, UA 4.0 mg/dL (A) 0.2 mg/dL, 1.0 mg/dL    Bilirubin, UA Negative Negative    Blood, UA Negative Negative    RBC, UA <1 <=4 /HPF    WBC, UA 1 0 - 5 /HPF    Squam Epithel, UA <1 0 - 5 /HPF    Bacteria, UA None Seen None Seen /HPF    Mucus, UA Rare (A) None Seen /HPF   Protein/Creatinine Ratio, Urine    Collection Time: 06/21/18  2:11 PM   Result Value Ref Range    Creat U 67.5 Undefined mg/dL    Protein, Ur 4.4 Undefined mg/dL    Protein/Creatinine Ratio, Urine 0.065 Undefined       Assessment and Plan  Gina Hart is a 42 y.o. female who is s/p deceased donor kidney transplant on 01/11/17. Active medical issues include:    1. Status post deceased donor kidney transplant on 01/11/17 with no history of rejection or evidence of FSGS recurrence. Renal function is stable with serum creatinine 0.83 mg/dL today. UP/C is again normal,  DSA screen was negative on 01/02/18.  EBV VL pending today.    2. Immunosuppression. Currently on cyclosporine 125 mg BID, myfortic 180 BID, and prednisone 5 mg daily. Cyclosporine level today wasn't drawn due to already taken medication.  I will continue the current regimen.     3. Hypertension. On amlodipine 10mg  daily and metoprolol tartrate 100mg  bid. BP is 120/78 today.  Will continue current antihypertensives.      4. Productive cough, sore throat and nasal congestion. Resolved.    5. Headaches, improved off tacrolimus.     6. Spinal stenosis, cervical. She is following with the Pain Clinic for management.      7. Health Maintenance. Will plan for pneumococcal and shingrix vaccines at her next clinic visit if  Shingrix is available.     8. Left ankle pain/right shin swelling below knee.  Patient to see local  orthopedic physician for ankle and right leg.    9. Memory Loss/Confusion.  Will order a Brain MRI without contrast to evaluate.  Has become worse lately even after switching from Tacrolimus to Cyclosporine.    10. Follow up. RTC 4 months. Labs Monday

## 2018-06-24 NOTE — Unmapped (Signed)
Per Dr. Margaretmary Bayley, MRI of Brain scheduled for 07/06/2018 @ 12:30pm here at [facility]. Pt made aware.

## 2018-06-27 LAB — EBV VIRAL LOAD RESULT: Lab: DETECTED — AB

## 2018-06-27 LAB — FSAB CLASS 1 ANTIBODY SPECIFICITY: HLA CLASS 1 ANTIBODY RESULT: POSITIVE

## 2018-06-27 LAB — CLASS 2 ANTIBODIES IDENTIFIED

## 2018-06-27 LAB — EBV QUANTITATIVE PCR, BLOOD: EBV VIRAL LOAD RESULT: DETECTED — AB

## 2018-06-27 LAB — HLA DS POST TRANSPLANT
ANTI-DONOR HLA-A #1 MFI: 69 MFI
ANTI-DONOR HLA-A #2 MFI: 137 MFI
ANTI-DONOR HLA-B #1 MFI: 205 MFI
ANTI-DONOR HLA-B #2 MFI: 392 MFI
ANTI-DONOR HLA-C #1 MFI: 173 MFI
ANTI-DONOR HLA-C #2 MFI: 28 MFI

## 2018-06-27 LAB — DONOR HLA-A ANTIGEN #2

## 2018-06-27 LAB — HLA CL1 ANTIBODY COMM: Lab: 0

## 2018-06-27 LAB — FSAB CLASS 2 ANTIBODY SPECIFICITY: HLA CL2 AB RESULT: POSITIVE

## 2018-07-06 ENCOUNTER — Encounter: Admit: 2018-07-06 | Discharge: 2018-07-07 | Payer: MEDICARE

## 2018-07-06 DIAGNOSIS — Z79899 Other long term (current) drug therapy: Secondary | ICD-10-CM

## 2018-07-06 DIAGNOSIS — Z940 Kidney transplant status: Principal | ICD-10-CM

## 2018-07-09 NOTE — Unmapped (Signed)
[facility] Refill Coordination Note    Specialty Medication(s) to be Shipped:   Transplant: cyclosporine 25mg      Gina Hart, DOB: [birth year]  Phone: [phone] (home)     All above HIPAA information was verified with patient.     Completed refill call assessment today to schedule patient's medication shipment from the [facility] [phone]).       Specialty medication(s) and dose(s) confirmed: Regimen is correct and unchanged.   Changes to medications: Gina Hart reports no changes reported at this time.  Changes to insurance: No  Questions for the pharmacist: No    The patient will receive a drug information handout for each medication shipped and additional FDA Medication Guides as required.      DISEASE/MEDICATION-SPECIFIC INFORMATION        N/A    ADHERENCE     Medication Adherence    Patient reported X missed doses in the last month:  0  Specialty Medication:  Cyclosporine 25mg    Patient is on additional specialty medications:  No  Patient is on more than two specialty medications:  No  Any gaps in refill history greater than 2 weeks in the last 3 months:  no  Demonstrates understanding of importance of adherence:  yes  Informant:  patient  Reliability of informant:  reliable      Adherence tools used:  patient uses a pill box to manage medications          Confirmed plan for next specialty medication refill:  delivery by pharmacy          Refill Coordination    Has the Patients' Contact Information Changed:  No  Is the Shipping Address Different:  No         MEDICARE PART B DOCUMENTATION     Cyclosporine modified 25mg : Patient has 10 days worth of capsules on hand.    SHIPPING     Shipping address confirmed in Epic.     Delivery Scheduled: Yes, Expected medication delivery date: 07/17/2018 via UPS or courier.     Medication will be delivered via UPS to the home address in [employer] [place].    Gina Hart Allena Katz   [facility] Specialty Technician

## 2018-07-16 MED ORDER — AMLODIPINE 5 MG TABLET
ORAL_TABLET | 10 refills | 0 days | Status: CP
Start: 2018-07-16 — End: ?

## 2018-07-16 MED FILL — CYCLOSPORINE MODIFIED 25 MG CAPSULE: 30 days supply | Qty: 300 | Fill #5 | Status: AC

## 2018-07-16 MED FILL — CYCLOSPORINE MODIFIED 25 MG CAPSULE: 30 days supply | Qty: 300 | Fill #5

## 2018-07-16 NOTE — Unmapped (Signed)
Pt request for RX Refill

## 2018-07-24 ENCOUNTER — Ambulatory Visit (INDEPENDENT_AMBULATORY_CARE_PROVIDER_SITE_OTHER): Payer: Medicare Other | Admitting: Orthopaedic Surgery

## 2018-07-24 ENCOUNTER — Encounter: Payer: Self-pay | Admitting: Orthopaedic Surgery

## 2018-07-24 ENCOUNTER — Ambulatory Visit (INDEPENDENT_AMBULATORY_CARE_PROVIDER_SITE_OTHER): Payer: Medicare Other

## 2018-07-24 VITALS — BP 136/89 | HR 73 | Ht 59.0 in | Wt 203.0 lb

## 2018-07-24 DIAGNOSIS — M25572 Pain in left ankle and joints of left foot: Secondary | ICD-10-CM

## 2018-07-24 DIAGNOSIS — G8929 Other chronic pain: Secondary | ICD-10-CM

## 2018-07-24 DIAGNOSIS — Z940 Kidney transplant status: Secondary | ICD-10-CM | POA: Diagnosis not present

## 2018-07-24 NOTE — Progress Notes (Signed)
Subjective:    Patient ID: Vicki Mercer, female    DOB: 29-Jul-1975, 43 y.o.   MRN: [id number]  HPI She has had pain in the left ankle for some time now.  She has lateral tenderness and swelling.  It just does not feel right. She has no giving way, no redness, but a feeling of tenderness.  She has had a renal transplant in 2018 and is on prednisone 5 mgm daily.  She has no trauma, no discoloration.  She has not had change in shoe wear.  She has no numbness.  She has taken Tylenol with no change.   Review of Systems  Constitutional: Positive for activity change.  Musculoskeletal: Positive for arthralgias, gait problem and joint swelling.  All other systems reviewed and are negative.  For Review of Systems, all other systems reviewed and are negative.  The following is a summary of the past history medically, past history surgically, known current medicines, social history and family history.  This information is gathered electronically by the computer from prior information and documentation.  I review this each visit and have found including this information at this point in the chart is beneficial and informative.   Past Medical History:  Diagnosis Date  . Anemia   . Hypertension   . Renal disorder    kidney transplant (right) 01/11/2017    Past Surgical History:  Procedure Laterality Date  . AV FISTULA PLACEMENT Right 2014   forearm  . KIDNEY TRANSPLANT Right 01/11/2017   at [facility]; original kidneys remain  . SPINE SURGERY  1991   spinal fusion at [facility]    Current Outpatient Medications on File Prior to Visit  Medication Sig Dispense Refill  . acetaminophen (TYLENOL) 500 MG tablet Take 500 mg by mouth every 6 (six) hours as needed.    Marland Kitchen amLODipine (NORVASC) 5 MG tablet Take 10 mg by mouth daily.    Marland Kitchen aspirin EC 81 MG tablet Take 81 mg by mouth daily.    . cycloSPORINE modified (NEORAL) 25 MG capsule TAKE 5 CAPSULES (125MG ) BY MOUTH TWICE DAILY    . metoprolol  tartrate (LOPRESSOR) 100 MG tablet Take 100 mg by mouth 2 (two) times daily.    . mycophenolate (MYFORTIC) 180 MG EC tablet Take 180 mg by mouth 2 (two) times daily.    . sertraline (ZOLOFT) 100 MG tablet Take 100 mg by mouth daily.    . TRAZODONE HCL PO Take 50 mg/day by mouth daily.    . cyclobenzaprine (FLEXERIL) 10 MG tablet Take 10 mg by mouth 2 (two) times daily.    . famotidine (PEPCID) 20 MG tablet Take 20 mg by mouth every morning.    . gabapentin (NEURONTIN) 100 MG capsule Take 200 mg by mouth 3 (three) times daily.    Marland Kitchen loratadine (CLARITIN) 10 MG tablet Take 10 mg by mouth daily as needed for allergies.    Marland Kitchen tacrolimus (PROGRAF) 1 MG capsule Take 1 mg by mouth 2 (two) times daily.     No current facility-administered medications on file prior to visit.     Social History   Socioeconomic History  . Marital status: Single    Spouse name: Not on file  . Number of children: Not on file  . Years of education: Not on file  . Highest education level: Not on file  Occupational History  . Occupation: CNA    Comment: part time  Social Needs  . Financial resource strain: Not on  file  . Food insecurity:    Worry: Not on file    Inability: Not on file  . Transportation needs:    Medical: Not on file    Non-medical: Not on file  Tobacco Use  . Smoking status: Never Smoker  . Smokeless tobacco: Never Used  Substance and Sexual Activity  . Alcohol use: No  . Drug use: No  . Sexual activity: Never  Lifestyle  . Physical activity:    Days per week: Not on file    Minutes per session: Not on file  . Stress: Not on file  Relationships  . Social connections:    Talks on phone: Not on file    Gets together: Not on file    Attends religious service: Not on file    Active member of club or organization: Not on file    Attends meetings of clubs or organizations: Not on file    Relationship status: Not on file  . Intimate partner violence:    Fear of current or ex partner: Not  on file    Emotionally abused: Not on file    Physically abused: Not on file    Forced sexual activity: Not on file  Other Topics Concern  . Not on file  Social History Narrative  . Not on file    Family History  Problem Relation Age of Onset  . High blood pressure Mother     BP 136/89   Pulse 73   Ht 4\' 11"  (1.499 m)   Wt 203 lb (92.1 kg)   BMI 41.00 kg/m   Body mass index is 41 kg/m.     Objective:   Physical Exam Constitutional:      Appearance: She is well-developed.  HENT:     Head: Normocephalic and atraumatic.  Eyes:     Conjunctiva/sclera: Conjunctivae normal.     Pupils: Pupils are equal, round, and reactive to light.  Neck:     Musculoskeletal: Normal range of motion and neck supple.  Cardiovascular:     Rate and Rhythm: Normal rate and regular rhythm.  Pulmonary:     Effort: Pulmonary effort is normal.  Abdominal:     Palpations: Abdomen is soft.  Musculoskeletal:     Left ankle: She exhibits swelling. Tenderness. Lateral malleolus and AITFL tenderness found.       Feet:  Skin:    General: Skin is warm and dry.  Neurological:     Mental Status: She is alert and oriented to person, place, and time.     Cranial Nerves: No cranial nerve deficit.     Motor: No abnormal muscle tone.     Coordination: Coordination normal.     Deep Tendon Reflexes: Reflexes are normal and symmetric. Reflexes normal.  Psychiatric:        Behavior: Behavior normal.        Thought Content: Thought content normal.        Judgment: Judgment normal.      X-rays were done of the left ankle, reported separately.     Assessment & Plan:   Encounter Diagnoses  Name Primary?  . Chronic pain of left ankle Yes  . Renal transplant, status post    I have recommended Aspercreme to the area or BioFreeze.  I will schedule PT.  Return in three weeks.  Call if any problem.  Precautions discussed.   Electronically Signed Sanjuana Kava, MD 1/29/20209:32 AM

## 2018-07-24 NOTE — Patient Instructions (Signed)
An order for physical therapy has been set to [facility] in [place] for you to have therapy.  Their phone number is [phone].  They will call you to schedule the therapy.  If you do not receive a call in a reasonable amount of time you can call them to schedule the appointment.

## 2018-07-31 ENCOUNTER — Telehealth: Payer: Self-pay | Admitting: Orthopaedic Surgery

## 2018-07-31 NOTE — Telephone Encounter (Signed)
Patient called to relay that she has not heard from [facility] in [place]. Also, her 2ndary insurance is Medicaid, and made aware best to schedule with therapy affiliated with hospital system. Since closest [facility] location is [place], patient said closest for her is "[facility]," which is associated with [facility]. Please send order there. (Verified Ph [phone] and Fax [phone].)

## 2018-08-06 MED ORDER — CYCLOSPORINE MODIFIED 25 MG CAPSULE
11 refills | 0 days | Status: CP
Start: 2018-08-06 — End: 2019-08-06
  Filled 2018-08-13: qty 300, 30d supply, fill #0

## 2018-08-06 NOTE — Telephone Encounter (Signed)
Faxed

## 2018-08-06 NOTE — Unmapped (Signed)
[facility] Refill Coordination Note    Specialty Medication(s) to be Shipped:   Transplant: cyclosporine 25mg     Other medication(s) to be shipped: none     Gina Hart, DOB: May 13, 1976  Phone: [phone] (home)       All above HIPAA information was verified with patient.     Completed refill call assessment today to schedule patient's medication shipment from the [facility] [phone]).       Specialty medication(s) and dose(s) confirmed: Regimen is correct and unchanged.   Changes to medications: Lamica reports no changes reported at this time.  Changes to insurance: No  Questions for the pharmacist: No    The patient will receive a drug information handout for each medication shipped and additional FDA Medication Guides as required.      DISEASE/MEDICATION-SPECIFIC INFORMATION        N/A    ADHERENCE     Medication Adherence    Patient reported X missed doses in the last month:  0  Adherence tools used:  patient uses a pill box to manage medications              MEDICARE PART B DOCUMENTATION     Cyclosporine modified 25mg : Patient has 25 capsules on hand.    SHIPPING     Shipping address confirmed in Epic.     Delivery Scheduled: Yes, Expected medication delivery date: 08/14/18 via UPS or courier.     Medication will be delivered via UPS to the home address in Epic WAM.    [place] A William Laske   [facility] Specialty Technician

## 2018-08-13 MED FILL — CYCLOSPORINE MODIFIED 25 MG CAPSULE: 30 days supply | Qty: 300 | Fill #0 | Status: AC

## 2018-08-14 ENCOUNTER — Ambulatory Visit: Payer: Medicare Other | Admitting: Orthopaedic Surgery

## 2018-08-26 MED ORDER — MYFORTIC 180 MG TABLET,DELAYED RELEASE
ORAL_TABLET | Freq: Two times a day (BID) | ORAL | 11 refills | 0 days | Status: CP
Start: 2018-08-26 — End: 2019-08-26

## 2018-08-26 NOTE — Unmapped (Signed)
Pt request for RX Refill

## 2018-08-28 DIAGNOSIS — Z940 Kidney transplant status: Principal | ICD-10-CM

## 2018-08-28 DIAGNOSIS — Z79899 Other long term (current) drug therapy: Principal | ICD-10-CM

## 2018-08-29 ENCOUNTER — Ambulatory Visit: Payer: Medicare Other | Admitting: Orthopaedic Surgery

## 2018-09-02 LAB — CBC W/ DIFFERENTIAL
BASOPHILS ABSOLUTE COUNT: 0 10*9/L — ABNORMAL LOW
BASOPHILS RELATIVE PERCENT: 0.5 %
EOSINOPHILS ABSOLUTE COUNT: 0.1 10*9/L
EOSINOPHILS RELATIVE PERCENT: 2.2 %
HEMATOCRIT: 41.9 %
HEMOGLOBIN: 14.4 g/dL
LYMPHOCYTES ABSOLUTE COUNT: 0.8 10*9/L — ABNORMAL LOW
LYMPHOCYTES RELATIVE PERCENT: 22.3 %
MACROCYTES: 0.1 10*9/L — ABNORMAL LOW
MEAN CORPUSCULAR HEMOGLOBIN CONC: 34.3 g/dL
MEAN CORPUSCULAR HEMOGLOBIN: 31.2 pg
MEAN CORPUSCULAR VOLUME: 90.8 fL
MEAN PLATELET VOLUME: 7.6 fL
MONOCYTES ABSOLUTE COUNT: 0.4 10*9/L
NEUTROPHILS ABSOLUTE COUNT: 2.2 10*9/L
NEUTROPHILS RELATIVE PERCENT: 63 %
PLATELET COUNT: 262 10*9/L
RED BLOOD CELL COUNT: 4.6 10*12/L
WBC ADJUSTED: 3.5 10*9/L — ABNORMAL LOW

## 2018-09-02 LAB — BASIC METABOLIC PANEL
BLOOD UREA NITROGEN: 20 mg/dL — ABNORMAL HIGH
CALCIUM: 9.9 mg/dL
CHLORIDE: 105 mmol/L
CO2: 25.4 mmol/L
EGFR MDRD AF AMER: 83 mL/min/{1.73_m2}
GLUCOSE RANDOM: 92 mg/dL
SODIUM: 139 mmol/L

## 2018-09-04 LAB — CBC W/ DIFFERENTIAL
BASOPHILS ABSOLUTE COUNT: 0 10*9/L — ABNORMAL LOW
BASOPHILS RELATIVE PERCENT: 0.5 %
BASOPHILS RELATIVE PERCENT: 0.5 % — ABNORMAL LOW
EOSINOPHILS ABSOLUTE COUNT: 0.1 10*9/L
EOSINOPHILS RELATIVE PERCENT: 2.2 %
HEMATOCRIT: 41.9 %
LYMPHOCYTES ABSOLUTE COUNT: 0.8 10*9/L — ABNORMAL LOW
LYMPHOCYTES RELATIVE PERCENT: 22.3 %
MEAN CORPUSCULAR HEMOGLOBIN CONC: 34.3 g/dL
MEAN CORPUSCULAR HEMOGLOBIN: 31.2 pg
MEAN CORPUSCULAR VOLUME: 90.8 fL
MONOCYTES ABSOLUTE COUNT: 0.4 10*9/L
MONOCYTES RELATIVE PERCENT: 12 %
NEUTROPHILS ABSOLUTE COUNT: 2.2 10*9/L
PLATELET COUNT: 262 10*9/L
RED BLOOD CELL COUNT: 4.6 10*12/L
RED CELL DISTRIBUTION WIDTH: 12.8 %
WHITE BLOOD CELL COUNT: 3.5 10*9/L — ABNORMAL LOW

## 2018-09-08 NOTE — Unmapped (Signed)
Reason for Disposition  ??? Reasonable improvement on antibiotic    Protocols used: PNEUMONIA ON ANTIBIOTIC FOLLOW-UP CALL-A-AH

## 2018-09-08 NOTE — Unmapped (Signed)
Recent:   What is the date of your last related visit?  09/05/18 PCP Dx: Pneumonia  Related acute medications Rx'd:  Doxycycline twice daily  Home treatment tried:  Tylenol 1000 mg @ 1945    Relevant:   Allergies: Ibuprofen  Medications: Myfortic, cyclosporine, prednisone  Health History: s/p renal transplant 2018, HTN, ESRD, Immunosuppressed status  Weight: N/A    Reason for Disposition  ??? [1] Diabetes mellitus or weak immune system (e.g., HIV positive, cancer chemo, splenectomy, organ transplant, chronic steroids) AND [2] new onset of fever > 100.0 F (37.8 C)    Answer Assessment - Initial Assessment Questions  1. SYMPTOMS: What symptoms are you most concerned about? Is it better, the same, or worse compared to when you saw the doctor?      New onset low grade fever.   On antibiotics for PNA for 2 days.  2. BREATHING DIFFICULTY: Are you having any difficulty breathing? If so, ask How bad is it?  (e.g., none, mild, moderate, severe)    - MILD: No SOB at rest, mild SOB with walking, speaks normally in sentences, can lay down, no retractions, pulse < 100.     - MODERATE: SOB at rest, SOB with minimal exertion and prefers to sit, cannot lie down flat, speaks in phrases, mild retractions, audible wheezing, pulse 100-120.     - SEVERE: Very SOB at rest, speaks in single words, struggling to breathe, sitting hunched forward, retractions, pulse > 120       SOB with exertion but not at rest, occasional audible wheezing. No chest pain or cyanosis.  Patient feels about the same as she did during her office visit on 09/05/18.  3. FEVER: Do you have a fever? If so, ask: What is your temperature, how was it measured, and when did it start?      Oral temperature 99.2 as read @1945 ; recheck after tylenol 1000mg  98.7 orally as read.  4. SPUTUM: Describe the color of your sputum (clear, white, yellow, green, blood-tinged)      Sputum is clear, slightly foamy per patient.  Small amount.  5. DIAGNOSIS CONFIRMATION: When was the pneumonia diagnosed? By whom?      09/04/17 PCP in office (by physical exam).  Patient states PNA is on the right.  6. ANTIBIOTIC: Are you taking an antibiotic?  If so, Which one? When was it started?      Doxycycline.  Started the evening of 09/05/18.  7. OTHER TREATMENT: Are you receiving any other treatment for the pneumonia? (e.g., albuterol nebulizer, oxygen) If so, ask, How often? and Do they help?      No  8. HOSPITAL ADMISSION: Were you hospitalized for this pneumonia? If so, ask When were you discharged home from the hospital?      Not hospitalized.    Protocols used: PNEUMONIA ON ANTIBIOTIC FOLLOW-UP CALL-A-AH

## 2018-09-09 NOTE — Unmapped (Signed)
[facility] Clinical Assessment & Refill Coordination Note    Gina Hart, DOB: 1976-02-03  Phone: [phone] (home)     All above HIPAA information was verified with patient.     Specialty Medication(s):   Transplant: cyclosporine 25mg      Current Outpatient Medications   Medication Sig Dispense Refill   ??? acetaminophen (TYLENOL) 325 MG tablet Take 1-2 tablets (325-650 mg total) by mouth every four (4) hours as needed for pain. 100 tablet 0   ??? amLODIPine (NORVASC) 5 MG tablet TAKE (2) TABLETS BY MOUTH ONCE DAILY. 60 tablet 10   ??? aspirin (ECOTRIN) 81 MG tablet TAKE 1 TABLET (81 MG) BY MOUTH DAILY 30 tablet 0   ??? cyclobenzaprine (FLEXERIL) 10 MG tablet Take 1 tablet (10 mg total) by mouth two (2) times a day as needed for muscle spasms. (Patient not taking: Reported on 10/02/2017) 45 tablet 0   ??? cycloSPORINE modified (NEORAL) 25 MG capsule TAKE 5 CAPSULES (125MG ) BY MOUTH TWICE DAILY 300 each 11   ??? famotidine (PEPCID) 20 MG tablet Take 20 mg by mouth.     ??? fluticasone propionate (FLONASE) 50 mcg/actuation nasal spray 1 spray by Each Nare route daily. 16 g 4   ??? gabapentin (NEURONTIN) 100 MG capsule Take 2 capsules (200 mg total) by mouth Three (3) times a day. (Patient not taking: Reported on 01/02/2018) 180 capsule 11   ??? loratadine (CLARITIN) 10 mg tablet Take 10 mg by mouth daily as needed for allergies.     ??? metoprolol tartrate (LOPRESSOR) 100 MG tablet Take 1 tablet (100 mg total) by mouth Two (2) times a day. 180 tablet 3   ??? MYFORTIC 180 mg EC tablet Take 1 tablet (180 mg total) by mouth Two (2) times a day. 60 tablet 11   ??? ranitidine (ZANTAC) 150 MG tablet TAKE 1 TABLET BY MOUTH TWICE DAILY 180 tablet 3   ??? sertraline (ZOLOFT) 100 MG tablet Take 1 tablet (100 mg total) by mouth daily. 90 tablet 3   ??? traMADol (ULTRAM) 50 mg tablet Take 50 mg by mouth every six (6) hours as needed for pain.     ??? traZODone (DESYREL) 50 MG tablet TAKE 1 TABLET BY MOUTH ONCE NIGHTLY 90 tablet 11     No current facility-administered medications for this visit.         Changes to medications: Gina Hart reports no changes reported at this time.    Allergies   Allergen Reactions   ??? Ibuprofen Swelling     Other reaction(s): SWELLING/EDEMA       Changes to allergies: No    SPECIALTY MEDICATION ADHERENCE     Cyclosporine 25 mg: 7 days of medicine on hand       Medication Adherence    Patient reported X missed doses in the last month:  0  Specialty Medication:  cyclosporine  Patient is on additional specialty medications:  No          Specialty medication(s) dose(s) confirmed: Regimen is correct and unchanged.     Are there any concerns with adherence? No    Adherence counseling provided? Not needed    CLINICAL MANAGEMENT AND INTERVENTION      Clinical Benefit Assessment:    Do you feel the medicine is effective or helping your condition? Yes    Clinical Benefit counseling provided? Not needed    Adverse Effects Assessment:    Are you experiencing any side effects? No  Are you experiencing difficulty administering your medicine? No    Quality of Life Assessment:    How many days over the past month did your transplant keep you from your normal activities? For example, brushing your teeth or getting up in the morning. 0    Have you discussed this with your provider? Not needed    Therapy Appropriateness:    Is therapy appropriate? Yes, therapy is appropriate and should be continued    DISEASE/MEDICATION-SPECIFIC INFORMATION      N/A    PATIENT SPECIFIC NEEDS     ? Does the patient have any physical, cognitive, or cultural barriers? No    ? Is the patient high risk? No     ? Does the patient require a Care Management Plan? No     ? Does the patient require physician intervention or other additional services (i.e. nutrition, smoking cessation, social work)? No      SHIPPING     Specialty Medication(s) to be Shipped:   Transplant: cyclosporine 25mg     Other medication(s) to be shipped: n/a     Changes to insurance: No    Delivery Scheduled: Yes, Expected medication delivery date: 09/11/2018.     Medication will be delivered via UPS to the confirmed home address in [facility].    The patient will receive a drug information handout for each medication shipped and additional FDA Medication Guides as required.  Verified that patient has previously received a Conservation officer, historic buildings.    Gina Hart  Gina Hart   [facility] Specialty Pharmacist

## 2018-09-10 ENCOUNTER — Encounter: Admit: 2018-09-10 | Discharge: 2018-09-10 | Payer: MEDICARE | Attending: Nephrology | Primary: Nephrology

## 2018-09-10 ENCOUNTER — Ambulatory Visit: Admit: 2018-09-10 | Discharge: 2018-09-10 | Payer: MEDICARE

## 2018-09-10 DIAGNOSIS — J069 Acute upper respiratory infection, unspecified: Secondary | ICD-10-CM | POA: Insufficient documentation

## 2018-09-10 DIAGNOSIS — I10 Essential (primary) hypertension: Principal | ICD-10-CM

## 2018-09-10 DIAGNOSIS — Z1383 Encounter for screening for respiratory disorder NEC: Principal | ICD-10-CM

## 2018-09-10 DIAGNOSIS — I272 Pulmonary hypertension, unspecified: Principal | ICD-10-CM

## 2018-09-10 DIAGNOSIS — Z48298 Encounter for aftercare following other organ transplant: Principal | ICD-10-CM

## 2018-09-10 DIAGNOSIS — D899 Disorder involving the immune mechanism, unspecified: Principal | ICD-10-CM

## 2018-09-10 MED FILL — CYCLOSPORINE MODIFIED 25 MG CAPSULE: 30 days supply | Qty: 300 | Fill #1 | Status: AC

## 2018-09-10 MED FILL — CYCLOSPORINE MODIFIED 25 MG CAPSULE: 30 days supply | Qty: 300 | Fill #1

## 2018-09-10 NOTE — Unmapped (Signed)
Transplant Nephrology Clinic Visit    History of Present Illness  Gina Hart is a 43 y.o. female who is s/p deceased kidney transplant on January 21, 2017. She has experienced no episodes of rejection with normal urinary protein and baseline creatinine <1.0 mg/dL.   She presents today for a post-transplant visit with several complaints.     She states that approximately 3 weeks ago she developed cough, fever, and myalgias and was diagnosed with influenza A on 08/15/18 in [place]. She was treated with Tamiflu. After initially improving she developed worsening cough, shortness of breath, nausea, sore throat and diarrhea. She saw her PCP 5 days ago and was started on doxycycline for presumed pneumonia after influenza. She states she did not have a chest x-ray. Since starting antibiotics her symptoms have improved minimally. She denies fever. She has been taking all medications. She denies dysuria or pain over the allograft. She has mild intermittent headache which is improved from when she was on tacrolimus.  She denies memory deficits since switching from tacrolimus to cyclosporine.    Transplant History:  1. 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  2. S/P kidney transplant at [facility] 2107-01-12. Deceased donor with KDPI 19%. CMV D+/R-; EBV D+/R+  3. Immunosuppression = campath induction followed by tacrolimus and myfortic maintenance therapy, steroid free regimen.   4. Tacrolimus changed to cyclosporine 07/31/2017 due to neurotoxicity with memory deficits and headaches as primary manifestation.     Past Medical History:  1. ESRD secondary to FSGS.   2. Hypertension  3. Scoliosis  S/P kidney transplant as stated above    Review of Systems    All other systems are reviewed and are negative.    Medications  Current Outpatient Medications   Medication Sig Dispense Refill   ??? acetaminophen (TYLENOL) 325 MG tablet Take 1-2 tablets (325-650 mg total) by mouth every four (4) hours as needed for pain. 100 tablet 0   ??? amLODIPine (NORVASC) 5 MG tablet TAKE (2) TABLETS BY MOUTH ONCE DAILY. 60 tablet 10   ??? aspirin (ECOTRIN) 81 MG tablet TAKE 1 TABLET (81 MG) BY MOUTH DAILY 30 tablet 0   ??? cyclobenzaprine (FLEXERIL) 10 MG tablet Take 1 tablet (10 mg total) by mouth two (2) times a day as needed for muscle spasms. (Patient not taking: Reported on 10/02/2017) 45 tablet 0   ??? cycloSPORINE modified (NEORAL) 25 MG capsule TAKE 5 CAPSULES (125MG ) BY MOUTH TWICE DAILY 300 each 11   ??? famotidine (PEPCID) 20 MG tablet Take 20 mg by mouth.     ??? fluticasone propionate (FLONASE) 50 mcg/actuation nasal spray 1 spray by Each Nare route daily. 16 g 4   ??? gabapentin (NEURONTIN) 100 MG capsule Take 2 capsules (200 mg total) by mouth Three (3) times a day. (Patient not taking: Reported on 01/02/2018) 180 capsule 11   ??? loratadine (CLARITIN) 10 mg tablet Take 10 mg by mouth daily as needed for allergies.     ??? metoprolol tartrate (LOPRESSOR) 100 MG tablet Take 1 tablet (100 mg total) by mouth Two (2) times a day. 180 tablet 3   ??? MYFORTIC 180 mg EC tablet Take 1 tablet (180 mg total) by mouth Two (2) times a day. 60 tablet 11   ??? ranitidine (ZANTAC) 150 MG tablet TAKE 1 TABLET BY MOUTH TWICE DAILY 180 tablet 3   ??? sertraline (ZOLOFT) 100 MG tablet Take 1 tablet (100 mg total) by mouth daily. 90 tablet 3   ???  traMADol (ULTRAM) 50 mg tablet Take 50 mg by mouth every six (6) hours as needed for pain.     ??? traZODone (DESYREL) 50 MG tablet TAKE 1 TABLET BY MOUTH ONCE NIGHTLY 90 tablet 11     No current facility-administered medications for this visit.        Physical Exam    BP 118/68  - Temp 36.9 ??C (98.5 ??F) (Temporal)  - Wt 88.8 kg (195 lb 12.8 oz)   General: coughing frequently with mask in place  HEENT: anicteric sclera  CV: RRR, no m/r/g, no edema  Lungs: CTAB  Abd: soft, non-tender, non-distended  Extremities: no edema.  Skin: no visible lesions or rashes  Psych: alert, engaged, appropriate mood and affect     Laboratory Results  No results found for this or any previous visit (from the past 170 hour(s)).    Assessment and Plan  Gina Hart is a 43 y.o. female who is s/p deceased donor kidney transplant on 01/11/17. She presents today with persistent illness following a diagnosis of influenza A on 2/20. Active medical issues include:    1. Acute respiratory illness.  Symptoms are consistent with post-influenza pneumonia for which she is receiving doxycyline. Her symptoms could also be consistent with a viral illness including COVID-19. We will check a respiratory pathogen panel and COVID-19 testing today.  Further diagnostic testing and treatment with be guided by these results. Patient has been counseled regarding the need for quarantine.  O2 saturation is 96% on room air and lungs are clear. I do not feel she currently requires hospitalization. We will remain in contact with her by phone.     2. Status post deceased donor kidney transplant on 01/11/17 with no history of rejection or evidence of FSGS recurrence. Renal function has been stable with serum creatinine 0.90 mg/dL on 4/0/98. UP/C has been historically normal. DSA screen was negative on 01/02/18 though class II could not be checked due to an interfering substance.    3. Immunosuppression. Currently on cyclosporine 125 mg BID, myfortic 180 BID, and prednisone 5 mg daily. Cyclosporine level on 09/02/18 was 184 (target 125-200). I will continue the current regimen.     4. Hypertension. On amlodipine 10mg  daily and metoprolol tartrate 100mg  bid. BP is 118/68 today.  Will continue current antihypertensives.      5. Headaches, improved off tacrolimus.     6. Spinal stenosis, cervical. She is [facility] with [facility] for management.      7. Health Maintenance. Will defer pneumococcal vaccines due to her respiratory illness.      8. Memory Loss/Confusion. Brain MRI 07/06/18 was normal. Symptoms have improved on cyclosporine.    9. Follow up. Patient was sent to [facility] for COVID and RPP testing. Further treatment will be guided by these results.

## 2018-09-10 NOTE — Unmapped (Signed)
Assessment     Gina Hart is a 43 y.o. female presenting to [facility] for COVID testing.     Plan     Testing performed for Rapid Flu/RSV with COVID reflex.  Patient directed to Home given findings during today's visit.    Subjective     Gina Hart is a 43 y.o. female who [facility] with complaints of Chills, Runny nose, Sore throat, Coug (new onset or worsening of chronic cough), Shortness of breath, Nausea or vomiting and Headache.    Exposure History: In the last 14 days?     Have you traveled outside of [place]? No                City/State Country (if outside [place])                  Have you been in close contact with someone confirmed by a test to have COVID? (Close contact is within 6 feet for at least 10 minutes) No         Worked in a health care facility?   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 21   Muscle aches No   Runny nose Yes, how many days? 21   Sore Throat Yes, how many days? 21   Cough (new onset or worsening of chronic cough) Yes, how many days? 21   Shortness of breath Yes, how many days? 21   Nausea or vomiting Yes, how many days? 21   Headache Yes, how many days? 21   Abdominal Pain No   Diarrhea (3 or more loose stools in last 24 hours) No     History/Medical Conditions:    Do you have any of the following:   Asthma or emphysema or COPD No   Cystic Fibrosis No   Diabetes No   Cardiovascular Disease No   Chronic Kidney Disease Yes   Chronic Liver Disease No   Immunosuppression due to disease or medication Yes, Which: not specified   Neurologic disease/condition No   Other chronic diseases No   Pregnant No   Current smoker No   Former smoker No         Objective     Testing Performed:  Test Specimen Type Sent to   Swab for Rapid Flu/RSV with COVID reflex NP [facility]       ______________________________________________________________________    Documentation assistance was provided by Janeann Forehand, Scribe, on September 10, 2018 at 1:28 PM for Princella Pellegrini      The documentation recorded by the scribe accurately reflects the service I personally performed and the decisions made by me. AMIR Burlene Arnt, DO

## 2018-09-10 NOTE — Unmapped (Signed)
Pt brought to room 8. She is actively coughing with SOB. Afebrile. Pt is wearing a mask. Droplet precautions put in place. MD notified and message sent to covid-19 schedulers.

## 2018-09-11 NOTE — Unmapped (Signed)
Identifying Information:  Gina Hart  [birth year]  [id number]     Notifying Caller: Royetta Crochet Gerasimos Plotts    Reviewed with them the results of flu/RSV PCR.  If this is positive, we do not test for COVID.  They should practice good hand hygiene and making sure they are practicing common measure to prevent transmission of disease.     Tested positive for RSV.    If worsening symptoms of cough, fevers, or shortness of breath, have notified them to call the [facility] at [phone].

## 2018-09-11 NOTE — Unmapped (Signed)
Contacted patient to discuss lab results. Patient is unavailable and voicemail box was full.

## 2018-09-12 ENCOUNTER — Ambulatory Visit: Payer: Medicare Other | Admitting: Orthopaedic Surgery

## 2018-09-12 ENCOUNTER — Encounter: Payer: Self-pay | Admitting: Orthopaedic Surgery

## 2018-09-13 NOTE — Unmapped (Signed)
Reason for Disposition  ??? Information only question and nurse able to answer    Answer Assessment - Initial Assessment Questions  1. REASON FOR CALL or QUESTION: What is your reason for calling today? or How can I best help you? or What question do you have that I can help answer?      Was tested positive for RSV.  Instructed to take precautions to protect family members as RSV is contagious.    Protocols used: INFORMATION ONLY CALL - NO TRIAGE-ADULT-[place]

## 2018-09-14 DIAGNOSIS — Z940 Kidney transplant status: Principal | ICD-10-CM

## 2018-09-14 DIAGNOSIS — J3089 Other allergic rhinitis: Principal | ICD-10-CM

## 2018-09-14 MED ORDER — FLUTICASONE PROPIONATE 50 MCG/ACTUATION NASAL SPRAY,SUSPENSION
Freq: Every day | NASAL | 4 refills | 0.00000 days | Status: CP
Start: 2018-09-14 — End: 2019-09-14

## 2018-09-16 MED ORDER — PREDNISONE 5 MG TABLET
ORAL_TABLET | Freq: Every day | ORAL | 11 refills | 0.00000 days | Status: CP
Start: 2018-09-16 — End: 2019-09-16

## 2018-10-02 NOTE — Unmapped (Signed)
[facility] Pharm[facility] Refill Coordination Note    Specialty Medication(s) to be Shipped:   Transplant: cyclosporine 25mg     Other medication(s) to be shipped: ranitidine 150mg      Gina Hart, DOB: 1975/10/15  Phone: [phone] (home)       All above HIPAA information was verified with patient.     Completed refill call assessment today to schedule patient's medication shipment from the [facility] [phone]).       Specialty medication(s) and dose(s) confirmed: Regimen is correct and unchanged.   Changes to medications: Gina Hart reports no changes reported at this time.  Changes to insurance: No  Questions for the pharmacist: No    Confirmed patient received Welcome Packet with first shipment. The patient will receive a drug information handout for each medication shipped and additional FDA Medication Guides as required.       DISEASE/MEDICATION-SPECIFIC INFORMATION        N/A    SPECIALTY MEDICATION ADHERENCE        cyclosporine 25mg : 10 days worth of on hand.      SHIPPING     Shipping address confirmed in Epic.     Delivery Scheduled: Yes, Expected medication delivery date: 10/10/18.     Medication will be delivered via UPS to the home address in [place].    Gina Hart   [facility] Shared [facility] Specialty Technician

## 2018-10-09 MED FILL — CYCLOSPORINE MODIFIED 25 MG CAPSULE: 30 days supply | Qty: 300 | Fill #2 | Status: AC

## 2018-10-09 MED FILL — RANITIDINE 150 MG TABLET: 90 days supply | Qty: 180 | Fill #2 | Status: AC

## 2018-10-09 MED FILL — CYCLOSPORINE MODIFIED 25 MG CAPSULE: 30 days supply | Qty: 300 | Fill #2

## 2018-10-09 MED FILL — RANITIDINE 150 MG TABLET: ORAL | 90 days supply | Qty: 180 | Fill #2

## 2018-10-14 LAB — CBC W/ DIFFERENTIAL
BASOPHILS ABSOLUTE COUNT: 0 10*9/L
BASOPHILS RELATIVE PERCENT: 0 %
EOSINOPHILS ABSOLUTE COUNT: 0.1 10*9/L
EOSINOPHILS RELATIVE PERCENT: 3 %
HEMATOCRIT: 39.2 %
HEMOGLOBIN: 13.6 g/dL
LYMPHOCYTES ABSOLUTE COUNT: 0.9 10*9/L
LYMPHOCYTES RELATIVE PERCENT: 23 %
MEAN CORPUSCULAR HEMOGLOBIN CONC: 34.7 g/dL
MEAN CORPUSCULAR HEMOGLOBIN: 30.7 pg
MEAN CORPUSCULAR VOLUME: 89 fL
MONOCYTES ABSOLUTE COUNT: 0.4 10*9/L
NEUTROPHILS RELATIVE PERCENT: 64 %
RED BLOOD CELL COUNT: 4.43 10*12/L
RED CELL DISTRIBUTION WIDTH: 13 %
WHITE BLOOD CELL COUNT: 4 10*9/L
WHITE BLOOD CELL COUNT: 4 10*9/L

## 2018-10-14 LAB — BASIC METABOLIC PANEL
BLOOD UREA NITROGEN: 19 mg/dL
CHLORIDE: 104 mmol/L
CO2: 18 mmol/L — ABNORMAL LOW
CREATININE: 0.79 mg/dL
EGFR MDRD AF AMER: 106 mL/min/{1.73_m2}
EGFR MDRD NON AF AMER: 92 mL/min/{1.73_m2}
GLUCOSE RANDOM: 85 mg/dL
SODIUM: 139 mmol/L

## 2018-10-14 MED ORDER — FAMOTIDINE 20 MG TABLET
ORAL_TABLET | Freq: Every day | ORAL | 6 refills | 0 days | Status: CP | PRN
Start: 2018-10-14 — End: 2018-10-30

## 2018-10-14 NOTE — Unmapped (Signed)
Pt currently taking Ranitidine, asked pt stop medication due to re-call. New prescription for Famotidine 20mg  daily PRN sent to [facility] per pt requestion. Pt made aware of medication change. Pt also called c/o B ankle swelling, pt has seen PCP and they are not treating the swelling at this time. Followed up with Dr. Margaretmary Bayley, no interventions at this time. Pt will continue to monitor and call back if worsens.

## 2018-10-20 ENCOUNTER — Encounter: Payer: Self-pay | Admitting: Orthopaedic Surgery

## 2018-10-30 MED ORDER — FAMOTIDINE 20 MG TABLET
ORAL_TABLET | Freq: Every day | ORAL | 6 refills | 0 days | Status: CP | PRN
Start: 2018-10-30 — End: 2019-10-30

## 2018-10-30 NOTE — Unmapped (Signed)
[facility] Pharm[facility] Refill Coordination Note    Specialty Medication(s) to be Shipped:   Transplant: cyclosporine 25mg    Non-Specialty Medication: Famotidine 20mg      Gina Hart, DOB: 1976-01-13  Phone: [phone] (home)     All above HIPAA information was verified with patient.     Completed refill call assessment today to schedule patient's medication shipment from the [facility] [phone]).       Specialty medication(s) and dose(s) confirmed: Regimen is correct and unchanged.   Changes to medications: Gina Hart reports no changes reported at this time.  Changes to insurance: No  Questions for the pharmacist: No    Confirmed patient received Welcome Packet with first shipment. The patient will receive a drug information handout for each medication shipped and additional FDA Medication Guides as required.       DISEASE/MEDICATION-SPECIFIC INFORMATION        N/A    SPECIALTY MEDICATION ADHERENCE     Medication Adherence    Patient reported X missed doses in the last month:  0  Specialty Medication:  Cyclosporine 25mg   Patient is on additional specialty medications:  No  Patient is on more than two specialty medications:  No        Cyclosporine 25 mg: 10 days of medicine on hand     SHIPPING     Shipping address confirmed in Epic.     Delivery Scheduled: Yes, Expected medication delivery date: 11/08/2018.     Medication will be delivered via UPS to the home address in [place].    Gina Hart   [facility] Shared [facility] Specialty Technician

## 2018-11-07 MED FILL — CYCLOSPORINE MODIFIED 25 MG CAPSULE: 30 days supply | Qty: 300 | Fill #3 | Status: AC

## 2018-11-07 MED FILL — CYCLOSPORINE MODIFIED 25 MG CAPSULE: 30 days supply | Qty: 300 | Fill #3

## 2018-11-08 NOTE — Unmapped (Signed)
Pt called asking for advice on diet supplements. TNC encouraged pt to eat a healthy diet and exercise daily. Pt wanted to know about detox teas, TNC stated unsure if safe for kidney and discouraged use. Pt also asked about cool sculpting and thermal wraps, TNC stated unsure if safe for kidney will follow up with Dr Margaretmary Bayley. Again encouraged pt to try a healthy diet, less sugar and exercise. Pt verbalized understanding.

## 2018-11-19 NOTE — Unmapped (Signed)
Pt called asking to have lab orders sent to [facility], called health center they do not accept outside orders. Updated orders sent to [facility]. Pt made aware.

## 2018-11-22 NOTE — Unmapped (Signed)
Received message from [facility], famotidine on back order. Reviewed with Dr. Margaretmary Bayley, pt can take Prilosec OTC. Pt made aware and verbalized understanding.

## 2018-11-27 ENCOUNTER — Encounter: Admit: 2018-11-27 | Discharge: 2018-11-28 | Payer: MEDICARE | Attending: Nephrology | Primary: Nephrology

## 2018-11-27 LAB — PHOSPHORUS
Lab: 3.6
PHOSPHORUS: 3.6 mg/dL

## 2018-11-27 LAB — CBC W/ DIFFERENTIAL
BASOPHILS ABSOLUTE COUNT: 0 10*9/L — ABNORMAL LOW
BASOPHILS RELATIVE PERCENT: 0.3 %
EOSINOPHILS ABSOLUTE COUNT: 0.1 10*9/L
EOSINOPHILS RELATIVE PERCENT: 2.9 %
HEMATOCRIT: 41.3 %
HEMOGLOBIN: 14.3 g/dL
LYMPHOCYTES ABSOLUTE COUNT: 0.8 10*9/L — ABNORMAL LOW
LYMPHOCYTES RELATIVE PERCENT: 19.5 % — ABNORMAL LOW
MEAN CORPUSCULAR HEMOGLOBIN CONC: 34.5 g/dL
MEAN CORPUSCULAR HEMOGLOBIN: 31.2 pg
MEAN CORPUSCULAR VOLUME: 90.2 fL
MEAN PLATELET VOLUME: 7.9 fL
MONOCYTES ABSOLUTE COUNT: 0.4 10*9/L
MONOCYTES RELATIVE PERCENT: 10.4 %
NEUTROPHILS ABSOLUTE COUNT: 2.7 10*9/L
NEUTROPHILS RELATIVE PERCENT: 66.9 %
PLATELET COUNT: 219 10*9/L
RED BLOOD CELL COUNT: 4.6 10*12/L
RED CELL DISTRIBUTION WIDTH: 13.3 %

## 2018-11-27 LAB — BASIC METABOLIC PANEL
BLOOD UREA NITROGEN: 19 mg/dL — ABNORMAL HIGH
CALCIUM: 9.8 mg/dL
CHLORIDE: 103 mmol/L
CREATININE: 0.8 mg/dL
EGFR MDRD AF AMER: 94 mL/min/{1.73_m2}
GLUCOSE RANDOM: 90 mg/dL
POTASSIUM: 4.4 mmol/L

## 2018-11-27 LAB — MAGNESIUM: Lab: 1.7 — ABNORMAL LOW

## 2018-11-27 LAB — CREATININE: Lab: 0.8

## 2018-11-27 LAB — HEMOGLOBIN: Lab: 14.3

## 2018-11-27 NOTE — Unmapped (Signed)
Transplant Nephrology Clinic Visit    Reason for Visit  A??video??visit was performed today due to COVID-19 pandemic??associated clinic restrictions. She??is??evaluated for??follow-up of??her kidney??transplant, immunosuppression management, COVID-19 education,??and management of other medical problems.??  ??  Assessment and Plan  Gina Hart is a 43 y.o. female who is s/p deceased donor kidney transplant on Jan 20, 2017. She presents today in follow up after diagnoses of influenza A on 2/20 and RSV on 09/10/18. Active medical issues include:    1. Influenza A followed by RSV infection, symptoms resolved. Her son has indirect recent exposure to COVID-19 and lives in her home. He is asymptomatic. Patient is aware that should she or any family member develop COVID -19 symptoms that she will call us.      2. Status post deceased donor kidney transplant on 01-20-2017 with no history of rejection or evidence of FSGS recurrence. Renal function has been stable with serum creatinine 0.80 mg/dL (baseline <1.6 mg/dL). UP/C has been historically normal. DSA screen was negative on 06/21/18 though class II could not be checked due to an interfering substance.    3. Immunosuppression. Currently on cyclosporine 125 mg BID, myfortic 180 BID, and prednisone 5 mg daily. Cyclosporine level on 10/10/18 was 201 (target 125-200). I will continue the current regimen.     4. Hypertension. On amlodipine 10mg  daily and metoprolol tartrate 100mg  bid. BP is 118/68 today.  Will continue current antihypertensives.      5. Headaches, improved off tacrolimus.     6. Spinal stenosis, cervical. She is [facility] for management.      7. Health Maintenance. Will defer pneumococcal vaccines until next in-person visit.       8. Memory Loss/Confusion. Brain MRI 07/06/18 was normal. Symptoms have improved after switching to cyclosporine from tacrolimus.    9. Follow up in 3 months.     History of Present Illness  Gina Hart is a 43 y.o. female who is s/p deceased kidney transplant on 01/20/2017. She has experienced no episodes of rejection with normal urinary protein and baseline creatinine <1.0 mg/dL.     She??is evaluated today by video visit due to COVID-19 restrictions.??She was diagnosed with influenza A 08/15/18 and RSV 09/10/18 and treated with doxycline for a post-viral pneumonia. Cough and shortness of breath symptoms have now almost completely resolved. She denies fever, nausea, vomiting, diarrhea, altered sense of taste or smell, skin rash, nasal congestion, sore throat, chills, fatigue, or myalgias.??COVID-19 testing earlier this year was negative. Her son works in a [employer] where a person was diagnosed with COVID-19 last week though that worker was on a different shift and had no direct contact. Her son lives with her and he has not developed any COVID-19 symptoms.  She denies chest pain or edema. She denies dysuria or allograft tenderness. She has not been checking blood pressures at home. She continues to have left leg pain and back pain.     She remains on cyclosporine 125 mg bid and notes less headache since coming off tacrolimus. She is now experiencing gingival hyperplasia and hirsutism. Short term memory problems have improved on cyclosporine.     Transplant History:  1. 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  2. S/P kidney transplant at [facility] 2107-01-11. Deceased donor with KDPI 19%. CMV D+/R-; EBV D+/R+  3. Immunosuppression = campath induction followed by tacrolimus and myfortic maintenance therapy, steroid free regimen.   4. Tacrolimus changed to cyclosporine 07/31/2017 due to neurotoxicity with memory  deficits and headaches as primary manifestation.     Past Medical History:  1. ESRD secondary to FSGS.   2. Hypertension  3. Scoliosis  S/P kidney transplant as stated above    Review of Systems    All other systems are reviewed and are negative.    Medications  Current Outpatient Medications   Medication Sig Dispense Refill   ??? acetaminophen (TYLENOL) 325 MG tablet Take 1-2 tablets (325-650 mg total) by mouth every four (4) hours as needed for pain. 100 tablet 0   ??? amLODIPine (NORVASC) 5 MG tablet TAKE (2) TABLETS BY MOUTH ONCE DAILY. 60 tablet 10   ??? aspirin (ECOTRIN) 81 MG tablet TAKE 1 TABLET (81 MG) BY MOUTH DAILY 30 tablet 0   ??? cyclobenzaprine (FLEXERIL) 10 MG tablet Take 1 tablet (10 mg total) by mouth two (2) times a day as needed for muscle spasms. (Patient not taking: Reported on 10/02/2017) 45 tablet 0   ??? cycloSPORINE modified (NEORAL) 25 MG capsule TAKE 5 CAPSULES (125MG ) BY MOUTH TWICE DAILY 300 each 11   ??? famotidine (PEPCID) 20 MG tablet Take 1 tablet (20 mg total) by mouth daily as needed for heartburn. 30 tablet 6   ??? fluticasone propionate (FLONASE) 50 mcg/actuation nasal spray 1 spray by Each Nare route daily. 16 g 4   ??? gabapentin (NEURONTIN) 100 MG capsule Take 2 capsules (200 mg total) by mouth Three (3) times a day. (Patient not taking: Reported on 01/02/2018) 180 capsule 11   ??? loratadine (CLARITIN) 10 mg tablet Take 10 mg by mouth daily as needed for allergies.     ??? metoprolol tartrate (LOPRESSOR) 100 MG tablet Take 1 tablet (100 mg total) by mouth Two (2) times a day. 180 tablet 3   ??? MYFORTIC 180 mg EC tablet Take 1 tablet (180 mg total) by mouth Two (2) times a day. 60 tablet 11   ??? predniSONE (DELTASONE) 5 MG tablet Take 1 tablet (5 mg total) by mouth daily. 30 tablet 11   ??? sertraline (ZOLOFT) 100 MG tablet Take 1 tablet (100 mg total) by mouth daily. 90 tablet 3   ??? traMADol (ULTRAM) 50 mg tablet Take 50 mg by mouth every six (6) hours as needed for pain.     ??? traZODone (DESYREL) 50 MG tablet TAKE 1 TABLET BY MOUTH ONCE NIGHTLY 90 tablet 11     No current facility-administered medications for this visit.        Physical Exam    No exam was done    Laboratory Results  No results found for this or any previous visit (from the past 170 hour(s)).    Video Visit:    I spent 25 minutes on the audio/video with the patient. I spent an additional 20 minutes on pre- and post-visit activities.     The patient was physically located in [place] or a state in which I am permitted to provide care. The patient and/or parent/gauardian understood that s/he may incur co-pays and cost sharing, and agreed to the telemedicine visit. The visit was completed via phone and/or video, which was appropriate and reasonable under the circumstances given the patient's presentation at the time.    The patient and/or parent/guardian has been advised of the potential risks and limitations of this mode of treatment (including, but not limited to, the absence of in-person examination) and has agreed to be treated using telemedicine. The patient's/patient's family's questions regarding telemedicine have been answered.  If the phone/video visit was completed in an ambulatory setting, the patient and/or parent/guardian has also been advised to contact their provider???s office for worsening conditions, and seek emergency medical treatment and/or call 911 if the patient deems either necessary.

## 2018-11-27 NOTE — Unmapped (Signed)
Transplant Coordinator, Clinic Visit   Pt feeling well overall, has started taking immune boosting vitamins: Immuniti/Goli gummies.   Assessment  BP: pt does not check BP regularly, only when she feels like it could be high or low  Headache: no  Hand tremors: no  Numbness/tingling: no  Fevers: no  Chills/sweats: no  Shortness of breath: no  Chest pain or pressure: no  Palpitations: no  Nausea/vomiting: no  Diarrhea/constipation: no  UTI symptoms: no  Swelling: B ankles    Immunosuppressant last taken: 10-11pm    I spent a total of minutes with Gina Hart reviewing medications and symptoms.

## 2018-11-27 NOTE — Unmapped (Signed)
Per Dr. Margaretmary Bayley pt needs leave of absence letter from work until August. Letter sent to pt via My Chart.

## 2018-11-28 NOTE — Unmapped (Signed)
[facility] Pharmacy Refill Coordination Note    Specialty Medication(s) to be Shipped:   Transplant: cyclosporine 25mg      Gina Hart, DOB: July 02, 1975  Phone: [phone] (home)     All above HIPAA information was verified with patient.     Completed refill call assessment today to schedule patient's medication shipment from the [facility] [phone]).       Specialty medication(s) and dose(s) confirmed: Regimen is correct and unchanged.   Changes to medications: Gina Hart reports no changes reported at this time.  Changes to insurance: No  Questions for the pharmacist: No    Confirmed patient received Welcome Packet with first shipment. The patient will receive a drug information handout for each medication shipped and additional FDA Medication Guides as required.       DISEASE/MEDICATION-SPECIFIC INFORMATION        N/A    SPECIALTY MEDICATION ADHERENCE     Medication Adherence    Patient reported X missed doses in the last month:  1  Specialty Medication:  Cyclosporine 25mg   Patient is on additional specialty medications:  No  Informant:  patient  Reasons for non-adherence:  patient forgets        Cyclosporine 25 mg: 8 days of medicine on hand     SHIPPING     Shipping address confirmed in Epic.     Delivery Scheduled: Yes, Expected medication delivery date: 12/05/2018.     Medication will be delivered via UPS to the home address in Epic [place].    Gina Hart   [facility] Shared [facility] Specialty Technician

## 2018-12-04 MED FILL — CYCLOSPORINE MODIFIED 25 MG CAPSULE: 30 days supply | Qty: 300 | Fill #4

## 2018-12-04 MED FILL — CYCLOSPORINE MODIFIED 25 MG CAPSULE: 30 days supply | Qty: 300 | Fill #4 | Status: AC

## 2018-12-25 NOTE — Unmapped (Signed)
Patient does not need a refill of specialty medication at this time. Moving specialty refill reminder call to appropriate date and removed call attempt data.  Spoke with patient and she states she still has enough medication (Cyclosporine) to possibly last her 3 weeks. Patient confirmed she has not missed any doses or started any new medications.

## 2018-12-26 NOTE — Unmapped (Signed)
Pt request for RX Refill

## 2019-01-01 MED ORDER — TRAZODONE 50 MG TABLET
ORAL_TABLET | 0 refills | 0 days | Status: CP
Start: 2019-01-01 — End: 2020-01-01

## 2019-01-02 LAB — MAGNESIUM: Lab: 1.7 — ABNORMAL LOW

## 2019-01-02 LAB — BASIC METABOLIC PANEL
CALCIUM: 9.6 mg/dL
CHLORIDE: 103 mmol/L
CO2: 24.3 mmol/L
CREATININE: 0.9 mg/dL
EGFR MDRD AF AMER: 82 mL/min/{1.73_m2}
GLUCOSE RANDOM: 91 mg/dL
POTASSIUM: 3.8 mmol/L
SODIUM: 136 mmol/L

## 2019-01-02 LAB — CBC W/ DIFFERENTIAL
BASOPHILS ABSOLUTE COUNT: 0 10*9/L — ABNORMAL LOW
EOSINOPHILS ABSOLUTE COUNT: 0.1 10*9/L
EOSINOPHILS RELATIVE PERCENT: 3.2 %
HEMOGLOBIN: 13.9 g/dL
LYMPHOCYTES ABSOLUTE COUNT: 0.6 10*9/L — ABNORMAL LOW
LYMPHOCYTES RELATIVE PERCENT: 16.4 % — ABNORMAL LOW
MEAN CORPUSCULAR HEMOGLOBIN CONC: 34.9 g/dL
MEAN CORPUSCULAR HEMOGLOBIN: 31.2 pg
MEAN CORPUSCULAR VOLUME: 89.3 fL
MEAN PLATELET VOLUME: 8.4 fL
MONOCYTES RELATIVE PERCENT: 15.4 % — ABNORMAL HIGH
NEUTROPHILS ABSOLUTE COUNT: 2.3 10*9/L
NEUTROPHILS RELATIVE PERCENT: 64.4 %
PLATELET COUNT: 217 10*9/L
RED BLOOD CELL COUNT: 4.5 10*12/L
RED CELL DISTRIBUTION WIDTH: 12.8 %
WBC ADJUSTED: 3.6 10*9/L — ABNORMAL LOW

## 2019-01-02 LAB — EGFR MDRD AF AMER: Lab: 82

## 2019-01-02 LAB — PHOSPHORUS: Lab: 3.1

## 2019-01-02 LAB — NUCLEATED RED BLOOD CELLS: Lab: 0

## 2019-01-06 LAB — CYCLOSPORINE, TROUGH: Lab: 165

## 2019-01-08 MED ORDER — METOPROLOL TARTRATE 100 MG TABLET
ORAL_TABLET | Freq: Two times a day (BID) | ORAL | 3 refills | 90.00000 days | Status: CP
Start: 2019-01-08 — End: 2020-01-08

## 2019-01-10 NOTE — Unmapped (Signed)
[facility] Refill Coordination Note    Specialty Medication(s) to be Shipped:   Transplant: cyclosporine 25mg     Other medication(s) to be shipped: none     Gina Hart, DOB: 1976-01-19  Phone: [phone] (home)       All above HIPAA information was verified with patient.     Completed refill call assessment today to schedule patient's medication shipment from the [facility] [phone]).       Specialty medication(s) and dose(s) confirmed: Regimen is correct and unchanged.   Changes to medications: [name] reports no changes at this time.  Changes to insurance: No  Questions for the pharmacist: No    Confirmed patient received Welcome Packet with first shipment. The patient will receive a drug information handout for each medication shipped and additional FDA Medication Guides as required.       DISEASE/MEDICATION-SPECIFIC INFORMATION        N/A    SPECIALTY MEDICATION ADHERENCE     Medication Adherence    Patient reported X missed doses in the last month: 0  Specialty Medication: Cyclosporine 25mg   Patient is on additional specialty medications: No            cyclosporine 25mg : 10 days worth of on hand.        SHIPPING     Shipping address confirmed in Epic.     Delivery Scheduled: Yes, Expected medication delivery date: 01/14/19.     Medication will be delivered via UPS to the home address in Epic [place].    [name]   [facility] Shared [facility] Specialty Technician

## 2019-01-13 MED FILL — CYCLOSPORINE MODIFIED 25 MG CAPSULE: 30 days supply | Qty: 300 | Fill #5

## 2019-01-13 MED FILL — CYCLOSPORINE MODIFIED 25 MG CAPSULE: 30 days supply | Qty: 300 | Fill #5 | Status: AC

## 2019-02-04 NOTE — Unmapped (Signed)
[facility] Shared [facility] Clinical Assessment & Refill Coordination Note    Gina Hart, DOB: 06-20-1976  Phone: [phone] (home)     All above HIPAA information was verified with patient.     Specialty Medication(s):   Transplant: cyclosporine 25mg      Current Outpatient Medications   Medication Sig Dispense Refill   ??? acetaminophen (TYLENOL) 325 MG tablet Take 1-2 tablets (325-650 mg total) by mouth every four (4) hours as needed for pain. 100 tablet 0   ??? amLODIPine (NORVASC) 5 MG tablet TAKE (2) TABLETS BY MOUTH ONCE DAILY. (Patient taking differently: Take 10 mg by mouth nightly. Hold for SBP < 90 and/or MAP < 70) 60 tablet 10   ??? aspirin (ECOTRIN) 81 MG tablet TAKE 1 TABLET (81 MG) BY MOUTH DAILY 30 tablet 0   ??? cyclobenzaprine (FLEXERIL) 10 MG tablet Take 1 tablet (10 mg total) by mouth two (2) times a day as needed for muscle spasms. (Patient not taking: Reported on 10/02/2017) 45 tablet 0   ??? cycloSPORINE modified (NEORAL) 25 MG capsule TAKE 5 CAPSULES (125MG ) BY MOUTH TWICE DAILY 300 each 11   ??? famotidine (PEPCID) 20 MG tablet Take 1 tablet (20 mg total) by mouth daily as needed for heartburn. 30 tablet 6   ??? fluticasone propionate (FLONASE) 50 mcg/actuation nasal spray 1 spray by Each Nare route daily. 16 g 4   ??? gabapentin (NEURONTIN) 100 MG capsule Take 2 capsules (200 mg total) by mouth Three (3) times a day. (Patient not taking: Reported on 01/02/2018) 180 capsule 11   ??? loratadine (CLARITIN) 10 mg tablet Take 10 mg by mouth daily as needed for allergies.     ??? metoprolol tartrate (LOPRESSOR) 100 MG tablet Take 1 tablet (100 mg total) by mouth Two (2) times a day. 180 tablet 3   ??? MYFORTIC 180 mg EC tablet Take 1 tablet (180 mg total) by mouth Two (2) times a day. 60 tablet 11   ??? predniSONE (DELTASONE) 5 MG tablet Take 1 tablet (5 mg total) by mouth daily. 30 tablet 11   ??? sertraline (ZOLOFT) 100 MG tablet Take 1 tablet (100 mg total) by mouth daily. 90 tablet 3   ??? traMADol (ULTRAM) 50 mg tablet Take 50 mg by mouth every six (6) hours as needed for pain.     ??? traZODone (DESYREL) 50 MG tablet TAKE 1 TABLET BY MOUTH ONCE NIGHTLY 90 tablet 0     No current facility-administered medications for this visit.         Changes to medications: Ravina reports no changes at this time.    Allergies   Allergen Reactions   ??? Ibuprofen Swelling     Other reaction(s): SWELLING/EDEMA       Changes to allergies: No    SPECIALTY MEDICATION ADHERENCE     Cyclosporine 25 mg: 13 days of medicine on hand     Medication Adherence    Patient reported X missed doses in the last month: 0  Specialty Medication: Cyclosporine 25mg   Patient is on additional specialty medications: No          Specialty medication(s) dose(s) confirmed: Regimen is correct and unchanged.     Are there any concerns with adherence? No    Adherence counseling provided? Not needed    CLINICAL MANAGEMENT AND INTERVENTION      Clinical Benefit Assessment:    Do you feel the medicine is effective or helping your condition? Yes  Clinical Benefit counseling provided? Not needed    Adverse Effects Assessment:    Are you experiencing any side effects? No    Are you experiencing difficulty administering your medicine? No    Quality of Life Assessment:    How many days over the past month did your kidney transplant  keep you from your normal activities? For example, brushing your teeth or getting up in the morning. 0    Have you discussed this with your provider? Not needed    Therapy Appropriateness:    Is therapy appropriate? Yes, therapy is appropriate and should be continued    DISEASE/MEDICATION-SPECIFIC INFORMATION      N/A    PATIENT SPECIFIC NEEDS     ? Does the patient have any physical, cognitive, or cultural barriers? No    ? Is the patient high risk? No     ? Does the patient require a Care Management Plan? No     ? Does the patient require physician intervention or other additional services (i.e. nutrition, smoking cessation, social work)? No      SHIPPING     Specialty Medication(s) to be Shipped:   Transplant: cyclosporine 25mg     Other medication(s) to be shipped: NONE     Changes to insurance: No    Delivery Scheduled: Yes, Expected medication delivery date: 02/14/2019.     Medication will be delivered via UPS to the confirmed home address in [facility].    The patient will receive a drug information handout for each medication shipped and additional FDA Medication Guides as required.  Verified that patient has previously received a Conservation officer, historic buildings.    All of the patient's questions and concerns have been addressed.    Tera Helper   [facility] Specialty Pharmacist

## 2019-02-06 LAB — CBC W/ DIFFERENTIAL
BASOPHILS ABSOLUTE COUNT: 0 10*9/L — ABNORMAL LOW
BASOPHILS RELATIVE PERCENT: 0.6 %
EOSINOPHILS ABSOLUTE COUNT: 0.1 10*9/L
EOSINOPHILS RELATIVE PERCENT: 3.4 %
HEMATOCRIT: 40.1 %
HEMOGLOBIN: 13.7 g/dL
LYMPHOCYTES ABSOLUTE COUNT: 0.9 10*9/L — ABNORMAL LOW
LYMPHOCYTES RELATIVE PERCENT: 21 %
MEAN CORPUSCULAR HEMOGLOBIN CONC: 34.2 g/dL
MEAN CORPUSCULAR HEMOGLOBIN: 30.8 pg
MEAN CORPUSCULAR VOLUME: 90.1 fL
MEAN PLATELET VOLUME: 8.3 fL
MONOCYTES ABSOLUTE COUNT: 0.4 10*9/L
MONOCYTES RELATIVE PERCENT: 10.6 %
NEUTROPHILS ABSOLUTE COUNT: 2.6 10*9/L
NEUTROPHILS RELATIVE PERCENT: 64.4 %
RED BLOOD CELL COUNT: 4.5 10*12/L
RED CELL DISTRIBUTION WIDTH: 13 %

## 2019-02-06 LAB — EGFR CKD-EPI NON-AA FEMALE: Lab: 0

## 2019-02-06 LAB — BASIC METABOLIC PANEL
BLOOD UREA NITROGEN: 17 mg/dL
CALCIUM: 9.5 mg/dL
CHLORIDE: 105 mmol/L
CO2: 25.6 mmol/L
GLUCOSE RANDOM: 95 mg/dL
POTASSIUM: 4 mmol/L
SODIUM: 140 mmol/L

## 2019-02-06 LAB — PHOSPHORUS: Lab: 3.6

## 2019-02-06 LAB — MAGNESIUM: Lab: 1.7 — ABNORMAL LOW

## 2019-02-06 LAB — MACROCYTES: Lab: 0

## 2019-02-06 MED ORDER — SERTRALINE 100 MG TABLET
ORAL_TABLET | Freq: Every day | ORAL | 3 refills | 90 days | Status: CP
Start: 2019-02-06 — End: 2020-02-06

## 2019-02-06 NOTE — Unmapped (Signed)
Pt called for refill of Zoloft. Prescription sent to [facility].

## 2019-02-11 LAB — CYCLOSPORINE, TROUGH
CYCLOSPORINE, TROUGH: 238 ng/mL (ref 100–400)
Lab: 238

## 2019-02-12 LAB — CYCLOSPORINE, TROUGH: Lab: 238

## 2019-02-13 MED FILL — CYCLOSPORINE MODIFIED 25 MG CAPSULE: 30 days supply | Qty: 300 | Fill #6 | Status: AC

## 2019-02-13 MED FILL — CYCLOSPORINE MODIFIED 25 MG CAPSULE: 30 days supply | Qty: 300 | Fill #6

## 2019-02-27 DIAGNOSIS — Z940 Kidney transplant status: Secondary | ICD-10-CM

## 2019-02-27 MED ORDER — MYFORTIC 180 MG TABLET,DELAYED RELEASE
ORAL_TABLET | 11 refills | 0 days | Status: CP
Start: 2019-02-27 — End: ?

## 2019-02-27 NOTE — Unmapped (Signed)
Transplant Nephrology Clinic Visit      ??  Assessment and Plan  Gina Hart is a 43 y.o. female who is s/p deceased donor kidney transplant on 2017-01-20. She is evaluated today by video visit. Active medical issues include:    1. Status post deceased donor kidney transplant on January 20, 2017 with no history of rejection or evidence of FSGS recurrence. Renal function has been stable with serum creatinine 0.80 mg/dL on 1/61 (baseline <0.9 mg/dL). UP/C has been historically normal. DSA screen was negative on 06/21/18 though class II could not be checked due to an interfering substance.    3. Immunosuppression. Currently on cyclosporine 125 mg BID, myfortic 180 BID, and prednisone 5 mg daily. Cyclosporine level on 02/06/2019 was 238 (target 125-200). I will continue the current regimen.     4. Hypertension. On amlodipine 10mg  daily and metoprolol tartrate 100mg  bid. Will continue current antihypertensives.      5. Headaches, improved off tacrolimus.     6. Spinal stenosis, cervical. She is following with the [facility] for management.      7. Health Maintenance. Will defer pneumococcal vaccines until next in-person visit. Patient advised to get a flu vaccination.     8. Memory Loss/Confusion. Brain MRI 07/06/18 was normal. Symptoms initially improved after switching to cyclosporine from tacrolimus, but appear now to be persistent. Will hold trazodone.     9. Follow up in 3 months.     History of Present Illness  Gina Hart is a 43 y.o. female who is s/p deceased kidney transplant on 2017-01-20. She has experienced no episodes of rejection with normal urinary protein and baseline creatinine <1.0 mg/dL.     She presents today with complaint of a 3 week history of increased lower extremity edema. She denies chest pain or shortness of breath. She also complains of continued fogginess with regard to her memory. Headaches are now infrequent. The headaches improved after switching from tacrolimus to cyclosporine, but her memory deficits remain. She denies exposure to COVID-19 infected individuals. She has recovered from her diagnoses of influenza A on 08/15/18 and RSV on 09/10/18 and denies cough, shortness of breath or chest pain. She denies fever, nausea, vomiting, diarrhea, altered sense of taste or smell, skin rash, nasal congestion, sore throat, chills, fatigue, or myalgias.??She denies dysuria or allograft tenderness. Blood pressure has been 130's/70s.     Transplant History:  1. 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  2. S/P kidney transplant at [facility] January 11, 2107. Deceased donor with KDPI 19%. CMV D+/R-; EBV D+/R+  3. Immunosuppression = campath induction followed by tacrolimus and myfortic maintenance therapy, steroid free regimen.   4. Tacrolimus changed to cyclosporine 07/31/2017 due to neurotoxicity with memory deficits and headaches as primary manifestation.     Past Medical History:  1. ESRD secondary to FSGS.   2. Hypertension  3. Scoliosis  S/P kidney transplant as stated above    Review of Systems    All other systems are reviewed and are negative.    Medications  Current Outpatient Medications   Medication Sig Dispense Refill   ??? acetaminophen (TYLENOL) 325 MG tablet Take 1-2 tablets (325-650 mg total) by mouth every four (4) hours as needed for pain. 100 tablet 0   ??? amLODIPine (NORVASC) 5 MG tablet TAKE (2) TABLETS BY MOUTH ONCE DAILY. (Patient taking differently: Take 10 mg by mouth nightly. Hold for SBP < 90 and/or MAP < 70) 60 tablet 10   ??? aspirin (ECOTRIN)  81 MG tablet TAKE 1 TABLET (81 MG) BY MOUTH DAILY 30 tablet 0   ??? cyclobenzaprine (FLEXERIL) 10 MG tablet Take 1 tablet (10 mg total) by mouth two (2) times a day as needed for muscle spasms. (Patient not taking: Reported on 10/02/2017) 45 tablet 0   ??? cycloSPORINE modified (NEORAL) 25 MG capsule TAKE 5 CAPSULES (125MG ) BY MOUTH TWICE DAILY 300 each 11   ??? famotidine (PEPCID) 20 MG tablet Take 1 tablet (20 mg total) by mouth daily as needed for heartburn. 30 tablet 6   ??? fluticasone propionate (FLONASE) 50 mcg/actuation nasal spray 1 spray by Each Nare route daily. 16 g 4   ??? gabapentin (NEURONTIN) 100 MG capsule Take 2 capsules (200 mg total) by mouth Three (3) times a day. (Patient not taking: Reported on 01/02/2018) 180 capsule 11   ??? loratadine (CLARITIN) 10 mg tablet Take 10 mg by mouth daily as needed for allergies.     ??? metoprolol tartrate (LOPRESSOR) 100 MG tablet Take 1 tablet (100 mg total) by mouth Two (2) times a day. 180 tablet 3   ??? MYFORTIC 180 mg EC tablet Take 1 tablet (180 mg total) by mouth Two (2) times a day. 60 tablet 11   ??? predniSONE (DELTASONE) 5 MG tablet Take 1 tablet (5 mg total) by mouth daily. 30 tablet 11   ??? sertraline (ZOLOFT) 100 MG tablet Take 1 tablet (100 mg total) by mouth daily. 90 tablet 3   ??? traMADol (ULTRAM) 50 mg tablet Take 50 mg by mouth every six (6) hours as needed for pain.     ??? traZODone (DESYREL) 50 MG tablet TAKE 1 TABLET BY MOUTH ONCE NIGHTLY 90 tablet 0     No current facility-administered medications for this visit.        Physical Exam    No exam was done    Laboratory Results  No results found for this or any previous visit (from the past 170 hour(s)).    Video Visit:    I spent 12 minutes on the audio/video with the patient. I spent an additional 15 minutes on pre- and post-visit activities.     The patient was physically located in [place] or a state in which I am permitted to provide care. The patient and/or parent/gauardian understood that s/he may incur co-pays and cost sharing, and agreed to the telemedicine visit. The visit was completed via phone and/or video, which was appropriate and reasonable under the circumstances given the patient's presentation at the time.    The patient and/or parent/guardian has been advised of the potential risks and limitations of this mode of treatment (including, but not limited to, the absence of in-person examination) and has agreed to be treated using telemedicine. The patient's/patient's family's questions regarding telemedicine have been answered.     If the phone/video visit was completed in an ambulatory setting, the patient and/or parent/guardian has also been advised to contact their provider???s office for worsening conditions, and seek emergency medical treatment and/or call 911 if the patient deems either necessary.

## 2019-02-28 ENCOUNTER — Telehealth: Admit: 2019-02-28 | Discharge: 2019-03-01 | Payer: MEDICARE | Attending: Nephrology | Primary: Nephrology

## 2019-02-28 DIAGNOSIS — I10 Essential (primary) hypertension: Secondary | ICD-10-CM

## 2019-02-28 DIAGNOSIS — D899 Disorder involving the immune mechanism, unspecified: Secondary | ICD-10-CM

## 2019-03-13 NOTE — Unmapped (Signed)
[facility] Refill Coordination Note    Specialty Medication(s) to be Shipped:   Transplant: cyclosporine 25mg     Other medication(s) to be shipped: N/A     Gina Hart, DOB: 02/06/1976  Phone: [phone] (home)       All above HIPAA information was verified with patient.     Completed refill call assessment today to schedule patient's medication shipment from the [facility] [phone]).       Specialty medication(s) and dose(s) confirmed: Regimen is correct and unchanged.   Changes to medications: Gina Hart reports no changes at this time.  Changes to insurance: No  Questions for the pharmacist: No    Confirmed patient received Welcome Packet with first shipment. The patient will receive a drug information handout for each medication shipped and additional FDA Medication Guides as required.       DISEASE/MEDICATION-SPECIFIC INFORMATION        N/A    SPECIALTY MEDICATION ADHERENCE     Medication Adherence    Patient reported X missed doses in the last month: 0  Specialty Medication: cyclosporine 25mg   Patient is on additional specialty medications: No          Cyclosporine 25 mg: 9 days of medicine on hand     SHIPPING     Shipping address confirmed in Epic.     Delivery Scheduled: Yes, Expected medication delivery date: 03/17/2019.     Medication will be delivered via UPS to the home address in Epic [place].    Gina Hart   [facility] Specialty Technician

## 2019-03-14 MED FILL — CYCLOSPORINE MODIFIED 25 MG CAPSULE: 30 days supply | Qty: 300 | Fill #7

## 2019-03-14 MED FILL — CYCLOSPORINE MODIFIED 25 MG CAPSULE: 30 days supply | Qty: 300 | Fill #7 | Status: AC

## 2019-03-20 LAB — CBC W/ DIFFERENTIAL
BASOPHILS ABSOLUTE COUNT: 0 10*9/L
BASOPHILS RELATIVE PERCENT: 0.6 %
EOSINOPHILS ABSOLUTE COUNT: 0.1 10*9/L
EOSINOPHILS RELATIVE PERCENT: 3.2 %
HEMATOCRIT: 39.9 %
HEMOGLOBIN: 13.6 g/dL
LYMPHOCYTES ABSOLUTE COUNT: 0.9 10*9/L — ABNORMAL LOW
LYMPHOCYTES RELATIVE PERCENT: 20.5 %
MEAN CORPUSCULAR HEMOGLOBIN: 30.7 pg
MEAN PLATELET VOLUME: 8.3 fL
MONOCYTES ABSOLUTE COUNT: 0.5 10*9/L
MONOCYTES RELATIVE PERCENT: 11.2 %
NEUTROPHILS ABSOLUTE COUNT: 2.7 10*9/L
NEUTROPHILS RELATIVE PERCENT: 64.5 %
PLATELET COUNT: 240 10*9/L
RED BLOOD CELL COUNT: 4.4 10*12/L
WBC ADJUSTED: 4.1 10*9/L

## 2019-03-20 LAB — BASIC METABOLIC PANEL
BLOOD UREA NITROGEN: 22 mg/dL — ABNORMAL HIGH
CALCIUM: 9.9 mg/dL
CO2: 26 mmol/L
CREATININE: 0.7 mg/dL
EGFR CKD-EPI AA FEMALE: 110 mL/min/{1.73_m2}
POTASSIUM: 4.1 mmol/L
SODIUM: 137 mmol/L

## 2019-03-20 LAB — PHOSPHORUS: Lab: 3.4

## 2019-03-20 LAB — CHLORIDE: Lab: 104

## 2019-03-20 LAB — MAGNESIUM: Lab: 1.7 — ABNORMAL LOW

## 2019-03-20 LAB — NEUTROPHILS ABSOLUTE COUNT: Lab: 2.7

## 2019-03-24 LAB — CYCLOSPORINE, TROUGH: Lab: 188

## 2019-04-07 NOTE — Unmapped (Signed)
[facility] Refill Coordination Note    Specialty Medication(s) to be Shipped:   Transplant: cyclosporine 25mg     Other medication(s) to be shipped: N/A     Gina Hart, DOB: Dec 13, 1975  Phone: [phone] (home)       All above HIPAA information was verified with patient.     Completed refill call assessment today to schedule patient's medication shipment from the [facility] [phone]).       Specialty medication(s) and dose(s) confirmed: Regimen is correct and unchanged.   Changes to medications: [place] reports no changes at this time.  Changes to insurance: No  Questions for the pharmacist: No    Confirmed patient received Welcome Packet with first shipment. The patient will receive a drug information handout for each medication shipped and additional FDA Medication Guides as required.       DISEASE/MEDICATION-SPECIFIC INFORMATION        N/A    SPECIALTY MEDICATION ADHERENCE     Medication Adherence    Patient reported X missed doses in the last month: 0  Specialty Medication: Cyclosporine 25mg   Patient is on additional specialty medications: No          Cyclosporine 25 mg: 6 days of medicine on hand     SHIPPING     Shipping address confirmed in Epic.     Delivery Scheduled: Yes, Expected medication delivery date: 04/10/2019.     Medication will be delivered via UPS to the home address in [employer] [place].    Gina Hart   [facility] Specialty Technician

## 2019-04-09 MED FILL — CYCLOSPORINE MODIFIED 25 MG CAPSULE: 30 days supply | Qty: 300 | Fill #8 | Status: AC

## 2019-04-09 MED FILL — CYCLOSPORINE MODIFIED 25 MG CAPSULE: 30 days supply | Qty: 300 | Fill #8

## 2019-05-01 LAB — CBC W/ DIFFERENTIAL
BASOPHILS RELATIVE PERCENT: 0.7 %
EOSINOPHILS ABSOLUTE COUNT: 0.1 10*9/L
EOSINOPHILS RELATIVE PERCENT: 3 %
HEMOGLOBIN: 13.6 g/dL
LYMPHOCYTES ABSOLUTE COUNT: 0.8 10*9/L — ABNORMAL LOW
LYMPHOCYTES RELATIVE PERCENT: 16.8 % — ABNORMAL LOW
MEAN CORPUSCULAR HEMOGLOBIN CONC: 34.4 g/dL
MEAN CORPUSCULAR VOLUME: 90.2 fL
MEAN PLATELET VOLUME: 8.3 fL
MONOCYTES ABSOLUTE COUNT: 0.6 10*9/L
MONOCYTES RELATIVE PERCENT: 11.4 %
NEUTROPHILS ABSOLUTE COUNT: 3.4 10*9/L
NEUTROPHILS RELATIVE PERCENT: 68.1 %
PLATELET COUNT: 225 10*9/L
RED BLOOD CELL COUNT: 4.4 10*12/L — ABNORMAL LOW
RED CELL DISTRIBUTION WIDTH: 12.8 %
WBC ADJUSTED: 5 10*9/L

## 2019-05-01 LAB — MAGNESIUM: Lab: 1.8

## 2019-05-01 LAB — BASIC METABOLIC PANEL
BLOOD UREA NITROGEN: 25 mg/dL — ABNORMAL HIGH
CALCIUM: 9.5 mg/dL
CO2: 24.7 mmol/L
CREATININE: 0.8 mg/dL
GLUCOSE RANDOM: 99 mg/dL
POTASSIUM: 4 mmol/L
SODIUM: 137 mmol/L

## 2019-05-01 LAB — PHOSPHORUS: Lab: 3.6

## 2019-05-01 LAB — CREATININE: Lab: 0.8

## 2019-05-01 LAB — MONOCYTES RELATIVE PERCENT: Lab: 11.4

## 2019-05-02 NOTE — Unmapped (Signed)
Letter sent to patient via My Chart, per request for work related excuse, note routed to primary coordinator.

## 2019-05-05 LAB — CYCLOSPORINE, TROUGH: Lab: 141

## 2019-05-13 NOTE — Unmapped (Signed)
[facility] Refill Coordination Note    Specialty Medication(s) to be Shipped:   Transplant: cyclosporine 25mg     Other medication(s) to be shipped: n/a     Gina Hart, DOB: [birth year]  Phone: [phone] (home)       All above HIPAA information was verified with patient.     Completed refill call assessment today to schedule patient's medication shipment from the [facility] [phone]).       Specialty medication(s) and dose(s) confirmed: Regimen is correct and unchanged.   Changes to medications: Gina Hart reports no changes at this time.  Changes to insurance: No  Questions for the pharmacist: No    Confirmed patient received Welcome Packet with first shipment. The patient will receive a drug information handout for each medication shipped and additional FDA Medication Guides as required.       DISEASE/MEDICATION-SPECIFIC INFORMATION        N/A    SPECIALTY MEDICATION ADHERENCE     Medication Adherence    Patient reported X missed doses in the last month: 0  Specialty Medication: Cyclosporine 25 mg  Patient is on additional specialty medications: No  Any gaps in refill history greater than 2 weeks in the last 3 months: no  Demonstrates understanding of importance of adherence: yes  Informant: patient  Reliability of informant: reliable  Confirmed plan for next specialty medication refill: delivery by pharmacy  Refills needed for supportive medications: not needed                Cyclosporine 25 mg. 10 days on hand      SHIPPING     Shipping address confirmed in Epic.     Delivery Scheduled: Yes, Expected medication delivery date: 05/15/2019.     Medication will be delivered via UPS to the prescription address in [place].    Gina Hart D Gina Hart   [facility] Specialty Technician

## 2019-05-14 MED FILL — CYCLOSPORINE MODIFIED 25 MG CAPSULE: 30 days supply | Qty: 300 | Fill #9

## 2019-05-14 MED FILL — CYCLOSPORINE MODIFIED 25 MG CAPSULE: 30 days supply | Qty: 300 | Fill #9 | Status: AC

## 2019-05-20 NOTE — Unmapped (Signed)
unable to leave message due to mailbox being full, called to fill out MSPQ for 12/10 appt with Dr Margaretmary Bayley

## 2019-06-05 ENCOUNTER — Encounter: Admit: 2019-06-05 | Discharge: 2019-06-06 | Payer: MEDICARE | Attending: Nephrology | Primary: Nephrology

## 2019-06-05 MED ORDER — TRAZODONE 50 MG TABLET
ORAL_TABLET | Freq: Every evening | ORAL | 3 refills | 90 days | Status: CP
Start: 2019-06-05 — End: 2020-06-04

## 2019-06-06 NOTE — Unmapped (Signed)
Transplant Nephrology Clinic Visit    ??  Assessment and Plan  Gina Hart is a 43 y.o. female who is s/p deceased donor kidney transplant on 04-Feb-2017. She is evaluated today by video visit. Active medical issues include:    1. Status post deceased donor kidney transplant on Feb 04, 2017 with no history of rejection or evidence of FSGS recurrence. Renal function has been stable with serum creatinine 0.80 mg/dL on 0/27 (baseline <2.5 mg/dL). UP/C has been historically normal. DSA screen was negative on 06/21/18 though class II could not be checked due to an interfering substance.    2. Sinus congestion, loss of smell and taste. Will await results of COVID-19 PCR.      3. Immunosuppression. Currently on cyclosporine 125 mg BID, myfortic 180 BID, and prednisone 5 mg daily. Cyclosporine level on 04/30/19 was 141 (target 125-200). I will continue the current regimen.     4. Hypertension, controlled.  On amlodipine 10mg  daily and metoprolol tartrate 100mg  bid. Will continue current antihypertensives.      5. Headaches, improved off tacrolimus. Now she is having a sinus headache.     6. Spinal stenosis, cervical.  She continues to have left leg pain. Will refer back to spine and pain clinic.     7. Health Maintenance. Will defer pneumococcal vaccines until next in-person visit. Patient had flu vaccine 03/2019.  We discussed the benefits versus risks of COVID-19 vaccination.     8. Memory Loss/Confusion. Brain MRI 07/06/18 was normal. Symptoms initially improved after switching to cyclosporine from tacrolimus, but appear now to be persistent. She continues to have insomnia. Will prescribe trazodone and melatonin.     9. Follow up in 3 months.     History of Present Illness  Gina Hart is a 43 y.o. female who is s/p deceased kidney transplant on February 04, 2017. She has experienced no episodes of rejection with normal urinary protein and baseline creatinine <1.0 mg/dL.     She presents today with complaint of nasal and sinus congestion. Symptoms started approximately one month ago. She has also had a recurrence of her loss of sense of taste and smell. COVID testing approximately a month ago was negative per patient. Her sister was exposed to a co-worker 8 days ago with COVID-19, but her sister has no symptoms of COVID. She sees her sister daily and therefore the patient had another COVID test yesterday with results pending. She denies cough, fever, chills, fatigue, nausea, vomiting, diarrhea, or sore throat. She denies chest pain or shortness of breath or edema. Her memory lapses are not progressive. Headaches are infrequent. She denies dysuria or allograft tenderness. Blood pressure has been normal.     Immunosuppressive medication doses are unchanged. She complains of worsening insomnia since stopping melatonin and trazodone. She also complains of left leg pain. She has not recently been seen in spine/pain clinic.     Transplant History:  1. 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  2. S/P kidney transplant at [facility] Jan 26, 2107. Deceased donor with KDPI 19%. CMV D+/R-; EBV D+/R+  3. Immunosuppression = campath induction followed by tacrolimus and myfortic maintenance therapy, steroid free regimen.   4. Tacrolimus changed to cyclosporine 07/31/2017 due to neurotoxicity with memory deficits and headaches as primary manifestation.  5. RSV infection 09/10/18     Past Medical History:  1. ESRD secondary to FSGS.   2. Hypertension  3. Scoliosis  S/P kidney transplant as stated above    Review of Systems  All other systems are reviewed and are negative.    Medications  Current Outpatient Medications   Medication Sig Dispense Refill   ??? acetaminophen (TYLENOL) 325 MG tablet Take 1-2 tablets (325-650 mg total) by mouth every four (4) hours as needed for pain. 100 tablet 0   ??? amLODIPine (NORVASC) 5 MG tablet TAKE (2) TABLETS BY MOUTH ONCE DAILY. (Patient taking differently: Take 10 mg by mouth nightly. Hold for SBP < 90 and/or MAP < 70) 60 tablet 10   ??? aspirin (ECOTRIN) 81 MG tablet TAKE 1 TABLET (81 MG) BY MOUTH DAILY 30 tablet 0   ??? cyclobenzaprine (FLEXERIL) 10 MG tablet Take 1 tablet (10 mg total) by mouth two (2) times a day as needed for muscle spasms. (Patient not taking: Reported on 10/02/2017) 45 tablet 0   ??? cycloSPORINE modified (NEORAL) 25 MG capsule TAKE 5 CAPSULES (125MG ) BY MOUTH TWICE DAILY 300 each 11   ??? famotidine (PEPCID) 20 MG tablet Take 1 tablet (20 mg total) by mouth daily as needed for heartburn. 30 tablet 6   ??? fluticasone propionate (FLONASE) 50 mcg/actuation nasal spray 1 spray by Each Nare route daily. 16 g 4   ??? gabapentin (NEURONTIN) 100 MG capsule Take 2 capsules (200 mg total) by mouth Three (3) times a day. (Patient not taking: Reported on 01/02/2018) 180 capsule 11   ??? loratadine (CLARITIN) 10 mg tablet Take 10 mg by mouth daily as needed for allergies.     ??? metoprolol tartrate (LOPRESSOR) 100 MG tablet Take 1 tablet (100 mg total) by mouth Two (2) times a day. 180 tablet 3   ??? MYFORTIC 180 mg EC tablet TAKE 1 TABLET BY MOUTH TWICE DAILY 60 tablet 11   ??? predniSONE (DELTASONE) 5 MG tablet Take 1 tablet (5 mg total) by mouth daily. 30 tablet 11   ??? sertraline (ZOLOFT) 100 MG tablet Take 1 tablet (100 mg total) by mouth daily. 90 tablet 3   ??? traMADol (ULTRAM) 50 mg tablet Take 50 mg by mouth every six (6) hours as needed for pain.     ??? traZODone (DESYREL) 50 MG tablet TAKE 1 TABLET BY MOUTH ONCE NIGHTLY 90 tablet 0     No current facility-administered medications for this visit.        Physical Exam    Patient appears in no distress    Laboratory Results  No results found for this or any previous visit (from the past 170 hour(s)).    Video Visit:    I spent 20 minutes on the audio/video with the patient. I spent an additional 20 minutes on pre- and post-visit activities.     The patient was physically located in [place] or a state in which I am permitted to provide care. The patient and/or parent/gauardian understood that s/he may incur co-pays and cost sharing, and agreed to the telemedicine visit. The visit was completed via phone and/or video, which was appropriate and reasonable under the circumstances given the patient's presentation at the time.    The patient and/or parent/guardian has been advised of the potential risks and limitations of this mode of treatment (including, but not limited to, the absence of in-person examination) and has agreed to be treated using telemedicine. The patient's/patient's family's questions regarding telemedicine have been answered.     If the phone/video visit was completed in an ambulatory setting, the patient and/or parent/guardian has also been advised to contact their provider???s office for worsening conditions, and seek emergency  medical treatment and/or call 911 if the patient deems either necessary.

## 2019-06-06 NOTE — Unmapped (Signed)
left message, called to scheduled 3 month follow up with Dr Margaretmary Bayley, can be scheduled from the recall tab

## 2019-06-09 DIAGNOSIS — Z79899 Other long term (current) drug therapy: Principal | ICD-10-CM

## 2019-06-09 DIAGNOSIS — M79606 Pain in leg, unspecified: Principal | ICD-10-CM

## 2019-06-09 DIAGNOSIS — Z940 Kidney transplant status: Principal | ICD-10-CM

## 2019-06-09 NOTE — Unmapped (Signed)
Per Dr. Margaretmary Bayley, pt needs annual labs ordered, referral to [facility] for [facility] pain and results from recent COVID-19 test negative.

## 2019-06-10 ENCOUNTER — Ambulatory Visit: Admit: 2019-06-10 | Discharge: 2019-06-11 | Payer: MEDICARE

## 2019-06-10 LAB — COMPREHENSIVE METABOLIC PANEL
ALBUMIN: 4.2 g/dL (ref 3.5–5.0)
ALKALINE PHOSPHATASE: 111 U/L (ref 38–126)
ALT (SGPT): 50 U/L — ABNORMAL HIGH (ref <35)
AST (SGOT): 31 U/L (ref 14–38)
BILIRUBIN TOTAL: 0.6 mg/dL (ref 0.0–1.2)
BLOOD UREA NITROGEN: 23 mg/dL — ABNORMAL HIGH (ref 7–21)
BUN / CREAT RATIO: 32
CALCIUM: 10.3 mg/dL — ABNORMAL HIGH (ref 8.5–10.2)
CHLORIDE: 110 mmol/L — ABNORMAL HIGH (ref 98–107)
CO2: 25 mmol/L (ref 22.0–30.0)
CREATININE: 0.73 mg/dL (ref 0.60–1.00)
EGFR CKD-EPI AA FEMALE: >90 mL/min/{1.73_m2} (ref >=60)
EGFR CKD-EPI NON-AA FEMALE: >90 mL/min/{1.73_m2} (ref >=60)
GLUCOSE RANDOM: 90 mg/dL (ref 70–99)
POTASSIUM: 4.3 mmol/L (ref 3.5–5.0)
PROTEIN TOTAL: 7.6 g/dL (ref 6.5–8.3)
SODIUM: 140 mmol/L (ref 135–145)

## 2019-06-10 LAB — URINALYSIS
BACTERIA: NONE SEEN /HPF
BILIRUBIN UA: NEGATIVE
GLUCOSE UA: NEGATIVE
KETONES UA: NEGATIVE
LEUKOCYTE ESTERASE UA: NEGATIVE
PROTEIN UA: NEGATIVE
SPECIFIC GRAVITY UA: 1.025 (ref 1.005–1.040)
SQUAMOUS EPITHELIAL: 5 /HPF (ref 0–5)
UROBILINOGEN UA: 0.2
WBC UA: <1 /HPF (ref 0–5)

## 2019-06-10 LAB — CBC W/ AUTO DIFF
BASOPHILS ABSOLUTE COUNT: 0 10*9/L (ref 0.0–0.1)
BASOPHILS RELATIVE PERCENT: 0.6 %
EOSINOPHILS RELATIVE PERCENT: 2.2 %
HEMATOCRIT: 38.1 % (ref 35.0–44.0)
HEMOGLOBIN: 13.2 g/dL (ref 12.0–15.5)
LYMPHOCYTES RELATIVE PERCENT: 18.3 %
MEAN CORPUSCULAR HEMOGLOBIN CONC: 34.7 g/dL (ref 30.0–36.0)
MEAN CORPUSCULAR HEMOGLOBIN: 30.8 pg (ref 26.0–34.0)
MEAN CORPUSCULAR VOLUME: 88.7 fL (ref 82.0–98.0)
MEAN PLATELET VOLUME: 8.1 fL (ref 7.0–10.0)
MONOCYTES ABSOLUTE COUNT: 0.6 10*9/L (ref 0.1–1.0)
MONOCYTES RELATIVE PERCENT: 13.1 %
NEUTROPHILS RELATIVE PERCENT: 65.8 %
PLATELET COUNT: 272 10*9/L (ref 150–450)
RED BLOOD CELL COUNT: 4.29 10*12/L (ref 3.90–5.03)
RED CELL DISTRIBUTION WIDTH: 12.6 % (ref 12.0–15.0)
WBC ADJUSTED: 4.9 10*9/L (ref 3.5–10.5)

## 2019-06-10 LAB — LIPID PANEL
CHOLESTEROL/HDL RATIO SCREEN: 3.8
CHOLESTEROL: 269 mg/dL — ABNORMAL HIGH (ref 100–199)
HDL CHOLESTEROL: 71 mg/dL — ABNORMAL HIGH (ref 40–59)
TRIGLYCERIDES: 72 mg/dL (ref 1–149)

## 2019-06-10 LAB — MEAN PLATELET VOLUME: Platelet mean volume:EntVol:Pt:Bld:Qn:Automated count: 8.1

## 2019-06-10 LAB — PARATHYROID HORMONE INTACT: Parathyrin.intact:MCnc:Pt:Ser/Plas:Qn:: 117.9 — ABNORMAL HIGH

## 2019-06-10 LAB — COVID-19 IGG: SARS coronavirus 2 Ab.IgG:PrThr:Pt:Ser/Plas:Ord:IA: NEGATIVE

## 2019-06-10 LAB — PROTEIN / CREATININE RATIO, URINE: CREATININE, URINE: 80.6 mg/dL

## 2019-06-10 LAB — PROTEIN/CREAT RATIO, URINE: Protein/Creatinine:MRto:Pt:Urine:Qn:: 0.073

## 2019-06-10 LAB — BILIRUBIN TOTAL: Bilirubin:MCnc:Pt:Ser/Plas:Qn:: 0.6

## 2019-06-10 LAB — PROTEIN UA: Protein:MCnc:Pt:Urine:Qn:Test strip: NEGATIVE

## 2019-06-10 LAB — LDL CHOLESTEROL CALCULATED: Cholesterol.in LDL:MCnc:Pt:Ser/Plas:Qn:Calculated: 184

## 2019-06-10 LAB — MAGNESIUM: Magnesium:MCnc:Pt:Ser/Plas:Qn:: 1.7

## 2019-06-10 LAB — ESTIMATED AVERAGE GLUCOSE: Estimated average glucose:MCnc:Pt:Bld:Qn:Estimated from glycated hemoglobin: 103

## 2019-06-10 LAB — PHOSPHORUS: Phosphate:MCnc:Pt:Ser/Plas:Qn:: 3.8

## 2019-06-11 LAB — CYCLOSPORINE, TROUGH: Lab: 130

## 2019-06-12 DIAGNOSIS — R779 Abnormality of plasma protein, unspecified: Principal | ICD-10-CM

## 2019-06-12 DIAGNOSIS — Z79899 Other long term (current) drug therapy: Principal | ICD-10-CM

## 2019-06-12 DIAGNOSIS — D47Z1 Post-transplant lymphoproliferative disorder (PTLD): Principal | ICD-10-CM

## 2019-06-12 DIAGNOSIS — Z940 Kidney transplant status: Principal | ICD-10-CM

## 2019-06-12 DIAGNOSIS — B2700 Gammaherpesviral mononucleosis without complication: Principal | ICD-10-CM

## 2019-06-12 DIAGNOSIS — T8699 Other complications of unspecified transplanted organ and tissue: Principal | ICD-10-CM

## 2019-06-12 LAB — EBV QUANTITATIVE PCR, BLOOD
EBV QUANT LOG: 3.95 {Log_IU}/mL — ABNORMAL HIGH (ref <0.00)
EBV QUANT: 9010 [IU]/mL — ABNORMAL HIGH (ref <0)

## 2019-06-12 LAB — CMV QUANT: Lab: 0

## 2019-06-12 LAB — CMV DNA, QUANTITATIVE, PCR

## 2019-06-12 LAB — EBV VIRAL LOAD RESULT: Lab: 0

## 2019-06-12 NOTE — Unmapped (Signed)
I discussed the EBV viral load finding with the patient. She still has sinus congestion but denies fever, adenopathy, GI symptoms, or other findings of active EBV infection. We will plan a PET CT scan.

## 2019-06-15 LAB — VITAMIN D, TOTAL (25OH): Lab: 14.2 — ABNORMAL LOW

## 2019-06-16 ENCOUNTER — Other Ambulatory Visit: Payer: Self-pay | Admitting: Nephrology

## 2019-06-16 DIAGNOSIS — R779 Abnormality of plasma protein, unspecified: Secondary | ICD-10-CM

## 2019-06-16 DIAGNOSIS — B2700 Gammaherpesviral mononucleosis without complication: Secondary | ICD-10-CM

## 2019-06-16 DIAGNOSIS — Z940 Kidney transplant status: Secondary | ICD-10-CM

## 2019-06-16 DIAGNOSIS — C8300 Small cell B-cell lymphoma, unspecified site: Secondary | ICD-10-CM

## 2019-06-16 NOTE — Unmapped (Signed)
[facility] Pharm[facility] Refill Coordination Note    Specialty Medication(s) to be Shipped:   Transplant: cyclosporine 25mg     Other medication(s) to be shipped: n/a     Gina Hart, DOB: 1976/03/17  Phone: [phone] (home)       All above HIPAA information was verified with patient.     Was a Nurse, learning disability used for this call? No    Completed refill call assessment today to schedule patient's medication shipment from the [facility] [phone]).       Specialty medication(s) and dose(s) confirmed: Regimen is correct and unchanged.   Changes to medications: Gina Hart reports no changes at this time.  Changes to insurance: No  Questions for the pharmacist: No    Confirmed patient received Welcome Packet with first shipment. The patient will receive a drug information handout for each medication shipped and additional FDA Medication Guides as required.       DISEASE/MEDICATION-SPECIFIC INFORMATION        N/A    SPECIALTY MEDICATION ADHERENCE     Medication Adherence    Patient reported X missed doses in the last month: 0  Specialty Medication: Cyclosporine 25mg   Informant: patient                Cyclosporine 25 mg:7 days of medicine on hand         SHIPPING     Shipping address confirmed in Epic.     Delivery Scheduled: Yes, Expected medication delivery date: 06/18/19.     Medication will be delivered via UPS to the prescription address in Epic [place].    Gina Hart   [facility] Specialty Technician

## 2019-06-16 NOTE — Unmapped (Signed)
Pt requesting testing completed locally, [facility] only location doing testing. Order faxed [phone]. Pt made aware.

## 2019-06-17 MED FILL — CYCLOSPORINE MODIFIED 25 MG CAPSULE: 30 days supply | Qty: 300 | Fill #10

## 2019-06-17 MED FILL — CYCLOSPORINE MODIFIED 25 MG CAPSULE: 30 days supply | Qty: 300 | Fill #10 | Status: AC

## 2019-06-18 DIAGNOSIS — Z940 Kidney transplant status: Principal | ICD-10-CM

## 2019-06-18 LAB — FSAB CLASS 1 ANTIBODY SPECIFICITY

## 2019-06-18 LAB — HLA DS POST TRANSPLANT
ANTI-DONOR HLA-A #1 MFI: 53 MFI
ANTI-DONOR HLA-A #2 MFI: 91 MFI
ANTI-DONOR HLA-B #1 MFI: 49 MFI
ANTI-DONOR HLA-B #2 MFI: 131 MFI
ANTI-DONOR HLA-C #1 MFI: 70 MFI

## 2019-06-18 LAB — HLA CL2 AB RESULT: Lab: POSITIVE

## 2019-06-18 LAB — DONOR HLA-A ANTIGEN #2

## 2019-06-18 LAB — FSAB CLASS 2 ANTIBODY SPECIFICITY

## 2019-06-18 LAB — HLA CLASS 1 ANTIBODY RESULT: Lab: POSITIVE

## 2019-06-18 MED ORDER — MYFORTIC 180 MG TABLET,DELAYED RELEASE
ORAL_TABLET | 11 refills | 0 days | Status: CP
Start: 2019-06-18 — End: ?

## 2019-06-18 NOTE — Unmapped (Addendum)
Per Dr. Margaretmary Bayley, pt is to STOP her Myfortic at this time. Pt made aware. Pt called and made T[facility] aware she scheduled her PET CT for Dec. 30th at [facility].    Pt mentioned she has L ear congestion and L nose congestion over past month. Advised pt to try Coricidin for congestion and Afrin spray. Pt verbalized understanding.

## 2019-06-25 ENCOUNTER — Other Ambulatory Visit: Payer: Self-pay

## 2019-06-25 ENCOUNTER — Ambulatory Visit
Admission: RE | Admit: 2019-06-25 | Discharge: 2019-06-25 | Disposition: A | Discharge status: Home / Self Care | Payer: Medicare Other | Source: Ambulatory Visit | Attending: Nephrology | Admitting: Nephrology

## 2019-06-25 DIAGNOSIS — C8300 Small cell B-cell lymphoma, unspecified site: Secondary | ICD-10-CM

## 2019-06-25 DIAGNOSIS — B2700 Gammaherpesviral mononucleosis without complication: Secondary | ICD-10-CM | POA: Insufficient documentation

## 2019-06-25 DIAGNOSIS — R779 Abnormality of plasma protein, unspecified: Secondary | ICD-10-CM

## 2019-06-25 DIAGNOSIS — Z981 Arthrodesis status: Secondary | ICD-10-CM | POA: Diagnosis not present

## 2019-06-25 DIAGNOSIS — Z7952 Long term (current) use of systemic steroids: Secondary | ICD-10-CM | POA: Diagnosis not present

## 2019-06-25 DIAGNOSIS — Z940 Kidney transplant status: Secondary | ICD-10-CM | POA: Diagnosis not present

## 2019-06-25 LAB — GLUCOSE, CAPILLARY: Glucose-Capillary: 77 mg/dL (ref 70–99)

## 2019-06-25 MED ORDER — FLUDEOXYGLUCOSE F - 18 (FDG) INJECTION
10.1600 | Freq: Once | INTRAVENOUS | Status: AC | PRN
  Administered 2019-06-25: 10.16 via INTRAVENOUS

## 2019-07-01 DIAGNOSIS — Z79899 Other long term (current) drug therapy: Principal | ICD-10-CM

## 2019-07-01 DIAGNOSIS — Z940 Kidney transplant status: Principal | ICD-10-CM

## 2019-07-01 NOTE — Unmapped (Signed)
Dr. Margaretmary Bayley reviewed pt PET CT from 06/25/2019 in Care Everywhere, referral to ENT ordered for evaluation of symmetric intense hypermetabolic in thickened lingual tonsils. Pt made aware.

## 2019-07-07 NOTE — Unmapped (Signed)
Otolaryngology-Head and Neck Surgery Consult Note    Date of Service:  July 09, 2019  Requesting Attending Physician: Clayborne Artist, *  Reason for Consult:  Symmetric intense hypermetabolic activity in thickened lingual tonsils.      Assessment:  1. Enlarged tonsils    2. Obstructive sleep apnea    3. Kidney replaced by transplant    4. Encounter for long-term (current) use of medications        44 y.o. female with outside PET imaging that showed hypermetabolic activity in thickened lingual tonsils. Imaging was not available for my review. I have requested for imaging to be sent here at [facility] for a formal radiological read. FFL showed enlarged, but benign appearing, lingual tonsils bilaterally.No obvious masses.     Plan:  - Without outside PET imaging from 06/25/19 for my official review, we discussed a differential diagnosis of enlarged lingual tissue as posible inflammation. Will request the PET imaging here at [facility]  - Patient had congested nasal mucosa and enlarged IT on exam today. She does endorse mouth breathing and symptoms of nasal congestion.  Recommended that she use nasal sprays (flonase) QD for 1 month. Discussed daily and proper use of Flonase with her.  If symptoms persist, we discussed the possible need for a turbinate reduction in the future.   - Patient reports a prior history of sleep apnea. She does snore and does have episodes gasping for air. There is a question on physical exam whether or not the patient has had a UPPP in the past but the patient does not remember if shes had one or not. The patient is not currently using a CPAP machine and does not follow with anyone for her reported OSA. I recommended that she get a repeat sleep study and follow up with sleep medicine for her possible OSA.   - Follow up with me PRN  - The patient will call with any questions or concerns.     Chief Complaint   Patient presents with   ??? new patient     new diagnosis       History of Present Illness: Gina Hart is a 44 y.o. African-American pleasant female being seen in consultation at the request of Dr. Margaretmary Bayley for evaluation and opinion of PET CT results from 06/25/19 which showed symmetric intense hypermetabolic activity in thickened lingual tonsils. Patient has a PMH of a right kidney transplant on 01/11/17 which is currently being managed by her nephrologist Dr. Margaretmary Bayley. Her last visit to nephrology on 06/05/19 she c/o nasal and sinus congestion persistent for 1 month prior. She further stated that she had no sense of taste or smell. COVID-19 test results were negative as of 06/10/19.  Dr. Margaretmary Bayley discussed abnormal lab findings regarding EBV viral load with the patient and planned for a PET CT which was performed on 06/25/19 at the medical mall at [facility]. Results showed symmetric intense hypermetabolic activity in thickened lingual tonsils.     Today, the patient presents to clinic and endorses a sore throat with loss of hearing in her left ear that began a few days before her recent PET scan. This has since resolved. When she wakes in the morning, she notices a change in color of phlegm that she clears from her nose/throat. She mentioned that she as had a sleep study in the past which reported that she has sleep apnea but does not use a CPAP machine. Furthermore, she mentioned that she snores which wakes herself  up sometimes. She had a negative COVID test but endorses loss of smell/taste. Denies fever, dysphagia. Denies smoking/alcohol usage.     Modifying factors:  none  Other associated symptoms: Change in color of phlegm that she clears from her nose/throat in the morning.  Severity is rated as mild    The patient denies fevers, weight loss, SOB, pain, dysphagia, odynophagia, hoarseness, hemoptysis, otalgia, or neck masses/growths.      Past Medical History    Tonsillectomy as a teenager    Past Medical History:   Diagnosis Date   ??? ESRD (end stage renal disease) (CMS-HCC)    ??? Focal segmental glomerulosclerosis 1998   ??? Hypertension    ??? Pulmonary hypertension (CMS-HCC)    ??? Scoliosis (and kyphoscoliosis), idiopathic          Past Surgical History    Past Surgical History:   Procedure Laterality Date   ??? BACK SURGERY     ??? CHG US GUIDE, VASCULAR ACCESS Right 07/11/2013    Procedure: ULTRASOUND GUIDANCE FOR VASC ACCESS REQUIRING [place] EVAL OF POTENTIAL ACCESS SITES;  Surgeon: Alben Deeds, MD;  Location: MAIN OR [facility];  Service: Vascular   ??? PR AV ANAST,FOREARM VEIN TRANSPOSITION Right 07/11/2013    Procedure: R. UE BRACHIOCEPHALIC FISTULA;  Surgeon: Alben Deeds, MD;  Location: MAIN OR [facility];  Service: Vascular   ??? PR AV ANAST,UP ARM BASILIC VEIN TRANSPOSIT Right 07/11/2013    Procedure: BASILIC VEIN TRANSPOSITION;  Surgeon: Alben Deeds, MD;  Location: MAIN OR [facility];  Service: Vascular   ??? PR RIGHT HEART CATH O2 SATURATION & CARDIAC OUTPUT N/A 06/16/2015    Procedure: Right Heart Catheterization;  Surgeon: Alvira Philips, MD;  Location: [facility] EP;  Service: Cardiology   ??? PR TRANSPLANTATION OF KIDNEY Right 01/11/2017    Procedure: RENAL ALLOTRANSPLANTATION, IMPLANTATION OF GRAFT; WITHOUT RECIPIENT NEPHRECTOMY;  Surgeon: Leona Carry, MD;  Location: MAIN OR [facility];  Service: Transplant         Medications      Current Outpatient Medications   Medication Sig Dispense Refill   ??? acetaminophen (TYLENOL) 325 MG tablet Take 1-2 tablets (325-650 mg total) by mouth every four (4) hours as needed for pain. 100 tablet 0   ??? amLODIPine (NORVASC) 5 MG tablet TAKE (2) TABLETS BY MOUTH ONCE DAILY. (Patient taking differently: Take 10 mg by mouth nightly. Hold for SBP < 90 and/or MAP < 70) 60 tablet 10   ??? cycloSPORINE modified (NEORAL) 25 MG capsule TAKE 5 CAPSULES (125MG ) BY MOUTH TWICE DAILY 300 each 11   ??? fluticasone propionate (FLONASE) 50 mcg/actuation nasal spray 1 spray by Each Nare route daily. 16 g 4   ??? loratadine (CLARITIN) 10 mg tablet Take 10 mg by mouth daily as needed for allergies.     ??? metoprolol tartrate (LOPRESSOR) 100 MG tablet Take 1 tablet (100 mg total) by mouth Two (2) times a day. 180 tablet 3   ??? predniSONE (DELTASONE) 5 MG tablet Take 1 tablet (5 mg total) by mouth daily. 30 tablet 11   ??? sertraline (ZOLOFT) 100 MG tablet Take 1 tablet (100 mg total) by mouth daily. 90 tablet 3   ??? traZODone (DESYREL) 50 MG tablet Take 1 tablet (50 mg total) by mouth nightly. 90 tablet 3   ??? aspirin (ECOTRIN) 81 MG tablet TAKE 1 TABLET (81 MG) BY MOUTH DAILY 30 tablet 0   ??? cyclobenzaprine (FLEXERIL) 10 MG tablet Take 1 tablet (10 mg total) by  mouth two (2) times a day as needed for muscle spasms. (Patient not taking: Reported on 10/02/2017) 45 tablet 0   ??? famotidine (PEPCID) 20 MG tablet Take 1 tablet (20 mg total) by mouth daily as needed for heartburn. 30 tablet 6   ??? gabapentin (NEURONTIN) 100 MG capsule Take 2 capsules (200 mg total) by mouth Three (3) times a day. (Patient not taking: Reported on 01/02/2018) 180 capsule 11   ??? MYFORTIC 180 mg EC tablet HOLD (Patient not taking: Reported on 07/09/2019) 60 tablet 11   ??? traMADol (ULTRAM) 50 mg tablet Take 50 mg by mouth every six (6) hours as needed for pain.       No current facility-administered medications for this visit.          Allergies    Ibuprofen      Social History:    Tobacco use:  reports that she has never smoked. She has never used smokeless tobacco.  Alcohol use:  reports no history of alcohol use.  Drug use:  reports no history of drug use.      Family History    The patient's family history includes Hypertension in her mother; No Known Problems in her daughter, father, maternal grandfather, maternal grandmother, paternal grandfather, paternal grandmother, and sister..      Review of Systems  A 10 system review of systems was negative except as noted in HPI and intake encounter form, which was reviewed and scanned into the media section of the medical record      Objective     Vital Signs  BP 130/82 (BP Site: L Arm, BP Position: Sitting, BP Cuff Size: Small)  - Pulse 74  - Temp 36.6 ??C (97.9 ??F) (Temporal)  - SpO2 98%       Physical Exam    Vitals signs reviewed in the nursing chart  General:  WD, WN, well groomed, pleasant female, sitting up in NAD, normal appearance and voice, communicates appropriately, no stridor.  Psychiatric/Neuro:  A&Ox3, Normal mood and affect, Following commands, MAE. Cranial nerves 2-12 intact, No focal deficits.   Head and Face:  AT, [place], no asymmetry, Skin with no masses or lesions, sinuses nontender to palpation.  Parotid and submandibular glands normal bilaterally.  Facial strength normal bilaterally.   Eyes:  EOM Intact, sclera anicteric, no conjunctival injection, PERRL.   Nose:  Normal external nasal pyramid, anterior rhinoscopy shows normal  mucosa, septum, and turbinates.   Ears:  Normal auricles, EAC's, and TM's to otoscopy.  TM's intact and mobile, Middle ears aerated bilaterally.  Hearing:  Normal hearing to whispered voice.  Oral Cavity:   Normal lips, gingiva, tongue, hard palate, and dentition; pink, moist mucosa, no lesions, tongue is mobile.  Floor of mouth is soft.  Oropharynx:  Congestion of inferior turbinate L>R. Surgical absence of uvula and tonsils. No masses palpated. No lesions. BOT soft, soft palate and posterior pharynx, symmetric pink, moist mucosa  Neck:  No masses, no thyroid nodules, no thyromegaly, trachea midline. Normal laryngeal crepitus.  Lymphatics:  No cervical lymphadenopathy.  Respiratory:  Normal respiratory effort, symmetric chest rise, no accessory muscle usage.  Cardiovascular:  No clubbing, cyanosis, swelling, edema or varicosities in extremities, Regular Rate.      Laryngoscopy  Procedure Note    Pre-operative Diagnosis:   1. Enlarged tonsils    2. Obstructive sleep apnea    3. Kidney replaced by transplant    4. Encounter for long-term (current) use of medications  Post-operative Diagnosis: same    Anesthesia: none    Surgeon: Epimenio Sarin. Melisse Caetano Endoscopy Type:  Flexible Fiberoptic Laryngoscopy    Laryngoscope E (serial number [id number]) was used during this visit on July 09, 2019.     Indications: To better evaluate the patient???s tumor site    Procedure Details:    The patient was placed in the sitting position.  After topical anesthesia and decongestion, the 4 mm laryngoscope was passed.  The nasal cavities, nasopharynx, oropharynx, hypopharynx, and larynx were all examined.  Vocal cords were examined during respiration and phonation.  The following findings were noted:    Findings:  Nasal cavities:  Enlarged inferior turbinates L>R. Normal mucosa, patent, no masses, lesions, and septum. Congested nasal mucosa.  Nasopharynx:  Normal pink moist mucosa, normal eustachian tubes, no masses.  Oropharynx:  Symmericly enlarged lingual tonsils. Tonsils filling the vallecula and slightly causing a retroflexed epiglottis. No obvious masses. Normal palate, pharyngeal wall, and base of tongue, mucosa pink and moist  Hypopharynx:  Normal pharyngeal walls and pyriform sinuses, no pooling of secretions  Larynx:  Supraglottis, false and true vocal cord are normal.  Vocal cord mobility is normal.  Subglottis is patent.    Condition:  Stable.  Patient tolerated procedure well.    Complications:  None                        Test Results    none      Imaging    I have personally reviewed all pertinent imaging results.    06/25/19 PET CT Skull Base to Thigh:  IMPRESSION:  1. No hypermetabolic or enlarged lymph nodes skull base to thigh by FDG PET scan  2. Symmetric intense hypermetabolic in thickened lingual tonsils. Recommend clinical correlation  3. Normal spleen and marrow  4. Renal transplant in the right iliac fossa      Scribe's Attestation:  Cheryle Horsfall, MD obtained and performed the history, physical exam and medical decision making elements that were entered into the chart. Signed by Janina Mayo, Scribe, on July 09, 2019 at 9:10 AM. ----------------------------------------------------------------------------------------------------------------------  July 17, 2019 9:14 AM. Documentation assistance provided by the Scribe. I was present during the time the encounter was recorded. The information recorded by the Scribe was done at my direction and has been reviewed and validated by me.  ----------------------------------------------------------------------------------------------------------------------

## 2019-07-07 NOTE — Unmapped (Signed)
[facility] Clinical Assessment & Refill Coordination Note    Gina Hart, DOB: 13-Nov-1975  Phone: [phone] (home)     All above HIPAA information was verified with patient.     Was a Nurse, learning disability used for this call? No    Specialty Medication(s):   Transplant: cyclosporine 25mg      Current Outpatient Medications   Medication Sig Dispense Refill   ??? acetaminophen (TYLENOL) 325 MG tablet Take 1-2 tablets (325-650 mg total) by mouth every four (4) hours as needed for pain. 100 tablet 0   ??? amLODIPine (NORVASC) 5 MG tablet TAKE (2) TABLETS BY MOUTH ONCE DAILY. (Patient taking differently: Take 10 mg by mouth nightly. Hold for SBP < 90 and/or MAP < 70) 60 tablet 10   ??? aspirin (ECOTRIN) 81 MG tablet TAKE 1 TABLET (81 MG) BY MOUTH DAILY 30 tablet 0   ??? cyclobenzaprine (FLEXERIL) 10 MG tablet Take 1 tablet (10 mg total) by mouth two (2) times a day as needed for muscle spasms. (Patient not taking: Reported on 10/02/2017) 45 tablet 0   ??? cycloSPORINE modified (NEORAL) 25 MG capsule TAKE 5 CAPSULES (125MG ) BY MOUTH TWICE DAILY 300 each 11   ??? famotidine (PEPCID) 20 MG tablet Take 1 tablet (20 mg total) by mouth daily as needed for heartburn. 30 tablet 6   ??? fluticasone propionate (FLONASE) 50 mcg/actuation nasal spray 1 spray by Each Nare route daily. 16 g 4   ??? gabapentin (NEURONTIN) 100 MG capsule Take 2 capsules (200 mg total) by mouth Three (3) times a day. (Patient not taking: Reported on 01/02/2018) 180 capsule 11   ??? loratadine (CLARITIN) 10 mg tablet Take 10 mg by mouth daily as needed for allergies.     ??? metoprolol tartrate (LOPRESSOR) 100 MG tablet Take 1 tablet (100 mg total) by mouth Two (2) times a day. 180 tablet 3   ??? MYFORTIC 180 mg EC tablet HOLD 60 tablet 11   ??? predniSONE (DELTASONE) 5 MG tablet Take 1 tablet (5 mg total) by mouth daily. 30 tablet 11   ??? sertraline (ZOLOFT) 100 MG tablet Take 1 tablet (100 mg total) by mouth daily. 90 tablet 3 ??? traMADol (ULTRAM) 50 mg tablet Take 50 mg by mouth every six (6) hours as needed for pain.     ??? traZODone (DESYREL) 50 MG tablet Take 1 tablet (50 mg total) by mouth nightly. 90 tablet 3     No current facility-administered medications for this visit.         Changes to medications: myfortic still being held per pt and epic    Allergies   Allergen Reactions   ??? Ibuprofen Swelling     Other reaction(s): SWELLING/EDEMA       Changes to allergies: No    SPECIALTY MEDICATION ADHERENCE     Cyclosporine 25mg   : 12 days of medicine on hand     Medication Adherence    Patient reported X missed doses in the last month: 0  Specialty Medication: cyclosporine 25mg           Specialty medication(s) dose(s) confirmed: per pt and verified per epic, not taking myfortic     Are there any concerns with adherence? No    Adherence counseling provided? Not needed    CLINICAL MANAGEMENT AND INTERVENTION      Clinical Benefit Assessment:    Do you feel the medicine is effective or helping your condition? Yes    Clinical Benefit counseling  provided? Not needed    Adverse Effects Assessment:    Are you experiencing any side effects? No    Are you experiencing difficulty administering your medicine? No    Quality of Life Assessment:    How many days over the past month did your transplant  keep you from your normal activities? For example, brushing your teeth or getting up in the morning. 0    Have you discussed this with your provider? Not needed    Therapy Appropriateness:    Is therapy appropriate? Yes, therapy is appropriate and should be continued    DISEASE/MEDICATION-SPECIFIC INFORMATION      N/A    PATIENT SPECIFIC NEEDS     ? Does the patient have any physical, cognitive, or cultural barriers? No    ? Is the patient high risk? no, pt not currently taking rems drug     ? Does the patient require a Care Management Plan? No ? Does the patient require physician intervention or other additional services (i.e. nutrition, smoking cessation, social work)? No      SHIPPING     Specialty Medication(s) to be Shipped:   Transplant: cyclosporine 25mg     Other medication(s) to be shipped: na     Changes to insurance: No    Delivery Scheduled: Yes, Expected medication delivery date: 07/16/2019.     Medication will be delivered via UPS to the confirmed prescription address in [facility].    The patient will receive a drug information handout for each medication shipped and additional FDA Medication Guides as required.  Verified that patient has previously received a Conservation officer, historic buildings.    All of the patient's questions and concerns have been addressed.    Thad Ranger   [facility] Specialty Pharmacist

## 2019-07-09 ENCOUNTER — Encounter: Admit: 2019-07-09 | Discharge: 2019-07-10 | Payer: MEDICARE

## 2019-07-09 DIAGNOSIS — Z940 Kidney transplant status: Principal | ICD-10-CM

## 2019-07-09 NOTE — Unmapped (Signed)
Vedia Pereyra: [phone]    For nursing questions please call Charline Bills or Corrin Parker, RN BSN at [phone] or send a secure message through your [facility] for a faster response.     FAX: [phone] Attn: Charline Bills    For oncology related questions, call the Nurse Navigator Fate, at [phone]    ??? For surgery scheduling please call Kerrin Mo at [phone]    For scheduling clinic appointments please call [phone].    For appointments or questions about radiology appointments please call [phone]    If you are concerned, please do not hesitate to seek medical care at your local emergency department.    After hours, please call [phone] and ask for the ENT physician on call.

## 2019-07-09 NOTE — Unmapped (Signed)
Laryngoscope serial number [id number] was used during this visit on 07/09/19.

## 2019-07-10 DIAGNOSIS — G4730 Sleep apnea, unspecified: Principal | ICD-10-CM

## 2019-07-10 DIAGNOSIS — Z940 Kidney transplant status: Principal | ICD-10-CM

## 2019-07-10 DIAGNOSIS — G4733 Obstructive sleep apnea (adult) (pediatric): Principal | ICD-10-CM

## 2019-07-10 DIAGNOSIS — Z79899 Other long term (current) drug therapy: Principal | ICD-10-CM

## 2019-07-10 NOTE — Unmapped (Signed)
Pt called, stated she needed to have a sleep study completed after seeing ENT. Order for sleep study entered. [facility]axed to [facility] in [place], [place] per pt request.

## 2019-07-11 ENCOUNTER — Encounter: Admit: 2019-07-11 | Discharge: 2019-07-12 | Payer: MEDICARE

## 2019-07-11 ENCOUNTER — Ambulatory Visit: Admit: 2019-07-11 | Discharge: 2019-07-12 | Payer: MEDICARE

## 2019-07-15 MED FILL — CYCLOSPORINE MODIFIED 25 MG CAPSULE: 30 days supply | Qty: 300 | Fill #11 | Status: AC

## 2019-07-15 MED FILL — CYCLOSPORINE MODIFIED 25 MG CAPSULE: 30 days supply | Qty: 300 | Fill #11

## 2019-07-18 DIAGNOSIS — Z940 Kidney transplant status: Principal | ICD-10-CM

## 2019-07-18 MED ORDER — AMLODIPINE 5 MG TABLET
ORAL_TABLET | Freq: Every evening | ORAL | 10 refills | 30.00000 days | Status: CP
Start: 2019-07-18 — End: ?

## 2019-07-18 NOTE — Unmapped (Signed)
Pt paged on call TNC needing amlodipine refilled. Prescription sent to [facility].     Pt would like to speak to her primary TNC regarding some appts needed. Told her I would pass along the message.     Routed message to primary TNC.

## 2019-07-21 ENCOUNTER — Other Ambulatory Visit (HOSPITAL_BASED_OUTPATIENT_CLINIC_OR_DEPARTMENT_OTHER): Payer: Self-pay

## 2019-07-21 DIAGNOSIS — G4730 Sleep apnea, unspecified: Secondary | ICD-10-CM

## 2019-07-28 NOTE — Unmapped (Signed)
Pt called requesting COVID-19 work exemption note. Sent via my chart, pt made aware.

## 2019-07-29 ENCOUNTER — Other Ambulatory Visit (HOSPITAL_COMMUNITY)
Admission: RE | Admit: 2019-07-29 | Discharge: 2019-07-29 | Disposition: A | Discharge status: Home / Self Care | Payer: Medicare Other | Source: Ambulatory Visit | Attending: Neurology | Admitting: Neurology

## 2019-08-01 LAB — HEMOGLOBIN: Lab: 13.7

## 2019-08-01 LAB — CBC W/ DIFFERENTIAL
BASOPHILS ABSOLUTE COUNT: 0 10*9/L — ABNORMAL LOW
EOSINOPHILS ABSOLUTE COUNT: 0.1 10*9/L
EOSINOPHILS RELATIVE PERCENT: 2.6 %
HEMATOCRIT: 40.3 %
HEMOGLOBIN: 13.7 g/dL
LYMPHOCYTES ABSOLUTE COUNT: 0.8 10*9/L — ABNORMAL LOW
LYMPHOCYTES RELATIVE PERCENT: 18.8 % — ABNORMAL LOW
MEAN CORPUSCULAR HEMOGLOBIN: 30.3 pg
MEAN CORPUSCULAR VOLUME: 88.9 fL
MEAN PLATELET VOLUME: 7.8 fL
MONOCYTES ABSOLUTE COUNT: 0.6 10*9/L
MONOCYTES RELATIVE PERCENT: 13.8 % — ABNORMAL HIGH
NEUTROPHILS ABSOLUTE COUNT: 2.7 10*9/L
PLATELET COUNT: 243 10*9/L
RED BLOOD CELL COUNT: 4.5 10*12/L
RED CELL DISTRIBUTION WIDTH: 12.6 %
WHITE BLOOD CELL COUNT: 4.2 10*9/L

## 2019-08-01 LAB — SODIUM: Lab: 141

## 2019-08-01 LAB — BASIC METABOLIC PANEL
BLOOD UREA NITROGEN: 20 mg/dL — ABNORMAL HIGH
CREATININE: 0.8 mg/dL
EGFR CKD-EPI AA FEMALE: 94 mL/min/{1.73_m2}
GLUCOSE RANDOM: 91 mg/dL
POTASSIUM: 3.9 mmol/L

## 2019-08-01 LAB — MAGNESIUM: Lab: 1.8

## 2019-08-01 LAB — PHOSPHORUS: Lab: 3.4

## 2019-08-04 LAB — CYCLOSPORINE, TROUGH: Lab: 173

## 2019-08-05 DIAGNOSIS — Z940 Kidney transplant status: Principal | ICD-10-CM

## 2019-08-05 MED ORDER — CYCLOSPORINE MODIFIED 25 MG CAPSULE
ORAL_CAPSULE | Freq: Two times a day (BID) | ORAL | 11 refills | 30.00000 days | Status: CP
Start: 2019-08-05 — End: 2020-08-04
  Filled 2019-08-18: qty 300, 30d supply, fill #0

## 2019-08-05 NOTE — Unmapped (Signed)
Per Dr. Margaretmary Bayley, pt needs repeat EBV Quant PCR.

## 2019-08-05 NOTE — Unmapped (Signed)
[facility] Refill Coordination Note    Specialty Medication(s) to be Shipped:   Transplant: cyclosporine 25mg  **Sent rf request**    Other medication(s) to be shipped: N/A     Gina Hart, DOB: 26-Jan-1976  Phone: [phone] (home)       All above HIPAA information was verified with patient.     Was a Nurse, learning disability used for this call? No    Completed refill call assessment today to schedule patient's medication shipment from the [facility] [phone]).       Specialty medication(s) and dose(s) confirmed: Regimen is correct and unchanged.   Changes to medications: Gina Hart reports no changes at this time.  Changes to insurance: No  Questions for the pharmacist: No    Confirmed patient received Welcome Packet with first shipment. The patient will receive a drug information handout for each medication shipped and additional FDA Medication Guides as required.       DISEASE/MEDICATION-SPECIFIC INFORMATION        N/A    SPECIALTY MEDICATION ADHERENCE     Medication Adherence    Patient reported X missed doses in the last month: 0  Specialty Medication: Cyclosporine 25mg   Patient is on additional specialty medications: No          Cyclosporine 25 mg: 14 days of medicine on hand     SHIPPING     Shipping address confirmed in Epic.     Delivery Scheduled: Yes, Expected medication delivery date: 08/19/2019.  However, Rx request for refills was sent to the provider as there are none remaining.     Medication will be delivered via UPS to the prescription address in Epic WAM.    Lorelei Pont [facility] Specialty Technician

## 2019-08-12 MED ORDER — PREDNISONE 5 MG TABLET
ORAL_TABLET | Freq: Every day | ORAL | 11 refills | 30 days | Status: CP
Start: 2019-08-12 — End: 2020-08-11

## 2019-08-13 LAB — EBV QUANTITATIVE PCR, BLOOD: EBV QUANT: 100 [IU]/mL

## 2019-08-13 LAB — EBV QUANT: Lab: 100

## 2019-08-18 MED FILL — CYCLOSPORINE MODIFIED 25 MG CAPSULE: 30 days supply | Qty: 300 | Fill #0 | Status: AC

## 2019-08-19 ENCOUNTER — Other Ambulatory Visit: Payer: Self-pay

## 2019-08-19 ENCOUNTER — Other Ambulatory Visit (HOSPITAL_COMMUNITY)
Admission: RE | Admit: 2019-08-19 | Discharge: 2019-08-19 | Disposition: A | Discharge status: Home / Self Care | Payer: Medicare Other | Source: Ambulatory Visit | Attending: Neurology | Admitting: Neurology

## 2019-08-28 LAB — BASIC METABOLIC PANEL
BLOOD UREA NITROGEN: 23 mg/dL — ABNORMAL HIGH
CHLORIDE: 102 mmol/L
CO2: 24 mmol/L
CREATININE: 0.9 mg/dL
EGFR CKD-EPI AA FEMALE: 82 mL/min/{1.73_m2}
GLUCOSE RANDOM: 88 mg/dL
POTASSIUM: 4.1 mmol/L

## 2019-08-28 LAB — PHOSPHORUS: Lab: 3.7

## 2019-08-28 LAB — CBC W/ DIFFERENTIAL
EOSINOPHILS ABSOLUTE COUNT: 0.2 10*9/L
EOSINOPHILS RELATIVE PERCENT: 4 %
HEMOGLOBIN: 13.6 g/dL
LYMPHOCYTES ABSOLUTE COUNT: 0.7 10*9/L
LYMPHOCYTES RELATIVE PERCENT: 16.5 % — ABNORMAL LOW
MEAN CORPUSCULAR HEMOGLOBIN CONC: 34.1 g/dL
MEAN CORPUSCULAR HEMOGLOBIN: 30.2 pg
MEAN CORPUSCULAR VOLUME: 88.8 fL
MEAN PLATELET VOLUME: 8.3 fL
MONOCYTES ABSOLUTE COUNT: 0.5 10*9/L
MONOCYTES RELATIVE PERCENT: 12.7 % — ABNORMAL HIGH
NEUTROPHILS ABSOLUTE COUNT: 2.6 10*9/L
NEUTROPHILS RELATIVE PERCENT: 66.2 %
PLATELET COUNT: 223 10*9/L
RED BLOOD CELL COUNT: 4.5 10*12/L
RED CELL DISTRIBUTION WIDTH: 12.8 %
WBC ADJUSTED: 4 10*9/L

## 2019-08-28 LAB — EOSINOPHILS ABSOLUTE COUNT: Lab: 0.2

## 2019-08-28 LAB — CALCIUM: Lab: 9.1

## 2019-08-28 LAB — MAGNESIUM: Lab: 1.8

## 2019-08-29 ENCOUNTER — Encounter: Admit: 2019-08-29 | Discharge: 2019-08-30 | Payer: MEDICARE | Attending: Nephrology | Primary: Nephrology

## 2019-08-29 LAB — CYCLOSPORINE, TROUGH: Lab: 201

## 2019-08-29 NOTE — Unmapped (Signed)
Transplant Nephrology Clinic Visit    ??  Assessment and Plan  Gina Hart is a 44 y.o. female who is s/p deceased donor kidney transplant on 2017/01/17. She is evaluated today by video visit. Active medical issues include:    1. Status post deceased donor kidney transplant on 17-Jan-2017 with no history of rejection or evidence of FSGS recurrence. Renal function has been stable with serum creatinine 0.90 on 08/26/19 (baseline <1.0 mg/dL). UP/C has been historically normal. DSA screen was negative on 06/09/20 though class II could not be checked due to an interfering substance.    2. EBV viremia, resolved, with enlarged tonsils. There is no clear evidence of PTLD though this remains a concern. It is reassuring that viral load has improved after Myfortic was held, and also there was less tonsillar enlargement by report on the second PET CT scan (direct comparison was not possible though). Will follow periodic EBV viral loads.     3. Immunosuppression. Currently on cyclosporine 125 mg BID and prednisone 5 mg daily. Cyclosporine level on 08/26/19 was 201 (target 125-200). Myfortic has been held due to EBV viremia.      4. Hypertension.  On amlodipine 10mg  daily and metoprolol tartrate 100mg  bid. Will continue current antihypertensives and ask patient to check home BP.      5. Headaches, improved off tacrolimus.      6. Spinal stenosis, cervical.  Will refer back to spine and pain clinic.     7. Health Maintenance. Will defer pneumococcal vaccines until next in-person visit. Patient had flu vaccine 03/2019.  We discussed the benefits versus risks of COVID-19 vaccination again today.     8. Memory Loss/Confusion. Brain MRI 07/06/18 was normal. Symptoms initially improved after switching to cyclosporine from tacrolimus, but appear now to be persistent. She continues to have insomnia. She will continue trazodone and melatonin.     9. History of obstructive sleep apnea, untreated. Will plan repeat sleep study.     10. Follow up in 3 months.     History of Present Illness  Gina Hart is a 44 y.o. female who is s/p deceased kidney transplant on 17-Jan-2017. She has experienced no episodes of rejection with normal urinary protein and baseline creatinine <1.0 mg/dL.     She was recently found to have EBV viremia (Viral load 9010 on 06/10/2019) with symptoms of nasal congestion, and loss of taste and smell. COVID-19 PCR and IgG testing were both negative. PET CT 06/25/19 revealed symmetric hypermetabolic activity in the lingual tonsils without evidence of lymphadenopathy elsewhere. ENT evaluation on 07/09/19 revealed enlarged tonsils, enlarged turbinates, and congested nasal mucosa. Repeat PET CT at [facility] on 07/11/19 revealed only slightly increased uptake in the lingual tonsils without evidence of malignancy. EBV viral load then declined to 100 by 08/07/19.     She currently denies lymphadenopathy, fever, chills, sore throat, nausea, vomiting or diarrhea. She does complain of fatigue. Headaches are improved and less frequent (1-2 x/ week). She continues to have memory deficits and insomnia. She denies dysuria or allograft tenderness. She has not been checking home BPs.      Immunosuppressive medication doses are unchanged.      Transplant History:  1. 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  2. S/P kidney transplant at [facility] January 08, 2107. Deceased donor with KDPI 19%. CMV D+/R-; EBV D+/R+  3. Immunosuppression = campath induction followed by tacrolimus and myfortic maintenance therapy, steroid free regimen.   4. Tacrolimus changed to cyclosporine  07/31/2017 due to neurotoxicity with memory deficits and headaches as primary manifestation.  5. RSV infection 09/10/18  6. EBV viremia 05/2020 (VL 9010) with increased tonsillar uptake on PET CT scans 06/24/20 and 07/11/19. No evidence of PTLD.      Past Medical History:  1. ESRD secondary to FSGS.   2. Hypertension  3. Scoliosis  S/P kidney transplant as stated above    Review of Systems    All other systems are reviewed and are negative.    Medications  Current Outpatient Medications   Medication Sig Dispense Refill   ??? acetaminophen (TYLENOL) 325 MG tablet Take 1-2 tablets (325-650 mg total) by mouth every four (4) hours as needed for pain. 100 tablet 0   ??? amLODIPine (NORVASC) 5 MG tablet Take 2 tablets (10 mg total) by mouth nightly. Hold for SBP < 90 and/or MAP < 70 60 tablet 10   ??? aspirin (ECOTRIN) 81 MG tablet TAKE 1 TABLET (81 MG) BY MOUTH DAILY 30 tablet 0   ??? cyclobenzaprine (FLEXERIL) 10 MG tablet Take 1 tablet (10 mg total) by mouth two (2) times a day as needed for muscle spasms. (Patient not taking: Reported on 10/02/2017) 45 tablet 0   ??? cycloSPORINE modified (NEORAL) 25 MG capsule Take 5 capsules (125 mg total) by mouth two (2) times a day. 300 capsule 11   ??? famotidine (PEPCID) 20 MG tablet Take 1 tablet (20 mg total) by mouth daily as needed for heartburn. 30 tablet 6   ??? fluticasone propionate (FLONASE) 50 mcg/actuation nasal spray 1 spray by Each Nare route daily. 16 g 4   ??? gabapentin (NEURONTIN) 100 MG capsule Take 2 capsules (200 mg total) by mouth Three (3) times a day. (Patient not taking: Reported on 01/02/2018) 180 capsule 11   ??? loratadine (CLARITIN) 10 mg tablet Take 10 mg by mouth daily as needed for allergies.     ??? metoprolol tartrate (LOPRESSOR) 100 MG tablet Take 1 tablet (100 mg total) by mouth Two (2) times a day. 180 tablet 3   ??? MYFORTIC 180 mg EC tablet HOLD (Patient not taking: Reported on 07/09/2019) 60 tablet 11   ??? predniSONE (DELTASONE) 5 MG tablet Take 1 tablet (5 mg total) by mouth daily. 30 tablet 11   ??? sertraline (ZOLOFT) 100 MG tablet Take 1 tablet (100 mg total) by mouth daily. 90 tablet 3   ??? traMADol (ULTRAM) 50 mg tablet Take 50 mg by mouth every six (6) hours as needed for pain.     ??? traZODone (DESYREL) 50 MG tablet Take 1 tablet (50 mg total) by mouth nightly. 90 tablet 3     No current facility-administered medications for this visit. Physical Exam    Patient appears in no distress on video visit.     Laboratory Results  Recent Results (from the past 170 hour(s))   Basic Metabolic Panel    Collection Time: 08/26/19  9:42 AM   Result Value Ref Range    Sodium 136 mmol/L    Potassium 4.1 mmol/L    Chloride 102 mmol/L    CO2 24.0 mmol/L    BUN 23 (H) mg/dL    Creatinine 1.61 mg/dL    Glucose 88 mg/dL    Calcium 9.1 mg/dL    EGFR CKD-EPI Non-African American, Female      EGFR CKD-EPI Non-African American, Female      EGFR CKD-EPI African American, Female      EGFR CKD-EPI African American, Female 5 mL/min/1.6m2  Phosphorus Level    Collection Time: 08/26/19  9:42 AM   Result Value Ref Range    Phosphorus 3.7 mg/dL   Magnesium Level    Collection Time: 08/26/19  9:42 AM   Result Value Ref Range    Magnesium 1.8 mg/dL   CBC w/ Differential    Collection Time: 08/26/19  9:42 AM   Result Value Ref Range    Results Verified by Slide Scan      WBC 4.0 10*9/L    WBC      RBC 4.50 10*12/L    HGB 13.6 g/dL    HCT 16.1 %    MCV 09.6 fL    MCH 30.2 pg    MCHC 34.1 g/dL    RDW 04.5 %    MPV 8.3 fL    Platelet 223 10*9/L    nRBC      Neutrophils % 66.2 %    Lymphocytes % 16.5 (L) %    Monocytes % 12.7 (H) %    Eosinophils % 4.0 %    Basophils % 0.6 %    Absolute Neutrophils 2.6 10*9/L    Absolute Lymphocytes 0.7 10*9/L    Absolute Monocytes 0.5 10*9/L    Absolute Eosinophils 0.2 10*9/L    Absolute Basophils 0.0 10*9/L    Microcytosis      Macrocytosis      Anisocytosis      Hyperchromasia      Hypochromasia     Cyclosporine, Trough    Collection Time: 08/26/19  9:45 AM   Result Value Ref Range    Cyclosporine, Trough 201 100 - 400 ng/mL       Video Visit:    I spent 21 minutes on the audio/video with the patient. I spent an additional 25 minutes on pre- and post-visit activities. I was present in the [facility] clinic during this visit.     The patient was physically located in [place] or a state in which I am permitted to provide care. The patient and/or parent/gauardian understood that s/he may incur co-pays and cost sharing, and agreed to the telemedicine visit. The visit was completed via phone and/or video, which was appropriate and reasonable under the circumstances given the patient's presentation at the time.    The patient and/or parent/guardian has been advised of the potential risks and limitations of this mode of treatment (including, but not limited to, the absence of in-person examination) and has agreed to be treated using telemedicine. The patient's/patient's family's questions regarding telemedicine have been answered.     If the phone/video visit was completed in an ambulatory setting, the patient and/or parent/guardian has also been advised to contact their provider???s office for worsening conditions, and seek emergency medical treatment and/or call 911 if the patient deems either necessary.

## 2019-09-01 NOTE — Unmapped (Signed)
Per requesting letter.

## 2019-09-01 NOTE — Unmapped (Signed)
Lab orders sent to [facility] per pt request.

## 2019-09-09 ENCOUNTER — Other Ambulatory Visit (HOSPITAL_COMMUNITY)
Admission: RE | Admit: 2019-09-09 | Discharge: 2019-09-09 | Disposition: A | Discharge status: Home / Self Care | Payer: Medicare Other | Source: Ambulatory Visit | Attending: Neurology | Admitting: Neurology

## 2019-09-12 DIAGNOSIS — I10 Essential (primary) hypertension: Principal | ICD-10-CM

## 2019-09-12 DIAGNOSIS — Z940 Kidney transplant status: Principal | ICD-10-CM

## 2019-09-15 DIAGNOSIS — I10 Essential (primary) hypertension: Principal | ICD-10-CM

## 2019-09-15 DIAGNOSIS — Z940 Kidney transplant status: Principal | ICD-10-CM

## 2019-09-15 MED ORDER — METOPROLOL TARTRATE 100 MG TABLET
ORAL_TABLET | Freq: Two times a day (BID) | ORAL | 3 refills | 90 days | Status: CP
Start: 2019-09-15 — End: 2020-09-14

## 2019-09-15 NOTE — Unmapped (Signed)
[facility] Refill Coordination Note    Specialty Medication(s) to be Shipped:   Transplant: cyclosporine 25mg     Other medication(s) to be shipped:       Gina Hart, DOB: [birth year]  Phone: [phone] (home)       All above HIPAA information was verified with patient.     Was a Nurse, learning disability used for this call? No    Completed refill call assessment today to schedule patient's medication shipment from the [facility] [phone]).       Specialty medication(s) and dose(s) confirmed: Regimen is correct and unchanged.   Changes to medications: Gina Hart reports no changes at this time.  Changes to insurance: No  Questions for the pharmacist: No    Confirmed patient received Welcome Packet with first shipment. The patient will receive a drug information handout for each medication shipped and additional FDA Medication Guides as required.       DISEASE/MEDICATION-SPECIFIC INFORMATION        N/A    SPECIALTY MEDICATION ADHERENCE     Medication Adherence    Patient reported X missed doses in the last month: 0  Specialty Medication: Cyclosporine 25mg   Patient is on additional specialty medications: No  Informant: patient  Reliability of informant: reliable                Cyclosporine 25 mg: 8 days of medicine on hand         SHIPPING     Shipping address confirmed in Epic.     Delivery Scheduled: Yes, Expected medication delivery date: 03/24.     Medication will be delivered via UPS to the prescription address in Epic WAM.    Antonietta Barcelona   [facility] Specialty Technician

## 2019-09-16 MED FILL — CYCLOSPORINE MODIFIED 25 MG CAPSULE: ORAL | 30 days supply | Qty: 300 | Fill #1

## 2019-09-16 MED FILL — CYCLOSPORINE MODIFIED 25 MG CAPSULE: 30 days supply | Qty: 300 | Fill #1 | Status: AC

## 2019-09-25 LAB — CBC W/ DIFFERENTIAL
BASOPHILS ABSOLUTE COUNT: 0 10*9/L — ABNORMAL LOW
BASOPHILS RELATIVE PERCENT: 0.5 %
EOSINOPHILS ABSOLUTE COUNT: 0.2 10*9/L
EOSINOPHILS RELATIVE PERCENT: 4.4 %
HEMATOCRIT: 39.4 %
HEMOGLOBIN: 13.4 g/dL
LYMPHOCYTES ABSOLUTE COUNT: 0.7 10*9/L — ABNORMAL LOW
LYMPHOCYTES RELATIVE PERCENT: 17 % — ABNORMAL LOW
MEAN CORPUSCULAR HEMOGLOBIN CONC: 34.1 g/dL
MEAN CORPUSCULAR HEMOGLOBIN: 30.1 pg
MEAN CORPUSCULAR VOLUME: 55.4 fL
MEAN PLATELET VOLUME: 7.9 fL
MONOCYTES ABSOLUTE COUNT: 0.6 10*9/L
MONOCYTES RELATIVE PERCENT: 13.6 % — ABNORMAL HIGH
NEUTROPHILS ABSOLUTE COUNT: 2.7 10*9/L
NEUTROPHILS RELATIVE PERCENT: 64.5 %
PLATELET COUNT: 213 10*9/L
RED BLOOD CELL COUNT: 4.5 10*12/L
RED CELL DISTRIBUTION WIDTH: 13 %
WBC ADJUSTED: 4.2 10*9/L

## 2019-09-25 LAB — BASIC METABOLIC PANEL
CALCIUM: 9.7 mg/dL
CHLORIDE: 103 mmol/L
CO2: 26.3 mmol/L
CREATININE: 0.9 mg/dL
EGFR CKD-EPI AA FEMALE: 82 mL/min/{1.73_m2}
GLUCOSE RANDOM: 92 mg/dL
POTASSIUM: 4.2 mmol/L

## 2019-09-25 LAB — TRIGLYCERIDES: Lab: 111

## 2019-09-25 LAB — PHOSPHORUS
Lab: 3.6
PHOSPHORUS: 3.6 mg/dL

## 2019-09-25 LAB — MONOCYTES ABSOLUTE COUNT: Lab: 0.6

## 2019-09-25 LAB — LIPID PANEL
CHOLESTEROL/HDL RATIO SCREEN: 3.9
LDL CHOLESTEROL CALCULATED: 186 mg/dL — ABNORMAL HIGH
TRIGLYCERIDES: 111 mg/dL

## 2019-09-25 LAB — MAGNESIUM: Lab: 1.9

## 2019-09-25 LAB — CALCIUM: Lab: 9.7

## 2019-09-26 NOTE — Unmapped (Signed)
Pt called on nurse to discuss her cholesterol level from yesterday on 09/25/19.  It was elevated as it was 05/2019 as well.  I told her I would let Dr. Margaretmary Bayley and Herbert Seta her coordinator know that she would like to discuss starting a cholesterol medicine.

## 2019-09-29 LAB — CYCLOSPORINE, TROUGH: Lab: 177

## 2019-10-02 MED ORDER — ATORVASTATIN 10 MG TABLET
ORAL_TABLET | Freq: Every day | ORAL | 11 refills | 30.00000 days | Status: CP
Start: 2019-10-02 — End: 2020-10-01

## 2019-10-02 NOTE — Unmapped (Signed)
Pt called requesting review of lipid panel. Reviewed with Dr. Margaretmary Bayley, prescription for Atorvastatin 10mg  daily sent to local pharmacy. Pt made aware, verbalized understanding.     Pt also had questions about when to restart Myfortic and how often to check EBV. Made pt aware would follow up with Dr. Margaretmary Bayley and call her when I have an answer. Message sent to Dr. Margaretmary Bayley.

## 2019-10-13 NOTE — Unmapped (Signed)
[facility] Special[facility] Pharm[facility] Refill Coordination Note    Specialty Medication(s) to be Shipped:   Transplant: cyclosporine 25mg     Other medication(s) to be shipped: none     Gina Hart, DOB: 11-01-1975  Phone: [phone] (home)       All above HIPAA information was verified with patient.     Was a Nurse, learning disability used for this call? No    Completed refill call assessment today to schedule patient's medication shipment from the [facility] [phone]).       Specialty medication(s) and dose(s) confirmed: Regimen is correct and unchanged.   Changes to medications: Steffanie reports no changes at this time.  Changes to insurance: No  Questions for the pharmacist: No    Confirmed patient received Welcome Packet with first shipment. The patient will receive a drug information handout for each medication shipped and additional FDA Medication Guides as required.       DISEASE/MEDICATION-SPECIFIC INFORMATION        N/A    SPECIALTY MEDICATION ADHERENCE     Medication Adherence    Patient reported X missed doses in the last month: 0          Cyclosprorine 25mg : 4 days worth of medication on hand.        SHIPPING     Shipping address confirmed in Epic.     Delivery Scheduled: Yes, Expected medication delivery date: 10/15/19.     Medication will be delivered via UPS to the prescription address in Epic WAM.    Gina Hart   [facility] Specialty Technician

## 2019-10-14 MED FILL — CYCLOSPORINE MODIFIED 25 MG CAPSULE: 30 days supply | Qty: 300 | Fill #2 | Status: AC

## 2019-10-14 MED FILL — CYCLOSPORINE MODIFIED 25 MG CAPSULE: ORAL | 30 days supply | Qty: 300 | Fill #2

## 2019-11-04 NOTE — Unmapped (Signed)
Pt requested Lipid panel sent to local lab.

## 2019-11-06 LAB — CBC W/ DIFFERENTIAL
BASOPHILS ABSOLUTE COUNT: 0 10*9/L — ABNORMAL LOW
BASOPHILS RELATIVE PERCENT: 0.5 %
EOSINOPHILS ABSOLUTE COUNT: 0.2 10*9/L
EOSINOPHILS RELATIVE PERCENT: 4.1 %
HEMATOCRIT: 39.6 %
LYMPHOCYTES ABSOLUTE COUNT: 0.8 10*9/L — ABNORMAL LOW
MEAN CORPUSCULAR HEMOGLOBIN CONC: 34.2 g/dL
MEAN CORPUSCULAR HEMOGLOBIN: 30.3 pg
MEAN CORPUSCULAR VOLUME: 88.5 fL
MEAN PLATELET VOLUME: 8.4 fL
MONOCYTES ABSOLUTE COUNT: 0.5 10*9/L
MONOCYTES RELATIVE PERCENT: 13 % — ABNORMAL HIGH
NEUTROPHILS ABSOLUTE COUNT: 2.4 10*9/L
NEUTROPHILS RELATIVE PERCENT: 62 %
PLATELET COUNT: 213 10*9/L
RED BLOOD CELL COUNT: 4.5 10*12/L
RED CELL DISTRIBUTION WIDTH: 13.2 %
WHITE BLOOD CELL COUNT: 3.9 10*9/L — ABNORMAL LOW

## 2019-11-06 LAB — GLUCOSE RANDOM: Lab: 86

## 2019-11-06 LAB — NON-HDL CHOLESTEROL: Lab: 0

## 2019-11-06 LAB — BASIC METABOLIC PANEL
BLOOD UREA NITROGEN: 19 mg/dL — ABNORMAL HIGH
CHLORIDE: 104 mmol/L
CO2: 24.9 mmol/L
CREATININE: 0.9 mg/dL
EGFR CKD-EPI AA FEMALE: 82 mL/min/{1.73_m2}
GLUCOSE RANDOM: 86 mg/dL

## 2019-11-06 LAB — HYPERCHROMASIA: Lab: 0

## 2019-11-06 LAB — LIPID PANEL
CHOLESTEROL/HDL RATIO SCREEN: 2.5
CHOLESTEROL: 190 mg/dL
TRIGLYCERIDES: 68 mg/dL

## 2019-11-06 LAB — MAGNESIUM: Lab: 1.8

## 2019-11-06 LAB — PHOSPHORUS: Lab: 3.5

## 2019-11-07 LAB — CYCLOSPORINE, TROUGH: Lab: 200

## 2019-11-07 NOTE — Unmapped (Signed)
Pt called to review lab results from earlier this week. All questions answered. Lab orders faxed over with quarterly EBV and Lipid panel. Pt aware.

## 2019-11-11 ENCOUNTER — Other Ambulatory Visit (HOSPITAL_COMMUNITY)
Admission: RE | Admit: 2019-11-11 | Discharge: 2019-11-11 | Disposition: A | Discharge status: Home / Self Care | Payer: Medicare Other | Source: Ambulatory Visit | Attending: Neurology | Admitting: Neurology

## 2019-11-11 MED FILL — CYCLOSPORINE MODIFIED 25 MG CAPSULE: 30 days supply | Qty: 300 | Fill #3 | Status: AC

## 2019-11-11 MED FILL — CYCLOSPORINE MODIFIED 25 MG CAPSULE: ORAL | 30 days supply | Qty: 300 | Fill #3

## 2019-11-11 NOTE — Unmapped (Signed)
[facility] Clinical Assessment & Refill Coordination Note    Gina Hart, DOB: 1976-06-26  Phone: [phone] (home)     All above HIPAA information was verified with patient.     Was a Nurse, learning disability used for this call? No    Specialty Medication(s):   Transplant: cyclosporine 25mg      Current Outpatient Medications   Medication Sig Dispense Refill   ??? acetaminophen (TYLENOL) 325 MG tablet Take 1-2 tablets (325-650 mg total) by mouth every four (4) hours as needed for pain. 100 tablet 0   ??? amLODIPine (NORVASC) 5 MG tablet Take 2 tablets (10 mg total) by mouth nightly. Hold for SBP < 90 and/or MAP < 70 60 tablet 10   ??? aspirin (ECOTRIN) 81 MG tablet TAKE 1 TABLET (81 MG) BY MOUTH DAILY 30 tablet 0   ??? atorvastatin (LIPITOR) 10 MG tablet Take 1 tablet (10 mg total) by mouth daily. 30 tablet 11   ??? cyclobenzaprine (FLEXERIL) 10 MG tablet Take 1 tablet (10 mg total) by mouth two (2) times a day as needed for muscle spasms. (Patient not taking: Reported on 10/02/2017) 45 tablet 0   ??? cycloSPORINE modified (NEORAL) 25 MG capsule Take 5 capsules (125 mg total) by mouth two (2) times a day. 300 capsule 11   ??? famotidine (PEPCID) 20 MG tablet Take 1 tablet (20 mg total) by mouth daily as needed for heartburn. 30 tablet 6   ??? fluticasone propionate (FLONASE) 50 mcg/actuation nasal spray 1 spray by Each Nare route daily. 16 g 4   ??? gabapentin (NEURONTIN) 100 MG capsule Take 2 capsules (200 mg total) by mouth Three (3) times a day. (Patient not taking: Reported on 01/02/2018) 180 capsule 11   ??? loratadine (CLARITIN) 10 mg tablet Take 10 mg by mouth daily as needed for allergies.     ??? metoprolol tartrate (LOPRESSOR) 100 MG tablet Take 1 tablet (100 mg total) by mouth Two (2) times a day. 180 tablet 3   ??? MYFORTIC 180 mg EC tablet HOLD (Patient not taking: Reported on 07/09/2019) 60 tablet 11   ??? predniSONE (DELTASONE) 5 MG tablet Take 1 tablet (5 mg total) by mouth daily. 30 tablet 11   ??? sertraline (ZOLOFT) 100 MG tablet Take 1 tablet (100 mg total) by mouth daily. 90 tablet 3   ??? traMADol (ULTRAM) 50 mg tablet Take 50 mg by mouth every six (6) hours as needed for pain.     ??? traZODone (DESYREL) 50 MG tablet Take 1 tablet (50 mg total) by mouth nightly. 90 tablet 3     No current facility-administered medications for this visit.        Changes to medications: [name] reports no changes at this time.    Allergies   Allergen Reactions   ??? Ibuprofen Swelling     Other reaction(s): SWELLING/EDEMA       Changes to allergies: No    SPECIALTY MEDICATION ADHERENCE     Cyclosporine 25mg   : 3 days of medicine on hand     Medication Adherence    Patient reported X missed doses in the last month: 0  Specialty Medication: cyclosporine 25mg           Specialty medication(s) dose(s) confirmed: Regimen is correct and unchanged.     Are there any concerns with adherence? No    Adherence counseling provided? Not needed    CLINICAL MANAGEMENT AND INTERVENTION      Clinical Benefit Assessment:  Do you feel the medicine is effective or helping your condition? Yes    Clinical Benefit counseling provided? Not needed    Adverse Effects Assessment:    Are you experiencing any side effects? No    Are you experiencing difficulty administering your medicine? No    Quality of Life Assessment:    How many days over the past month did your transplant  keep you from your normal activities? For example, brushing your teeth or getting up in the morning. 0    Have you discussed this with your provider? Not needed    Therapy Appropriateness:    Is therapy appropriate? Yes, therapy is appropriate and should be continued    DISEASE/MEDICATION-SPECIFIC INFORMATION      N/A    PATIENT SPECIFIC NEEDS     - Does the patient have any physical, cognitive, or cultural barriers? No    - Is the patient high risk? No     - Does the patient require a Care Management Plan? No     - Does the patient require physician intervention or other additional services (i.e. nutrition, smoking cessation, social work)? No      SHIPPING     Specialty Medication(s) to be Shipped:   Transplant: cyclosporine 25mg     Other medication(s) to be shipped: na     Changes to insurance: No    Delivery Scheduled: Yes, Expected medication delivery date: 11/12/2019.     Medication will be delivered via UPS to the confirmed prescription address in [facility].    The patient will receive a drug information handout for each medication shipped and additional FDA Medication Guides as required.  Verified that patient has previously received a Conservation officer, historic buildings.    All of the patient's questions and concerns have been addressed.    Thad Ranger   [facility] Specialty Pharmacist

## 2019-11-12 LAB — EBV QUANT LOG: Lab: 2.21 — AB

## 2019-11-12 LAB — EBV QUANTITATIVE PCR, BLOOD: EBV QUANT LOG: 2.21 {Log_IU}/mL — AB (ref <=0.00)

## 2019-11-13 DIAGNOSIS — Z940 Kidney transplant status: Principal | ICD-10-CM

## 2019-11-13 DIAGNOSIS — M4802 Spinal stenosis, cervical region: Principal | ICD-10-CM

## 2019-11-13 NOTE — Unmapped (Signed)
Received call from patient, she states she had bad experience trying to schedule with [facility]. She would like a referral to another spine center. No spine centers close to her home. Pt opting to have referral sent to [facility]. Referral ordered and faxed to [facility]. Pt aware.

## 2019-11-26 ENCOUNTER — Telehealth: Admit: 2019-11-26 | Discharge: 2019-11-27 | Payer: MEDICARE | Attending: Nephrology | Primary: Nephrology

## 2019-11-26 NOTE — Unmapped (Signed)
Transplant Nephrology Clinic Visit    ??  Assessment and Plan  Gina Hart is a 44 y.o. female who is s/p deceased donor kidney transplant on February 06, 2017. She is evaluated today by video visit. Active medical issues include:    1. Status post deceased donor kidney transplant on 06-Feb-2017 with no history of rejection or evidence of FSGS recurrence. Renal function has been stable with serum creatinine 0.90 on 11/04/19 (baseline <1.0 mg/dL). UP/C has been historically normal ( most recent 0.073 on 06/10/19). DSA screen was negative on 06/10/19 though class II could not be checked due to an interfering substance.    2. EBV viremia with enlarged tonsils. There was no clear evidence of PTLD though this remains a concern. It is reassuring that viral load improved after Myfortic was held, and also there was less tonsillar enlargement by report on the second PET CT scan (direct comparison was not possible though). Will continue to follow periodic EBV viral loads and plan another PET CT in July.     3. Immunosuppression. Currently on cyclosporine 125 mg BID and prednisone 5 mg daily. Cyclosporine level on 11/04/19 was 200 (target 125-200). Myfortic has been held due to EBV viremia.      4. Hypertension.  On amlodipine 10mg  daily and metoprolol tartrate 100mg  bid. Will continue current antihypertensives.       5. Headaches, improved off tacrolimus.      6. Spinal stenosis, cervical. She will continue to follow in spine clinic.     7. Health Maintenance. Will defer pneumococcal vaccines until next in-person visit. Patient had flu vaccine 03/2019.  COVID-19 vaccination dose #1 completed in early May, but she was worried about taking a second dose since she heard that causes more side effects. She will plan to take the second dose after our discussion today.      8. Memory Loss/Confusion. Brain MRI 07/06/18 was normal. Symptoms initially improved after switching to cyclosporine from tacrolimus, but appear now to be persistent. She continues to have insomnia and sleep apnea may be contributing to this problem. She will continue trazodone and melatonin.     9. History of obstructive sleep apnea, untreated. She was encouraged to schedule a local sleep study.     10. Right foot edema without pain. Will plan venous LE dopplers at her next visit. This is a chronic problem and I feel we can wait on the study until she comes up for her repeat PET CT.    10. Follow up in 3 months. PET CT and LE dopplers in July at least several weeks following her second dose of COVID-19 vaccine.     History of Present Illness  Gina Hart is a 44 y.o. female who is s/p deceased kidney transplant on 02-06-2017. She has experienced no episodes of rejection with normal urinary protein and baseline creatinine <1.0 mg/dL.     She was diagnosed with EBV viremia 06/10/19 (Viral load 9010) with symptoms of nasal congestion, and loss of taste and smell. COVID-19 PCR and IgG testing were both negative. PET CT 06/25/19 revealed symmetric hypermetabolic activity in the lingual tonsils without evidence of lymphadenopathy elsewhere. ENT evaluation on 07/09/19 revealed enlarged tonsils, enlarged turbinates, and congested nasal mucosa. Repeat PET CT at [facility] on 07/11/19 revealed only slightly increased uptake in the lingual tonsils without evidence of malignancy. EBV viral load then declined to 100 by 08/07/19 and more recently was 163 on 11/04/19.      Today she complains of mild nasal congestion  and post-nasal drip. She denies sore throat, tonsillar enlargement, lymphadenopathy, fever, chills,  nausea, vomiting, abdominal pain, or diarrhea. She does complain of fatigue and has still not had her sleep study. She notes swelling in her right foot which is a chronic problem and not accompanied by pain. Headaches remain infrequent (1-2 x/ week) since she switched from tacrolimus to cyclosporine. She continues to have memory deficits and insomnia, but these are not worse.  She denies dysuria or allograft tenderness. Home blood pressure has been approximately 1330-140/80s.      Immunosuppressive medication doses are unchanged. She continues to hold Myfortic due to the EBV viremia history.     Transplant History:  1. 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  2. S/P kidney transplant at [facility] 01/23/2107. Deceased donor with KDPI 19%. CMV D+/R-; EBV D+/R+  3. Immunosuppression = campath induction followed by tacrolimus and myfortic maintenance therapy, steroid free regimen.   4. Tacrolimus changed to cyclosporine 07/31/2017 due to neurotoxicity with memory deficits and headaches as primary manifestation.  5. RSV infection 09/10/18  6. EBV viremia 05/2020 (VL 9010) with increased tonsillar uptake on PET CT scans 06/24/20 and 07/11/19. No evidence of PTLD.      Past Medical History:  1. ESRD secondary to FSGS.   2. Hypertension  3. Scoliosis  S/P kidney transplant as stated above    Review of Systems    All other systems are reviewed and are negative.    Medications  Current Outpatient Medications   Medication Sig Dispense Refill   ??? acetaminophen (TYLENOL) 325 MG tablet Take 1-2 tablets (325-650 mg total) by mouth every four (4) hours as needed for pain. 100 tablet 0   ??? amLODIPine (NORVASC) 5 MG tablet Take 2 tablets (10 mg total) by mouth nightly. Hold for SBP < 90 and/or MAP < 70 60 tablet 10   ??? aspirin (ECOTRIN) 81 MG tablet TAKE 1 TABLET (81 MG) BY MOUTH DAILY 30 tablet 0   ??? atorvastatin (LIPITOR) 10 MG tablet Take 1 tablet (10 mg total) by mouth daily. 30 tablet 11   ??? cyclobenzaprine (FLEXERIL) 10 MG tablet Take 1 tablet (10 mg total) by mouth two (2) times a day as needed for muscle spasms. (Patient not taking: Reported on 10/02/2017) 45 tablet 0   ??? cycloSPORINE modified (NEORAL) 25 MG capsule Take 5 capsules (125 mg total) by mouth two (2) times a day. 300 capsule 11   ??? famotidine (PEPCID) 20 MG tablet Take 1 tablet (20 mg total) by mouth daily as needed for heartburn. 30 tablet 6   ??? fluticasone propionate (FLONASE) 50 mcg/actuation nasal spray 1 spray by Each Nare route daily. 16 g 4   ??? gabapentin (NEURONTIN) 100 MG capsule Take 2 capsules (200 mg total) by mouth Three (3) times a day. (Patient not taking: Reported on 01/02/2018) 180 capsule 11   ??? loratadine (CLARITIN) 10 mg tablet Take 10 mg by mouth daily as needed for allergies.     ??? metoprolol tartrate (LOPRESSOR) 100 MG tablet Take 1 tablet (100 mg total) by mouth Two (2) times a day. 180 tablet 3   ??? MYFORTIC 180 mg EC tablet HOLD (Patient not taking: Reported on 07/09/2019) 60 tablet 11   ??? predniSONE (DELTASONE) 5 MG tablet Take 1 tablet (5 mg total) by mouth daily. 30 tablet 11   ??? sertraline (ZOLOFT) 100 MG tablet Take 1 tablet (100 mg total) by mouth daily. 90 tablet 3   ???  traMADol (ULTRAM) 50 mg tablet Take 50 mg by mouth every six (6) hours as needed for pain.     ??? traZODone (DESYREL) 50 MG tablet Take 1 tablet (50 mg total) by mouth nightly. 90 tablet 3     No current facility-administered medications for this visit.       Physical Exam    Patient appears in no distress on video visit.     Laboratory Results  No results found for this or any previous visit (from the past 170 hour(s)).    Video Visit:    I spent 19 minutes on the audio/video with the patient. I spent an additional 15 minutes on pre- and post-visit activities. I was present in the [facility] clinic during this visit. I was present in the [facility] clinic during this visit.     The patient was physically located in [place] or a state in which I am permitted to provide care. The patient and/or parent/gauardian understood that s/he may incur co-pays and cost sharing, and agreed to the telemedicine visit. The visit was completed via phone and/or video, which was appropriate and reasonable under the circumstances given the patient's presentation at the time.    The patient and/or parent/guardian has been advised of the potential risks and limitations of this mode of treatment (including, but not limited to, the absence of in-person examination) and has agreed to be treated using telemedicine. The patient's/patient's family's questions regarding telemedicine have been answered.     If the phone/video visit was completed in an ambulatory setting, the patient and/or parent/guardian has also been advised to contact their provider???s office for worsening conditions, and seek emergency medical treatment and/or call 911 if the patient deems either necessary.

## 2019-12-01 ENCOUNTER — Other Ambulatory Visit (HOSPITAL_BASED_OUTPATIENT_CLINIC_OR_DEPARTMENT_OTHER): Payer: Self-pay

## 2019-12-05 NOTE — Unmapped (Signed)
[facility] Refill Coordination Note    Specialty Medication(s) to be Shipped:   Transplant: cyclosporine 25mg     Other medication(s) to be shipped: N/A     Gina Hart, DOB: Apr 28, 1976  Phone: [phone] (home)       All above HIPAA information was verified with patient.     Was a Nurse, learning disability used for this call? No    Completed refill call assessment today to schedule patient's medication shipment from the [facility] [phone]).       Specialty medication(s) and dose(s) confirmed: Regimen is correct and unchanged.   Changes to medications: Gina Hart reports no changes at this time.  Changes to insurance: No  Questions for the pharmacist: No    Confirmed patient received Welcome Packet with first shipment. The patient will receive a drug information handout for each medication shipped and additional FDA Medication Guides as required.       DISEASE/MEDICATION-SPECIFIC INFORMATION        N/A    SPECIALTY MEDICATION ADHERENCE     Medication Adherence    Patient reported X missed doses in the last month: 1  Specialty Medication: Cyclosporine 25mg   Patient is on additional specialty medications: No        Cyclosporine 25 mg: 12 days of medicine on hand     SHIPPING     Shipping address confirmed in Epic.     Delivery Scheduled: Yes, Expected medication delivery date: 12/12/2019.     Medication will be delivered via UPS to the prescription address in Epic WAM.    Lorelei Pont [facility] Specialty Technician

## 2019-12-10 NOTE — Unmapped (Signed)
Spoke to Ms. Chisum about the change in her cyclosporine mfg due to a backorder issue.  Told her that we are sending her a different mfg than what she has been taking.  Told her to give the clinic a call to set up labs when she starts the new mfg.   Pt acknowledged understanding.

## 2019-12-11 MED FILL — CYCLOSPORINE MODIFIED 25 MG CAPSULE: 30 days supply | Qty: 300 | Fill #4 | Status: AC

## 2019-12-11 MED FILL — CYCLOSPORINE MODIFIED 25 MG CAPSULE: ORAL | 30 days supply | Qty: 300 | Fill #4

## 2019-12-12 ENCOUNTER — Other Ambulatory Visit (HOSPITAL_COMMUNITY)
Admission: RE | Admit: 2019-12-12 | Discharge: 2019-12-12 | Disposition: A | Discharge status: Home / Self Care | Payer: Medicare Other | Source: Ambulatory Visit | Attending: Neurology | Admitting: Neurology

## 2019-12-12 NOTE — Progress Notes (Addendum)
Called patient her voice mailbox was full. Patient called me back, said she could be here in 30 minutes.  I called registration to let them know, patient would be coming in.  Patient called me back, she is in [place]. Patient said she needed to reschedule everything. Patient is calling registration to reschedule COVID test and get the # for [employer] to reschedule the sleep study. (I tried to transfer her twice, it didn't work.) Nothing further needed.   Called Patient and asked if she was able to get in touch with who made her sleep study appointment. Patient called them, she left a message. Hopefully they will call her and get her  rescheduled Monday.

## 2019-12-16 ENCOUNTER — Other Ambulatory Visit: Payer: Self-pay

## 2019-12-16 ENCOUNTER — Other Ambulatory Visit (HOSPITAL_COMMUNITY)
Admission: RE | Admit: 2019-12-16 | Discharge: 2019-12-16 | Disposition: A | Discharge status: Home / Self Care | Payer: Medicare Other | Source: Ambulatory Visit | Attending: Neurology | Admitting: Neurology

## 2019-12-16 DIAGNOSIS — Z20822 Contact with and (suspected) exposure to covid-19: Secondary | ICD-10-CM | POA: Insufficient documentation

## 2019-12-16 DIAGNOSIS — Z01812 Encounter for preprocedural laboratory examination: Secondary | ICD-10-CM | POA: Insufficient documentation

## 2019-12-16 LAB — SARS CORONAVIRUS 2 (TAT 6-24 HRS): SARS Coronavirus 2: NEGATIVE

## 2019-12-17 ENCOUNTER — Ambulatory Visit: Payer: Medicare Other | Attending: Nephrology | Admitting: Neurology

## 2019-12-17 DIAGNOSIS — G4761 Periodic limb movement disorder: Secondary | ICD-10-CM | POA: Insufficient documentation

## 2019-12-17 DIAGNOSIS — G4730 Sleep apnea, unspecified: Secondary | ICD-10-CM

## 2019-12-17 DIAGNOSIS — Z79899 Other long term (current) drug therapy: Secondary | ICD-10-CM | POA: Diagnosis not present

## 2019-12-17 DIAGNOSIS — R0683 Snoring: Secondary | ICD-10-CM | POA: Diagnosis not present

## 2019-12-17 DIAGNOSIS — Z7982 Long term (current) use of aspirin: Secondary | ICD-10-CM | POA: Diagnosis not present

## 2019-12-23 NOTE — Procedures (Signed)
Gratis A. Merlene Laughter, MD     [email or web address]             NOCTURNAL POLYSOMNOGRAPHY   LOCATION: [facility]    Patient Name: Vicki Mercer, Vicki Mercer Date: 12/17/2019 Gender: Female D.O.B: 1975-10-14 Age (years): 15 Referring Provider: Not Available Height (inches): 63 Interpreting Physician: Phillips Odor MD, ABSM Weight (lbs): 203 RPSGT: Peak, Robert BMI: 36 MRN: [id number] Neck Size: 14.00 CLINICAL INFORMATION Sleep Study Type: NPSG     Indication for sleep study: N/A     Epworth Sleepiness Score: NA     SLEEP STUDY TECHNIQUE As per the AASM Manual for the Scoring of Sleep and Associated Events v2.3 (April 2016) with a hypopnea requiring 4% desaturations.  The channels recorded and monitored were frontal, central and occipital EEG, electrooculogram (EOG), submentalis EMG (chin), nasal and oral airflow, thoracic and abdominal wall motion, anterior tibialis EMG, snore microphone, electrocardiogram, and pulse oximetry.  MEDICATIONS Medications self-administered by patient taken the night of the study : N/A  Current Outpatient Medications:  .  acetaminophen (TYLENOL) 500 MG tablet, Take 500 mg by mouth every 6 (six) hours as needed., Disp: , Rfl:  .  amLODipine (NORVASC) 5 MG tablet, Take 10 mg by mouth daily., Disp: , Rfl:  .  aspirin EC 81 MG tablet, Take 81 mg by mouth daily., Disp: , Rfl:  .  cyclobenzaprine (FLEXERIL) 10 MG tablet, Take 10 mg by mouth 2 (two) times daily., Disp: , Rfl:  .  famotidine (PEPCID) 20 MG tablet, Take 20 mg by mouth every morning., Disp: , Rfl:  .  gabapentin (NEURONTIN) 100 MG capsule, Take 200 mg by mouth 3 (three) times daily., Disp: , Rfl:  .  loratadine (CLARITIN) 10 MG tablet, Take 10 mg by mouth daily as needed for allergies., Disp: , Rfl:  .  metoprolol tartrate (LOPRESSOR) 100 MG tablet, Take 100 mg by mouth 2 (two) times daily., Disp: , Rfl:  .  mycophenolate (MYFORTIC) 180 MG EC tablet, Take 180 mg  by mouth 2 (two) times daily., Disp: , Rfl:  .  sertraline (ZOLOFT) 100 MG tablet, Take 100 mg by mouth daily., Disp: , Rfl:  .  tacrolimus (PROGRAF) 1 MG capsule, Take 1 mg by mouth 2 (two) times daily., Disp: , Rfl:  .  TRAZODONE HCL PO, Take 50 mg/day by mouth daily., Disp: , Rfl:      SLEEP ARCHITECTURE The study was initiated at 9:38:41 PM and ended at 5:00:01 AM.  Sleep onset time was 9.4 minutes and the sleep efficiency was 90.2%%. The total sleep time was 398 minutes.  Stage REM latency was 206.0 minutes.  The patient spent 2.0%% of the night in stage N1 sleep, 88.6%% in stage N2 sleep, 7.2%% in stage N3 and 2.3% in REM.  Alpha intrusion was absent.  Supine sleep was 53.52%.  RESPIRATORY PARAMETERS The overall apnea/hypopnea index (AHI) was 1.8 per hour. There were 2 total apneas, including 1 obstructive, 1 central and 0 mixed apneas. There were 10 hypopneas and 13 RERAs.  The AHI during Stage REM sleep was 33.3 per hour.  AHI while supine was 2.5 per hour.  The mean oxygen saturation was 94.3%. The minimum SpO2 during sleep was 87.0%.  moderate snoring was noted during this study.  CARDIAC DATA The 2 lead EKG demonstrated sinus rhythm. The mean heart rate was 60.6 beats per minute. Other EKG findings include: None.  LEG MOVEMENT DATA Mild to moderate periodic limb movements are noted.  IMPRESSIONS 1.  Mild to moderate periodic limb movements are observed. 2.  No sleep disordered breathing is noted.   Delano Metz, MD Diplomate, American Board of Sleep Medicine.  ELECTRONICALLY SIGNED ON:  12/23/2019, 11:07 PM [place] PH: [phone]   FX: [phone] [place]

## 2020-01-06 NOTE — Unmapped (Signed)
[facility] Refill Coordination Note    Specialty Medication(s) to be Shipped:   Transplant: cyclosporine 25mg     Other medication(s) to be shipped: none     Gina Hart, DOB: 1976-03-31  Phone: [phone] (home)       All above HIPAA information was verified with patient.     Was a Nurse, learning disability used for this call? No    Completed refill call assessment today to schedule patient's medication shipment from the [facility] [phone]).       Specialty medication(s) and dose(s) confirmed: Regimen is correct and unchanged.   Changes to medications: [name] reports no changes at this time.  Changes to insurance: No  Questions for the pharmacist: No    Confirmed patient received Welcome Packet with first shipment. The patient will receive a drug information handout for each medication shipped and additional FDA Medication Guides as required.       DISEASE/MEDICATION-SPECIFIC INFORMATION        N/A    SPECIALTY MEDICATION ADHERENCE     Medication Adherence    Patient reported X missed doses in the last month: 0  Specialty Medication: cyclosporine 25mg   Patient is on additional specialty medications: No        cyclosporine 25mg : 7 days worth of medication on hand.        SHIPPING     Shipping address confirmed in Epic.     Delivery Scheduled: Yes, Expected medication delivery date: 01/08/20.     Medication will be delivered via UPS to the prescription address in Epic WAM.    [name]   [facility] Specialty Technician

## 2020-01-07 MED FILL — CYCLOSPORINE MODIFIED 25 MG CAPSULE: ORAL | 30 days supply | Qty: 300 | Fill #5

## 2020-01-07 MED FILL — CYCLOSPORINE MODIFIED 25 MG CAPSULE: 30 days supply | Qty: 300 | Fill #5 | Status: AC

## 2020-01-07 NOTE — Unmapped (Signed)
Patient called on call nurse and stated that she needs new lab orders.  She stated she was at the lab and they were not able to draw her labs today without an order.  She stated the lab also needs the order date to state 11/19/2019.  She asked if we had received labs from June and I told her no.  She stated the labs was going to fax June results.  I told her that I would let her primary coordinator know this.  She was told she can go back tomorrow to get labs as the orders would be sent in.  She stated she was not sure if she could go back tomorrow.

## 2020-01-07 NOTE — Unmapped (Signed)
Updated lab orders faxed to [facility] per pt request.

## 2020-01-08 LAB — CBC W/ DIFFERENTIAL
HEMATOCRIT: 38.3 %
HEMOGLOBIN: 12.5 g/dL
LYMPHOCYTES RELATIVE PERCENT: 22.3 %
MEAN CORPUSCULAR HEMOGLOBIN CONC: 32.7 g/dL — ABNORMAL LOW
MEAN CORPUSCULAR HEMOGLOBIN: 29.7 pg
MEAN CORPUSCULAR VOLUME: 90.9 fL
MEAN PLATELET VOLUME: 7.5 fL
MONOCYTES ABSOLUTE COUNT: 0.5 10*9/L
NEUTROPHILS ABSOLUTE COUNT: 2.8 10*9/L
NEUTROPHILS RELATIVE PERCENT: 65.4 %
PLATELET COUNT: 190 10*9/L
RED BLOOD CELL COUNT: 4.2 10*12/L
RED CELL DISTRIBUTION WIDTH: 12.6 %
WHITE BLOOD CELL COUNT: 4.3 10*9/L

## 2020-01-08 LAB — MAGNESIUM: Lab: 1.7 — ABNORMAL LOW

## 2020-01-08 LAB — BASIC METABOLIC PANEL
BLOOD UREA NITROGEN: 18 mg/dL
CHLORIDE: 108 mmol/L
CO2: 23.9 mmol/L
CREATININE: 0.8 mg/dL
GLUCOSE RANDOM: 82 mg/dL
POTASSIUM: 4 mmol/L

## 2020-01-08 LAB — BASOPHILS ABSOLUTE COUNT: Lab: 0

## 2020-01-08 LAB — CYCLOSPORINE, TROUGH: Lab: 119

## 2020-01-08 LAB — BLOOD UREA NITROGEN: Lab: 18

## 2020-01-08 LAB — PHOSPHORUS: Lab: 3.2

## 2020-01-20 DIAGNOSIS — Z940 Kidney transplant status: Principal | ICD-10-CM

## 2020-01-20 DIAGNOSIS — N390 Urinary tract infection, site not specified: Principal | ICD-10-CM

## 2020-01-20 LAB — PHOSPHORUS: Lab: 3.5

## 2020-01-20 LAB — BASIC METABOLIC PANEL
BLOOD UREA NITROGEN: 19 mg/dL — ABNORMAL HIGH
CALCIUM: 9.5 mg/dL
CHLORIDE: 104 mmol/L
CO2: 27.5 mmol/L
CREATININE: 0.8 mg/dL
EGFR CKD-EPI AA FEMALE: 94 mL/min/{1.73_m2}
GLUCOSE RANDOM: 83 mg/dL
POTASSIUM: 4 mmol/L
SODIUM: 141 mmol/L

## 2020-01-20 LAB — URINALYSIS
BILIRUBIN UA: NEGATIVE
GLUCOSE UA: NEGATIVE
KETONES UA: NEGATIVE
LEUKOCYTE ESTERASE UA: NEGATIVE
NITRITE UA: NEGATIVE
PH UA: 6
SPECIFIC GRAVITY UA: 1.02 (ref 1.016–1.022)
UROBILINOGEN UA: 0.2

## 2020-01-20 LAB — CBC W/ DIFFERENTIAL
BASOPHILS RELATIVE PERCENT: 0 %
EOSINOPHILS ABSOLUTE COUNT: 0.1 10*9/L
EOSINOPHILS RELATIVE PERCENT: 3.6 %
HEMATOCRIT: 39.5 %
HEMOGLOBIN: 13.5 g/dL
LYMPHOCYTES ABSOLUTE COUNT: 0.7 10*9/L — ABNORMAL LOW
LYMPHOCYTES RELATIVE PERCENT: 20.2 %
MEAN CORPUSCULAR HEMOGLOBIN CONC: 34.2 g/dL
MEAN CORPUSCULAR HEMOGLOBIN: 30.3 pg
MONOCYTES ABSOLUTE COUNT: 0.5 10*9/L
MONOCYTES RELATIVE PERCENT: 13.7 % — ABNORMAL HIGH
NEUTROPHILS ABSOLUTE COUNT: 2.3 10*9/L
NEUTROPHILS RELATIVE PERCENT: 3.6 % — ABNORMAL LOW
PLATELET COUNT: 228 10*9/L
RED BLOOD CELL COUNT: 4.5 10*12/L
RED CELL DISTRIBUTION WIDTH: 13 %
WHITE BLOOD CELL COUNT: 3.6 10*9/L — ABNORMAL LOW

## 2020-01-20 LAB — PROTEIN URINE: Lab: 11.4

## 2020-01-20 LAB — MAGNESIUM: Lab: 1.8

## 2020-01-20 LAB — EGFR CKD-EPI AA MALE: Lab: 0

## 2020-01-20 LAB — CYCLOSPORINE, TROUGH: Lab: 175

## 2020-01-20 LAB — HYALINE CASTS: Lab: 0

## 2020-01-20 LAB — SLIDE SCAN: Lab: 0

## 2020-01-20 MED ORDER — AMOXICILLIN 875 MG-POTASSIUM CLAVULANATE 125 MG TABLET
ORAL_TABLET | Freq: Two times a day (BID) | ORAL | 0 refills | 7.00000 days | Status: CP
Start: 2020-01-20 — End: 2020-01-27

## 2020-01-20 NOTE — Unmapped (Signed)
Returned patients call, she was inquring where her labs were. Requesting them from local lab  She reports going the first week of July

## 2020-01-20 NOTE — Unmapped (Signed)
Pt called back asking where her labs were  I instructed her that they were just posted today and take 24 hours to be released from provider.     Pt. Verbalizes understanding.

## 2020-01-20 NOTE — Unmapped (Signed)
Called patient to check on any s/s of UTI from 7/21 UC results.     She reports frequency and urine has odorous smell     Pt. Denies fevers, denies pain or burning     Pending intervention.

## 2020-01-20 NOTE — Unmapped (Signed)
After review recent UC from 7/21 with Dr. Margaretmary Bayley and Pharmacist, patient to take Augmentin 875 mg BID x7d  For treatment of     Comment: Proteus Mirabilis 50,000-60,000 COL/ML ?? ?? ?? Staphylococcus Haemolyticus ??10,000- 20,000 Col/Ml    Called patient back to notify her of Medication     Sent to local pharmacy

## 2020-01-28 MED ORDER — ROPINIROLE 0.25 MG TABLET
ORAL_TABLET | Freq: Every evening | ORAL | 11 refills | 30 days | Status: CP
Start: 2020-01-28 — End: 2021-01-27

## 2020-01-28 NOTE — Unmapped (Signed)
Reviewed sleep study with Dr. Margaretmary Bayley, there is no disordered breathing on the study. She has some increased limb movement. Prescription for Requip 0.25mg  1-3 hrs before bedtime sent to local pharmacy. Pt made aware.

## 2020-02-11 NOTE — Unmapped (Addendum)
Called patient and told her of the mfg change on cyclosporine.  Told her to give the clinic a call when she starts the new mfg. Pt acknowledged understanding.            [facility] Refill Coordination Note    Specialty Medication(s) to be Shipped:   Transplant: cyclosporine 25mg     Other medication(s) to be shipped: No additional medications requested for fill at this time     Gina Hart, DOB: 1976/02/17  Phone: [phone] (home)       All above HIPAA information was verified with patient.     Was a Nurse, learning disability used for this call? No    Completed refill call assessment today to schedule patient's medication shipment from the [facility] [phone]).       Specialty medication(s) and dose(s) confirmed: Regimen is correct and unchanged.   Changes to medications: Era reports no changes at this time.  Changes to insurance: No  Questions for the pharmacist: No    Confirmed patient received Welcome Packet with first shipment. The patient will receive a drug information handout for each medication shipped and additional FDA Medication Guides as required.       DISEASE/MEDICATION-SPECIFIC INFORMATION        N/A    SPECIALTY MEDICATION ADHERENCE     Medication Adherence    Patient reported X missed doses in the last month: 0  Specialty Medication: cyclosporine  Patient is on additional specialty medications: No  Patient is on more than two specialty medications: No  Any gaps in refill history greater than 2 weeks in the last 3 months: no  Demonstrates understanding of importance of adherence: yes  Informant: patient                Cyclosporine 25mg : Patient has 6 days of medication on hand      SHIPPING     Shipping address confirmed in Epic.     Delivery Scheduled: Yes, Expected medication delivery date: 8/23.     Medication will be delivered via UPS to the prescription address in Epic WAM.    Olga Millers   [facility] Specialty Technician

## 2020-02-12 NOTE — Unmapped (Signed)
[facility] Pharmacist Intervention    Type of intervention: Change of MFG on NTI drug    Medication: Cyclosporine 25mg     Problem: Apotex Cyclosporine is not available and have to change to TEVA mfg.    Intervention: Called patient and told her of the mfg change.  Told her to give the clinic a call when she starts the new mfg. Pt acknowledged understanding.    Follow up needed: Will follow up with a clinical call next month to see how she is doing on TEVA mfg    Approximate time spent: 5 minutes    Tera Helper   [facility] Specialty Pharmacist

## 2020-02-13 MED FILL — CYCLOSPORINE MODIFIED 25 MG CAPSULE: ORAL | 30 days supply | Qty: 300 | Fill #6

## 2020-02-13 MED FILL — CYCLOSPORINE MODIFIED 25 MG CAPSULE: 30 days supply | Qty: 300 | Fill #6 | Status: AC

## 2020-02-17 DIAGNOSIS — G4700 Insomnia, unspecified: Principal | ICD-10-CM

## 2020-02-17 DIAGNOSIS — Z940 Kidney transplant status: Principal | ICD-10-CM

## 2020-02-17 MED ORDER — SERTRALINE 100 MG TABLET
ORAL_TABLET | Freq: Every day | ORAL | 3 refills | 90 days | Status: CP
Start: 2020-02-17 — End: 2021-02-16

## 2020-02-18 MED FILL — CYCLOSPORINE MODIFIED 25 MG CAPSULE: 30 days supply | Qty: 300 | Fill #7 | Status: AC

## 2020-02-18 NOTE — Unmapped (Signed)
Gina Hart 's cyclosporine shipment will be sent out  as a result of UPS returned package      I have reached out to the patient and communicated the delivery change. We will reschedule the medication for the delivery date that the patient agreed upon.  We have confirmed the delivery date as 02/18/20, via same day courier.Public house manager per [employer])

## 2020-02-25 LAB — CBC W/ DIFFERENTIAL
BASOPHILS ABSOLUTE COUNT: 0 10*9/L — ABNORMAL LOW
BASOPHILS RELATIVE PERCENT: 0.3 %
EOSINOPHILS ABSOLUTE COUNT: 0.1 10*9/L
EOSINOPHILS RELATIVE PERCENT: 2.8 %
HEMATOCRIT: 40.5 %
HEMOGLOBIN: 13.7 g/dL
LYMPHOCYTES ABSOLUTE COUNT: 0.8 10*9/L — ABNORMAL LOW
LYMPHOCYTES RELATIVE PERCENT: 20.4 %
MEAN CORPUSCULAR HEMOGLOBIN: 30.2 pg
MEAN CORPUSCULAR VOLUME: 89.3 fL
MEAN PLATELET VOLUME: 8.3 fL
MONOCYTES ABSOLUTE COUNT: 0.5 10*9/L
MONOCYTES RELATIVE PERCENT: 13.3 % — ABNORMAL HIGH
NEUTROPHILS ABSOLUTE COUNT: 2.4 10*9/L
PLATELET COUNT: 232 10*9/L
RED BLOOD CELL COUNT: 4.5 10*12/L
RED CELL DISTRIBUTION WIDTH: 12.8 %
WBC ADJUSTED: 3.8 10*9/L — ABNORMAL LOW

## 2020-02-25 LAB — URINALYSIS
BILIRUBIN UA: NEGATIVE
GLUCOSE UA: NEGATIVE
KETONES UA: NEGATIVE
LEUKOCYTE ESTERASE UA: NEGATIVE
NITRITE UA: NEGATIVE
PH UA: 6
PROTEIN UA: NEGATIVE
SPECIFIC GRAVITY UA: 1.02 (ref 1.016–1.022)

## 2020-02-25 LAB — BLOOD UREA NITROGEN: Lab: 20 — ABNORMAL HIGH

## 2020-02-25 LAB — PROTEIN / CREATININE RATIO, URINE: PROTEIN URINE: 18.1 mg/dL

## 2020-02-25 LAB — BASIC METABOLIC PANEL
CALCIUM: 9.2 mg/dL
CHLORIDE: 106 mmol/L
CO2: 25.3 mmol/L
EGFR CKD-EPI AA FEMALE: 82 mL/min/{1.73_m2}
POTASSIUM: 4.1 mmol/L

## 2020-02-25 LAB — LIPID PANEL
CHOLESTEROL: 183 mg/dL
LDL CHOLESTEROL CALCULATED: 88 mg/dL
TRIGLYCERIDES: 62 mg/dL

## 2020-02-25 LAB — HYPOCHROMIA: Lab: 0

## 2020-02-25 LAB — PROTEIN URINE: Lab: 18.1

## 2020-02-25 LAB — SPECIFIC GRAVITY UA: Lab: 1.02

## 2020-02-25 LAB — MAGNESIUM: Lab: 1.8

## 2020-02-25 LAB — LDL CHOLESTEROL CALCULATED: Lab: 88

## 2020-02-25 LAB — PHOSPHORUS: Lab: 3.5

## 2020-02-27 ENCOUNTER — Telehealth: Admit: 2020-02-27 | Payer: MEDICARE | Attending: Nephrology | Primary: Nephrology

## 2020-02-27 NOTE — Unmapped (Signed)
Patient failed to answer either the video visit prompt or a separate phone call for her visit today. We will reschedule her appointment.   Jackey Loge, MD

## 2020-03-02 LAB — EBV AB VCA, IGM: EBV NUCLEAR ANTIGEN AB, IGG: 18

## 2020-03-02 LAB — EBV AB VCA, IGG: Lab: 600 — ABNORMAL HIGH

## 2020-03-02 LAB — CYCLOSPORINE, TROUGH: Lab: 195

## 2020-03-03 NOTE — Unmapped (Signed)
unable to leave message, called to schedule follow up appt with Dr. Margaretmary Bayley from her 9/3 no show. Sent MyChart message. Can be scheduled from the recall tab. 9/8 EW

## 2020-03-05 DIAGNOSIS — N390 Urinary tract infection, site not specified: Principal | ICD-10-CM

## 2020-03-05 DIAGNOSIS — Z79899 Other long term (current) drug therapy: Principal | ICD-10-CM

## 2020-03-05 DIAGNOSIS — Z940 Kidney transplant status: Principal | ICD-10-CM

## 2020-03-05 MED ORDER — AMOXICILLIN 875 MG-POTASSIUM CLAVULANATE 125 MG TABLET
ORAL_TABLET | Freq: Two times a day (BID) | ORAL | 0 refills | 14 days | Status: CP
Start: 2020-03-05 — End: 2020-03-19

## 2020-03-05 NOTE — Unmapped (Signed)
Per Dr. Margaretmary Bayley, prescription for Augmentin 875mg  BID x 14 days sent to local pharmacy for UTI on 8/30. Pt made aware.

## 2020-03-09 NOTE — Unmapped (Signed)
[facility] Refill Coordination Note    Specialty Medication(s) to be Shipped:   Transplant: cyclosporine 25mg     Other medication(s) to be shipped: No additional medications requested for fill at this time     Gina Hart, DOB: 1976/03/09  Phone: [phone] (home)       All above HIPAA information was verified with patient.     Was a Nurse, learning disability used for this call? No    Completed refill call assessment today to schedule patient's medication shipment from the [facility] [phone]).       Specialty medication(s) and dose(s) confirmed: Regimen is correct and unchanged.   Changes to medications: Gina Hart reports no changes at this time.  Changes to insurance: No  Questions for the pharmacist: No    Confirmed patient received Welcome Packet with first shipment. The patient Hart receive a drug information handout for each medication shipped and additional FDA Medication Guides as required.       DISEASE/MEDICATION-SPECIFIC INFORMATION        N/A    SPECIALTY MEDICATION ADHERENCE     Medication Adherence    Patient reported X missed doses in the last month: 1  Specialty Medication: Cyclosporine 25mg   Patient is on additional specialty medications: No        Cyclosporine 25 mg: 9 days of medicine on hand     SHIPPING     Shipping address confirmed in Epic.     Delivery Scheduled: Yes, Expected medication delivery date: 03/15/2020.     Medication Hart be delivered via UPS to the prescription address in Epic WAM.    Lorelei Pont [facility] Specialty Technician

## 2020-03-12 MED FILL — CYCLOSPORINE MODIFIED 25 MG CAPSULE: ORAL | 30 days supply | Qty: 300 | Fill #7

## 2020-03-30 DIAGNOSIS — Z1231 Encounter for screening mammogram for malignant neoplasm of breast: Principal | ICD-10-CM

## 2020-04-02 DIAGNOSIS — Z940 Kidney transplant status: Principal | ICD-10-CM

## 2020-04-02 DIAGNOSIS — D849 Immunodeficiency, unspecified: Principal | ICD-10-CM

## 2020-04-02 MED ORDER — BIOTIN 5 MG CAPSULE
ORAL_CAPSULE | Freq: Every day | ORAL | 11 refills | 30.00000 days | Status: CP
Start: 2020-04-02 — End: 2021-04-02

## 2020-04-02 NOTE — Unmapped (Signed)
Pt called states she has noticed hair loss/thinning recently. Reviewed with Dr. Margaretmary Bayley, prescription for Biotin 5,000 mcg daily sent to local pharmacy.    Pt also asked for referral to Dermatology sent to [facility]. Referral faxed.     Pt also had questions about a weight loss shake recommended by [facility] called Optifast, looked over shake, unsure if safe. Pt was open to scheduling appointment with Dietitian Greta Doom to discuss weight loss goals. Message sent to scheduler.

## 2020-04-06 NOTE — Unmapped (Signed)
[facility] Clinical Assessment & Refill Coordination Note    Gina Hart, DOB: 18-Jul-1975  Phone: [phone] (home)     All above HIPAA information was verified with patient.     Was a Nurse, learning disability used for this call? No    Specialty Medication(s):   Transplant: cyclosporine 25mg      Current Outpatient Medications   Medication Sig Dispense Refill   ??? acetaminophen (TYLENOL) 325 MG tablet Take 1-2 tablets (325-650 mg total) by mouth every four (4) hours as needed for pain. 100 tablet 0   ??? amLODIPine (NORVASC) 5 MG tablet Take 2 tablets (10 mg total) by mouth nightly. Hold for SBP < 90 and/or MAP < 70 60 tablet 10   ??? aspirin (ECOTRIN) 81 MG tablet TAKE 1 TABLET (81 MG) BY MOUTH DAILY 30 tablet 0   ??? atorvastatin (LIPITOR) 10 MG tablet Take 1 tablet (10 mg total) by mouth daily. 30 tablet 11   ??? biotin 5 mg cap Take 1 capsule (5,000 mcg total) by mouth daily. 30 capsule 11   ??? cyclobenzaprine (FLEXERIL) 10 MG tablet Take 1 tablet (10 mg total) by mouth two (2) times a day as needed for muscle spasms. (Patient not taking: Reported on 10/02/2017) 45 tablet 0   ??? cycloSPORINE modified (NEORAL) 25 MG capsule Take 5 capsules (125 mg total) by mouth two (2) times a day. 300 capsule 11   ??? famotidine (PEPCID) 20 MG tablet Take 1 tablet (20 mg total) by mouth daily as needed for heartburn. 30 tablet 6   ??? fluticasone propionate (FLONASE) 50 mcg/actuation nasal spray 1 spray by Each Nare route daily. 16 g 4   ??? gabapentin (NEURONTIN) 100 MG capsule Take 2 capsules (200 mg total) by mouth Three (3) times a day. (Patient not taking: Reported on 01/02/2018) 180 capsule 11   ??? loratadine (CLARITIN) 10 mg tablet Take 10 mg by mouth daily as needed for allergies.     ??? metoprolol tartrate (LOPRESSOR) 100 MG tablet Take 1 tablet (100 mg total) by mouth Two (2) times a day. 180 tablet 3   ??? MYFORTIC 180 mg EC tablet HOLD (Patient not taking: Reported on 07/09/2019) 60 tablet 11   ??? predniSONE (DELTASONE) 5 MG tablet Take 1 tablet (5 mg total) by mouth daily. 30 tablet 11   ??? rOPINIRole (REQUIP) 0.25 MG tablet Take 1 tablet (0.25 mg total) by mouth nightly. Take 1-3 hours before bedtime. 30 tablet 11   ??? sertraline (ZOLOFT) 100 MG tablet Take 1 tablet (100 mg total) by mouth daily. 90 tablet 3   ??? traMADol (ULTRAM) 50 mg tablet Take 50 mg by mouth every six (6) hours as needed for pain.     ??? traZODone (DESYREL) 50 MG tablet Take 1 tablet (50 mg total) by mouth nightly. 90 tablet 3     No current facility-administered medications for this visit.        Changes to medications: Wilberta reports starting the following medications: requip    Allergies   Allergen Reactions   ??? Ibuprofen Swelling     Other reaction(s): SWELLING/EDEMA       Changes to allergies: No    SPECIALTY MEDICATION ADHERENCE     Cyclosporine 25mg   : 10 days of medicine on hand       Medication Adherence    Patient reported X missed doses in the last month: 0  Specialty Medication: cyclosporine 25mg   Specialty medication(s) dose(s) confirmed: Regimen is correct and unchanged.     Are there any concerns with adherence? No    Adherence counseling provided? Not needed    CLINICAL MANAGEMENT AND INTERVENTION      Clinical Benefit Assessment:    Do you feel the medicine is effective or helping your condition? Yes    Clinical Benefit counseling provided? Not needed    Adverse Effects Assessment:    Are you experiencing any side effects? patient reports hair loss in last week. only new med is requip 1 month ago. has labwork tomorrow. i will message clinic    Are you experiencing difficulty administering your medicine? No    Quality of Life Assessment:    How many days over the past month did your transplant  keep you from your normal activities? For example, brushing your teeth or getting up in the morning. 0    Have you discussed this with your provider? Not needed    Therapy Appropriateness:    Is therapy appropriate? Yes, therapy is appropriate and should be continued    DISEASE/MEDICATION-SPECIFIC INFORMATION      N/A    PATIENT SPECIFIC NEEDS     - Does the patient have any physical, cognitive, or cultural barriers? No    - Is the patient high risk? No    - Does the patient require a Care Management Plan? No     - Does the patient require physician intervention or other additional services (i.e. nutrition, smoking cessation, social work)? No      SHIPPING     Specialty Medication(s) to be Shipped:   Transplant: cyclosporine 25mg     Other medication(s) to be shipped: No additional medications requested for fill at this time     Changes to insurance: No    Delivery Scheduled: Yes, Expected medication delivery date: 04/12/2020.     Medication will be delivered via UPS to the confirmed prescription address in [facility].    The patient will receive a drug information handout for each medication shipped and additional FDA Medication Guides as required.  Verified that patient has previously received a Conservation officer, historic buildings.    All of the patient's questions and concerns have been addressed.    Thad Ranger   [facility] Specialty Pharmacist

## 2020-04-07 ENCOUNTER — Encounter: Admit: 2020-04-07 | Discharge: 2020-04-07 | Payer: MEDICARE | Attending: Registered" | Primary: Registered"

## 2020-04-07 NOTE — Unmapped (Addendum)
Start using you treadmill; slowly build up to 150 minutes weekly and 52 minutes for 5 days per week for weight loss   Limit your sweetened beverages; drink at least 80 ounces of fluids daily  Eat 3 meals daily with lean protein   Read labels to know the salt content of your food    Weight loss goal: 12 pounds by January, 2022

## 2020-04-07 NOTE — Unmapped (Signed)
Pt had appointment with Dietitian Greta Doom, added TSH to labs for tomorrow. Also added EBV Quant PCR. Pt made aware, faxed to [facility] and sent to pt via My Chart.

## 2020-04-07 NOTE — Unmapped (Signed)
[facility]   Medical Nutrition Therapy Consultation       Visit Type:    Initial Assessment    Referral Reason: :  Concern for hair loss and possible malnutrition; weight gain during past 3 years after transplant      Gina Hart is a 44 y.o. female seen for medical nutrition therapy for post transplant medical for possible protein deficiency. Her active problem list, medication list, allergies, family history, notes from last several encounters and lab results were reviewed.     Renal Transplant: 01/11/2017    Her interim medical history is significant for post renal transplant, obesity.    Update from last RD visit on 01/15/17  Weight gain 63 pounds since transplant (at time of transplant weight was 139 pound; BMI 28.19)  Trying to lose weight with increase exercise        Anthropometrics   Height: 149.9 cm (4' 11)   Weight: 91.6 kg (202 lb)  Body mass index is 40.8 kg/m??.    Wt Readings from Last 5 Encounters:   04/07/20 91.6 kg (202 lb)   09/10/18 88.8 kg (195 lb 12.8 oz)   06/21/18 92.4 kg (203 lb 9.6 oz)   01/02/18 87.4 kg (192 lb 9.6 oz)   10/02/17 82.5 kg (181 lb 12.8 oz)        Usual body weight:195-203 pounds for the past year    Ideal Body Weight: 45.45 kg        Nutrition Risk Screening:     Nutrition Focused Physical Exam:      Unable to complete due to COVID 19 precautions  Frailty Index Score:   None provided at this time     Malnutrition Screening:   Unable to complete Malnutrition Assessment at this time due to (comment) (04/07/20 1610)  COVID 19 precautions    Biochemical Data, Medical Tests and Procedures:  All pertinent labs and imaging reviewed by Piedad Climes at 8:16 AM 04/07/2020.    Lab Results   Component Value Date    BUN 20 (H) 02/23/2020    CREATININE 0.90 02/23/2020    GFRAA 82 01/02/2019    GFRNONAA 92 10/10/2018    NA 141 02/23/2020    K 4.1 02/23/2020    CL 106 02/23/2020    CO2 25.3 02/23/2020    CALCIUM 9.2 02/23/2020    PHOS 3.5 02/23/2020 ALBUMIN 4.2 06/10/2019    PTH 117.9 (H) 06/10/2019       No results found for: [facility]    Lab Results   Component Value Date    MG 1.8 02/23/2020    MG 1.8 01/14/2020    MG 1.7 (L) 12/22/2019    PHOS 3.5 02/23/2020    PHOS 3.5 01/14/2020    PHOS 3.2 12/22/2019       Lab Results   Component Value Date    CHOL 183 02/23/2020    HDL 74 (H) 02/23/2020    LDL 88 02/23/2020    TRIG 62 02/23/2020       Lab Results   Component Value Date    A1C 5.2 06/10/2019    A1C 5.2 01/02/2018    A1C 4.8 07/31/2017       Lab Results   Component Value Date    PROTEINUA Negative 02/23/2020    GLUCOSEU Negative 02/23/2020       Lab Results   Component Value Date    HGB 13.7 02/23/2020    HCT 40.5 02/23/2020  Lab Results   Component Value Date    IRON 21 (L) 01/12/2017    TIBC 238.9 (L) 01/12/2017    FERRITIN 1,200.0 (H) 01/12/2017         Medications and Vitamin/Mineral Supplementation:   All nutritionally pertinent medications reviewed on 04/07/2020.   Nutritionally pertinent medications include: atorvastatin, cycloSPORINE, famotidine, predniSONE, sertraline  She is taking nutrition supplements. Biotin started last week  She is not taking myfortic   Aware of food drug interaction  Current Outpatient Medications   Medication Sig Dispense Refill   ??? acetaminophen (TYLENOL) 325 MG tablet Take 1-2 tablets (325-650 mg total) by mouth every four (4) hours as needed for pain. 100 tablet 0   ??? amLODIPine (NORVASC) 5 MG tablet Take 2 tablets (10 mg total) by mouth nightly. Hold for SBP < 90 and/or MAP < 70 60 tablet 10   ??? aspirin (ECOTRIN) 81 MG tablet TAKE 1 TABLET (81 MG) BY MOUTH DAILY 30 tablet 0   ??? atorvastatin (LIPITOR) 10 MG tablet Take 1 tablet (10 mg total) by mouth daily. 30 tablet 11   ??? biotin 5 mg cap Take 1 capsule (5,000 mcg total) by mouth daily. 30 capsule 11   ??? cyclobenzaprine (FLEXERIL) 10 MG tablet Take 1 tablet (10 mg total) by mouth two (2) times a day as needed for muscle spasms. (Patient not taking: Reported on 10/02/2017) 45 tablet 0   ??? cycloSPORINE modified (NEORAL) 25 MG capsule Take 5 capsules (125 mg total) by mouth two (2) times a day. 300 capsule 11   ??? famotidine (PEPCID) 20 MG tablet Take 1 tablet (20 mg total) by mouth daily as needed for heartburn. 30 tablet 6   ??? fluticasone propionate (FLONASE) 50 mcg/actuation nasal spray 1 spray by Each Nare route daily. 16 g 4   ??? gabapentin (NEURONTIN) 100 MG capsule Take 2 capsules (200 mg total) by mouth Three (3) times a day. (Patient not taking: Reported on 01/02/2018) 180 capsule 11   ??? loratadine (CLARITIN) 10 mg tablet Take 10 mg by mouth daily as needed for allergies.     ??? metoprolol tartrate (LOPRESSOR) 100 MG tablet Take 1 tablet (100 mg total) by mouth Two (2) times a day. 180 tablet 3   ??? MYFORTIC 180 mg EC tablet HOLD (Patient not taking: Reported on 07/09/2019) 60 tablet 11   ??? predniSONE (DELTASONE) 5 MG tablet Take 1 tablet (5 mg total) by mouth daily. 30 tablet 11   ??? rOPINIRole (REQUIP) 0.25 MG tablet Take 1 tablet (0.25 mg total) by mouth nightly. Take 1-3 hours before bedtime. 30 tablet 11   ??? sertraline (ZOLOFT) 100 MG tablet Take 1 tablet (100 mg total) by mouth daily. 90 tablet 3   ??? traMADol (ULTRAM) 50 mg tablet Take 50 mg by mouth every six (6) hours as needed for pain.     ??? traZODone (DESYREL) 50 MG tablet Take 1 tablet (50 mg total) by mouth nightly. 90 tablet 3     No current facility-administered medications for this visit.       Nutrition History:     Dietary Restrictions: She avoids intake of grapefruit, pomegranate, star fruit and limit clementine x 1 daily due to drug interaction    Gastrointestinal Issues: Denied issues    Hunger and Satiety: Reports good appetite  Denies chewing, swallowing, nausea, vomiting or diarrhea at this time.     Food Safety and Access: No to little issues noted.     Diet  Recall:   Time Intake   Breakfast Omelet with cheddar and mozzarella  or fast food like [place] pork chop biscuit combo with fries and soda (mt dew) or tea or fried chicken biscuit combo with fries and sweet tea   Snack (AM) oreo cookie or cheese it crackers   Lunch Skips    Snack (PM) Skips or oreo cookie or cheese it crackers    Dinner Chicken breast with bar be que sauce with rice and squash/zucchini with water or mt dew has seconds or mcdonald's quarter pounder with cheese fries and sweet tea regular or [place] (mixed vegetables with fried chicken wings with fried rice) with sweet tea or water   Snack (HS) m and m (peanut) x 1     Food-Related History:  Snacks:  M and Ms; Andris Flurry cookie or cheese it crackers or cheese  Beverages:  mt dew (20 ounces) or sparking grape juice or sweet tea or water   Dining Out:  4/21  Cooking Methods: bake or air fryer or grill  Usual Food Choices: typical listed above  Meal Schedule:  no    Eating Behaviors:  Overeating: Lacks portion control with meals and snacks.       Physical Activity:  Physical activity level is sedentary with little to no exercise.     Daily Estimated Nutrient Needs:  Energy:   1200-1250 kcals [ 23-25 kcal using 45 kg  ,ideal body weight  ]  Protein:   36-45 gm [ 0.8-1 grams using   45 kg  ,ideal body weight  ]  Fluid:   per MD or at least 2.3 L   Sodium:  <2300 mg    Nutrition Goals & Evaluation      Weight loss of at least 12 pounds by January, 2022  (New)  Eat 3 meals daily with lean protein  (New)  Exercise at least 150 minutes weekly  (New)  Drink at least 80 ounces of fluids daily  (Ongoing)  Mindful of sodium consumption for < 2300 mg daily  (New)    Nutrition goals reviewed, and relevant barriers identified and addressed: knowledge deficit . She is evaluated to have good willingness and ability to achieve nutrition goals.     Nutrition Assessment         Current diet is not adequate or appropriate at this time for desired weight loss. Patient skips meals, consumes highly processed foods with little to no exercise.  Patient would benefit from nutritional changes to assist with nutrition goals.    Patient has experienced recent hair loss. I feel at this time her hair loss is not from protein deficiency. Would recommend TSH to determine if possible thyroid deficiency. Can not assess amount of hair loss due to COVID 19 precautions.      Patient is obese (class III). Patient has experienced weight gain since her renal transplant. Patient is weight stable for the past year. Medication may also stimulate appetite. Patient is now aware medication may have caused her increase in appetite thus weight gain with limited physical activity. Patient would benefit from weight loss.      Patient assessed to have nutrition knowledge deficit for weigh loss post renal transplant medical nutrition therapy and would benefit from on-going nutrition education and counseling.        Nutrition Intervention      - Nutrition Education: weight loss post renal transplant medical nutrition therapy diet/eating style. Education resources provided include: handouts promoting nutrition intervention  and contact information   - Meals and Snacks    Nutrition Plan:   Start using you treadmill; slowly build up to 150 minutes weekly and 52 minutes for 5 days per week for weight loss   Limit your sweetened beverages; drink at least 80 ounces of fluids daily  Eat 3 meals daily with lean protein   Read labels to know the salt content of your food    Weight loss goal: 12 pounds by January, 2022    Follow up will occur in 3 months.     Food/Nutrition-related history, Anthropometric measurements, Biochemical data, medical tests, procedures, Nutrition-focused physical findings, Patient understanding or compliance with intervention and recommendations  and Effectiveness of nutrition interventions will be assessed at time of follow-up.         Recommendations for Clinical Team to encourage patient to:  Start using you treadmill; slowly build up to 150 minutes weekly and 52 minutes for 5 days per week for weight loss   Limit your sweetened beverages; drink at least 80 ounces of fluids daily  Eat 3 meals daily with lean protein   Read labels to know the salt content of your food    Weight loss goal: 12 pounds by January, 2022       Time spent 30 minutes       I spent 30 minutes on the real-time audio and video with the patient on the date of service. I spent an additional 15 minutes on pre- and post-visit activities on the date of service.     The patient was physically located in [place] or a state in which I am permitted to provide care. The patient and/or parent/guardian understood that s/he may incur co-pays and cost sharing, and agreed to the telemedicine visit. The visit was reasonable and appropriate under the circumstances given the patient's presentation at the time.    The patient and/or parent/guardian has been advised of the potential risks and limitations of this mode of treatment (including, but not limited to, the absence of in-person examination) and has agreed to be treated using telemedicine. The patient's/patient's family's questions regarding telemedicine have been answered.     If the visit was completed in an ambulatory setting, the patient and/or parent/guardian has also been advised to contact their provider???s office for worsening conditions, and seek emergency medical treatment and/or call 911 if the patient deems either necessary.    I am located on-site and the patient is located off-site for this visit.        Piedad Climes, RD/LDN   Clinical Dietitian  [phone]  [email or web address]

## 2020-04-08 ENCOUNTER — Telehealth: Admit: 2020-04-08 | Payer: MEDICARE | Attending: Nephrology | Primary: Nephrology

## 2020-04-08 NOTE — Unmapped (Signed)
Transplant Nephrology Clinic Visit    ??  Assessment and Plan  Gina Hart is a 44 y.o. female who is s/p deceased donor kidney transplant on 2017/02/10. She is evaluated today by video visit. Active medical issues include:    1. Status post deceased donor kidney transplant on 2017-02-10 with no history of rejection or evidence of FSGS recurrence. Renal function has been stable with serum creatinine 0.90 on 02/23/20 (baseline <1.0 mg/dL). UP/C has been historically normal (0.20 on 02/23/20). DSA screen was negative on 06/10/19 though class II could not be checked due to an interfering substance.    2. EBV viremia with enlarged tonsils. There was no clear evidence of PTLD though this remains a concern. It is reassuring that viral load improved after Myfortic was held, and also there was less tonsillar enlargement by report on the second PET CT scan (direct comparison was not possible though). Will continue to follow periodic EBV viral loads.     3. Immunosuppression. Currently on cyclosporine 125 mg BID and prednisone 5 mg daily. Cyclosporine level 195 on 02/23/20 (target 125-200). Myfortic has been held due to EBV viremia.      4. Hypertension.  On amlodipine 10mg  daily and metoprolol tartrate 100mg  bid. Will continue current antihypertensives.       5. Headaches, improved off tacrolimus.      6. Spinal stenosis, cervical. She will continue to follow in spine clinic.     7. Health Maintenance. Will defer pneumococcal vaccines until next in-person visit. Patient had flu vaccine recently.  COVID-19 vaccination x 2 completed in early May. She will obtain a 3rd dose locally.       8. Memory Loss/Confusion. Brain MRI 07/06/18 was normal. Symptoms initially improved after switching to cyclosporine from tacrolimus, but appear now to be persistent. She continues to have insomnia and sleep apnea may be contributing to this problem. She will continue trazodone and melatonin.     9. History of obstructive sleep apnea, untreated. She was again today encouraged to schedule a local sleep study.     10. Follow up in 3 months with in person visit.     History of Present Illness  Gina Hart is a 44 y.o. female who is s/p deceased kidney transplant on 10-Feb-2017. She has experienced no episodes of rejection with normal urinary protein and baseline creatinine <1.0 mg/dL.     She was first diagnosed with EBV viremia 06/10/19 (Viral load 9010) with symptoms of nasal congestion, and loss of taste and smell. PET CT 06/25/19 revealed symmetric hypermetabolic activity in the lingual tonsils without evidence of lymphadenopathy elsewhere. ENT evaluation on 07/09/19 revealed enlarged tonsils, enlarged turbinates, and congested nasal mucosa. Repeat PET CT at [facility] on 07/11/19 revealed only slightly increased uptake in the lingual tonsils without evidence of malignancy. EBV viral load was 100 on 08/07/19 and 163 on 11/04/19.      Today she is evaluated by video visit. She complains of persistent mild nasal congestion. She denies sore throat, fever, chills, or cough. She is concerned about recent hair loss and is now on biotin. She has gained weight and has fatigue and persistent LE edema. She denies dysuria or allograft tenderness. She still has not had a sleep study.  Headaches remain infrequent (1-2 x/ week) since she switched from tacrolimus to cyclosporine. She continues to have memory deficits and insomnia, but these are not worse.  Home blood pressure has been <130/<80.       Immunosuppressive medication doses are unchanged (  cyclosporine 125 mg bid, prednisone 5 mg daily). She continues to hold Myfortic due to the EBV viremia history.     Transplant History:  1. 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  2. S/P kidney transplant at [facility] January 07, 2107. Deceased donor with KDPI 19%. CMV D+/R-; EBV D+/R+  3. Immunosuppression = campath induction followed by tacrolimus and myfortic maintenance therapy, steroid free regimen.   4. Tacrolimus changed to cyclosporine 07/31/2017 due to neurotoxicity with memory deficits and headaches as primary manifestation.  5. RSV infection 09/10/18  6. EBV viremia 05/2020 (VL 9010) with increased tonsillar uptake on PET CT scans 06/24/20 and 07/11/19. No evidence of PTLD.      Past Medical History:  1. ESRD secondary to FSGS.   2. Hypertension  3. Scoliosis  S/P kidney transplant as stated above    Review of Systems    All other systems are reviewed and are negative.    Medications  Current Outpatient Medications   Medication Sig Dispense Refill   ??? acetaminophen (TYLENOL) 325 MG tablet Take 1-2 tablets (325-650 mg total) by mouth every four (4) hours as needed for pain. 100 tablet 0   ??? amLODIPine (NORVASC) 5 MG tablet Take 2 tablets (10 mg total) by mouth nightly. Hold for SBP < 90 and/or MAP < 70 60 tablet 10   ??? aspirin (ECOTRIN) 81 MG tablet TAKE 1 TABLET (81 MG) BY MOUTH DAILY 30 tablet 0   ??? atorvastatin (LIPITOR) 10 MG tablet Take 1 tablet (10 mg total) by mouth daily. 30 tablet 11   ??? biotin 5 mg cap Take 1 capsule (5,000 mcg total) by mouth daily. 30 capsule 11   ??? cyclobenzaprine (FLEXERIL) 10 MG tablet Take 1 tablet (10 mg total) by mouth two (2) times a day as needed for muscle spasms. (Patient not taking: Reported on 10/02/2017) 45 tablet 0   ??? cycloSPORINE modified (NEORAL) 25 MG capsule Take 5 capsules (125 mg total) by mouth two (2) times a day. 300 capsule 11   ??? famotidine (PEPCID) 20 MG tablet Take 1 tablet (20 mg total) by mouth daily as needed for heartburn. 30 tablet 6   ??? fluticasone propionate (FLONASE) 50 mcg/actuation nasal spray 1 spray by Each Nare route daily. 16 g 4   ??? gabapentin (NEURONTIN) 100 MG capsule Take 2 capsules (200 mg total) by mouth Three (3) times a day. (Patient not taking: Reported on 01/02/2018) 180 capsule 11   ??? loratadine (CLARITIN) 10 mg tablet Take 10 mg by mouth daily as needed for allergies.     ??? metoprolol tartrate (LOPRESSOR) 100 MG tablet Take 1 tablet (100 mg total) by mouth Two (2) times a day. 180 tablet 3   ??? MYFORTIC 180 mg EC tablet HOLD (Patient not taking: Reported on 07/09/2019) 60 tablet 11   ??? predniSONE (DELTASONE) 5 MG tablet Take 1 tablet (5 mg total) by mouth daily. 30 tablet 11   ??? rOPINIRole (REQUIP) 0.25 MG tablet Take 1 tablet (0.25 mg total) by mouth nightly. Take 1-3 hours before bedtime. 30 tablet 11   ??? sertraline (ZOLOFT) 100 MG tablet Take 1 tablet (100 mg total) by mouth daily. 90 tablet 3   ??? traMADol (ULTRAM) 50 mg tablet Take 50 mg by mouth every six (6) hours as needed for pain.     ??? traZODone (DESYREL) 50 MG tablet Take 1 tablet (50 mg total) by mouth nightly. 90 tablet 3     No  current facility-administered medications for this visit.       Physical Exam    Patient appears in no distress on video visit.     Laboratory Results  No results found for this or any previous visit (from the past 170 hour(s)).    Video Visit:    I spent 19 minutes on the audio/video with the patient. I spent an additional 15 minutes on pre- and post-visit activities. I was present in the [facility] clinic during this visit. I was present in the [facility] clinic during this visit.     The patient was physically located in [place] or a state in which I am permitted to provide care. The patient and/or parent/gauardian understood that s/he may incur co-pays and cost sharing, and agreed to the telemedicine visit. The visit was completed via phone and/or video, which was appropriate and reasonable under the circumstances given the patient's presentation at the time.    The patient and/or parent/guardian has been advised of the potential risks and limitations of this mode of treatment (including, but not limited to, the absence of in-person examination) and has agreed to be treated using telemedicine. The patient's/patient's family's questions regarding telemedicine have been answered.     If the phone/video visit was completed in an ambulatory setting, the patient and/or parent/guardian has also been advised to contact their provider???s office for worsening conditions, and seek emergency medical treatment and/or call 911 if the patient deems either necessary.

## 2020-04-09 MED FILL — CYCLOSPORINE MODIFIED 25 MG CAPSULE: 30 days supply | Qty: 300 | Fill #8 | Status: AC

## 2020-04-09 MED FILL — CYCLOSPORINE MODIFIED 25 MG CAPSULE: ORAL | 30 days supply | Qty: 300 | Fill #8

## 2020-04-13 NOTE — Unmapped (Signed)
Pt called, states [facility] did not receive referral sent 10/8. Will re-fax referral.     Pt also asked if labs received from 10/14. Called [facility], provided them the fax number and they will fax labs over.

## 2020-04-14 LAB — EPSTEIN-BARR VIRUS ANTIBODY PROFILE: EBV VCA IGM AB, S: 82.5

## 2020-04-14 LAB — EBV VCA IGM AB, S: Lab: 82.5

## 2020-05-05 DIAGNOSIS — Z940 Kidney transplant status: Principal | ICD-10-CM

## 2020-05-05 DIAGNOSIS — R413 Other amnesia: Principal | ICD-10-CM

## 2020-05-05 DIAGNOSIS — Z79899 Other long term (current) drug therapy: Principal | ICD-10-CM

## 2020-05-05 DIAGNOSIS — R519 Intractable headache, unspecified chronicity pattern, unspecified headache type: Principal | ICD-10-CM

## 2020-05-05 NOTE — Unmapped (Signed)
Pt called c/o headaches over past several days, sometimes continues on throughout day. Pt states she has not taken anything for relief. Advised she try taking Tylenol 1000mg  PRN. Pt also states her memory issues have gotten worse.     Reviewed with Dr. Margaretmary Bayley, pt needs MRI Brain WO contrast and referral to Neurology.     Pt states she would like to complete MRI at [facility] and Neurology consult sent to [facility].     Referral/orders faxed.

## 2020-05-07 NOTE — Unmapped (Signed)
[facility] Refill Coordination Note    Specialty Medication(s) to be Shipped:   Transplant: cyclosporine 25mg     Other medication(s) to be shipped: No additional medications requested for fill at this time     Gina Hart, DOB: September 07, 1975  Phone: [phone] (home)       All above HIPAA information was verified with patient.     Was a Nurse, learning disability used for this call? No    Completed refill call assessment today to schedule patient's medication shipment from the [facility] [phone]).       Specialty medication(s) and dose(s) confirmed: Regimen is correct and unchanged.   Changes to medications: Kentley reports no changes at this time.  Changes to insurance: No  Questions for the pharmacist: No    Confirmed patient received Welcome Packet with first shipment. The patient will receive a drug information handout for each medication shipped and additional FDA Medication Guides as required.       DISEASE/MEDICATION-SPECIFIC INFORMATION        N/A    SPECIALTY MEDICATION ADHERENCE     Medication Adherence    Patient reported X missed doses in the last month: 2  Specialty Medication: Cyclosporine 25mg   Patient is on additional specialty medications: No        Cyclosporine 25 mg: 10 days of medicine on hand     SHIPPING     Shipping address confirmed in Epic.     Delivery Scheduled: Yes, Expected medication delivery date: 05/13/2020.     Medication will be delivered via UPS to the prescription address in Epic WAM.    Lorelei Pont [facility] Specialty Technician

## 2020-05-10 NOTE — Unmapped (Signed)
[facility] to confirm received referral to neurology, they did. DNA with call patient to schedule today. (P) [phone] (F) [phone]    Called [facility] to confirm they received MRI referral. Unable to reach. Left VM with call back number. (P) [phone] (F) [phone].     Pt made aware.

## 2020-05-12 MED FILL — CYCLOSPORINE MODIFIED 25 MG CAPSULE: ORAL | 30 days supply | Qty: 300 | Fill #9

## 2020-05-12 MED FILL — CYCLOSPORINE MODIFIED 25 MG CAPSULE: 30 days supply | Qty: 300 | Fill #9 | Status: AC

## 2020-05-26 DIAGNOSIS — Z940 Kidney transplant status: Principal | ICD-10-CM

## 2020-05-26 MED ORDER — AMLODIPINE 5 MG TABLET
ORAL_TABLET | 11 refills | 0 days | Status: CP
Start: 2020-05-26 — End: ?

## 2020-05-26 NOTE — Unmapped (Signed)
Pt request for RX Refill : AMLODIPINE BESYLATE 5 MG TAB

## 2020-05-27 LAB — BASIC METABOLIC PANEL
ANION GAP: 9.1 mmol/L (ref 2.0–15.0)
BLOOD UREA NITROGEN: 27 — ABNORMAL HIGH (ref 7–18)
BUN / CREAT RATIO: 30 — ABNORMAL HIGH (ref 10.00–16.00)
CALCIUM: 9.4 (ref 8.5–10.1)
CHLORIDE: 109 mmol/L — ABNORMAL HIGH (ref 100–108)
CO2: 25.9 mmol/L (ref 21–32)
CREATININE: 0.9 (ref 0.6–1.3)
EGFR CKD-EPI AA MALE: 82
GLUCOSE RANDOM: 103 mg/dL — ABNORMAL HIGH (ref 65–99)
MAGNESIUM: 1.7 — ABNORMAL LOW (ref 1.8–2.4)
PHOSPHORUS: 3.2 (ref 2.6–4.7)
POTASSIUM: 3.9 mmol/L (ref 3.5–5.1)
SODIUM: 144 mmol/L (ref 136–145)

## 2020-05-27 LAB — URINALYSIS WITH CULTURE REFLEX
BILIRUBIN UA: NEGATIVE
BLOOD UA: NEGATIVE
GLUCOSE UA: NEGATIVE
KETONES UA: NEGATIVE
LEUKOCYTE ESTERASE UA: NEGATIVE
NITRITE UA: NEGATIVE
PH UA: 5.5 (ref 4.5–8)
PROTEIN UA: NEGATIVE
SPECIFIC GRAVITY UA: 1.025 (ref 1.005–1.030)
UROBILINOGEN UA: 0.2

## 2020-05-27 LAB — LIPID PANEL
CHOLESTEROL: 194
HDL CHOLESTEROL: 70 — ABNORMAL HIGH (ref 35–60)
LDL CHOLESTEROL CALCULATED: 102 (ref 5.0–130.0)
TRIGLYCERIDES: 112 (ref 30–200)

## 2020-05-27 LAB — PROTEIN / CREATININE RATIO, URINE
CREATININE, URINE: 142.9
PROTEIN URINE: 17.1
PROTEIN/CREAT RATIO, URINE: 0.1

## 2020-05-27 LAB — CBC W/ DIFFERENTIAL
BASOPHILS ABSOLUTE COUNT: 0 ul — ABNORMAL LOW (ref 0.01–0.09)
BASOPHILS RELATIVE PERCENT: 0.3 % (ref 0.0–2.0)
EOSINOPHILS ABSOLUTE COUNT: 0.2 10*3/uL (ref 0.04–0.5)
EOSINOPHILS RELATIVE PERCENT: 4.6 % (ref 1.0–8.0)
HEMATOCRIT: 38.4 (ref 35–48)
HEMOGLOBIN: 13 g/dL (ref 11.9–16.0)
LYMPHOCYTES ABSOLUTE COUNT: 0.8 10*3/uL — ABNORMAL LOW (ref 1.0–2.7)
LYMPHOCYTES RELATIVE PERCENT: 18.9 % — ABNORMAL LOW (ref 20–45)
MEAN CORPUSCULAR HEMOGLOBIN CONC: 33.9 g/dL (ref 33.3–35.5)
MEAN CORPUSCULAR HEMOGLOBIN: 30.3 pg (ref 25.8–33.0)
MEAN CORPUSCULAR VOLUME: 89.3 fl (ref 78–95)
MEAN PLATELET VOLUME: 8.5 fl (ref 7.5–11.2)
MONOCYTES ABSOLUTE COUNT: 0.7 10*3/uL (ref 0.2–0.8)
MONOCYTES RELATIVE PERCENT: 15.7 % — ABNORMAL HIGH (ref 4.0–12.0)
NEUTROPHILS ABSOLUTE COUNT: 2.6
NEUTROPHILS RELATIVE PERCENT: 60.5 % (ref 42–74)
PLATELET COUNT: 210 10*3/uL (ref 150–480)
RED BLOOD CELL COUNT: 4.3 (ref 4.0–5.3)
RED CELL DISTRIBUTION WIDTH: 13 % (ref 12.1–15.0)
WHITE BLOOD CELL COUNT: 4.2 10*3/uL (ref 4.0–10.5)

## 2020-05-29 LAB — EBV QUANTITATIVE PCR, BLOOD
EPSTEIN-BARR DNA QUANT, PCR: 269 {copies}/mL — ABNORMAL HIGH
LOG10 EBV DNA QN PCR: 2.43

## 2020-05-31 ENCOUNTER — Other Ambulatory Visit: Payer: Self-pay | Admitting: Nephrology

## 2020-05-31 DIAGNOSIS — R519 Headache, unspecified: Secondary | ICD-10-CM

## 2020-05-31 DIAGNOSIS — R413 Other amnesia: Secondary | ICD-10-CM

## 2020-05-31 NOTE — Unmapped (Signed)
Received call from patient stating she was unable to complete MRI at [facility] due to equipment issues. Pt would like referral faxed to [facility]) [phone], (F) [phone].     Pt also called [facility] and was unable to schedule until 07/2020. Pt would like referral sent to location in [place]. Referral faxed to [facility] (P) [phone], (F) [phone].     Called to confirm referrals received. [facility] received MRI order, pt can call to schedule at [phone]. [facility] received referral, tried to call patient, unable to reach and mail box is full. Called patient, provided numbers to schedule.

## 2020-06-03 NOTE — Unmapped (Signed)
[facility] Refill Coordination Note    Specialty Medication(s) to be Shipped:   Transplant: cyclosporine 25mg     Other medication(s) to be shipped: No additional medications requested for fill at this time     Gina Hart, DOB: [birth year]  Phone: [phone] (home)       All above HIPAA information was verified with patient.     Was a Nurse, learning disability used for this call? No    Completed refill call assessment today to schedule patient's medication shipment from the [facility] [phone]).       Specialty medication(s) and dose(s) confirmed: Regimen is correct and unchanged.   Changes to medications: Gina Hart reports no changes at this time.  Changes to insurance: No  Questions for the pharmacist: No    Confirmed patient received Welcome Packet with first shipment. The patient will receive a drug information handout for each medication shipped and additional FDA Medication Guides as required.       DISEASE/MEDICATION-SPECIFIC INFORMATION        N/A    SPECIALTY MEDICATION ADHERENCE     Medication Adherence    Patient reported X missed doses in the last month: 0  Specialty Medication: Cyclosporine 25mg   Patient is on additional specialty medications: No        Cyclosporine 25 mg: 8 days of medicine on hand     SHIPPING     Shipping address confirmed in Epic.     Delivery Scheduled: Yes, Expected medication delivery date: 06/08/2020.     Medication will be delivered via UPS to the prescription address in Epic WAM.    Gina Hart [facility] Specialty Technician

## 2020-06-04 ENCOUNTER — Other Ambulatory Visit: Payer: Self-pay | Admitting: Family Medicine

## 2020-06-04 DIAGNOSIS — Z1231 Encounter for screening mammogram for malignant neoplasm of breast: Secondary | ICD-10-CM

## 2020-06-07 MED FILL — CYCLOSPORINE MODIFIED 25 MG CAPSULE: ORAL | 30 days supply | Qty: 300 | Fill #10

## 2020-06-07 MED FILL — CYCLOSPORINE MODIFIED 25 MG CAPSULE: 30 days supply | Qty: 300 | Fill #10 | Status: AC

## 2020-06-08 DIAGNOSIS — G4700 Insomnia, unspecified: Principal | ICD-10-CM

## 2020-06-08 DIAGNOSIS — Z940 Kidney transplant status: Principal | ICD-10-CM

## 2020-06-08 MED ORDER — TRAZODONE 50 MG TABLET
ORAL_TABLET | Freq: Every evening | ORAL | 3 refills | 90.00000 days | Status: CP
Start: 2020-06-08 — End: 2021-06-08

## 2020-06-08 NOTE — Unmapped (Signed)
Received call from patient requesting refill on Trazodone. Pt also asked about stopping Zoloft. Pt states she spoke with a nurse who suggested Zoloft over time can cause confusion and memory loss. Reviewed with Dr. Margaretmary Bayley, Zoloft would not be effecting her memory or cause confusion. If patient wants to stop Zoloft she should decrease to 50mg  x 7 days, then decrease to 25mg  x 7 days then stop. Pt verbalized understanding.      Pt also asked for the 3rd time for the phone numbers to [facility] and [facility], referrals recently faxed for MRI and Neurology. After providing phone numbers and encouraging patient to contact both entities for appointments, pt found that she had contact both and scheduled appointments. Pt has appointment with [facility] for MRI on 12/21 at 10:30am and [facility]  2/3 at 8:15am.

## 2020-06-15 ENCOUNTER — Other Ambulatory Visit: Payer: Self-pay

## 2020-06-15 ENCOUNTER — Ambulatory Visit (HOSPITAL_COMMUNITY): Payer: Medicare Other

## 2020-06-15 ENCOUNTER — Ambulatory Visit: Payer: Medicare Other

## 2020-06-15 ENCOUNTER — Ambulatory Visit
Admission: RE | Admit: 2020-06-15 | Discharge: 2020-06-15 | Disposition: A | Discharge status: Home / Self Care | Payer: Medicare Other | Source: Ambulatory Visit | Attending: Nephrology | Admitting: Nephrology

## 2020-06-15 DIAGNOSIS — R413 Other amnesia: Secondary | ICD-10-CM | POA: Diagnosis present

## 2020-06-15 DIAGNOSIS — R519 Headache, unspecified: Secondary | ICD-10-CM

## 2020-06-23 NOTE — Unmapped (Signed)
Pt left message for call back to review MRI results. MRI results with [facility] negative/normal. Pt reminded about [facility] appointment on 2/3 at 8:15am for headaches/confusion. Pt verbalized understanding.     While speaking with her, she states that her vision is normal throughout the day, but in the evening/night her vision gets a little blurry. Pt states she has not been to Ophthalmologist in several years. Encouraged pt to schedule appointment with Ophthalmology. Pt agreed with plan.

## 2020-07-03 DIAGNOSIS — I10 Essential (primary) hypertension: Principal | ICD-10-CM

## 2020-07-03 DIAGNOSIS — Z940 Kidney transplant status: Principal | ICD-10-CM

## 2020-07-03 MED ORDER — METOPROLOL TARTRATE 100 MG TABLET
ORAL_TABLET | 0 refills | 0 days
Start: 2020-07-03 — End: ?

## 2020-07-05 DIAGNOSIS — Z940 Kidney transplant status: Principal | ICD-10-CM

## 2020-07-05 DIAGNOSIS — I10 Essential (primary) hypertension: Principal | ICD-10-CM

## 2020-07-05 MED ORDER — METOPROLOL TARTRATE 100 MG TABLET
ORAL_TABLET | Freq: Two times a day (BID) | ORAL | 0 refills | 0.00000 days | Status: CP
Start: 2020-07-05 — End: 2021-07-05

## 2020-07-07 NOTE — Unmapped (Signed)
[facility] Refill Coordination Note    Specialty Medication(s) to be Shipped:   Transplant: cyclosporine 25mg     Other medication(s) to be shipped: No additional medications requested for fill at this time     Gina Hart, DOB: 08/29/1975  Phone: [phone] (home)       All above HIPAA information was verified with patient.     Was a Nurse, learning disability used for this call? No    Completed refill call assessment today to schedule patient's medication shipment from the [facility] [phone]).       Specialty medication(s) and dose(s) confirmed: Regimen is correct and unchanged.   Changes to medications: [name] reports no changes at this time.  Changes to insurance: No  Questions for the pharmacist: No    Confirmed patient received Welcome Packet with first shipment. The patient will receive a drug information handout for each medication shipped and additional FDA Medication Guides as required.       DISEASE/MEDICATION-SPECIFIC INFORMATION        N/A    SPECIALTY MEDICATION ADHERENCE     Medication Adherence    Patient reported X missed doses in the last month: 2  Specialty Medication: CYCLOSPORINE 25MG    Patient is on additional specialty medications: No  Informant: patient  Reasons for non-adherence: patient forgets  Confirmed plan for next specialty medication refill: delivery by pharmacy  Refills needed for supportive medications: not needed          Refill Coordination    Has the Patients' Contact Information Changed: No  Is the Shipping Address Different: No         CYCLOSPORINE 25 MG 9 DAYS REMAINING        SHIPPING     Shipping address confirmed in Epic.     Delivery Scheduled: Yes, Expected medication delivery date: 1/14.     Medication will be delivered via UPS to the prescription address in Epic WAM.    Jolene Schimke   [facility] Specialty Technician

## 2020-07-08 ENCOUNTER — Telehealth (INDEPENDENT_AMBULATORY_CARE_PROVIDER_SITE_OTHER): Payer: Self-pay | Admitting: Gastroenterology

## 2020-07-08 ENCOUNTER — Other Ambulatory Visit: Payer: Self-pay

## 2020-07-08 DIAGNOSIS — Z1211 Encounter for screening for malignant neoplasm of colon: Secondary | ICD-10-CM

## 2020-07-08 MED ORDER — NA SULFATE-K SULFATE-MG SULF 17.5-3.13-1.6 GM/177ML PO SOLN: 1.0000 | Freq: Once | ORAL | 0 refills | Status: AC

## 2020-07-08 MED FILL — CYCLOSPORINE MODIFIED 25 MG CAPSULE: ORAL | 30 days supply | Qty: 300 | Fill #11

## 2020-07-08 NOTE — Progress Notes (Signed)
Gastroenterology Pre-Procedure Review  Request Date: 08/13/20 Requesting Physician: Dr. Allen Norris  PATIENT REVIEW QUESTIONS: The patient responded to the following health history questions as indicated:    1. Are you having any GI issues? no 2. Do you have a personal history of Polyps? no 3. Do you have a family history of Colon Cancer or Polyps? no 4. Diabetes Mellitus? no 5. Joint replacements in the past 12 months?no 6. Major health problems in the past 3 months?no 7. Any artificial heart valves, MVP, or defibrillator?no    MEDICATIONS & ALLERGIES:    Patient reports the following regarding taking any anticoagulation/antiplatelet therapy:   Plavix, Coumadin, Eliquis, Xarelto, Lovenox, Pradaxa, Brilinta, or Effient? no Aspirin? yes (81 mg daily)  Patient confirms/reports the following medications:  Current Outpatient Medications  Medication Sig Dispense Refill  . acetaminophen (TYLENOL) 500 MG tablet Take 500 mg by mouth every 6 (six) hours as needed.    Marland Kitchen amLODipine (NORVASC) 5 MG tablet Take 10 mg by mouth daily.    Marland Kitchen aspirin EC 81 MG tablet Take 81 mg by mouth daily.    Marland Kitchen atorvastatin (LIPITOR) 10 MG tablet Take 10 mg by mouth daily.    Marland Kitchen BIOTIN MAXIMUM STRENGTH 10000 MCG TABS Take by mouth.    . cyclobenzaprine (FLEXERIL) 10 MG tablet Take 10 mg by mouth 2 (two) times daily.    . famotidine (PEPCID) 20 MG tablet Take 20 mg by mouth every morning.    . loratadine (CLARITIN) 10 MG tablet Take 10 mg by mouth daily as needed for allergies.    . metoprolol tartrate (LOPRESSOR) 100 MG tablet Take 100 mg by mouth 2 (two) times daily.    . predniSONE (DELTASONE) 5 MG tablet Take 5 mg by mouth daily.    Marland Kitchen rOPINIRole (REQUIP) 0.25 MG tablet Take 0.25 mg by mouth at bedtime.    . TRAZODONE HCL PO Take 50 mg/day by mouth daily.    Marland Kitchen gabapentin (NEURONTIN) 100 MG capsule Take 200 mg by mouth 3 (three) times daily. (Patient not taking: No sig reported)    . mycophenolate (MYFORTIC) 180 MG EC  tablet Take 180 mg by mouth 2 (two) times daily. (Patient not taking: No sig reported)    . sertraline (ZOLOFT) 100 MG tablet Take 100 mg by mouth daily. (Patient not taking: No sig reported)    . tacrolimus (PROGRAF) 1 MG capsule Take 1 mg by mouth 2 (two) times daily. (Patient not taking: No sig reported)     No current facility-administered medications for this visit.    Patient confirms/reports the following allergies:  Allergies  Allergen Reactions  . Ibuprofen Swelling  . Sulfa Antibiotics     No orders of the defined types were placed in this encounter.   AUTHORIZATION INFORMATION Primary Insurance: 1D#: Group #:  Secondary Insurance: 1D#: Group #:  SCHEDULE INFORMATION: Date: 08/13/20 Time: Location:ms[facility]

## 2020-07-10 ENCOUNTER — Other Ambulatory Visit: Payer: Self-pay

## 2020-07-10 ENCOUNTER — Telehealth: Payer: Self-pay

## 2020-07-10 MED ORDER — NA SULFATE-K SULFATE-MG SULF 17.5-3.13-1.6 GM/177ML PO SOLN: 1.0000 | Freq: Once | ORAL | 0 refills | Status: AC

## 2020-07-10 NOTE — Telephone Encounter (Signed)
Patient LVM 07/09/20 asking to resend the rx because she told the pharmacist she did not need it.  She checked with her insurance to see if her colonoscopy would be covered and it was.  New rx was sent to pharmacy. Patient has been notified.  Thanks,  [place], [place]

## 2020-07-20 DIAGNOSIS — Z79899 Other long term (current) drug therapy: Principal | ICD-10-CM

## 2020-07-20 DIAGNOSIS — Z940 Kidney transplant status: Principal | ICD-10-CM

## 2020-07-20 NOTE — Unmapped (Signed)
Addended by: Lindi Adie on: 07/20/2020 02:47 PM     Modules accepted: Orders

## 2020-07-28 ENCOUNTER — Telehealth: Payer: Self-pay

## 2020-07-28 ENCOUNTER — Encounter: Payer: Self-pay | Admitting: Gastroenterology

## 2020-07-28 NOTE — Telephone Encounter (Signed)
Contacted patient in regard to colonoscopy prep questions she had.  However I was not able to reach her due to her voicemail being full.  Thanks,  [place], [place]

## 2020-07-30 ENCOUNTER — Encounter: Payer: Self-pay | Admitting: Gastroenterology

## 2020-08-02 ENCOUNTER — Encounter: Payer: Self-pay | Admitting: Anesthesiology

## 2020-08-10 DIAGNOSIS — Z940 Kidney transplant status: Principal | ICD-10-CM

## 2020-08-10 LAB — CYCLOSPORINE LEVEL: CYCLOSPORINE (FPIA) BLOOD: 196 ng/mL (ref 100–400)

## 2020-08-10 MED ORDER — CYCLOSPORINE MODIFIED 25 MG CAPSULE
ORAL_CAPSULE | Freq: Two times a day (BID) | ORAL | 11 refills | 30 days | Status: CP
Start: 2020-08-10 — End: 2021-08-10
  Filled 2020-08-11: qty 300, 30d supply, fill #0

## 2020-08-10 NOTE — Unmapped (Signed)
Pt request for RX Refill cycloSPORINE modified (NEORAL) 25 MG capsule

## 2020-08-10 NOTE — Unmapped (Signed)
Pt called states on Friday she started having sinus headache, fatigue, chills, possible fever. No N/V/D, SOB, CP, cough. Pt states she has not been able to drink and eat because she is not interested. Pt states past 3 days she has been drinking 2.5 bottles of water per day. Pt told she needs a COVID-19 test. This Clinical research associate recommended she treat food and fluids like medicine and try to increase intake. Pt states she can't do it, because she is turned off by the idea of eating or drinking anything. Pt states she feels dehydrated and feels like she needs fluids. Pt told she would need to go to her local ED for evaluation if she is unable to eat/drink and if she feels like she needs IV fluids. Pt states she is going to go to a local ED/Urgent care for evaluation.

## 2020-08-10 NOTE — Unmapped (Signed)
[facility] Refill Coordination Note    Specialty Medication(s) to be Shipped:   Transplant: cyclosporine 25mg     Other medication(s) to be shipped: No additional medications requested for fill at this time     Kerstie Agent, DOB: 1976/06/05  Phone: [phone] (home)       All above HIPAA information was verified with patient.     Was a Nurse, learning disability used for this call? No    Completed refill call assessment today to schedule patient's medication shipment from the [facility] [phone]).       Specialty medication(s) and dose(s) confirmed: Regimen is correct and unchanged.   Changes to medications: Elisabet reports no changes at this time.  Changes to insurance: No  Questions for the pharmacist: No    Confirmed patient received Welcome Packet with first shipment. The patient will receive a drug information handout for each medication shipped and additional FDA Medication Guides as required.       DISEASE/MEDICATION-SPECIFIC INFORMATION        N/A    SPECIALTY MEDICATION ADHERENCE     Medication Adherence    Patient reported X missed doses in the last month: 0  Specialty Medication: cycloSPORINE modified (NEORAL) 25 MG capsule (Expired)  Patient is on additional specialty medications: No  Any gaps in refill history greater than 2 weeks in the last 3 months: no  Demonstrates understanding of importance of adherence: yes  Informant: patient  Reliability of informant: reliable  Confirmed plan for next specialty medication refill: delivery by pharmacy  Refills needed for supportive medications: not needed              Cyclosporine 25 mg: 6 days of medicine on hand        SHIPPING     Shipping address confirmed in Epic.     Delivery Scheduled: Yes, Expected medication delivery date: 08/12/2020.  However, Rx request for refills was sent to the provider as there are none remaining.     Medication will be delivered via UPS to the prescription address in Epic WAM.    Kiegan Macaraeg D Brunetta Newingham   [facility] Specialty Technician

## 2020-08-11 ENCOUNTER — Ambulatory Visit: Admit: 2020-08-11 | Payer: MEDICARE

## 2020-08-11 DIAGNOSIS — U071 COVID-19 virus infection: Principal | ICD-10-CM

## 2020-08-11 DIAGNOSIS — Z940 Kidney transplant status: Principal | ICD-10-CM

## 2020-08-11 DIAGNOSIS — Z79899 Other long term (current) drug therapy: Principal | ICD-10-CM

## 2020-08-11 NOTE — Unmapped (Signed)
Pt called with a + COVID test, she stated her urine is concentrated but per her report her labs are ok and can't eat or drink, encouraged her to drink and stay hydrated. Will route to primary TNC to get plan for immunosuppression.

## 2020-08-11 NOTE — Unmapped (Signed)
COVID Infusion Therapy Questionnaire    Have you received a positive COVID test result (excluding antibody testing) AND are currently in an outpatient setting? Yes  Do you have at least one mild or moderate COVID symptom that began no more than 10 days ago? Yes  What date did you test COVID+ (mm/dd/yyyy): 08/10/2020  What date did your symptoms start (mm/dd/yyyy): 08/07/2020  Which of the following COVID symptoms are you experiencing: Muscle/body aches and Nasal congestion, loss of smell   Do you (1) have new oxygen requirements or (2) increased oxygen requirement due to COVID-19? No   Patient is under 81 years of age? No      Vaccination status:  ??? Patient has received a COVID-19 vaccine: Yes  ??? Manufacturer: Moderna  ??? # of doses received: 2 doses  ??? What date did you receive your last dose (mm/dd/yyyy):       Screening for COVID Therapeutics (18 and up)    ??? Immunosuppressive disease (e.g., cancer, not in remission; solid organ transplant; HIV; CD4 <200 cells/m3): Yes- [facility] transplant patient         Resolution  Patient qualifies - Because you meet the prioritized eligibility criteria at [facility] at this time, we can offer treatment. These treatments may help to prevent progression of mild-to-moderate symptoms to severe symptoms and/or hospitalization in those who are confirmed high-risk COVID (+) and are within 5- 10 days of symptom onset. The U.S. FDA has issued an Emergency Use Authorization to permit the emergency use of these unapproved treatments. At this time there are no approved and available alternative treatments for out-patients. The options offered are based on drug and appointment availability. Sotrovimab is a one-time IV infusion of monoclonal antibodies, the infusion needs to be given by day 10.  You will have standard copays, deductibles or coinsurance out-of-pocket cost sharing for these services as directed by your medical insurance plan. (Use available documentation to answer any questions.)   Patient Agreed to therapy. Scheduling notified.  Patient County: [place]

## 2020-08-11 NOTE — Unmapped (Signed)
Pt called back she is COVID-19 positive. Order for [employer] entered, requested Sotrovimab. Dr. Margaretmary Bayley aware. Pt made aware, someone from [facility] will call to assess.

## 2020-08-12 ENCOUNTER — Other Ambulatory Visit: Admission: RE | Admit: 2020-08-12 | Payer: Medicare Other | Source: Ambulatory Visit

## 2020-08-12 ENCOUNTER — Encounter: Admit: 2020-08-12 | Discharge: 2020-08-13 | Payer: MEDICARE

## 2020-08-12 LAB — EBV QUANTITATIVE PCR, BLOOD
EPSTEIN-BARR DNA QUANT, PCR: 35 [IU]/mL
LOG10 EBV DNA QN PCR: DETECTED

## 2020-08-12 MED ADMIN — sotrovimab 500 mg in sodium chloride (NS) 0.9 % 100 mL IVPB PREMADE: 500 mg | INTRAVENOUS | @ 18:00:00 | Stop: 2020-08-12

## 2020-08-12 NOTE — Unmapped (Signed)
Post Infusion Contacts     After your Regeneron therapy, you can contact your primary care provider or our Clinical Contact Center [phone] for minor symptoms.   For severe symptoms, please dial 911.      Those receiving monoclonal or plasma products should wait 90 days until getting immunized.      Thank you for choosing [facility] at [facility]         ??FACT SHEET FOR PATIENTS, PARENTS, AND CAREGIVERS   Emergency Use Authorization (EUA) of Sotrovimab for the Treatment of Coronavirus Disease 2019 (COVID-19)   You are being given a medicine called sotrovimab for the treatment of coronavirus disease 2019 (COVID-19). This Fact Sheet contains information to help you understand the potential risks and potential benefits of taking sotrovimab, which you may receive.   Receiving sotrovimab may benefit certain people with COVID-19.   Read this Fact Sheet for information about sotrovimab. Talk to your healthcare provider if you have any questions. It is your choice to receive sotrovimab or stop it at any time.   What is COVID-19?     COVID-19 is caused by a virus called a coronavirus. People can get COVID-19 through contact with another person who has the virus.   COVID-19 illnesses have ranged from very mild (including some with no reported symptoms) to severe, including illness resulting in death. While information so far suggests that most COVID-19 illness is mild, serious illness can happen and may cause other medical conditions to become worse. People of all ages with severe, long-lasting (chronic) medical conditions like heart disease, lung disease, diabetes, for example, and other conditions including obesity, seem to be at higher risk of being hospitalized for COVID-19. Older age, with or without other conditions, also places people at higher risk of being hospitalized for COVID-19.   What are the symptoms of COVID-19?     The symptoms of COVID-19 are fever, cough, and shortness of breath, which may appear 2 to 14 days after exposure. Serious illness, including breathing problems, can occur and may cause your other medical conditions to become worse.   What is sotrovimab?     Sotrovimab is an investigational medicine used to treat mild-to-moderate symptoms of COVID-19 in adults and children (17 years of age and older weighing at least 88 pounds [40 kg]) with positive results of direct SARS-CoV-2 viral testing, and who are at high risk of progression to severe COVID-19, including hospitalization or death. Sotrovimab is investigational because it is still being studied. There is limited information about the safety and effectiveness of using sotrovimab to treat people with mild-to-moderate COVID-19.   The U.S. Food & Drug Administration (FDA) has authorized the emergency use of sotrovimab for the treatment of COVID-19 under an Emergency Use Authorization (EUA). For more information on EUA, see the What is an Emergency Use Authorization (EUA)? section at the end of this Fact Sheet.     Who should not receive sotrovimab?   Do not take sotrovimab if you have had a serious allergic reaction to sotrovimab or to any of the ingredients in sotrovimab.     What are the ingredients in sotrovimab?   Active ingredient: sotrovimab   Inactive ingredients: L-histidine, L-histidine monohydrochloride, L-methionine, polysorbate 80, and sucrose   What should I tell my healthcare provider before I receive sotrovimab?   Tell your healthcare provider about all of your medical conditions, including if you:   ??? Have any allergies   ??? Have had a serious allergic  reaction to sotrovimab or to any of the ingredients in sotrovimab   ??? Are pregnant or plan to become pregnant   Are breastfeeding or plan to breastfeed   ??? Have any serious illnesses   ??? Are taking any medicines (prescription, over-the-counter, vitamins, or herbal products)      How will I receive sotrovimab?   ??? You will receive 1 dose of sotrovimab.   ??? Sotrovimab will be given to you through a vein (intravenous or IV infusion) over 30 minutes.   ??? You will be observed by your healthcare provider for at least 1 hour after you receive sotrovimab.      What are the important possible side effects of sotrovimab?   Possible side effects of sotrovimab are:   ??? Allergic reactions. Allergic reactions can happen during and after infusion with    sotrovimab. Tell your healthcare provider right away if you get any of the following signs and symptoms of allergic reactions: fever; difficulty breathing; low oxygen level in your blood; chills; tiredness; fast or slow heart rate; chest discomfort or pain; weakness; confusion; nausea; headache; shortness of breath; low or high blood pressure; wheezing; swelling of your lips, face, or throat; rash including hives; itching; muscle aches; dizziness; feeling faint; and sweating.      The side effects of getting any medicine through a vein may include brief pain, bleeding, bruising of the skin, soreness, swelling, and possible infection at the infusion site.   These are not all the possible side effects of sotrovimab. Not many people have been given sotrovimab. Serious and unexpected side effects may happen. Sotrovimab is still being studied, so it is possible that all of the risks are not known at this time.   It is possible that sotrovimab could interfere with your body's own ability to fight off a future infection of SARS-CoV-2. Similarly, sotrovimab may reduce your body's immune response to a vaccine for SARS-CoV-2. Specific studies have not been conducted to address these possible risks. Talk to your healthcare provider if you have any questions.     What other treatment choices are there?       Like sotrovimab, FDA may allow for the emergency use of other medicines to treat people with COVID-19. Go to https://garcia.com/ for information on the emergency use of other medicines that are not approved by FDA to treat people with COVID-19. Your healthcare provider may talk with you about clinical trials for which you may be eligible.   It is your choice to be treated or not to be treated with sotrovimab. Should you decide not to receive sotrovimab, or stop it at any time, it will not change your standard medical care.     What if I am pregnant or breastfeeding?   There is no experience treating pregnant women or breastfeeding mothers with sotrovimab. For a mother and unborn baby, the benefit of receiving sotrovimab may be greater than the risk from the treatment. If you are pregnant or breastfeeding, discuss your options and specific situation with your healthcare provider.     How do I report side effects with sotrovimab?   Tell your healthcare provider right away if you have any side effects that bother you or do not go away.   Report side effects to FDA MedWatch at [employer] or call 1-800-FDA-1088, or call the [employer] COVID Contact Center at 1-866-GSK-COVID [phone]).     How can I learn more?   ??? Ask your healthcare provider   ??? Visit  www.sotrovimabinfo.com   ??? Call the [employer] COVID Contact Center at 1-866-GSK-COVID [phone])

## 2020-08-12 NOTE — Unmapped (Signed)
1245 Pt arrived for Sotrovimab for COVID.     1300 Sotrovimab 500 mg IV infusing over 30 minutes via PIV    1330 Sotrovimab infusion complete.      1430 1 hr monitoring completed.Flushed with NS. PIV removed. Reviewed AVS with pt. Reverbalized understanding.  Dc'd from Infusion Center.

## 2020-08-13 ENCOUNTER — Telehealth: Payer: Self-pay

## 2020-08-13 NOTE — Telephone Encounter (Signed)
Patient let [facility] know that she has COVID and she will call back to rescheduled her procedure. They have canceled the procedure. Updated referral

## 2020-08-16 ENCOUNTER — Ambulatory Visit: Admission: RE | Admit: 2020-08-16 | Payer: Medicare Other | Source: Home / Self Care | Admitting: Gastroenterology

## 2020-08-16 HISTORY — DX: Sleep apnea, unspecified: G47.30

## 2020-08-16 SURGERY — COLONOSCOPY WITH PROPOFOL
Anesthesia: Choice

## 2020-08-16 NOTE — Unmapped (Signed)
Received call from patient stating she is not able to keep fluids down. She states as soon as she tries to drink anything she has nausea and feels like she is going to vomit. Pt states her urine is concentrated and dark in color. Advised pt to go to local ED for evaluation. Pt unsure if she wants to do that. Pt made aware treatment is limited due to her positive COVID-19 test result. Pt states she will go to her local ED.

## 2020-08-27 MED ORDER — PREDNISONE 5 MG TABLET
ORAL_TABLET | 0 refills | 0 days
Start: 2020-08-27 — End: ?

## 2020-08-30 MED ORDER — PREDNISONE 5 MG TABLET
ORAL_TABLET | Freq: Every day | ORAL | 0 refills | 0.00000 days | Status: CP
Start: 2020-08-30 — End: 2021-08-30

## 2020-09-08 NOTE — Unmapped (Signed)
[facility] Clinical Assessment & Refill Coordination Note    Gina Hart, DOB: 05/07/1976  Phone: [phone] (home)     All above HIPAA information was verified with patient.     Was a Nurse, learning disability used for this call? No    Specialty Medication(s):   Transplant: cyclosporine 25mg      Current Outpatient Medications   Medication Sig Dispense Refill   ??? acetaminophen (TYLENOL) 325 MG tablet Take 1-2 tablets (325-650 mg total) by mouth every four (4) hours as needed for pain. 100 tablet 0   ??? amLODIPine (NORVASC) 5 MG tablet TAKE 2 TABLETS BY MOUTH ONCE DAILY AT BEDTIME. HOLD FOR SBP<90 AND/OR MAP<70. 60 tablet 11   ??? aspirin (ECOTRIN) 81 MG tablet TAKE 1 TABLET (81 MG) BY MOUTH DAILY 30 tablet 0   ??? atorvastatin (LIPITOR) 10 MG tablet Take 1 tablet (10 mg total) by mouth daily. 30 tablet 11   ??? biotin 5 mg cap Take 1 capsule (5,000 mcg total) by mouth daily. 30 capsule 11   ??? cyclobenzaprine (FLEXERIL) 10 MG tablet Take 1 tablet (10 mg total) by mouth two (2) times a day as needed for muscle spasms. (Patient not taking: Reported on 10/02/2017) 45 tablet 0   ??? cycloSPORINE modified (NEORAL) 25 MG capsule Take 5 capsules (125 mg total) by mouth two (2) times a day. 300 capsule 11   ??? famotidine (PEPCID) 20 MG tablet Take 1 tablet (20 mg total) by mouth daily as needed for heartburn. 30 tablet 6   ??? fluticasone propionate (FLONASE) 50 mcg/actuation nasal spray 1 spray by Each Nare route daily. 16 g 4   ??? gabapentin (NEURONTIN) 100 MG capsule Take 2 capsules (200 mg total) by mouth Three (3) times a day. (Patient not taking: Reported on 01/02/2018) 180 capsule 11   ??? loratadine (CLARITIN) 10 mg tablet Take 10 mg by mouth daily as needed for allergies.     ??? metoprolol tartrate (LOPRESSOR) 100 MG tablet Take 1 tablet (100 mg total) by mouth Two (2) times a day. 180 tablet 3   ??? MYFORTIC 180 mg EC tablet HOLD (Patient not taking: Reported on 07/09/2019) 60 tablet 11   ??? predniSONE (DELTASONE) 5 MG tablet Take 1 tablet (5 mg total) by mouth daily. 30 tablet 11   ??? rOPINIRole (REQUIP) 0.25 MG tablet Take 1 tablet (0.25 mg total) by mouth nightly. Take 1-3 hours before bedtime. 30 tablet 11   ??? sertraline (ZOLOFT) 100 MG tablet Take 1 tablet (100 mg total) by mouth daily. 90 tablet 3   ??? traMADol (ULTRAM) 50 mg tablet Take 50 mg by mouth every six (6) hours as needed for pain.     ??? traZODone (DESYREL) 50 MG tablet Take 1 tablet (50 mg total) by mouth nightly. 90 tablet 3     No current facility-administered medications for this visit.        Changes to medications: Gina Hart reports no changes at this time.    Allergies   Allergen Reactions   ??? Ibuprofen Swelling     Other reaction(s): SWELLING/EDEMA       Changes to allergies: No    SPECIALTY MEDICATION ADHERENCE     Cyclosporine 25 mg: 9 days of medicine on hand       Medication Adherence    Patient reported X missed doses in the last month: 0  Specialty Medication: Cylosporine 25mg   Patient is on additional specialty medications: No  Specialty medication(s) dose(s) confirmed: Regimen is correct and unchanged.     Are there any concerns with adherence? No    Adherence counseling provided? Not needed    CLINICAL MANAGEMENT AND INTERVENTION      Clinical Benefit Assessment:    Do you feel the medicine is effective or helping your condition? Yes    Clinical Benefit counseling provided? Not needed    Adverse Effects Assessment:    Are you experiencing any side effects? No    Are you experiencing difficulty administering your medicine? No    Quality of Life Assessment:    How many days over the past month did your kidney transplant  keep you from your normal activities? For example, brushing your teeth or getting up in the morning. 0    Have you discussed this with your provider? Not needed    Therapy Appropriateness:    Is therapy appropriate? Yes, therapy is appropriate and should be continued    DISEASE/MEDICATION-SPECIFIC INFORMATION      N/A    PATIENT SPECIFIC NEEDS     - Does the patient have any physical, cognitive, or cultural barriers? No    - Is the patient high risk? Yes, patient is taking a REMS drug. Medication is dispensed in compliance with REMS program    - Does the patient require a Care Management Plan? No     - Does the patient require physician intervention or other additional services (i.e. nutrition, smoking cessation, social work)? No      SHIPPING     Specialty Medication(s) to be Shipped:   Transplant: cyclosporine 25mg     Other medication(s) to be shipped: No additional medications requested for fill at this time     Changes to insurance: No    Delivery Scheduled: Yes, Expected medication delivery date: 09/14/20.     Medication will be delivered via UPS to the confirmed prescription address in [facility].    The patient will receive a drug information handout for each medication shipped and additional FDA Medication Guides as required.  Verified that patient has previously received a Conservation officer, historic buildings.    All of the patient's questions and concerns have been addressed.    Tera Helper   [facility] Specialty Pharmacist

## 2020-09-13 MED FILL — CYCLOSPORINE MODIFIED 25 MG CAPSULE: ORAL | 30 days supply | Qty: 300 | Fill #1

## 2020-09-17 LAB — CYCLOSPORINE LEVEL: CYCLOSPORINE (FPIA) BLOOD: 199 ng/mL (ref 100–400)

## 2020-09-21 LAB — EBV QUANTITATIVE PCR, BLOOD: EPSTEIN-BARR DNA QUANT, PCR: POSITIVE

## 2020-09-21 NOTE — Unmapped (Signed)
Pt called to follow up on Neurology appointment, pt asking when appointment was scheduled. Last known appointment by this provider was 07/29/2020. Pt states she received call from [facility], reminded her she declined to schedule with them because the available appointments were too far out. Pt also states she received call from [facility] and asked why they were calling. Patient made aware that this writer does not keep up with her appointments and she should call them back to find out why they were calling.     Pt also states her labs have not resulted from last week in Epic. Called and requested results from 3/22 and 2/10 from [facility].

## 2020-09-29 MED ORDER — ATORVASTATIN 10 MG TABLET
ORAL_TABLET | 11 refills | 0 days | Status: CP
Start: 2020-09-29 — End: ?

## 2020-10-11 MED FILL — CYCLOSPORINE MODIFIED 25 MG CAPSULE: ORAL | 30 days supply | Qty: 300 | Fill #2

## 2020-10-11 NOTE — Unmapped (Signed)
[facility] Refill Coordination Note    Specialty Medication(s) to be Shipped:   Transplant: cyclosporine 25mg     Other medication(s) to be shipped: No additional medications requested for fill at this time     Gina Hart, DOB: 11/01/1975  Phone: [phone] (home)       All above HIPAA information was verified with patient.     Was a Nurse, learning disability used for this call? No    Completed refill call assessment today to schedule patient's medication shipment from the [facility] [phone]).  All relevant notes have been reviewed.     Specialty medication(s) and dose(s) confirmed: Regimen is correct and unchanged.   Changes to medications: Gina Hart reports no changes at this time.  Changes to insurance: No  New side effects reported not previously addressed with a pharmacist or physician: None reported  Questions for the pharmacist: No    Confirmed patient received a Conservation officer, historic buildings and a Surveyor, mining with first shipment. The patient will receive a drug information handout for each medication shipped and additional FDA Medication Guides as required.       DISEASE/MEDICATION-SPECIFIC INFORMATION        N/A    SPECIALTY MEDICATION ADHERENCE     Medication Adherence    Patient reported X missed doses in the last month: 0  Specialty Medication: Cyclosporine 25mg   Patient is on additional specialty medications: No  Informant: patient              Were doses missed due to medication being on hold? No    Cyclopsorine 25 mg: 4 days of medicine on hand       REFERRAL TO PHARMACIST     Referral to the pharmacist: Not needed      Gina Hart     Shipping address confirmed in Epic.     Delivery Scheduled: Yes, Expected medication delivery date: 10/12/20.     Medication will be delivered via UPS to the prescription address in Epic [facility].    Gina Hart   [facility] Specialty Technician

## 2020-10-18 DIAGNOSIS — Z79899 Other long term (current) drug therapy: Principal | ICD-10-CM

## 2020-10-18 DIAGNOSIS — Z940 Kidney transplant status: Principal | ICD-10-CM

## 2020-10-20 ENCOUNTER — Encounter: Admit: 2020-10-20 | Discharge: 2020-10-21 | Payer: MEDICARE | Attending: Nephrology | Primary: Nephrology

## 2020-10-20 MED ORDER — ATORVASTATIN 20 MG TABLET
ORAL_TABLET | Freq: Every day | ORAL | 3 refills | 90.00000 days | Status: CP
Start: 2020-10-20 — End: 2021-10-20

## 2020-10-20 MED ORDER — GABAPENTIN 100 MG CAPSULE
ORAL_CAPSULE | Freq: Three times a day (TID) | ORAL | 11 refills | 30.00000 days | Status: CP
Start: 2020-10-20 — End: 2021-10-20

## 2020-10-20 NOTE — Unmapped (Signed)
Transplant Nephrology Clinic Visit    ??  Assessment and Plan  Gina Hart is a 45 y.o. female who is s/p deceased donor kidney transplant on 2017/02/02. She is evaluated today by video visit. Active medical issues include:    COVID-19, onset 08/07/20, diagnosed 08/11/20   - Had received 2 doses of COVID vaccine prior to illness  - Symptoms included fatigue, reduced appetite, loss of sense of smell, fever, cough, shortness of breath  - No O2 requirement  - Sotrovimab administered 08/12/20   - She has improved though has persistent dyspnea on exertion, fatigue, and brain fog    Status post deceased donor kidney transplant 02-02-2017 with no history of rejection or evidence of FSGS recurrence.   - Serum creatinine is stable at 0.90 on 09/14/20 (baseline <1.0 mg/dL).   - UP/C has been historically normal (0.20 on 09/14/20).   - DSA screen was negative on 06/10/19 though class II could not be checked due to an interfering substance.    Immunosuppression management.   - Cyclosporine level 199 on 09/14/20 (target trough 125-200)  - Will continue cyclosporine 125 mg BID and prednisone 5 mg daily.   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   - Viral load improved after Myfortic was held  - PET CT 07/09/19 revealed increased tonsillar uptake   - Will continue to follow periodic EBV viral loads.     Hypertension.    - Home BP 120's/60s  - Edema persists on amlodipine   - Continue metoprolol tartrate 100mg  bid and amlodipine 10 mg daily.      Back pain, neck pain, left leg pain with history of spinal fusion and cervical spinal stenosis   - She is s/p spinal fusion of thoracic spine through L2-3  - Recent onset left leg pain is particularly debilitating   - She is scheduled to see a Neurologist May 8th.   - She needs spine clinic appointment  - Will continue gabapentin    Hyperlipidemia management  - Will increase atorvastatin to 20 mg daily from 10 mg daily.  She is aware of potential side effects    Immunizations/Infection Prevention  Will defer pneumococcal vaccines until next in-person visit. COVID-19 vaccination x 2 completed. Will plan 3rd dose after 11/09/20.        Memory Loss/Confusion.  - Brain MRI 07/06/18 anda again 06/15/20 was normal.   - Symptoms initially improved after switching to cyclosporine from tacrolimus, but appear now to be persistent.  - Symptoms worsened after recent COVID infection  - Sleep study 12/17/19 revealed no sleep disordered breathing with mild to moderate periodic limb movement. She is on Requip.     Cholesterol Plaque on Ophthalmology Exam  - Patient reports this finding was made at a recent ophtho appointment.   - Will await Neurology clinic assessment 10/31/20. She may need carotid dopplers or other imaging of cerebrovascular system.   - Continue aspirin and statin    Follow up   3 months with in person visit.     History of Present Illness    Gina Hart is a 45 y.o. female who is s/p deceased kidney transplant on 02/02/2017. She has experienced no episodes of rejection with normal urinary protein and baseline creatinine <1.0 mg/dL.     She was first diagnosed with EBV viremia 06/10/19 (  Viral load 9010) with symptoms of nasal congestion, and loss of taste and smell. PET CT 06/25/19 revealed symmetric hypermetabolic activity in the lingual tonsils without evidence of lymphadenopathy elsewhere. ENT evaluation on 07/09/19 revealed enlarged tonsils, enlarged turbinates, and congested nasal mucosa. Repeat PET CT at [facility] on 07/11/19 revealed only slightly increased uptake in the lingual tonsils without evidence of malignancy. EBV viral load has been gradually declining with most recent value <35 on 08/05/20. She denies tonsillar enlargement or sore throat.       She was diagnosed with COVID-19 on 08/11/20 with symptom onset 08/07/20. Symptoms included  fatigue, reduced appetite, loss of sense of smell, fever, cough, shortness of breath, and worsening cognition. She has persistent fatigue and dyspnea on exertion. Her memory deficits present prior to COVID have worsened and persisted. She also complains of weight gain and increased peripheral edema. She remains on amlodipine 10 mg daily and metoprolol for BP. Home BP has been 120's/60's. She denies dysuria or allograft tenderness.  She recently saw an ophthalmologist who she states saw evidence of a cholesterol plaque on retinal exam.     Immunosuppressive medication doses are unchanged (cyclosporine 125 mg bid, prednisone 5 mg daily). She continues to hold Myfortic due to the EBV viremia history.     Transplant History:  1. 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  2. S/P kidney transplant at [facility] 01-31-2107. Deceased donor with KDPI 19%. CMV D+/R-; EBV D+/R+  3. Immunosuppression = campath induction followed by tacrolimus and myfortic maintenance therapy, steroid free regimen.   4. Tacrolimus changed to cyclosporine 07/31/2017 due to neurotoxicity with memory deficits and headaches as primary manifestation.  5. RSV infection 09/10/18  6. EBV viremia 05/2020 (VL 9010) with increased tonsillar uptake on PET CT scans 06/24/20 and 07/11/19. No evidence of PTLD.   7. COVID-19 infection 07/2020    Past Medical History:  1. ESRD secondary to FSGS.   2. Hypertension  3. Scoliosis with history of spinal fusion thoracic spine to L2-3  4. COVID-19 07/2020  5. S/P kidney transplant as stated above    Review of Systems    All other systems are reviewed and are negative.    Medications  Current Outpatient Medications   Medication Sig Dispense Refill   ??? acetaminophen (TYLENOL) 325 MG tablet Take 1-2 tablets (325-650 mg total) by mouth every four (4) hours as needed for pain. 100 tablet 0   ??? amLODIPine (NORVASC) 5 MG tablet TAKE 2 TABLETS BY MOUTH ONCE DAILY AT BEDTIME. HOLD FOR SBP<90 AND/OR MAP<70. 60 tablet 11   ??? aspirin (ECOTRIN) 81 MG tablet TAKE 1 TABLET (81 MG) BY MOUTH DAILY 30 tablet 0   ??? atorvastatin (LIPITOR) 10 MG tablet TAKE 1 TABLET BY MOUTH ONCE DAILY FOR CHOLESTEROL 30 tablet 11   ??? biotin 5 mg cap Take 1 capsule (5,000 mcg total) by mouth daily. 30 capsule 11   ??? cyclobenzaprine (FLEXERIL) 10 MG tablet Take 1 tablet (10 mg total) by mouth two (2) times a day as needed for muscle spasms. (Patient not taking: Reported on 10/02/2017) 45 tablet 0   ??? cycloSPORINE modified (NEORAL) 25 MG capsule Take 5 capsules (125 mg total) by mouth two (2) times a day. 300 capsule 11   ??? famotidine (PEPCID) 20 MG tablet Take 1 tablet (20 mg total) by mouth daily as needed for heartburn. 30 tablet 6   ??? fluticasone propionate (FLONASE) 50 mcg/actuation nasal spray 1 spray by Each  Nare route daily. 16 g 4   ??? gabapentin (NEURONTIN) 100 MG capsule Take 2 capsules (200 mg total) by mouth Three (3) times a day. (Patient not taking: Reported on 01/02/2018) 180 capsule 11   ??? loratadine (CLARITIN) 10 mg tablet Take 10 mg by mouth daily as needed for allergies.     ??? metoprolol tartrate (LOPRESSOR) 100 MG tablet Take 1 tablet (100 mg total) by mouth Two (2) times a day. 180 tablet 3   ??? MYFORTIC 180 mg EC tablet HOLD (Patient not taking: Reported on 07/09/2019) 60 tablet 11   ??? predniSONE (DELTASONE) 5 MG tablet Take 1 tablet (5 mg total) by mouth daily. 30 tablet 11   ??? rOPINIRole (REQUIP) 0.25 MG tablet Take 1 tablet (0.25 mg total) by mouth nightly. Take 1-3 hours before bedtime. 30 tablet 11   ??? sertraline (ZOLOFT) 100 MG tablet Take 1 tablet (100 mg total) by mouth daily. 90 tablet 3   ??? traMADol (ULTRAM) 50 mg tablet Take 50 mg by mouth every six (6) hours as needed for pain.     ??? traZODone (DESYREL) 50 MG tablet Take 1 tablet (50 mg total) by mouth nightly. 90 tablet 3     No current facility-administered medications for this visit.       Physical Exam    Patient appears in no distress on video visit.     Laboratory Results and Imaging Studies Reviewed in EMR      Video Visit:        The patient reports they are currently: at home. I spent 22 minutes on the real-time audio and video with the patient on the date of service. I spent an additional 25 minutes on pre- and post-visit activities on the date of service. I was present in the [facility] during this visit.     The patient was physically located in [place] or a state in which I am permitted to provide care. The patient and/or parent/guardian understood that s/he may incur co-pays and cost sharing, and agreed to the telemedicine visit. The visit was reasonable and appropriate under the circumstances given the patient's presentation at the time.    The patient and/or parent/guardian has been advised of the potential risks and limitations of this mode of treatment (including, but not limited to, the absence of in-person examination) and has agreed to be treated using telemedicine. The patient's/patient's family's questions regarding telemedicine have been answered.     If the visit was completed in an ambulatory setting, the patient and/or parent/guardian has also been advised to contact their provider???s office for worsening conditions, and seek emergency medical treatment and/or call 911 if the patient deems either necessary.      I spent 24 minutes on the audio/video with the patient. I spent an additional 15 minutes on pre- and post-visit activities. I was present in the [facility] clinic during this visit. I was present in the [facility] clinic during this visit.     The patient was physically located in [place] or a state in which I am permitted to provide care. The patient and/or parent/gauardian understood that s/he may incur co-pays and cost sharing, and agreed to the telemedicine visit. The visit was completed via phone and/or video, which was appropriate and reasonable under the circumstances given the patient's presentation at the time.    The patient and/or parent/guardian has been advised of the potential risks and limitations of this mode of treatment (including,  but not limited to, the absence of in-person examination) and has agreed to be treated using telemedicine. The patient's/patient's family's questions regarding telemedicine have been answered.     If the phone/video visit was completed in an ambulatory setting, the patient and/or parent/guardian has also been advised to contact their provider???s office for worsening conditions, and seek emergency medical treatment and/or call 911 if the patient deems either necessary.

## 2020-11-08 NOTE — Unmapped (Signed)
[facility] Refill Coordination Note    Specialty Medication(s) to be Shipped:   Transplant: cyclosporine 25mg     Other medication(s) to be shipped: No additional medications requested for fill at this time     Gina Hart, DOB: 1975/07/27  Phone: [phone] (home)       All above HIPAA information was verified with patient.     Was a Nurse, learning disability used for this call? No    Completed refill call assessment today to schedule patient's medication shipment from the [facility] [phone]).  All relevant notes have been reviewed.     Specialty medication(s) and dose(s) confirmed: Regimen is correct and unchanged.   Changes to medications: Gina Hart reports no changes at this time.  Changes to insurance: No  New side effects reported not previously addressed with a pharmacist or physician: None reported  Questions for the pharmacist: No    Confirmed patient received a Conservation officer, historic buildings and a Surveyor, mining with first shipment. The patient will receive a drug information handout for each medication shipped and additional FDA Medication Guides as required.       DISEASE/MEDICATION-SPECIFIC INFORMATION        N/A    SPECIALTY MEDICATION ADHERENCE     Medication Adherence    Patient reported X missed doses in the last month: 0  Specialty Medication: Cyclosporine 25mg   Patient is on additional specialty medications: No              Were doses missed due to medication being on hold? No    Cyclosporine 25 mg: 7 days of medicine on hand       REFERRAL TO PHARMACIST     Referral to the pharmacist: Not needed      [facility]     Shipping address confirmed in Epic.     Delivery Scheduled: Yes, Expected medication delivery date: 11/11/20.     Medication will be delivered via UPS to the prescription address in Epic WAM.    Tera Helper   [facility] Specialty Pharmacist

## 2020-11-10 MED FILL — CYCLOSPORINE MODIFIED 25 MG CAPSULE: ORAL | 30 days supply | Qty: 300 | Fill #3

## 2020-11-11 DIAGNOSIS — Z940 Kidney transplant status: Principal | ICD-10-CM

## 2020-11-11 DIAGNOSIS — D849 Immunodeficiency, unspecified: Principal | ICD-10-CM

## 2020-11-11 LAB — CBC W/ DIFFERENTIAL
BASOPHILS ABSOLUTE COUNT: 0 — ABNORMAL LOW (ref 0.01–0.09)
BASOPHILS RELATIVE PERCENT: 0.6 % (ref 0.0–2.0)
EOSINOPHILS ABSOLUTE COUNT: 0.1 (ref 0.04–0.5)
EOSINOPHILS RELATIVE PERCENT: 2.7 % (ref 1.0–8.0)
HEMATOCRIT: 39.9 (ref 35–48)
HEMOGLOBIN: 13.4
LYMPHOCYTES ABSOLUTE COUNT: 0.9 — ABNORMAL LOW (ref 1.0–2.7)
LYMPHOCYTES RELATIVE PERCENT: 21.2 % (ref 20–45)
MEAN CORPUSCULAR HEMOGLOBIN CONC: 33.5 (ref 33.3–35.5)
MEAN CORPUSCULAR HEMOGLOBIN: 30.1 pg (ref 25.8–33.0)
MEAN CORPUSCULAR VOLUME: 89.7 fl (ref 78–95)
MEAN PLATELET VOLUME: 8.7 fl (ref 7.5–11.2)
MONOCYTES ABSOLUTE COUNT: 0.7 (ref 0.2–0.8)
MONOCYTES RELATIVE PERCENT: 17.3 % — ABNORMAL HIGH (ref 4.0–12.0)
NEUTROPHILS ABSOLUTE COUNT: 2.4
NEUTROPHILS RELATIVE PERCENT: 58.2 % (ref 42–74)
PLATELET COUNT: 209
RED BLOOD CELL COUNT: 4.4 (ref 4.0–5.3)
RED CELL DISTRIBUTION WIDTH: 12.6 % (ref 12.1–15.0)
WHITE BLOOD CELL COUNT: 4 (ref 4.0–10.5)

## 2020-11-11 LAB — LIPID PANEL
CHOLESTEROL: 168 (ref 50–200)
HDL CHOLESTEROL: 66 — ABNORMAL HIGH (ref 35–60)
LDL CHOLESTEROL CALCULATED: 86 (ref 5.0–130.0)
LDL/HDL RATIO: 2.545
TRIGLYCERIDES: 87 (ref 30–200)

## 2020-11-11 LAB — BASIC METABOLIC PANEL
ANION GAP: 6.9 mmol/L (ref 2.0–15.0)
BLOOD UREA NITROGEN: 22 — ABNORMAL HIGH (ref 7–18)
BUN / CREAT RATIO: 24.4 — ABNORMAL HIGH (ref 10.00–16.00)
CALCIUM: 9.5 (ref 8.5–10.1)
CHLORIDE: 106 mmol/L (ref 100–108)
CO2: 26.1 mmol/L (ref 21–32)
CREATININE: 0.9 (ref 0.6–1.3)
EGFR CKD-EPI AA MALE: 82
GLUCOSE RANDOM: 87 mg/dL (ref 65–99)
MAGNESIUM: 1.8 (ref 1.8–2.4)
PHOSPHORUS: 3.6 (ref 2.6–4.7)
POTASSIUM: 3.9 mmol/L (ref 3.5–5.1)
SODIUM: 139 mmol/L (ref 136–145)

## 2020-11-11 LAB — URINALYSIS WITH CULTURE REFLEX
BILIRUBIN UA: NEGATIVE
BLOOD UA: NEGATIVE
GLUCOSE UA: NEGATIVE
KETONES UA: NEGATIVE
LEUKOCYTE ESTERASE UA: NEGATIVE
NITRITE UA: NEGATIVE
PH UA: 6 (ref 4.5–8)
PROTEIN UA: NEGATIVE
SPECIFIC GRAVITY UA: 1.02 (ref 1.005–1.030)
UROBILINOGEN UA: 0.2

## 2020-11-11 LAB — COMPREHENSIVE METABOLIC PANEL
ALBUMIN: 3.6 (ref 2.5–5.0)
ALKALINE PHOSPHATASE: 115 U/L — ABNORMAL HIGH (ref 50–110)
ALT (SGPT): 41 U/L (ref 30–65)
AST (SGOT): 25 U/L (ref 15–70)
BILIRUBIN TOTAL: 0.5 (ref 0.0–1.0)

## 2020-11-11 LAB — GAMMA GT: GAMMA GLUTAMYL TRANSFERASE: 50 U/L (ref 5–85)

## 2020-11-11 LAB — PROTEIN / CREATININE RATIO, URINE
CREATININE, URINE: 79.2
PROTEIN URINE: 14.9
PROTEIN/CREAT RATIO, URINE: 0.2

## 2020-11-11 MED ORDER — CHOLECALCIFEROL (VITAMIN D3) 250 MCG (10,000 UNIT) CAPSULE
ORAL_CAPSULE | ORAL | 2 refills | 28 days | Status: CP
Start: 2020-11-11 — End: 2020-11-11

## 2020-11-11 MED ORDER — ERGOCALCIFEROL (VITAMIN D2) 1,250 MCG (50,000 UNIT) CAPSULE
ORAL_CAPSULE | ORAL | 0 refills | 84.00000 days | Status: CP
Start: 2020-11-11 — End: 2021-11-11

## 2020-11-11 NOTE — Unmapped (Signed)
Called in script for Vitamin D.

## 2020-11-12 NOTE — Unmapped (Signed)
Called patient.  Unable to leave message due to full voicemail.  Will send message via My Chart that it is ok to take Lexapro.

## 2020-11-17 ENCOUNTER — Ambulatory Visit: Payer: Medicare Other | Attending: Neurology

## 2020-11-17 DIAGNOSIS — Z940 Kidney transplant status: Principal | ICD-10-CM

## 2020-11-17 DIAGNOSIS — D849 Immunodeficiency, unspecified: Principal | ICD-10-CM

## 2020-11-17 MED ORDER — CYCLOBENZAPRINE 10 MG TABLET
ORAL_TABLET | Freq: Two times a day (BID) | ORAL | 0 refills | 23 days | Status: CP | PRN
Start: 2020-11-17 — End: ?

## 2020-11-17 NOTE — Unmapped (Signed)
Called patient back regarding questions.  Informed patient she can begin Lexapro.  Instructed patient to call Neurology and follow up regarding concern over PT she is referred to being too far away.  Patient requesting a prescription for Flexeril related to right leg muscle stiffness she has been experiencing.

## 2020-11-17 NOTE — Unmapped (Signed)
Called patient regarding request for muscle relaxer.  Prescription called into pharmacy and patient notified.

## 2020-11-23 ENCOUNTER — Ambulatory Visit: Payer: Medicare Other

## 2020-11-25 ENCOUNTER — Ambulatory Visit: Payer: Medicaid Other

## 2020-11-25 LAB — EBV QUANTITATIVE PCR, BLOOD: EPSTEIN-BARR DNA QUANT, PCR: POSITIVE — AB

## 2020-12-01 NOTE — Unmapped (Signed)
[facility] Refill Coordination Note    Specialty Medication(s) to be Shipped:   Transplant: cyclosporine 25mg     Other medication(s) to be shipped: No additional medications requested for fill at this time     Gina Hart, DOB: 1976-01-25  Phone: [phone] (home)       All above HIPAA information was verified with patient.     Was a Nurse, learning disability used for this call? No    Completed refill call assessment today to schedule patient's medication shipment from the [facility] [phone]).  All relevant notes have been reviewed.     Specialty medication(s) and dose(s) confirmed: Regimen is correct and unchanged.   Changes to medications: Gina Hart reports no changes at this time.  Changes to insurance: No  New side effects reported not previously addressed with a pharmacist or physician: None reported  Questions for the pharmacist: No    Confirmed patient received a Conservation officer, historic buildings and a Surveyor, mining with first shipment. The patient will receive a drug information handout for each medication shipped and additional FDA Medication Guides as required.       DISEASE/MEDICATION-SPECIFIC INFORMATION        N/A    SPECIALTY MEDICATION ADHERENCE     Medication Adherence    Patient reported X missed doses in the last month: 0  Specialty Medication: Cyclosporine 25mg   Patient is on additional specialty medications: No              Were doses missed due to medication being on hold? No    Cyclosporine 25 mg: 13 days of medicine on hand       REFERRAL TO PHARMACIST     Referral to the pharmacist: Not needed      [facility]     Shipping address confirmed in Epic.     Delivery Scheduled: Yes, Expected medication delivery date: 12/09/20.     Medication will be delivered via UPS to the prescription address in Epic WAM.    Tera Helper   [facility] Specialty Pharmacist

## 2020-12-08 MED FILL — CYCLOSPORINE MODIFIED 25 MG CAPSULE: ORAL | 30 days supply | Qty: 300 | Fill #4

## 2020-12-08 NOTE — Unmapped (Signed)
Spoke with patient regarding discontinued anxiety medication. Patient is unaware of what the medication is, but has held it due to her complaint the med is not making her feel well.  She is questioning if she should restart Zoloft, stop Trazodone due to feeling ineffective and feels like she is taking too many medications in general.  Encouraged patient to talk with PCP about med changes since they are the ones who have initiated dose.  Patient will follow up with PCP in July and attempt to see them sooner.

## 2020-12-16 NOTE — Unmapped (Signed)
Returned patient's call regarding patient's request to have ultrasound at [facility].  No answer and voicemail full.  Unable to leave message.

## 2020-12-21 ENCOUNTER — Encounter (HOSPITAL_COMMUNITY): Payer: Self-pay

## 2020-12-21 ENCOUNTER — Other Ambulatory Visit: Payer: Self-pay

## 2020-12-21 DIAGNOSIS — N186 End stage renal disease: Secondary | ICD-10-CM | POA: Diagnosis not present

## 2020-12-21 DIAGNOSIS — I120 Hypertensive chronic kidney disease with stage 5 chronic kidney disease or end stage renal disease: Secondary | ICD-10-CM | POA: Diagnosis not present

## 2020-12-21 DIAGNOSIS — R42 Dizziness and giddiness: Secondary | ICD-10-CM | POA: Insufficient documentation

## 2020-12-21 DIAGNOSIS — R112 Nausea with vomiting, unspecified: Secondary | ICD-10-CM | POA: Diagnosis not present

## 2020-12-21 DIAGNOSIS — Z79899 Other long term (current) drug therapy: Secondary | ICD-10-CM | POA: Insufficient documentation

## 2020-12-21 DIAGNOSIS — Z7982 Long term (current) use of aspirin: Secondary | ICD-10-CM | POA: Diagnosis not present

## 2020-12-21 DIAGNOSIS — R070 Pain in throat: Secondary | ICD-10-CM | POA: Diagnosis not present

## 2020-12-21 DIAGNOSIS — Z992 Dependence on renal dialysis: Secondary | ICD-10-CM | POA: Insufficient documentation

## 2020-12-21 NOTE — ED Notes (Signed)
[facility] for cc of itchy throat this am. Throughout the day she is now having a spinning feeling when she lays down.

## 2020-12-22 ENCOUNTER — Emergency Department (HOSPITAL_COMMUNITY)
Admission: EM | Admit: 2020-12-22 | Discharge: 2020-12-22 | Disposition: A | Discharge status: Home / Self Care | Payer: Medicare HMO | Attending: Emergency Medicine | Admitting: Emergency Medicine

## 2020-12-22 DIAGNOSIS — R42 Dizziness and giddiness: Secondary | ICD-10-CM

## 2020-12-22 LAB — CBC WITH DIFFERENTIAL/PLATELET
Abs Immature Granulocytes: 0.01 10*3/uL (ref 0.00–0.07)
Basophils Absolute: 0 10*3/uL (ref 0.0–0.1)
Basophils Relative: 0 %
Eosinophils Absolute: 0.1 10*3/uL (ref 0.0–0.5)
Eosinophils Relative: 1 %
HCT: 39.8 % (ref 36.0–46.0)
Hemoglobin: 13.6 g/dL (ref 12.0–15.0)
Immature Granulocytes: 0 %
Lymphocytes Relative: 14 %
Lymphs Abs: 0.8 10*3/uL (ref 0.7–4.0)
MCH: 30.8 pg (ref 26.0–34.0)
MCHC: 34.2 g/dL (ref 30.0–36.0)
MCV: 90.2 fL (ref 80.0–100.0)
Monocytes Absolute: 0.6 10*3/uL (ref 0.1–1.0)
Monocytes Relative: 11 %
Neutro Abs: 4.1 10*3/uL (ref 1.7–7.7)
Neutrophils Relative %: 74 %
Platelets: 236 10*3/uL (ref 150–400)
RBC: 4.41 MIL/uL (ref 3.87–5.11)
RDW: 12 % (ref 11.5–15.5)
WBC: 5.7 10*3/uL (ref 4.0–10.5)
nRBC: 0 % (ref 0.0–0.2)

## 2020-12-22 LAB — BASIC METABOLIC PANEL
Anion gap: 6 (ref 5–15)
BUN: 25 mg/dL — ABNORMAL HIGH (ref 6–20)
CO2: 24 mmol/L (ref 22–32)
Calcium: 10 mg/dL (ref 8.9–10.3)
Chloride: 103 mmol/L (ref 98–111)
Creatinine, Ser: 0.9 mg/dL (ref 0.44–1.00)
GFR, Estimated: >60 mL/min (ref >60)
Glucose, Bld: 113 mg/dL — ABNORMAL HIGH (ref 70–99)
Potassium: 3.9 mmol/L (ref 3.5–5.1)
Sodium: 133 mmol/L — ABNORMAL LOW (ref 135–145)

## 2020-12-22 MED ORDER — SODIUM CHLORIDE 0.9 % IV BOLUS
1000.0000 mL | Freq: Once | INTRAVENOUS | Status: AC
  Administered 2020-12-22: 1000 mL via INTRAVENOUS

## 2020-12-22 MED ORDER — MECLIZINE HCL 25 MG PO TABS: 25.0000 mg | ORAL_TABLET | Freq: Three times a day (TID) | ORAL | 0 refills | Status: AC | PRN

## 2020-12-22 MED ORDER — MECLIZINE HCL 12.5 MG PO TABS
25.0000 mg | ORAL_TABLET | Freq: Once | ORAL | Status: AC
  Administered 2020-12-22: 25 mg via ORAL
  Filled 2020-12-22: qty 2

## 2020-12-22 MED ORDER — ONDANSETRON HCL 4 MG/2ML IJ SOLN
4.0000 mg | Freq: Once | INTRAMUSCULAR | Status: AC
  Administered 2020-12-22: 4 mg via INTRAVENOUS
  Filled 2020-12-22: qty 2

## 2020-12-22 NOTE — Discharge Instructions (Addendum)
Begin taking meclizine as prescribed as needed for dizziness.  Follow-up with your primary doctor if symptoms are not improving in the next 3 to 5 days.

## 2020-12-22 NOTE — ED Provider Notes (Signed)
[facility] EMERGENCY DEPARTMENT Provider Note   CSN: [id number] Arrival date & time: 12/21/20  2226     History Chief Complaint  Patient presents with   Dizziness    Vicki Mercer is a 45 y.o. female.  Patient is a 45 year old female with past medical history of renal transplant, hypertension, anemia, pulmonary hypertension.  Patient presenting today for evaluation of dizziness.  He woke up this morning with a scratchy throat.  EMS was called to the house, but she was not transported.  After EMS left, she began having episodes of dizziness.  She describes this as a "room spinning".  This began and recurs when she lies flat.  This is associated with nausea and vomiting symptoms are improved with sitting up.  She denies any headache, weakness, or numbness.  She denies any hearing loss or ringing in her ears.  The history is provided by the patient.  Dizziness Quality:  Room spinning Severity:  Moderate Onset quality:  Sudden Duration:  8 hours Timing:  Intermittent Progression:  Unchanged Chronicity:  New Relieved by:  Being still Worsened by:  Lying down Ineffective treatments:  None tried     Past Medical History:  Diagnosis Date   Anemia    Hypertension    Renal disorder    kidney transplant (right) 01/11/2017   Sleep apnea    per pt, not severe enough for  CPAP    Patient Active Problem List   Diagnosis Date Noted   Morbid (severe) obesity due to excess calories ([place]) 07/08/2020   Viral upper respiratory tract infection 09/10/2018   Other long term (current) drug therapy 07/23/2017   Other specified health status 07/23/2017   Pain in unspecified joint 07/23/2017   Disorder of bone, unspecified 07/23/2017   Chronic pain syndrome 07/23/2017   Chronic bilateral low back pain with bilateral sciatica (Primary Area of Pain) (Bilateral) (L>R) 07/23/2017   Chronic pain of lower extremity, bilateral (Secondary Area of Pain) (Bilateral) (L>R) 07/23/2017   Chronic midline  posterior neck pain (Tertiary Area of Pain) 07/23/2017   Low back pain of over 3 months duration 06/04/2017   Radicular pain of right lower extremity 06/04/2017   Aftercare following organ transplant 05/29/2017   Immunosuppressed status ([place]) 05/29/2017   Kidney replaced by transplant 01/11/2017   Coag negative Staphylococcus bacteremia 03/18/2016   Pulmonary hypertension ([place]) 01/27/2015   Peritoneal dialysis status ([place]) 09/10/2014   Visit for pre-operative examination 09/10/2014   Mixed anxiety and depressive disorder 09/02/2014   ESRD on dialysis ([place]) 11/11/2013   Anemia 07/03/2013   Displacement of lumbar intervertebral disc 06/13/2011   Postlaminectomy syndrome, thoracic 05/29/2011   Essential hypertension 10/23/1995   Focal segmental glomerulosclerosis 10/23/1995    Past Surgical History:  Procedure Laterality Date   AV FISTULA PLACEMENT Right 2014   forearm   KIDNEY TRANSPLANT Right 01/11/2017   at [place]; original kidneys remain   SPINE SURGERY  1991   spinal fusion at [place]     OB History   No obstetric history on file.     Family History  Problem Relation Age of Onset   High blood pressure Mother     Social History   Tobacco Use   Smoking status: Never   Smokeless tobacco: Never  Vaping Use   Vaping Use: Never used  Substance Use Topics   Alcohol use: No   Drug use: No    Home Medications Prior to Admission medications   Medication Sig Start Date  End Date Taking? Authorizing Provider  acetaminophen (TYLENOL) 500 MG tablet Take 500 mg by mouth every 6 (six) hours as needed.    [provider]  amLODipine (NORVASC) 5 MG tablet Take 10 mg by mouth daily.    [provider]  aspirin EC 81 MG tablet Take 81 mg by mouth daily.    [provider]  atorvastatin (LIPITOR) 10 MG tablet Take 10 mg by mouth daily. Patient not taking: Reported on 07/28/2020 06/09/20   [provider]  BIOTIN MAXIMUM STRENGTH  10000 MCG TABS Take by mouth. 06/04/20   [provider]  cycloSPORINE (SANDIMMUNE) 100 MG capsule Take 125 mg by mouth 2 (two) times daily.    [provider]  loratadine (CLARITIN) 10 MG tablet Take 10 mg by mouth daily as needed for allergies.    [provider]  metoprolol tartrate (LOPRESSOR) 100 MG tablet Take 100 mg by mouth 2 (two) times daily.    [provider]  predniSONE (DELTASONE) 5 MG tablet Take 5 mg by mouth daily. 06/17/20   [provider]  rOPINIRole (REQUIP) 0.25 MG tablet Take 0.25 mg by mouth at bedtime. 06/25/20   [provider]  sertraline (ZOLOFT) 100 MG tablet Take 100 mg by mouth daily. Patient not taking: No sig reported    [provider]  traZODone (DESYREL) 50 MG tablet Take 50 mg by mouth at bedtime.    [provider]    Allergies    Ibuprofen and Sulfa antibiotics  Review of Systems   Review of Systems  Neurological:  Positive for dizziness.  All other systems reviewed and are negative.  Physical Exam Updated Vital Signs BP (!) 124/92   Pulse 72   Temp 98 F (36.7 C) (Oral)   Resp 18   Ht 4\' 11"  (1.499 m)   Wt 98.9 kg   LMP 07/28/2016 (Approximate)   SpO2 99%   BMI 44.03 kg/m   Physical Exam Vitals and nursing note reviewed.  Constitutional:      General: She is not in acute distress.    Appearance: She is well-developed. She is not diaphoretic.  HENT:     Head: Normocephalic and atraumatic.     Right Ear: Tympanic membrane normal.     Left Ear: Tympanic membrane normal.  Eyes:     Extraocular Movements: Extraocular movements intact.     Pupils: Pupils are equal, round, and reactive to light.  Cardiovascular:     Rate and Rhythm: Normal rate and regular rhythm.     Heart sounds: No murmur heard.   No friction rub. No gallop.  Pulmonary:     Effort: Pulmonary effort is normal. No respiratory distress.     Breath sounds: Normal breath sounds. No wheezing.   Abdominal:     General: Bowel sounds are normal. There is no distension.     Palpations: Abdomen is soft.     Tenderness: There is no abdominal tenderness.  Musculoskeletal:        General: Normal range of motion.     Cervical back: Normal range of motion and neck supple.  Skin:    General: Skin is warm and dry.  Neurological:     General: No focal deficit present.     Mental Status: She is alert and oriented to person, place, and time. Mental status is at baseline.     Cranial Nerves: No cranial nerve deficit.     Sensory: No sensory deficit.  Motor: No weakness.     Coordination: Coordination normal.    ED Results / Procedures / Treatments   Labs (all labs ordered are listed, but only abnormal results are displayed) Labs Reviewed  BASIC METABOLIC PANEL  CBC WITH DIFFERENTIAL/PLATELET    EKG None  Radiology No results found.  Procedures Procedures   Medications Ordered in ED Medications  ondansetron (ZOFRAN) injection 4 mg (has no administration in time range)  sodium chloride 0.9 % bolus 1,000 mL (has no administration in time range)  meclizine (ANTIVERT) tablet 25 mg (has no administration in time range)    ED Course  I have reviewed the triage vital signs and the nursing notes.  Pertinent labs & imaging results that were available during my care of the patient were reviewed by me and considered in my medical decision making (see chart for details).    MDM Rules/Calculators/A&P  Patient is a 45 year old female presenting with dizziness as described in the HPI.  Her symptoms are most consistent with peripheral vertigo.  Her symptoms are worse when she lies flat and are described as a spinning sensation.  She had nausea and vomiting with 1 of these episodes.  Patient's laboratory studies are reassuring.  Her creatinine is 0.9 and she is status post renal transplant.  At this point, I feel as though discharge is appropriate.  She will be prescribed meclizine  which she can take if symptoms persist.  Final Clinical Impression(s) / ED Diagnoses Final diagnoses:  None    Rx / DC Orders ED Discharge Orders     None        Veryl Speak, MD 12/22/20 0401

## 2020-12-23 NOTE — Unmapped (Addendum)
Pt paged on call concerned about her BP. She reports her amlodipine changed from 2 at night to just one at night a few weeks ago. She reports her diastolic has been in the 80s-90s recently and her systolic 134 or 154 can't remember. She went to the ER yesterday and diagnosed her with vertigo and slight dehydration.  Notes in care everywhere.   I asked patient if she can monitor her BP and report back. Over the last couple of days her readings have been 121/81, 126/77, 156/96, and most recently (30 mins ago) 139/91.  Pt wanted to know if she should take 2 amlodipine at night.    She is feeling ok just dizziness. Pt reports increasing her fluids and eating pedialyte pops. Pt will call me back after she takes her BP tonight.     December 23, 2020 7:34 PM  Pt paged on call as instructed to report BP130/81, pulse 74. Plan for her to take one tablet of amlodipine tonight as ordered and continue to monitor her BP. Pt agreed. She will call with any questions or concerns.    Routed message to primary team.

## 2021-01-07 NOTE — Unmapped (Signed)
[facility] Refill Coordination Note    Specialty Medication(s) to be Shipped:   Transplant: cyclosporine 25mg     Other medication(s) to be shipped: No additional medications requested for fill at this time     Likisha Hart, DOB: 05-26-1976  Phone: [phone] (home)       All above HIPAA information was verified with patient.     Was a Nurse, learning disability used for this call? No    Completed refill call assessment today to schedule patient's medication shipment from the [facility] [phone]).  All relevant notes have been reviewed.     Specialty medication(s) and dose(s) confirmed: Regimen is correct and unchanged.   Changes to medications: Gina Hart reports no changes at this time.  Changes to insurance: No  New side effects reported not previously addressed with a pharmacist or physician: Gina Hart reported  Questions for the pharmacist: No    Confirmed patient received a Conservation officer, historic buildings and a Surveyor, mining with first shipment. The patient will receive a drug information handout for each medication shipped and additional FDA Medication Guides as required.       DISEASE/MEDICATION-SPECIFIC INFORMATION        N/A    SPECIALTY MEDICATION ADHERENCE     Medication Adherence    Patient reported X missed doses in the last month: 0  Specialty Medication: Cyclosporine  Patient is on additional specialty medications: No  Patient is on more than two specialty medications: No  Any gaps in refill history greater than 2 weeks in the last 3 months: no  Demonstrates understanding of importance of adherence: yes  Informant: patient              Were doses missed due to medication being on hold? No    Cyclosporine 25mg : Patient has 10 days of medication on hand    REFERRAL TO PHARMACIST     Referral to the pharmacist: Not needed      [facility]     Shipping address confirmed in Epic.     Delivery Scheduled: Yes, Expected medication delivery date: 7/19.     Medication will be delivered via UPS to the prescription address in Epic WAM.    Gina Hart   [facility] Specialty Technician

## 2021-01-10 MED FILL — CYCLOSPORINE MODIFIED 25 MG CAPSULE: ORAL | 30 days supply | Qty: 300 | Fill #5

## 2021-01-10 NOTE — Unmapped (Signed)
Returned patient's call.  Patient concerned because her lab results are still not in [place].  Message sent to TPA's.  Patient made aware.  Verbalized understanding.

## 2021-01-12 DIAGNOSIS — Z940 Kidney transplant status: Principal | ICD-10-CM

## 2021-01-12 DIAGNOSIS — D849 Immunodeficiency, unspecified: Principal | ICD-10-CM

## 2021-01-12 MED ORDER — PRAVASTATIN 20 MG TABLET
ORAL_TABLET | Freq: Every evening | ORAL | 11 refills | 30.00000 days | Status: CP
Start: 2021-01-12 — End: 2022-01-12
  Filled 2021-01-14: qty 30, 30d supply, fill #0

## 2021-01-12 MED ORDER — ATORVASTATIN 20 MG TABLET
ORAL_TABLET | Freq: Every day | ORAL | 3 refills | 90 days | Status: CP
Start: 2021-01-12 — End: 2021-01-12

## 2021-01-12 NOTE — Unmapped (Signed)
Returned patient's call.  Reviewed lab results and discussed medication questions regarding Atorvastatin and Cyclosporine.  Patient verbalized understanding.  Wishes to have Vitamin D and EBV levels checked.  Orders placed to have labs drawn at [facility] on Friday with Xray and ultrasound.

## 2021-01-12 NOTE — Unmapped (Signed)
Per Dr Christena Deem, patient's Atorvastatin discontinued and Prevastatin started instead.  Order sent to [facility] shared services phamacy.  Patient called and verbalized understanding.

## 2021-01-12 NOTE — Unmapped (Signed)
Patient paged on-call coordinator because her pharmacy told her her insurance denied coverage of atorvastatin due to it's interaction with cyclosporine.  She paid out of pocket ($8) for it today. The pharmacist told her to call [facility] to let them know and ensure she should be taking both.     Relayed information to primary coordinator.     Patient was also asking about her labs from 7/13 and why they were not in her mychart. Will ask coordinator to follow-up with this as well. Pt mentioned she should have had an EBV drawn as well but this coord did not see it resulted from 7/13.

## 2021-01-12 NOTE — Unmapped (Signed)
I left a message with the lab regarding the EBV. As far as not seeing her labs in mychart I have no idea about that.

## 2021-01-13 DIAGNOSIS — D849 Immunodeficiency, unspecified: Principal | ICD-10-CM

## 2021-01-13 DIAGNOSIS — Z940 Kidney transplant status: Principal | ICD-10-CM

## 2021-01-13 MED ORDER — AMOXICILLIN 500 MG CAPSULE
ORAL_CAPSULE | Freq: Three times a day (TID) | ORAL | 0 refills | 10.00000 days | Status: CP
Start: 2021-01-13 — End: 2021-01-23

## 2021-01-13 NOTE — Unmapped (Signed)
Called patient.  Per Dr Margaretmary Bayley, patient to start Amoxicillin TID x 10 days for UTI.  Patient verbalized understanding.

## 2021-02-02 NOTE — Unmapped (Signed)
[facility] Clinical Assessment & Refill Coordination Note    Gina Hart, DOB: August 30, 1975  Phone: [phone] (home)     All above HIPAA information was verified with patient.     Was a Nurse, learning disability used for this call? No    Specialty Medication(s):   Transplant: cyclosporine 25mg      Current Outpatient Medications   Medication Sig Dispense Refill   ??? acetaminophen (TYLENOL) 325 MG tablet Take 1-2 tablets (325-650 mg total) by mouth every four (4) hours as needed for pain. 100 tablet 0   ??? amLODIPine (NORVASC) 5 MG tablet TAKE 2 TABLETS BY MOUTH ONCE DAILY AT BEDTIME. HOLD FOR SBP<90 AND/OR MAP<70. 60 tablet 11   ??? aspirin (ECOTRIN) 81 MG tablet TAKE 1 TABLET (81 MG) BY MOUTH DAILY 30 tablet 0   ??? biotin 5 mg cap Take 1 capsule (5,000 mcg total) by mouth daily. 30 capsule 11   ??? cyclobenzaprine (FLEXERIL) 10 MG tablet Take 1 tablet (10 mg total) by mouth two (2) times a day as needed for muscle spasms. 45 tablet 0   ??? cycloSPORINE modified (NEORAL) 25 MG capsule Take 5 capsules (125 mg total) by mouth two (2) times a day. 300 capsule 11   ??? ergocalciferol-1,250 mcg, 50,000 unit, (VITAMIN D2-1,250 MCG, 50,000 UNIT,) 1,250 mcg (50,000 unit) capsule Take 1 capsule (1,250 mcg total) by mouth once a week. 12 capsule 0   ??? famotidine (PEPCID) 20 MG tablet Take 1 tablet (20 mg total) by mouth daily as needed for heartburn. 30 tablet 6   ??? fluticasone propionate (FLONASE) 50 mcg/actuation nasal spray 1 spray by Each Nare route daily. 16 g 4   ??? gabapentin (NEURONTIN) 100 MG capsule Take 2 capsules (200 mg total) by mouth Three (3) times a day. 180 capsule 11   ??? loratadine (CLARITIN) 10 mg tablet Take 10 mg by mouth daily as needed for allergies.     ??? metoprolol tartrate (LOPRESSOR) 100 MG tablet Take 1 tablet (100 mg total) by mouth Two (2) times a day. 180 tablet 3   ??? MYFORTIC 180 mg EC tablet HOLD (Patient not taking: Reported on 07/09/2019) 60 tablet 11   ??? pravastatin (PRAVACHOL) 20 MG tablet Take 1 tablet (20 mg total) by mouth every evening. 30 tablet 11   ??? predniSONE (DELTASONE) 5 MG tablet Take 1 tablet (5 mg total) by mouth daily. 30 tablet 11   ??? rOPINIRole (REQUIP) 0.25 MG tablet Take 1 tablet (0.25 mg total) by mouth nightly. Take 1-3 hours before bedtime. 30 tablet 11   ??? sertraline (ZOLOFT) 100 MG tablet Take 1 tablet (100 mg total) by mouth daily. 90 tablet 3   ??? traMADol (ULTRAM) 50 mg tablet Take 50 mg by mouth every six (6) hours as needed for pain.     ??? traZODone (DESYREL) 50 MG tablet Take 1 tablet (50 mg total) by mouth nightly. 90 tablet 3     No current facility-administered medications for this visit.        Changes to medications: patient does not have med list with her to go through all meds today but says no recent changes    Allergies   Allergen Reactions   ??? Ibuprofen Swelling     Other reaction(s): SWELLING/EDEMA       Changes to allergies: No    SPECIALTY MEDICATION ADHERENCE     Cyclosporine 25mg   : 12 days of medicine on hand  Medication Adherence    Patient reported X missed doses in the last month: 0  Specialty Medication: cyclosporine 25mg           Specialty medication(s) dose(s) confirmed: Regimen is correct and unchanged.     Are there any concerns with adherence? No    Adherence counseling provided? Not needed    CLINICAL MANAGEMENT AND INTERVENTION      Clinical Benefit Assessment:    Do you feel the medicine is effective or helping your condition? Yes    Clinical Benefit counseling provided? Not needed    Adverse Effects Assessment:    Are you experiencing any side effects? No    Are you experiencing difficulty administering your medicine? No    Quality of Life Assessment:         How many days over the past month did your transplant  keep you from your normal activities? For example, brushing your teeth or getting up in the morning. 0    Have you discussed this with your provider? Not needed    Acute Infection Status:    Acute infections noted within Epic:  No active infections  Patient reported infection: None    Therapy Appropriateness:    Is therapy appropriate? Yes, therapy is appropriate and should be continued    DISEASE/MEDICATION-SPECIFIC INFORMATION      N/A    PATIENT SPECIFIC NEEDS     - Does the patient have any physical, cognitive, or cultural barriers? No    - Is the patient high risk? No    - Does the patient require a Care Management Plan? No     - Does the patient require physician intervention or other additional services (i.e. nutrition, smoking cessation, social work)? No      SHIPPING     Specialty Medication(s) to be Shipped:   Transplant: cyclosporine 25mg     Other medication(s) to be shipped: pravachol     Changes to insurance: No    Delivery Scheduled: Yes, Expected medication delivery date: 02/10/2021.     Medication will be delivered via UPS to the confirmed prescription address in [facility].    The patient will receive a drug information handout for each medication shipped and additional FDA Medication Guides as required.  Verified that patient has previously received a Conservation officer, historic buildings and a Surveyor, mining.    The patient or caregiver noted above participated in the development of this care plan and knows that they can request review of or adjustments to the care plan at any time.      All of the patient's questions and concerns have been addressed.    Thad Ranger   [facility] Specialty Pharmacist

## 2021-02-07 MED ORDER — ROPINIROLE 0.25 MG TABLET
ORAL_TABLET | ORAL | 2 refills | 0.00000 days | Status: CP
Start: 2021-02-07 — End: 2021-02-07

## 2021-02-07 NOTE — Unmapped (Signed)
Patient called stating she is out of Requip.  Prescription sent to [facility].  Called patient to notify.

## 2021-02-09 MED FILL — PRAVASTATIN 20 MG TABLET: ORAL | 30 days supply | Qty: 30 | Fill #1

## 2021-02-09 MED FILL — CYCLOSPORINE MODIFIED 25 MG CAPSULE: ORAL | 30 days supply | Qty: 300 | Fill #6

## 2021-02-22 NOTE — Unmapped (Signed)
Returned patient's call regarding labs.  Lab orders placed for Vitamin D, Urine culture, and EBV.  Patient states she has no UTI symptoms, but occasionally has urgency.  Patient will leave urine sample in addition to labs tomorrow.

## 2021-02-23 LAB — BASIC METABOLIC PANEL
ANION GAP: 9.8 mmol/L (ref 2.0–15.0)
BLOOD UREA NITROGEN: 17 (ref 7–18)
BUN / CREAT RATIO: 21.3 — ABNORMAL HIGH (ref 10.00–16.00)
CALCIUM: 9.4 (ref 8.5–10.1)
CHLORIDE: 102 mmol/L (ref 100–108)
CO2: 25.2 mmol/L (ref 21–32)
CREATININE: 0.8 (ref 0.6–1.3)
EGFR CKD-EPI AA MALE: 93 — ABNORMAL LOW
GLUCOSE RANDOM: 95 mg/dL (ref 65–99)
MAGNESIUM: 1.8 (ref 1.8–2.4)
PHOSPHORUS: 3.5 (ref 2.6–4.7)
POTASSIUM: 3.7 mmol/L (ref 3.5–5.1)
SODIUM: 137 mmol/L (ref 136–145)

## 2021-02-23 LAB — URINALYSIS WITH CULTURE REFLEX
BILIRUBIN UA: NEGATIVE
GLUCOSE UA: NEGATIVE
KETONES UA: NEGATIVE
LEUKOCYTE ESTERASE UA: NEGATIVE
NITRITE UA: NEGATIVE
PH UA: 5.5 (ref 4.5–8)
PROTEIN UA: NEGATIVE
SPECIFIC GRAVITY UA: 1.02 (ref 1.005–1.030)
UROBILINOGEN UA: 0.2

## 2021-02-23 LAB — PROTEIN / CREATININE RATIO, URINE
CREATININE, URINE: 78.6
PROTEIN URINE: 8.7
PROTEIN/CREAT RATIO, URINE: 111 (ref 0–200)

## 2021-02-23 LAB — HEPATIC FUNCTION PANEL
ALBUMIN: 3.7 (ref 2.5–5.0)
ALKALINE PHOSPHATASE: 108
ALT (SGPT): 29 U/L — ABNORMAL LOW (ref 30–65)
AST (SGOT): 19 U/L (ref 15–70)
BILIRUBIN TOTAL: 0.4 (ref 0.0–1.0)

## 2021-02-23 LAB — CBC W/ DIFFERENTIAL
BASOPHILS ABSOLUTE COUNT: 0 — ABNORMAL LOW (ref 0.01–0.09)
BASOPHILS RELATIVE PERCENT: 0.7 % (ref 0.0–2.0)
EOSINOPHILS ABSOLUTE COUNT: 0.1 (ref 0.04–0.5)
EOSINOPHILS RELATIVE PERCENT: 2.5 % (ref 1.0–8.0)
HEMATOCRIT: 39.5 (ref 35–48)
HEMOGLOBIN: 13.4 (ref 11.9–16.0)
LYMPHOCYTES ABSOLUTE COUNT: 1 (ref 1.0–2.7)
LYMPHOCYTES RELATIVE PERCENT: 23.2 % (ref 20–45)
MEAN CORPUSCULAR HEMOGLOBIN CONC: 34 (ref 33.3–35.5)
MEAN CORPUSCULAR HEMOGLOBIN: 30.6 pg (ref 25.8–33.0)
MEAN CORPUSCULAR VOLUME: 89.9 fl (ref 78–95)
MEAN PLATELET VOLUME: 8.7 fl (ref 7.5–11.2)
MONOCYTES ABSOLUTE COUNT: 0.6 (ref 0.2–0.8)
MONOCYTES RELATIVE PERCENT: 13.5 % — ABNORMAL HIGH (ref 4.0–12.0)
NEUTROPHILS ABSOLUTE COUNT: 2.5
NEUTROPHILS RELATIVE PERCENT: 60.1 % (ref 42–74)
PLATELET COUNT: 225 (ref 150–480)
RED BLOOD CELL COUNT: 4.4 (ref 4.0–5.3)
RED CELL DISTRIBUTION WIDTH: 12.4 % (ref 12.1–15.0)
WHITE BLOOD CELL COUNT: 4.2

## 2021-02-23 LAB — GAMMA GT: GAMMA GLUTAMYL TRANSFERASE: 52

## 2021-02-23 LAB — LIPID PANEL
CHOLESTEROL: 213 — ABNORMAL HIGH (ref 50–200)
HDL CHOLESTEROL: 59 (ref 35–60)
LDL CHOLESTEROL CALCULATED: 119 (ref 5.0–130.0)
LDL/HDL RATIO: 3.61
TRIGLYCERIDES: 133 (ref 30–200)

## 2021-02-23 LAB — VITAMIN D 25 HYDROXY: VITAMIN D, TOTAL (25OH): 33.8

## 2021-02-24 ENCOUNTER — Ambulatory Visit: Admit: 2021-02-24 | Discharge: 2021-02-25 | Payer: MEDICARE

## 2021-03-01 NOTE — Unmapped (Signed)
Returned patient's call.  Patient wanted to make sure it is ok to take Acyclovir.  Coordinator confirmed it is ok to take this medication.  Patient verbalized understanding.

## 2021-03-03 NOTE — Unmapped (Signed)
[facility] Refill Coordination Note    Specialty Medication(s) to be Shipped:   Transplant: cyclosporine 25mg   Other medication(s) to be shipped: Pravastatin 20mg      Gina Hart, DOB: January 10, 1976  Phone: [phone] (home)     All above HIPAA information was verified with patient.     Was a Nurse, learning disability used for this call? No    Completed refill call assessment today to schedule patient's medication shipment from the [facility] [phone]).  All relevant notes have been reviewed.     Specialty medication(s) and dose(s) confirmed: Regimen is correct and unchanged.   Changes to medications: Cary reports no changes at this time.  Changes to insurance: No  New side effects reported not previously addressed with a pharmacist or physician: None reported  Questions for the pharmacist: No    Confirmed patient received a Conservation officer, historic buildings and a Surveyor, mining with first shipment. The patient will receive a drug information handout for each medication shipped and additional FDA Medication Guides as required.       DISEASE/MEDICATION-SPECIFIC INFORMATION        N/A    SPECIALTY MEDICATION ADHERENCE     Medication Adherence    Patient reported X missed doses in the last month: 0  Specialty Medication: cyclosporine 25mg   Patient is on additional specialty medications: No  Patient is on more than two specialty medications: No  Informant: patient  Reliability of informant: reliable  Reasons for non-adherence: no problems identified  Confirmed plan for next specialty medication refill: delivery by pharmacy  Refills needed for supportive medications: not needed        Were doses missed due to medication being on hold? No    Cyclosoprine 25 mg: 10 days of medicine on hand     REFERRAL TO PHARMACIST     Referral to the pharmacist: Not needed    [facility]     Shipping address confirmed in Epic.     Delivery Scheduled: Yes, Expected medication delivery date: 03/09/2021.     Medication will be delivered via UPS to the prescription address in Epic WAM.    Kelin Borum P [facility] [place] [facility] Specialty Technician

## 2021-03-04 LAB — EBV QUANTITATIVE PCR, BLOOD: EPSTEIN-BARR DNA QUANT, PCR: POSITIVE

## 2021-03-04 LAB — CYCLOSPORINE LEVEL: CYCLOSPORINE (FPIA) BLOOD: 185 ng/mL (ref 100–400)

## 2021-03-04 LAB — VITAMIN D 1,25 DIHYDROXY: VITAMIN D 1,25-DIHYDROXY: 64.3

## 2021-03-07 NOTE — Unmapped (Signed)
Called patient regarding lab result of positive urine culture.  Patient denies any UTI symptoms.  Patient will repeat labs later this week and notify if any symptoms develop.

## 2021-03-08 MED FILL — PRAVASTATIN 20 MG TABLET: ORAL | 30 days supply | Qty: 30 | Fill #2

## 2021-03-08 MED FILL — CYCLOSPORINE MODIFIED 25 MG CAPSULE: ORAL | 30 days supply | Qty: 300 | Fill #7

## 2021-03-29 DIAGNOSIS — Z940 Kidney transplant status: Principal | ICD-10-CM

## 2021-03-29 DIAGNOSIS — G4700 Insomnia, unspecified: Principal | ICD-10-CM

## 2021-03-29 MED ORDER — TRAZODONE 50 MG TABLET
ORAL_TABLET | 0 refills | 0 days
Start: 2021-03-29 — End: ?

## 2021-03-31 MED FILL — CYCLOSPORINE MODIFIED 25 MG CAPSULE: ORAL | 30 days supply | Qty: 300 | Fill #8

## 2021-03-31 NOTE — Unmapped (Signed)
[facility] Refill Coordination Note    Specialty Medication(s) to be Shipped:   Transplant: cyclosporine 25mg     Other medication(s) to be shipped: No additional medications requested for fill at this time     Ketzaly Cardella, DOB: 1975/10/19  Phone: [phone] (home)       All above HIPAA information was verified with patient.     Was a Nurse, learning disability used for this call? No    Completed refill call assessment today to schedule patient's medication shipment from the [facility] [phone]).  All relevant notes have been reviewed.     Specialty medication(s) and dose(s) confirmed: Regimen is correct and unchanged.   Changes to medications: Clyda reports stopping the following medications: pravastatin  Changes to insurance: No  New side effects reported not previously addressed with a pharmacist or physician: None reported  Questions for the pharmacist: No    Confirmed patient received a Conservation officer, historic buildings and a Surveyor, mining with first shipment. The patient will receive a drug information handout for each medication shipped and additional FDA Medication Guides as required.       DISEASE/MEDICATION-SPECIFIC INFORMATION        N/A    SPECIALTY MEDICATION ADHERENCE     Medication Adherence    Patient reported X missed doses in the last month: 0  Specialty Medication: Cyclosporine 25mg   Patient is on additional specialty medications: No              Were doses missed due to medication being on hold? No    Cyclosporine 25 mg: 7 days of medicine on hand       REFERRAL TO PHARMACIST     Referral to the pharmacist: Not needed      [facility]     Shipping address confirmed in Epic.     Delivery Scheduled: Yes, Expected medication delivery date: 04/01/21.     Medication will be delivered via UPS to the prescription address in Epic WAM.    Tera Helper   [facility] Specialty Pharmacist

## 2021-04-11 NOTE — Unmapped (Signed)
Returned patient's call.  Unable to leave message due to mailbox full.

## 2021-04-13 DIAGNOSIS — Z940 Kidney transplant status: Principal | ICD-10-CM

## 2021-04-13 NOTE — Unmapped (Signed)
Spoke with patient regarding labs.  [place] and would like lab orders placed for [facility].  Orders placed and last month's labs reviewd with patient.

## 2021-04-25 DIAGNOSIS — Z940 Kidney transplant status: Principal | ICD-10-CM

## 2021-05-09 DIAGNOSIS — Z940 Kidney transplant status: Principal | ICD-10-CM

## 2021-05-13 NOTE — Unmapped (Signed)
[facility] Refill Coordination Note    Specialty Medication(s) to be Shipped:   Transplant: cyclosporine 25mg     Other medication(s) to be shipped: No additional medications requested for fill at this time     Kiffany Schelling, DOB: 04-Oct-1975  Phone: [phone] (home)       All above HIPAA information was verified with patient.     Was a Nurse, learning disability used for this call? No    Completed refill call assessment today to schedule patient's medication shipment from the [facility] [phone]).  All relevant notes have been reviewed.     Specialty medication(s) and dose(s) confirmed: Regimen is correct and unchanged.   Changes to medications: Tate reports no changes at this time.  Changes to insurance: No  New side effects reported not previously addressed with a pharmacist or physician: None reported  Questions for the pharmacist: No    Confirmed patient received a Conservation officer, historic buildings and a Surveyor, mining with first shipment. The patient will receive a drug information handout for each medication shipped and additional FDA Medication Guides as required.       DISEASE/MEDICATION-SPECIFIC INFORMATION        N/A    SPECIALTY MEDICATION ADHERENCE     Medication Adherence    Patient reported X missed doses in the last month: 0  Specialty Medication: Cyclosporine 25mg   Patient is on additional specialty medications: No              Were doses missed due to medication being on hold? No    Cyclosporine 25 mg: 7 days of medicine on hand        REFERRAL TO PHARMACIST     Referral to the pharmacist: Not needed      [facility]     Shipping address confirmed in Epic.     Delivery Scheduled: Yes, Expected medication delivery date: 05/18/21.     Medication will be delivered via UPS to the prescription address in Epic WAM.    Tera Helper   [facility] Specialty Pharmacist

## 2021-05-17 MED FILL — CYCLOSPORINE MODIFIED 25 MG CAPSULE: ORAL | 30 days supply | Qty: 300 | Fill #9

## 2021-05-23 DIAGNOSIS — Z940 Kidney transplant status: Principal | ICD-10-CM

## 2021-05-25 DIAGNOSIS — Z940 Kidney transplant status: Principal | ICD-10-CM

## 2021-05-25 MED ORDER — AMLODIPINE 5 MG TABLET
ORAL_TABLET | 11 refills | 0 days
Start: 2021-05-25 — End: ?

## 2021-05-26 DIAGNOSIS — Z940 Kidney transplant status: Principal | ICD-10-CM

## 2021-05-26 LAB — CYCLOSPORINE LEVEL: CYCLOSPORINE (FPIA) BLOOD: 169 ng/mL (ref 100–400)

## 2021-05-26 LAB — VITAMIN D 1,25 DIHYDROXY: VITAMIN D 1,25-DIHYDROXY: 67

## 2021-05-26 MED ORDER — AMLODIPINE 5 MG TABLET
ORAL_TABLET | Freq: Every day | ORAL | 11 refills | 60.00000 days | Status: CP
Start: 2021-05-26 — End: 2021-05-26

## 2021-05-26 MED ORDER — BIOTIN 5 MG CAPSULE
ORAL_CAPSULE | Freq: Every day | ORAL | 11 refills | 30 days | Status: CP
Start: 2021-05-26 — End: ?

## 2021-06-06 DIAGNOSIS — Z940 Kidney transplant status: Principal | ICD-10-CM

## 2021-06-08 NOTE — Unmapped (Signed)
[facility] Refill Coordination Note    Specialty Medication(s) to be Shipped:   Transplant: cyclosporine 25mg     Other medication(s) to be shipped: No additional medications requested for fill at this time     Gina Hart, DOB: 11-17-1975  Phone: [phone] (home)       All above HIPAA information was verified with patient.     Was a Nurse, learning disability used for this call? No    Completed refill call assessment today to schedule patient's medication shipment from the [facility] [phone]).  All relevant notes have been reviewed.     Specialty medication(s) and dose(s) confirmed: Regimen is correct and unchanged.   Changes to medications: Evynn reports no changes at this time.  Changes to insurance: No  New side effects reported not previously addressed with a pharmacist or physician: None reported  Questions for the pharmacist: No    Confirmed patient received a Conservation officer, historic buildings and a Surveyor, mining with first shipment. The patient will receive a drug information handout for each medication shipped and additional FDA Medication Guides as required.       DISEASE/MEDICATION-SPECIFIC INFORMATION        N/A    SPECIALTY MEDICATION ADHERENCE     Medication Adherence    Patient reported X missed doses in the last month: 0  Specialty Medication: Cyclosporine 25mg   Patient is on additional specialty medications: No        Were doses missed due to medication being on hold? No    Cyclosporine 25 mg: 9 days of medicine on hand     REFERRAL TO PHARMACIST     Referral to the pharmacist: Not needed      [facility]     Shipping address confirmed in Epic.     Delivery Scheduled: Yes, Expected medication delivery date: 06/13/2021.     Medication will be delivered via UPS to the prescription address in Epic WAM.    Lorelei Pont [facility] Specialty Technician

## 2021-06-10 MED FILL — CYCLOSPORINE MODIFIED 25 MG CAPSULE: ORAL | 30 days supply | Qty: 300 | Fill #10

## 2021-06-14 LAB — EBV QUANTITATIVE PCR, BLOOD: EPSTEIN-BARR DNA QUANT, PCR: <35

## 2021-06-20 DIAGNOSIS — Z940 Kidney transplant status: Principal | ICD-10-CM

## 2021-06-30 DIAGNOSIS — Z940 Kidney transplant status: Principal | ICD-10-CM

## 2021-07-04 DIAGNOSIS — Z940 Kidney transplant status: Principal | ICD-10-CM

## 2021-07-04 LAB — CYCLOSPORINE LEVEL: CYCLOSPORINE (FPIA) BLOOD: 183 ng/mL (ref 100–400)

## 2021-07-05 NOTE — Unmapped (Signed)
Patient called with concerns of elevated blood pressure readings over the weekend to the extent that she called EMS on Sunday due to a reading of 198-117. She was asymptomatic but worried about the high reading. She stated EMS came and assessed her and a repeat BP was 151/103. She was told to follow-up with her PCP so she saw him yesterday, her BP was 138/87, and her amlodipine was increased to 7.5 mg so she took it yesterday evening. BP today was 143/88. She did state she had had increased sodium intake over the past couple of weeks. She will reduce the sodium in her diet, hydrate and monitor her BP since making the dose adjustment to amlodipine. Asked her to call back if BP remains elevated or she develops any issues.

## 2021-07-05 NOTE — Unmapped (Signed)
[facility] Clinical Assessment & Refill Coordination Note    Gina Hart, DOB: Dec 12, 1975  Phone: [phone] (home)     All above HIPAA information was verified with patient.     Was a Nurse, learning disability used for this call? No    Specialty Medication(s):   Transplant: cyclosporine 25mg      Current Outpatient Medications   Medication Sig Dispense Refill   ??? acetaminophen (TYLENOL) 325 MG tablet Take 1-2 tablets (325-650 mg total) by mouth every four (4) hours as needed for pain. 100 tablet 0   ??? amLODIPine (NORVASC) 5 MG tablet Take 1 tablet (5 mg total) by mouth daily. 60 tablet 11   ??? aspirin (ECOTRIN) 81 MG tablet TAKE 1 TABLET (81 MG) BY MOUTH DAILY 30 tablet 0   ??? biotin 5 mg cap Take 1 capsule (5,000 mcg total) by mouth daily. 30 capsule 11   ??? cyclobenzaprine (FLEXERIL) 10 MG tablet Take 1 tablet (10 mg total) by mouth two (2) times a day as needed for muscle spasms. 45 tablet 0   ??? cycloSPORINE modified (NEORAL) 25 MG capsule Take 5 capsules (125 mg total) by mouth two (2) times a day. 300 capsule 11   ??? ergocalciferol-1,250 mcg, 50,000 unit, (VITAMIN D2-1,250 MCG, 50,000 UNIT,) 1,250 mcg (50,000 unit) capsule Take 1 capsule (1,250 mcg total) by mouth once a week. 12 capsule 0   ??? famotidine (PEPCID) 20 MG tablet Take 1 tablet (20 mg total) by mouth daily as needed for heartburn. 30 tablet 6   ??? fluticasone propionate (FLONASE) 50 mcg/actuation nasal spray 1 spray by Each Nare route daily. 16 g 4   ??? gabapentin (NEURONTIN) 100 MG capsule Take 2 capsules (200 mg total) by mouth Three (3) times a day. 180 capsule 11   ??? loratadine (CLARITIN) 10 mg tablet Take 10 mg by mouth daily as needed for allergies.     ??? metoprolol tartrate (LOPRESSOR) 100 MG tablet Take 1 tablet (100 mg total) by mouth Two (2) times a day. 180 tablet 3   ??? MYFORTIC 180 mg EC tablet HOLD (Patient not taking: Reported on 07/09/2019) 60 tablet 11   ??? pravastatin (PRAVACHOL) 20 MG tablet Take 1 tablet (20 mg total) by mouth every evening. (Patient not taking: Reported on 07/05/2021) 30 tablet 11   ??? predniSONE (DELTASONE) 5 MG tablet Take 1 tablet (5 mg total) by mouth daily. 30 tablet 11   ??? rOPINIRole (REQUIP) 0.25 MG tablet Take one tablet by mouth 1-3 hours before bedtime. 30 tablet 2   ??? sertraline (ZOLOFT) 100 MG tablet Take 1 tablet (100 mg total) by mouth daily. 90 tablet 3   ??? traMADol (ULTRAM) 50 mg tablet Take 50 mg by mouth every six (6) hours as needed for pain.     ??? traZODone (DESYREL) 50 MG tablet Take 1 tablet (50 mg total) by mouth nightly. 90 tablet 3     No current facility-administered medications for this visit.        Changes to medications: Kaprice reports no changes at this time.    Allergies   Allergen Reactions   ??? Ibuprofen Swelling     Other reaction(s): SWELLING/EDEMA       Changes to allergies: No    SPECIALTY MEDICATION ADHERENCE     Cyclosporine 25mg   : 6 days of medicine on hand       Medication Adherence    Patient reported X missed doses in the last month: 0  Specialty Medication: cyclosporine 25mg           Specialty medication(s) dose(s) confirmed: Regimen is correct and unchanged.     Are there any concerns with adherence? No    Adherence counseling provided? Not needed    CLINICAL MANAGEMENT AND INTERVENTION      Clinical Benefit Assessment:    Do you feel the medicine is effective or helping your condition? Yes    Clinical Benefit counseling provided? Not needed    Adverse Effects Assessment:    Are you experiencing any side effects? No    Are you experiencing difficulty administering your medicine? No    Quality of Life Assessment:         How many days over the past month did your transplant  keep you from your normal activities? For example, brushing your teeth or getting up in the morning. 0    Have you discussed this with your provider? Not needed    Acute Infection Status:    Acute infections noted within Epic:  No active infections  Patient reported infection: None    Therapy Appropriateness:    Is therapy appropriate and patient progressing towards therapeutic goals? Yes, therapy is appropriate and should be continued    DISEASE/MEDICATION-SPECIFIC INFORMATION      N/A    PATIENT SPECIFIC NEEDS     - Does the patient have any physical, cognitive, or cultural barriers? No    - Is the patient high risk? Yes, patient is taking a REMS drug. Medication is dispensed in compliance with REMS program not from [facility]    - Does the patient require a Care Management Plan? No       SHIPPING     Specialty Medication(s) to be Shipped:   Transplant: cyclosporine 25mg     Other medication(s) to be shipped: No additional medications requested for fill at this time     Changes to insurance: No    Delivery Scheduled: Yes, Expected medication delivery date: 07/08/2021.     Medication will be delivered via UPS to the confirmed prescription address in [facility].    The patient will receive a drug information handout for each medication shipped and additional FDA Medication Guides as required.  Verified that patient has previously received a Conservation officer, historic buildings and a Surveyor, mining.    The patient or caregiver noted above participated in the development of this care plan and knows that they can request review of or adjustments to the care plan at any time.      All of the patient's questions and concerns have been addressed.    Thad Ranger   [facility] Specialty Pharmacist

## 2021-07-07 MED FILL — CYCLOSPORINE MODIFIED 25 MG CAPSULE: ORAL | 30 days supply | Qty: 300 | Fill #11

## 2021-07-18 DIAGNOSIS — Z940 Kidney transplant status: Principal | ICD-10-CM

## 2021-07-18 DIAGNOSIS — G4700 Insomnia, unspecified: Principal | ICD-10-CM

## 2021-07-18 MED ORDER — TRAZODONE 50 MG TABLET
ORAL_TABLET | Freq: Every evening | ORAL | 3 refills | 90 days | Status: CP
Start: 2021-07-18 — End: 2022-07-18

## 2021-08-01 DIAGNOSIS — R7989 Other specified abnormal findings of blood chemistry: Principal | ICD-10-CM

## 2021-08-01 DIAGNOSIS — R82998 Other abnormal findings in urine: Principal | ICD-10-CM

## 2021-08-01 DIAGNOSIS — Z940 Kidney transplant status: Principal | ICD-10-CM

## 2021-08-02 DIAGNOSIS — Z940 Kidney transplant status: Principal | ICD-10-CM

## 2021-08-02 MED ORDER — CYCLOSPORINE MODIFIED 25 MG CAPSULE
ORAL_CAPSULE | Freq: Two times a day (BID) | ORAL | 11 refills | 30 days | Status: CP
Start: 2021-08-02 — End: 2022-08-02
  Filled 2021-08-04: qty 300, 30d supply, fill #0

## 2021-08-02 NOTE — Unmapped (Signed)
Pt request for RX Refill cycloSPORINE modified (NEORAL) 25 MG capsule

## 2021-08-02 NOTE — Unmapped (Signed)
[facility] Refill Coordination Note    Specialty Medication(s) to be Shipped:   Transplant: cyclosporine 25mg     Other medication(s) to be shipped: No additional medications requested for fill at this time     Gina Hart, DOB: Aug 14, 1975  Phone: [phone] (home)       All above HIPAA information was verified with patient.     Was a Nurse, learning disability used for this call? No    Completed refill call assessment today to schedule patient's medication shipment from the [facility] [phone]).  All relevant notes have been reviewed.     Specialty medication(s) and dose(s) confirmed: Regimen is correct and unchanged.   Changes to medications: Gina Hart reports no changes at this time.  Changes to insurance: No  New side effects reported not previously addressed with a pharmacist or physician: None reported  Questions for the pharmacist: No    Confirmed patient received a Conservation officer, historic buildings and a Surveyor, mining with first shipment. The patient will receive a drug information handout for each medication shipped and additional FDA Medication Guides as required.       DISEASE/MEDICATION-SPECIFIC INFORMATION        N/A    SPECIALTY MEDICATION ADHERENCE     Medication Adherence    Patient reported X missed doses in the last month: 0  Specialty Medication: Cyclosporine  Patient is on additional specialty medications: No  Any gaps in refill history greater than 2 weeks in the last 3 months: no  Demonstrates understanding of importance of adherence: yes  Informant: patient  Reliability of informant: reliable  Confirmed plan for next specialty medication refill: delivery by pharmacy  Refills needed for supportive medications: not needed        Were doses missed due to medication being on hold? No    Cyclosporine 25 mg: 9 days of medicine on hand     REFERRAL TO PHARMACIST     Referral to the pharmacist: Not needed      [facility]     Shipping address confirmed in Epic.     Delivery Scheduled: Yes, Expected medication delivery date: 08/05/2021.     Medication will be delivered via UPS to the prescription address in Epic WAM.    Gina Hart D Gina Hart   [facility] Specialty Technician

## 2021-08-04 ENCOUNTER — Ambulatory Visit: Admit: 2021-08-04 | Discharge: 2021-08-04 | Payer: MEDICARE

## 2021-08-04 ENCOUNTER — Ambulatory Visit: Admit: 2021-08-04 | Discharge: 2021-08-04 | Payer: MEDICARE | Attending: Nephrology | Primary: Nephrology

## 2021-08-04 DIAGNOSIS — Z940 Kidney transplant status: Principal | ICD-10-CM

## 2021-08-04 LAB — PROTEIN / CREATININE RATIO, URINE
CREATININE, URINE: 41.1 mg/dL
PROTEIN URINE: 6.3 mg/dL
PROTEIN/CREAT RATIO, URINE: 0.153

## 2021-08-04 LAB — LIPID PANEL
CHOLESTEROL/HDL RATIO SCREEN: 2.6 (ref 1.0–4.5)
CHOLESTEROL: 162 mg/dL (ref <=200)
HDL CHOLESTEROL: 63 mg/dL — ABNORMAL HIGH (ref 40–60)
LDL CHOLESTEROL CALCULATED: 82 mg/dL (ref 40–99)
NON-HDL CHOLESTEROL: 99 mg/dL (ref 70–130)
TRIGLYCERIDES: 87 mg/dL (ref 0–150)
VLDL CHOLESTEROL CAL: 17.4 mg/dL (ref 9–37)

## 2021-08-04 LAB — COMPREHENSIVE METABOLIC PANEL
ALBUMIN: 4.2 g/dL (ref 3.4–5.0)
ALKALINE PHOSPHATASE: 103 U/L (ref 46–116)
ALT (SGPT): 28 U/L (ref 10–49)
ANION GAP: 9 mmol/L (ref 5–14)
AST (SGOT): 21 U/L (ref <=34)
BILIRUBIN TOTAL: 0.6 mg/dL (ref 0.3–1.2)
BLOOD UREA NITROGEN: 16 mg/dL (ref 9–23)
BUN / CREAT RATIO: 18
CALCIUM: 10.4 mg/dL (ref 8.7–10.4)
CHLORIDE: 104 mmol/L (ref 98–107)
CO2: 24 mmol/L (ref 20.0–31.0)
CREATININE: 0.88 mg/dL — ABNORMAL HIGH
EGFR CKD-EPI (2021) FEMALE: 82 mL/min/{1.73_m2} (ref >=60)
GLUCOSE RANDOM: 95 mg/dL (ref 70–99)
POTASSIUM: 3.9 mmol/L (ref 3.4–4.8)
PROTEIN TOTAL: 8.5 g/dL — ABNORMAL HIGH (ref 5.7–8.2)
SODIUM: 137 mmol/L (ref 135–145)

## 2021-08-04 LAB — URINALYSIS WITH MICROSCOPY
BACTERIA: NONE SEEN /HPF
BILIRUBIN UA: NEGATIVE
BLOOD UA: NEGATIVE
GLUCOSE UA: NEGATIVE
KETONES UA: NEGATIVE
LEUKOCYTE ESTERASE UA: NEGATIVE
NITRITE UA: NEGATIVE
PH UA: 6 (ref 5.0–9.0)
PROTEIN UA: NEGATIVE
RBC UA: <1 /HPF (ref <4)
SPECIFIC GRAVITY UA: <=1.005 (ref 1.005–1.030)
SQUAMOUS EPITHELIAL: 5 /HPF (ref 0–5)
UROBILINOGEN UA: 0.2
WBC UA: 1 /HPF (ref 0–5)

## 2021-08-04 LAB — BILIRUBIN, DIRECT: BILIRUBIN DIRECT: 0.2 mg/dL (ref 0.00–0.30)

## 2021-08-04 LAB — CBC W/ AUTO DIFF
BASOPHILS ABSOLUTE COUNT: 0 10*9/L (ref 0.0–0.1)
BASOPHILS RELATIVE PERCENT: 0.5 %
EOSINOPHILS ABSOLUTE COUNT: 0.1 10*9/L (ref 0.0–0.5)
EOSINOPHILS RELATIVE PERCENT: 2.9 %
HEMATOCRIT: 42 % (ref 34.0–44.0)
HEMOGLOBIN: 14.2 g/dL (ref 11.3–14.9)
LYMPHOCYTES ABSOLUTE COUNT: 0.9 10*9/L — ABNORMAL LOW (ref 1.1–3.6)
LYMPHOCYTES RELATIVE PERCENT: 19.9 %
MEAN CORPUSCULAR HEMOGLOBIN CONC: 33.8 g/dL (ref 32.0–36.0)
MEAN CORPUSCULAR HEMOGLOBIN: 30.4 pg (ref 25.9–32.4)
MEAN CORPUSCULAR VOLUME: 90 fL (ref 77.6–95.7)
MEAN PLATELET VOLUME: 8.1 fL (ref 6.8–10.7)
MONOCYTES ABSOLUTE COUNT: 0.6 10*9/L (ref 0.3–0.8)
MONOCYTES RELATIVE PERCENT: 13.6 %
NEUTROPHILS ABSOLUTE COUNT: 2.8 10*9/L (ref 1.8–7.8)
NEUTROPHILS RELATIVE PERCENT: 63.1 %
PLATELET COUNT: 254 10*9/L (ref 150–450)
RED BLOOD CELL COUNT: 4.67 10*12/L (ref 3.95–5.13)
RED CELL DISTRIBUTION WIDTH: 12.9 % (ref 12.2–15.2)
WBC ADJUSTED: 4.4 10*9/L (ref 3.6–11.2)

## 2021-08-04 LAB — PHOSPHORUS: PHOSPHORUS: 3.5 mg/dL (ref 2.4–5.1)

## 2021-08-04 LAB — MAGNESIUM: MAGNESIUM: 1.9 mg/dL (ref 1.6–2.6)

## 2021-08-04 LAB — HEMOGLOBIN A1C
ESTIMATED AVERAGE GLUCOSE: 111 mg/dL
HEMOGLOBIN A1C: 5.5 % (ref 4.8–5.6)

## 2021-08-04 LAB — CYCLOSPORINE, TROUGH: CYCLOSPORINE, TROUGH: 230 ng/mL (ref 100–400)

## 2021-08-04 MED ORDER — MELATONIN 5 MG TABLET
ORAL_TABLET | Freq: Every evening | ORAL | 11 refills | 30 days | Status: CP | PRN
Start: 2021-08-04 — End: ?

## 2021-08-04 MED ORDER — WEGOVY 0.25 MG/0.5 ML SUBCUTANEOUS PEN INJECTOR
SUBCUTANEOUS | 11 refills | 0 days | Status: CP
Start: 2021-08-04 — End: ?

## 2021-08-04 MED ORDER — MELATONIN 5 MG CAPSULE
ORAL_CAPSULE | ORAL | 11 refills | 0.00000 days | Status: CP
Start: 2021-08-04 — End: 2021-08-04

## 2021-08-04 NOTE — Unmapped (Signed)
Transplant Coordinator, Clinic Visit   Pt seen today by transplant nephrology for follow up, reviewed medications and symptoms.          08/04/21 0926   BP: 135/91   Pulse: 77   Temp: 36.4 ??C (97.6 ??F)   Weight: 98.8 kg (217 lb 12.8 oz)   Height: 149.9 cm (4' 11)   PainSc: 0-No pain       Assessment  BP: checks at home, unable to remember readings  Lightheaded: reports vertigo occasionally with standing  Headache: denies  Hand tremors: denies  Numbness/tingling: denies  Fevers: denies  Chills/sweats: denies  Shortness of breath: denies  Chest pain or pressure: denies  Palpitations: denies  Abdominal pain: denies  Heart burn: denies  Nausea/vomiting: denies  Diarrhea/constipation: denies  UTI symptoms: none  Swelling: none  Sleep: trouble staying asleep.  Per Dr Margaretmary Bayley, Trazadone stopped and started Melatonin.  Restart Requip  Pain: denies      Good appetite; reports adequate hydration.     Intake: reports 5 bottles of water daily  Output: reports adequate    Any new medications? none  Immunosuppressant last taken: 2130 2/8    Immunization status: Flu up to date, refuses COVID vaccine, Prevnar 20 today    Functional Score: 100   Normal no complaints; no evidence of  disease.       I spent a total of 25 minutes with Gina Hart reviewing medications and symptoms.

## 2021-08-04 NOTE — Unmapped (Signed)
Pharmacist Jeanette Caprice from [facility] pharmacy ph [phone] calling regarding melatonin prescription. Sent epic chat to Dr. Margaretmary Bayley and Laury Deep. Pt seen in clinic today.

## 2021-08-04 NOTE — Unmapped (Unsigned)
Transplant Nephrology Clinic Visit    ??  Assessment and Plan  Gina Hart is a 46 y.o. female who is s/p deceased donor kidney transplant on 01/11/17. She is evaluated today for follow up of her kidney transplant, immunosuppression and associated medical problems. Issues addressed today include:    Status post deceased donor kidney transplant 01/11/17 with no history of rejection or evidence of FSGS recurrence.   - Serum creatinine is stable at 0.90 on 09/14/20 (baseline <1.0 mg/dL).   - UP/C has been historically normal (0.20 on 09/14/20).   - DSA screen was negative on 06/10/19 though class II could not be checked due to an interfering substance.    Immunosuppression management.   - Cyclosporine level 199 on 09/14/20 (target trough 125-200)  - Will continue cyclosporine 125 mg BID and prednisone 5 mg daily.   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   - PET CT 06/25/19 done at [facility] revealed symmetric hypermetabolic activity in the lingual tonsils without evidence of lymphadenopathy elsewhere. ENT evaluation on 07/09/19 revealed enlarged tonsils, enlarged turbinates, and congested nasal mucosa. Repeat PET CT at [facility] on 07/09/19 revealed only slightly increased uptake in the lingual tonsils without evidence of malignancy.  - EBV viral load has been gradually declining after Myfortic was held with most recent value of <35 on 05/23/21.  - She denies tonsillar enlargement or sore throat.     - Will continue to follow periodic EBV viral loads.     Hypertension.    - Home BP 120's/60s  - Edema persists on amlodipine   - Continue metoprolol tartrate 100mg  bid and amlodipine 10 mg daily.      Back pain, neck pain, left leg pain with history of spinal fusion and cervical spinal stenosis   - She is s/p spinal fusion of thoracic spine through L2-3  - She needs spine clinic appointment  - No longer on gabapentin due to side effects    Hyperlipidemia management  - Needs statin in future    Immunizations/Infection Prevention  Flu vaccine Fall 2022  Prevnar-20 today 08/04/21  COVID-19 vaccine booster was refused today.         Obesity  - Semaglutide 0.25 mg weekly started today    Memory Loss/Confusion.  - Brain MRI 07/06/18 and 06/15/20 was normal.   - Symptoms initially improved after switching to cyclosporine from tacrolimus, but appear now to be persistent.  - Symptoms worsened after recent COVID infection  - Sleep study 12/17/19 revealed no sleep disordered breathing with mild to moderate periodic limb movement.  - Will start melatonin for insomnia and resume requip for RLS. She will stop trazodone.   - Seen by Neurology Dr. Sherryll Burger at [facility] 11/09/20 with suggestion to undergo neuropsychiatric testing.     Cholesterol Plaque  on Ophthalmology Exam  - Patient reports this finding was made at a recent ophtho appointment.   - She may need carotid dopplers or other imaging of cerebrovascular system. This was not addressed at her Neuro appointment  - Continue aspirin. Needs to resume statin.     Follow up   6 months   Neurology follow up     History of Present Illness    Gina Hart is a 46 y.o. female who is s/p deceased kidney transplant on 02-10-17. She has experienced no episodes of rejection with normal urinary protein and baseline creatinine <1.0 mg/dL.     She was diagnosed with COVID-19 on 08/11/20 with symptom onset 08/07/20. Symptoms included  fatigue, reduced appetite, loss of sense of smell, fever, cough, shortness of breath, and worsening cognition. She has experienced persistent fatigue and dyspnea on exertion subsequently with occasional vertigo. She has also had chronic nasal congestion. Memory deficits present prior to COVID worsened after COVID and have not improved.  She also has insomnia. She has not been taking Requip for restless legs as she feels it has not been helpful. Trazodone has also not been effective in treating the insomnia. She notes nasal congestion. She also has back pain and leg pain. She has not been taking gabapentin because they caused hallucinations.  She was seen by a Neurologist at [facility] with plans for neuropsychiatric testing proposed as a first step in analysis. No further work-up was suggested.     She also complains of weight gain and increased peripheral edema. She remains on amlodipine 10 mg daily and metoprolol for BP. Home BP has not been checked. She denies dysuria or allograft tenderness.      Immunosuppressive medication doses are unchanged (cyclosporine 125 mg bid, prednisone 5 mg daily). She has held Myfortic due to the EBV viremia history.     Transplant History:  1. 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  2. S/P kidney transplant at [facility] 02/01/2107. Deceased donor with KDPI 19%. CMV D+/R-; EBV D+/R+  3. Immunosuppression = campath induction followed by tacrolimus and myfortic maintenance therapy, steroid free regimen.   4. Tacrolimus changed to cyclosporine 07/31/2017 due to neurotoxicity with memory deficits and headaches as primary manifestation.  5. RSV infection 09/10/18  6. EBV viremia 05/2020 (VL 9010) with increased tonsillar uptake on PET CT scans 06/24/20 and 07/11/19. No evidence of PTLD.   7. COVID-19 infection 07/2020    Past Medical History:  1. ESRD secondary to FSGS.   2. Hypertension  3. Scoliosis with history of spinal fusion thoracic spine to L2-3  4. COVID-19 07/2020  5. S/P kidney transplant as stated above    Review of Systems    All other systems are reviewed and are negative.    Medications  Current Outpatient Medications   Medication Sig Dispense Refill   ??? acetaminophen (TYLENOL) 325 MG tablet Take 1-2 tablets (325-650 mg total) by mouth every four (4) hours as needed for pain. 100 tablet 0   ??? amLODIPine (NORVASC) 10 MG tablet Take 7.5 mg by mouth daily.     ??? aspirin (ECOTRIN) 81 MG tablet TAKE 1 TABLET (81 MG) BY MOUTH DAILY 30 tablet 0   ??? biotin 5 mg cap Take 1 capsule (5,000 mcg total) by mouth daily. 30 capsule 11   ??? cyclobenzaprine (FLEXERIL) 10 MG tablet Take 1 tablet (10 mg total) by mouth two (2) times a day as needed for muscle spasms. 45 tablet  0   ??? cycloSPORINE modified (NEORAL) 25 MG capsule Take 5 capsules (125 mg total) by mouth two (2) times a day. 300 capsule 11   ??? fluticasone propionate (FLONASE) 50 mcg/actuation nasal spray 1 spray by Each Nare route daily. 16 g 4   ??? gabapentin (NEURONTIN) 100 MG capsule Take 2 capsules (200 mg total) by mouth Three (3) times a day. 180 capsule 11   ??? loratadine (CLARITIN) 10 mg tablet Take 10 mg by mouth daily as needed for allergies.     ??? melatonin 5 mg tablet Take 1 tablet (5 mg total) by mouth nightly as needed. 30 tablet 11   ??? metoprolol tartrate (LOPRESSOR) 100 MG tablet Take 1 tablet (100 mg total) by mouth Two (2) times a day. 180 tablet 3   ??? predniSONE (DELTASONE) 5 MG tablet Take 1 tablet (5 mg total) by mouth daily. 30 tablet 11   ??? rOPINIRole (REQUIP) 0.25 MG tablet Take one tablet by mouth 1-3 hours before bedtime. 30 tablet 2   ??? traZODone (DESYREL) 50 MG tablet Take 1 tablet (50 mg total) by mouth nightly. 90 tablet 3   ??? WEGOVY 0.25 MG/0.5 ML SUBCUTANEOUS PEN INJECTOR Inject 0.25 mg under the skin every seven (7) days. 2 mL 11     No current facility-administered medications for this visit.       Physical Exam  BP 135/91 (BP Site: L Arm, BP Position: Sitting, BP Cuff Size: Large)  - Pulse 77  - Temp 36.4 ??C (97.6 ??F) (Temporal)  - Ht 149.9 cm (4' 11)  - Wt 98.8 kg (217 lb 12.8 oz)  - BMI 43.99 kg/m??   General: Patient is a pleasant female in no apparent distress.  Eyes: Sclera anicteric.  Neck: Supple without LAD/JVD/bruits.  Lungs: Clear to auscultation bilaterally, no wheezes/rales/rhonchi.  Cardiovascular: Regular rate and rhythm without murmurs, rubs or gallops.  Abdomen: Soft, notender/nondistended. Positive bowel sounds. No hepatosplenomegaly, masses or bruits appreciated.  Extremities: Without edema, joints without evidence of synovitis  Skin: Without rash  Neurological: Grossly nonfocal.  Psychiatric: Mood and affect appropriate.    Laboratory Results and Imaging Studies Reviewed in EMR present in the [facility] clinic during this visit. I was present in the [facility] clinic during this visit.     The patient was physically located in [place] or a state in which I am permitted to provide care. The patient and/or parent/gauardian understood that s/he may incur co-pays and cost sharing, and agreed to the telemedicine visit. The visit was completed via phone and/or video, which was appropriate and reasonable under the circumstances given the patient's presentation at the time.    The patient and/or parent/guardian has been advised of the potential risks and limitations of this mode of treatment (including, but not limited to, the absence of in-person examination) and has agreed to be treated using telemedicine. The patient's/patient's family's questions regarding telemedicine have been answered.     If the phone/video visit was completed in an ambulatory setting, the patient and/or parent/guardian has also been advised to contact their provider???s office for worsening conditions, and seek emergency medical treatment and/or call 911 if the patient deems either necessary.

## 2021-08-06 LAB — BK VIRUS QUANTITATIVE PCR, BLOOD: BK BLOOD RESULT: NOT DETECTED

## 2021-08-06 LAB — CMV DNA, QUANTITATIVE, PCR: CMV VIRAL LD: NOT DETECTED

## 2021-08-08 DIAGNOSIS — Z940 Kidney transplant status: Principal | ICD-10-CM

## 2021-08-08 MED ORDER — WEGOVY 0.25 MG/0.5 ML SUBCUTANEOUS PEN INJECTOR
SUBCUTANEOUS | 11 refills | 0.00000 days | Status: CP
Start: 2021-08-08 — End: 2021-08-08

## 2021-08-08 NOTE — Unmapped (Signed)
Spoke with patient regarding Wegovy.  Per patient, insurance has denied this medication for her and she is considering paying out of pocket if unable to get assistance with this drug.  She is interested in this for weight loss assistance.  Coordinator advised I will send script to [facility] shared services first to make sure there is no available assistance.

## 2021-08-09 LAB — VITAMIN D 25 HYDROXY: VITAMIN D, TOTAL (25OH): 22.6 ng/mL (ref 20.0–80.0)

## 2021-08-09 LAB — VITAMIN D 1,25 DIHYDROXY: VITAMIN D 1,25-DIHYDROXY: 13 pg/mL — ABNORMAL LOW

## 2021-08-09 NOTE — Unmapped (Signed)
Called patient.  Discussed with patient that insurance approval was denied for Ozempic for weight loss.  Per Dr Christena Deem, if patient would want to pay out of pocket for one of these medications, Trulicity would be the least costly option with good rx, $800 per month.  Coordinator advised patient to look into this to decide if she would like to explore this and if so which pharmacy she prefers.  Coordinator also advised patient a video visit could be set up with our dietician if she prefers.      Patient thanked coordinator for her time and plans at this time to explore more natural means of weight loss.

## 2021-08-11 DIAGNOSIS — Z940 Kidney transplant status: Principal | ICD-10-CM

## 2021-08-12 LAB — HLA DS POST TRANSPLANT
ANTI-DONOR HLA-A #1 MFI: 10 MFI
ANTI-DONOR HLA-A #2 MFI: 24 MFI
ANTI-DONOR HLA-B #1 MFI: 6 MFI
ANTI-DONOR HLA-B #2 MFI: 182 MFI
ANTI-DONOR HLA-C #1 MFI: 27 MFI
ANTI-DONOR HLA-C #2 MFI: 0 MFI

## 2021-08-12 LAB — FSAB CLASS 2 ANTIBODY SPECIFICITY: HLA CL2 AB RESULT: POSITIVE

## 2021-08-12 LAB — FSAB CLASS 1 ANTIBODY SPECIFICITY: HLA CLASS 1 ANTIBODY RESULT: POSITIVE

## 2021-08-15 DIAGNOSIS — Z940 Kidney transplant status: Principal | ICD-10-CM

## 2021-08-18 MED ORDER — ROPINIROLE 0.25 MG TABLET
ORAL_TABLET | ORAL | 0 refills | 0.00000 days | Status: CP
Start: 2021-08-18 — End: ?

## 2021-08-29 DIAGNOSIS — Z940 Kidney transplant status: Principal | ICD-10-CM

## 2021-08-29 MED ORDER — AMLODIPINE 2.5 MG TABLET
ORAL_TABLET | Freq: Every day | ORAL | 11 refills | 10.00000 days | Status: CP
Start: 2021-08-29 — End: 2022-08-29

## 2021-08-29 NOTE — Unmapped (Signed)
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  Prescription and letter sent.

## 2021-08-29 NOTE — Unmapped (Signed)
[facility] Refill Coordination Note    Specialty Medication(s) to be Shipped:   Transplant: cyclosporine 25mg     Other medication(s) to be shipped: No additional medications requested for fill at this time     Gina Hart, DOB: 1975/09/19  Phone: [phone] (home)       All above HIPAA information was verified with patient.     Was a Nurse, learning disability used for this call? No    Completed refill call assessment today to schedule patient's medication shipment from the [facility] [phone]).  All relevant notes have been reviewed.     Specialty medication(s) and dose(s) confirmed: Regimen is correct and unchanged.   Changes to medications: Gina Hart reports no changes at this time.  Changes to insurance: No  New side effects reported not previously addressed with a pharmacist or physician: None reported  Questions for the pharmacist: No    Confirmed patient received a Conservation officer, historic buildings and a Surveyor, mining with first shipment. The patient will receive a drug information handout for each medication shipped and additional FDA Medication Guides as required.       DISEASE/MEDICATION-SPECIFIC INFORMATION        N/A    SPECIALTY MEDICATION ADHERENCE     Medication Adherence    Patient reported X missed doses in the last month: 0  Specialty Medication: Cyclosporine 25mg   Patient is on additional specialty medications: No              Were doses missed due to medication being on hold? No    Cyclosporine 25 mg: 14 days of medicine on hand      REFERRAL TO PHARMACIST     Referral to the pharmacist: Not needed      [facility]     Shipping address confirmed in Epic.     Delivery Scheduled: Yes, Expected medication delivery date: 09/08/21.     Medication will be delivered via UPS to the prescription address in Epic WAM.    Tera Helper   [facility] Specialty Pharmacist

## 2021-08-30 NOTE — Unmapped (Unsigned)
Otolaryngology-Head and Neck Surgery Established Patient Note    Date of Service:  August 31, 2021    Assessment:  No diagnosis found.    46 y.o. female with outside PET imaging that showed hypermetabolic activity in thickened lingual tonsils. FFL showed enlarged, but benign appearing, lingual tonsils bilaterally .No obvious masses. ***    Plan:  ***  - Without outside PET imaging from 06/25/19 for my official review, we discussed a differential diagnosis of enlarged lingual tissue as posible inflammation. Will request the PET imaging here at [facility]  - Patient had congested nasal mucosa and enlarged IT on exam today. She does endorse mouth breathing and symptoms of nasal congestion.  Recommended that she use nasal sprays (flonase) QD for 1 month. Discussed daily and proper use of Flonase with her.  If symptoms persist, we discussed the possible need for a turbinate reduction in the future.   - Patient reports a prior history of sleep apnea. She does snore and does have episodes gasping for air. There is a question on physical exam whether or not the patient has had a UPPP in the past but the patient does not remember if shes had one or not. The patient is not currently using a CPAP machine and does not follow with anyone for her reported OSA. I recommended that she get a repeat sleep study and follow up with sleep medicine for her possible OSA.   - Follow up with me PRN  - The patient will call with any questions or concerns.     No chief complaint on file.      History of Present Illness:     Gina Hart is a 46 y.o. African-American pleasant female being seen in consultation at the request of Dr. Seymour Bars for evaluation and opinion of PET CT results from 06/25/19 which showed symmetric intense hypermetabolic activity in thickened lingual tonsils. Patient has a PMH of a right kidney transplant on 01/11/17 which is currently being managed by her nephrologist Dr. Margaretmary Bayley. Her last visit to nephrology on 06/05/19 she c/o nasal and sinus congestion persistent for 1 month prior. She further stated that she had no sense of taste or smell. COVID-19 test results were negative as of 06/10/19.  Dr. Margaretmary Bayley discussed abnormal lab findings regarding EBV viral load with the patient and planned for a PET CT which was performed on 06/25/19 at the medical mall at [facility]. Results showed symmetric intense hypermetabolic activity in thickened lingual tonsils.     Patient was last seen 07/09/19. ***    Today, the patient presents to clinic and endorses a sore throat with loss of hearing in her left ear that began a few days before her recent PET scan. This has since resolved. When she wakes in the morning, she notices a change in color of phlegm that she clears from her nose/throat. She mentioned that she as had a sleep study in the past which reported that she has sleep apnea but does not use a CPAP machine. Furthermore, she mentioned that she snores which wakes herself up sometimes. She had a negative COVID test but endorses loss of smell/taste. Denies fever, dysphagia. Denies smoking/alcohol usage.       The patient denies fevers, weight loss, SOB, pain, dysphagia, odynophagia, hoarseness, hemoptysis, otalgia, or neck masses/growths.      Past Medical History    Tonsillectomy as a teenager    Past Medical History:   Diagnosis Date   ??? ESRD (end stage renal  disease) (CMS-HCC)    ??? Focal segmental glomerulosclerosis 1998   ??? Hypertension    ??? Pulmonary hypertension (CMS-HCC)    ??? Scoliosis (and kyphoscoliosis), idiopathic          Past Surgical History    Past Surgical History:   Procedure Laterality Date   ??? BACK SURGERY     ??? CHG US GUIDE, VASCULAR ACCESS Right 07/11/2013    Procedure: ULTRASOUND GUIDANCE FOR VASC ACCESS REQUIRING [place] EVAL OF POTENTIAL ACCESS SITES;  Surgeon: Alben Deeds, MD;  Location: MAIN OR [facility];  Service: Vascular   ??? PR AV ANAST,FOREARM VEIN TRANSPOSITION Right 07/11/2013    Procedure: R. UE BRACHIOCEPHALIC FISTULA;  Surgeon: Alben Deeds, MD;  Location: MAIN OR [facility];  Service: Vascular   ??? PR AV ANAST,UP ARM BASILIC VEIN TRANSPOSIT Right 07/11/2013    Procedure: BASILIC VEIN TRANSPOSITION;  Surgeon: Alben Deeds, MD;  Location: MAIN [facility];  Service: Vascular   ??? PR RIGHT HEART CATH O2 SATURATION & CARDIAC OUTPUT N/A 06/16/2015    Procedure: Right Heart Catheterization;  Surgeon: Alvira Philips, MD;  Location: [facility];  Service: Cardiology   ??? PR TRANSPLANTATION OF KIDNEY Right 01/11/2017    Procedure: RENAL ALLOTRANSPLANTATION, IMPLANTATION OF GRAFT; WITHOUT RECIPIENT NEPHRECTOMY;  Surgeon: Leona Carry, MD;  Location: MAIN OR [facility];  Service: Transplant         Medications      Current Outpatient Medications   Medication Sig Dispense Refill   ??? acetaminophen (TYLENOL) 325 MG tablet Take 1-2 tablets (325-650 mg total) by mouth every four (4) hours as needed for pain. 100 tablet 0   ??? amLODIPine (NORVASC) 2.5 MG tablet Take 3 tablets (7.5 mg total) by mouth daily. 30 tablet 11   ??? aspirin (ECOTRIN) 81 MG tablet TAKE 1 TABLET (81 MG) BY MOUTH DAILY 30 tablet 0   ??? biotin 5 mg cap Take 1 capsule (5,000 mcg total) by mouth daily. 30 capsule 11   ??? cyclobenzaprine (FLEXERIL) 10 MG tablet Take 1 tablet (10 mg total) by mouth two (2) times a day as needed for muscle spasms. 45 tablet 0   ??? cycloSPORINE modified (NEORAL) 25 MG capsule Take 5 capsules (125 mg total) by mouth two (2) times a day. 300 capsule 11   ??? fluticasone propionate (FLONASE) 50 mcg/actuation nasal spray 1 spray by Each Nare route daily. 16 g 4   ??? gabapentin (NEURONTIN) 100 MG capsule Take 2 capsules (200 mg total) by mouth Three (3) times a day. 180 capsule 11   ??? loratadine (CLARITIN) 10 mg tablet Take 10 mg by mouth daily as needed for allergies.     ??? melatonin 5 mg tablet Take 1 tablet (5 mg total) by mouth nightly as needed. 30 tablet 11   ??? metoprolol tartrate (LOPRESSOR) 100 MG tablet Take 1 tablet (100 mg total) by mouth Two (2) times a day. 180 tablet 3   ??? predniSONE (DELTASONE) 5 MG tablet Take 1 tablet (5 mg total) by mouth daily. 30 tablet 11   ??? rOPINIRole (REQUIP) 0.25 MG tablet TAKE ONE TABLET BY MOUTH 1-3 HOURS BEFORE BEDTIME. 30 tablet 0   ??? traZODone (DESYREL) 50 MG tablet Take 1 tablet (50 mg total) by mouth nightly. 90 tablet 3   ??? WEGOVY 0.25 MG/0.5 ML SUBCUTANEOUS PEN INJECTOR Inject 0.25 mg under the skin every seven (7) days. 2 mL 11     No current facility-administered medications for this visit.  Allergies    Ibuprofen and Sulfa (sulfonamide antibiotics)      Social History:    Tobacco use:  reports that she has never smoked. She has never used smokeless tobacco.  Alcohol use:  reports no history of alcohol use.  Drug use:  reports no history of drug use.      Family History    The patient's family history includes Hypertension in her mother; No Known Problems in her daughter, father, maternal grandfather, maternal grandmother, paternal grandfather, paternal grandmother, and sister..      Review of Systems  A 10 system review of systems was negative except as noted in HPI and intake encounter form, which was reviewed and scanned into the media section of the medical record      Objective     Vital Signs  There were no vitals taken for this visit.      Physical Exam    Vitals signs reviewed in the nursing chart  General:  WD, WN, well groomed, pleasant female, sitting up in NAD, normal appearance and voice, communicates appropriately, no stridor.  Psychiatric/Neuro:  A&Ox3, Normal mood and affect, Following commands, MAE. Cranial nerves 2-12 intact, No focal deficits.   Head and Face:  AT, Young, no asymmetry, Skin with no masses or lesions, sinuses nontender to palpation.  Parotid and submandibular glands normal bilaterally.  Facial strength normal bilaterally.   Eyes:  EOM Intact, sclera anicteric, no conjunctival injection, PERRL.   Nose:  Normal external nasal pyramid, anterior rhinoscopy shows normal mucosa, septum, and turbinates.   Ears:  Normal auricles, EAC's, and TM's to otoscopy.  TM's intact and mobile, Middle ears aerated bilaterally.  Hearing:  Normal hearing to whispered voice.  Oral Cavity:   Normal lips, gingiva, tongue, hard palate, and dentition; pink, moist mucosa, no lesions, tongue is mobile.  Floor of mouth is soft.  Oropharynx:  Congestion of inferior turbinate L>R. Surgical absence of uvula and tonsils. No masses palpated. No lesions. BOT soft, soft palate and posterior pharynx, symmetric pink, moist mucosa  Neck:  No masses, no thyroid nodules, no thyromegaly, trachea midline. Normal laryngeal crepitus.  Lymphatics:  No cervical lymphadenopathy.  Respiratory:  Normal respiratory effort, symmetric chest rise, no accessory muscle usage.  Cardiovascular:  No clubbing, cyanosis, swelling, edema or varicosities in extremities, Regular Rate.      Laryngoscopy  Procedure Note    Pre-operative Diagnosis:   No diagnosis found.    Post-operative Diagnosis: same    Anesthesia: none    Surgeon: Epimenio Sarin. Lumley    Endoscopy Type:  Flexible Fiberoptic Laryngoscopy     Laryngoscope {ENTscope:62554} was used during this visit on August 31, 2021.     Indications: To better evaluate the patient???s tumor site    Procedure Details:    The patient was placed in the sitting position.  After topical anesthesia and decongestion, the 4 mm laryngoscope was passed.  The nasal cavities, nasopharynx, oropharynx, hypopharynx, and larynx were all examined.  Vocal cords were examined during respiration and phonation.  The following findings were noted:    Findings:  Nasal cavities:  Enlarged inferior turbinates L>R. Normal mucosa, patent, no masses, lesions, and septum. Congested nasal mucosa.  Nasopharynx:  Normal pink moist mucosa, normal eustachian tubes, no masses.  Oropharynx:  Symmericly enlarged lingual tonsils. Tonsils filling the vallecula and slightly causing a retroflexed epiglottis. No obvious masses. Normal palate, pharyngeal wall, and base of tongue, mucosa pink and moist  Hypopharynx:  Normal pharyngeal walls  and pyriform sinuses, no pooling of secretions  Larynx:  Supraglottis, false and true vocal cord are normal.  Vocal cord mobility is normal.  Subglottis is patent.    Condition:  Stable.  Patient tolerated procedure well.    Complications:  None            Test Results    none      Imaging    I have personally reviewed all pertinent imaging results.    06/25/19 PET CT Skull Base to Thigh:  IMPRESSION:  1. No hypermetabolic or enlarged lymph nodes skull base to thigh by FDG PET scan  2. Symmetric intense hypermetabolic in thickened lingual tonsils. Recommend clinical correlation  3. Normal spleen and marrow  4. Renal transplant in the right iliac fossa      Scribe's Attestation: ***

## 2021-08-31 ENCOUNTER — Ambulatory Visit: Admit: 2021-08-31 | Payer: MEDICARE

## 2021-09-05 DIAGNOSIS — Z940 Kidney transplant status: Principal | ICD-10-CM

## 2021-09-05 MED ORDER — AMLODIPINE 2.5 MG TABLET
ORAL_TABLET | Freq: Every day | ORAL | 11 refills | 30 days | Status: CP
Start: 2021-09-05 — End: 2022-09-05

## 2021-09-07 MED FILL — CYCLOSPORINE MODIFIED 25 MG CAPSULE: ORAL | 30 days supply | Qty: 300 | Fill #1

## 2021-09-12 DIAGNOSIS — G4700 Insomnia, unspecified: Principal | ICD-10-CM

## 2021-09-12 DIAGNOSIS — Z940 Kidney transplant status: Principal | ICD-10-CM

## 2021-09-12 MED ORDER — TRAZODONE 50 MG TABLET
ORAL_TABLET | Freq: Every evening | ORAL | 11 refills | 30 days | Status: CP
Start: 2021-09-12 — End: 2022-09-12

## 2021-09-12 NOTE — Unmapped (Signed)
Returned patient's call. Per patient, she is having trouble staying asleep.   She has been taking Melatonin, which she says doesn't help and Trazadone, which she says helped at first, but no longer helps.  She endorses falling asleep, but only being able to stay asleep around 2 hours per night and being awake all day.     Per Dr Christena Deem, patient can increase Trazadone to 100 mg at night.  Coordinator reviewed natural techniques to help patient rest better such as 20-30 minutes physical activity daily, avoid heavy meals and screen time right before bed, meditation and warm bath.  Patient verbalized understanding and thanked coordinator for time.    Patient will reach out in 1-2 weeks if no improvement.

## 2021-09-22 DIAGNOSIS — Z940 Kidney transplant status: Principal | ICD-10-CM

## 2021-09-22 MED ORDER — ZOLPIDEM 5 MG TABLET
ORAL_TABLET | Freq: Every evening | ORAL | 5 refills | 30 days | Status: CP | PRN
Start: 2021-09-22 — End: 2022-09-22

## 2021-09-22 NOTE — Unmapped (Signed)
Spoke with patient who states she is having trouble staying asleep and only sleeping 3-4 hours per night.  She is currently taking 100 mg Trazadone and Melatonin.  Per Dr Sherrilyn Rist, patient to begin 5 mg Ambien nightly and decrease Trazadone to 50 mg nightly x 1 week, then 25 mg nightly x 1 week then stop medication.  Patient verbalized understanding.    Medication called into [employer].

## 2021-09-26 DIAGNOSIS — Z940 Kidney transplant status: Principal | ICD-10-CM

## 2021-09-30 DIAGNOSIS — I10 Essential (primary) hypertension: Principal | ICD-10-CM

## 2021-09-30 DIAGNOSIS — Z940 Kidney transplant status: Principal | ICD-10-CM

## 2021-09-30 MED ORDER — METOPROLOL TARTRATE 100 MG TABLET
ORAL_TABLET | 3 refills | 0 days | Status: CP
Start: 2021-09-30 — End: ?

## 2021-09-30 NOTE — Unmapped (Signed)
[facility] Refill Coordination Note    Specialty Medication(s) to be Shipped:   Transplant: cyclosporine 25mg     Other medication(s) to be shipped: No additional medications requested for fill at this time     Gina Hart, DOB: May 08, 1976  Phone: [phone] (home)       All above HIPAA information was verified with patient.     Was a Nurse, learning disability used for this call? No    Completed refill call assessment today to schedule patient's medication shipment from the [facility] [phone]).  All relevant notes have been reviewed.     Specialty medication(s) and dose(s) confirmed: Regimen is correct and unchanged.   Changes to medications: Obera reports no changes at this time.  Changes to insurance: No  New side effects reported not previously addressed with a pharmacist or physician: None reported  Questions for the pharmacist: No    Confirmed patient received a Conservation officer, historic buildings and a Surveyor, mining with first shipment. The patient will receive a drug information handout for each medication shipped and additional FDA Medication Guides as required.       DISEASE/MEDICATION-SPECIFIC INFORMATION        N/A    SPECIALTY MEDICATION ADHERENCE     Medication Adherence    Patient reported X missed doses in the last month: 0  Specialty Medication: Cyclosporine 25mg   Patient is on additional specialty medications: No              Were doses missed due to medication being on hold? No    Cyclosporone Modified 25 mg: 11 days of medicine on hand        REFERRAL TO PHARMACIST     Referral to the pharmacist: Not needed      [facility]     Shipping address confirmed in Epic.     Delivery Scheduled: Yes, Expected medication delivery date: 10/06/21.     Medication will be delivered via UPS to the prescription address in Epic WAM.    Unk Lightning   [facility] Specialty Technician

## 2021-10-03 LAB — CBC W/ DIFFERENTIAL
BASOPHILS ABSOLUTE COUNT: 0 ul — ABNORMAL LOW (ref 0.01–0.09)
BASOPHILS RELATIVE PERCENT: 0.2 % (ref 0.0–2.0)
EOSINOPHILS ABSOLUTE COUNT: 0.1 (ref 0.04–0.5)
EOSINOPHILS RELATIVE PERCENT: 3.2 % (ref 1.0–8.0)
HEMATOCRIT: 41.8 (ref 35–48)
HEMOGLOBIN: 14.1 (ref 11.9–16.0)
LYMPHOCYTES ABSOLUTE COUNT: 1.1 (ref 1.0–2.7)
LYMPHOCYTES RELATIVE PERCENT: 28.4 % (ref 20–45)
MEAN CORPUSCULAR HEMOGLOBIN CONC: 33.8 (ref 33.3–35.5)
MEAN CORPUSCULAR HEMOGLOBIN: 30.2
MEAN CORPUSCULAR VOLUME: 89.4 fl (ref 78–95)
MEAN PLATELET VOLUME: 8.2 fl (ref 7.5–11.2)
MONOCYTES ABSOLUTE COUNT: 0.6 (ref 0.2–0.8)
MONOCYTES RELATIVE PERCENT: 14.3 % — ABNORMAL HIGH (ref 4.0–12.0)
NEUTROPHILS ABSOLUTE COUNT: 2.1 (ref 1.7–6.7)
NEUTROPHILS RELATIVE PERCENT: 53.9 % (ref 42–74)
PLATELET COUNT: 238 (ref 150–480)
RED BLOOD CELL COUNT: 4.7 (ref 4.0–5.3)
RED CELL DISTRIBUTION WIDTH: 12.7 % (ref 12.1–15.0)
WHITE BLOOD CELL COUNT: 3.9

## 2021-10-03 LAB — URINALYSIS WITH MICROSCOPY
BILIRUBIN UA: NEGATIVE
GLUCOSE UA: NEGATIVE
KETONES UA: NEGATIVE
LEUKOCYTE ESTERASE UA: NEGATIVE
NITRITE UA: NEGATIVE
PH UA: 5.5 (ref 4.5–8)
PROTEIN UA: NEGATIVE
SPECIFIC GRAVITY UA: 1.025 (ref 1.005–1.030)
UROBILINOGEN UA: 0.2

## 2021-10-03 LAB — BASIC METABOLIC PANEL
ANION GAP: 9.2 mmol/L (ref 2.0–15.0)
BLOOD UREA NITROGEN: 17 (ref 7–18)
BUN / CREAT RATIO: 17 — ABNORMAL HIGH (ref 10.00–16.00)
CALCIUM: 10 (ref 8.5–10.1)
CHLORIDE: 104 mmol/L (ref 100–108)
CO2: 27.8 mmol/L (ref 21–32)
CREATININE: 1 (ref 0.6–1.3)
EGFR CKD-EPI AA MALE: 72 — ABNORMAL LOW
GLUCOSE RANDOM: 90 (ref 65–99)
MAGNESIUM: 1.7 — ABNORMAL LOW (ref 1.8–2.4)
PHOSPHORUS: 3.7 (ref 2.6–4.7)
POTASSIUM: 4.4 mmol/L (ref 3.5–5.1)
SODIUM: 141 mmol/L (ref 136–145)

## 2021-10-03 NOTE — Unmapped (Signed)
Reached out to pt regarding concerns she has about starting zolpidem for insomnia.  Reviewed possible side effect of drowsiness and that it can cause daytime drowsiness for some individuals.  Recommended starting the medication on a night when she does not have early morning plans the next day.  Pt verbalized understanding and said that she'll start with a half dose (2.5).  Told her that was ok.

## 2021-10-03 NOTE — Unmapped (Signed)
Lab orders faxed.

## 2021-10-05 MED FILL — CYCLOSPORINE MODIFIED 25 MG CAPSULE: ORAL | 30 days supply | Qty: 300 | Fill #2

## 2021-10-10 DIAGNOSIS — Z940 Kidney transplant status: Principal | ICD-10-CM

## 2021-10-11 DIAGNOSIS — Z940 Kidney transplant status: Principal | ICD-10-CM

## 2021-10-11 LAB — CYCLOSPORINE, TROUGH: CYCLOSPORINE, TROUGH: 208

## 2021-10-11 MED ORDER — HYDROXYZINE HCL 25 MG TABLET
ORAL_TABLET | Freq: Every evening | ORAL | 3 refills | 30 days | Status: CP | PRN
Start: 2021-10-11 — End: ?

## 2021-10-11 NOTE — Unmapped (Signed)
Returned patient's call.  Patient states she is afraid to start taking Ambien for fear of risk of side effects and is currently not sleeping and experiencing terrible anxiety.  Per Dr Christena Deem, ok to take Hydroxyzine PRN nightly for sleep difficulty/anxiety.  Patient notified of this plan and agreeable.      Prescription sent to [facility].

## 2021-10-24 DIAGNOSIS — Z940 Kidney transplant status: Principal | ICD-10-CM

## 2021-10-26 NOTE — Unmapped (Signed)
[facility] Clinical Assessment & Refill Coordination Note    Gina Hart, DOB: 26-Jan-1976  Phone: [phone] (home)     All above HIPAA information was verified with patient.     Was a Nurse, learning disability used for this call? No    Specialty Medication(s):   Transplant: cyclosporine 25mg      Current Outpatient Medications   Medication Sig Dispense Refill    acetaminophen (TYLENOL) 325 MG tablet Take 1-2 tablets (325-650 mg total) by mouth every four (4) hours as needed for pain. 100 tablet 0    amLODIPine (NORVASC) 2.5 MG tablet Take 3 tablets (7.5 mg total) by mouth daily. 90 tablet 11    aspirin (ECOTRIN) 81 MG tablet TAKE 1 TABLET (81 MG) BY MOUTH DAILY 30 tablet 0    biotin 5 mg cap Take 1 capsule (5,000 mcg total) by mouth daily. 30 capsule 11    cyclobenzaprine (FLEXERIL) 10 MG tablet Take 1 tablet (10 mg total) by mouth two (2) times a day as needed for muscle spasms. 45 tablet 0    cycloSPORINE modified (NEORAL) 25 MG capsule Take 5 capsules (125 mg total) by mouth two (2) times a day. 300 capsule 11    fluticasone propionate (FLONASE) 50 mcg/actuation nasal spray 1 spray by Each Nare route daily. 16 g 4    gabapentin (NEURONTIN) 100 MG capsule Take 2 capsules (200 mg total) by mouth Three (3) times a day. 180 capsule 11    hydrOXYzine (ATARAX) 25 MG tablet Take 1 tablet (25 mg total) by mouth nightly as needed. 30 tablet 3    loratadine (CLARITIN) 10 mg tablet Take 10 mg by mouth daily as needed for allergies.      melatonin 5 mg tablet Take 1 tablet (5 mg total) by mouth nightly as needed. 30 tablet 11    metoprolol tartrate (LOPRESSOR) 100 MG tablet TAKE 1 TABLET BY MOUTH TWICE DAILY 180 tablet 3    predniSONE (DELTASONE) 5 MG tablet Take 1 tablet (5 mg total) by mouth daily. 30 tablet 11    rOPINIRole (REQUIP) 0.25 MG tablet TAKE ONE TABLET BY MOUTH 1-3 HOURS BEFORE BEDTIME. 30 tablet 0    traZODone (DESYREL) 50 MG tablet Take 2 tablets (100 mg total) by mouth nightly. 60 tablet 11    WEGOVY 0.25 MG/0.5 ML SUBCUTANEOUS PEN INJECTOR Inject 0.25 mg under the skin every seven (7) days. 2 mL 11    zolpidem (AMBIEN) 5 MG tablet Take 1 tablet (5 mg total) by mouth nightly as needed for sleep. 30 tablet 5     No current facility-administered medications for this visit.        Changes to medications: Jalyric reports no changes at this time.    Allergies   Allergen Reactions    Ibuprofen Swelling     Other reaction(s): SWELLING/EDEMA    Sulfa (Sulfonamide Antibiotics)        Changes to allergies: No    SPECIALTY MEDICATION ADHERENCE     Cyclosporine 25 mg: 15 days of medicine on hand     Medication Adherence    Patient reported X missed doses in the last month: 0  Specialty Medication: Cyclosporine 25mg   Patient is on additional specialty medications: No          Specialty medication(s) dose(s) confirmed: Regimen is correct and unchanged.     Are there any concerns with adherence? No    Adherence counseling provided? Not needed    CLINICAL MANAGEMENT  AND INTERVENTION      Clinical Benefit Assessment:    Do you feel the medicine is effective or helping your condition? Yes    Clinical Benefit counseling provided? Not needed    Adverse Effects Assessment:    Are you experiencing any side effects? No    Are you experiencing difficulty administering your medicine? No    Quality of Life Assessment:           How many days over the past month did your kidney transplant  keep you from your normal activities? For example, brushing your teeth or getting up in the morning. 0    Have you discussed this with your provider? Not needed    Acute Infection Status:    Acute infections noted within Epic:  No active infections  Patient reported infection: None    Therapy Appropriateness:    Is therapy appropriate and patient progressing towards therapeutic goals? Yes, therapy is appropriate and should be continued    DISEASE/MEDICATION-SPECIFIC INFORMATION      N/A    PATIENT SPECIFIC NEEDS     Does the patient have any physical, cognitive, or cultural barriers? No    Is the patient high risk? No    Does the patient require a Care Management Plan? No       SHIPPING     Specialty Medication(s) to be Shipped:   Transplant: None    Other medication(s) to be shipped: No additional medications requested for fill at this time     Changes to insurance: No    Delivery Scheduled: Patient declined refill at this time due to has more than 2 weeks on hand..     Medication will be delivered via UPS to the confirmed prescription address in [facility].    The patient will receive a drug information handout for each medication shipped and additional FDA Medication Guides as required.  Verified that patient has previously received a Conservation officer, historic buildings and a Surveyor, mining.    The patient or caregiver noted above participated in the development of this care plan and knows that they can request review of or adjustments to the care plan at any time.      All of the patient's questions and concerns have been addressed.    Tera Helper   [facility] Specialty Pharmacist

## 2021-11-01 NOTE — Unmapped (Signed)
[facility] Refill Coordination Note    Specialty Medication(s) to be Shipped:   Transplant: cyclosporine 25mg     Other medication(s) to be shipped: No additional medications requested for fill at this time     Gina Hart, DOB: June 30, 1975  Phone: [phone] (home)       All above HIPAA information was verified with patient.     Was a Nurse, learning disability used for this call? No    Completed refill call assessment today to schedule patient's medication shipment from the [facility] [phone]).  All relevant notes have been reviewed.     Specialty medication(s) and dose(s) confirmed: Regimen is correct and unchanged.   Changes to medications: Gina Hart reports no changes at this time.  Changes to insurance: No  New side effects reported not previously addressed with a pharmacist or physician: None reported  Questions for the pharmacist: No    Confirmed patient received a Conservation officer, historic buildings and a Surveyor, mining with first shipment. The patient will receive a drug information handout for each medication shipped and additional FDA Medication Guides as required.       DISEASE/MEDICATION-SPECIFIC INFORMATION        N/A    SPECIALTY MEDICATION ADHERENCE     Medication Adherence    Patient reported X missed doses in the last month: 0  Specialty Medication: Cyclosporine 25mg   Patient is on additional specialty medications: No              Were doses missed due to medication being on hold? No    Cyclosporine 25 mg: 12 days of medicine on hand     REFERRAL TO PHARMACIST     Referral to the pharmacist: Not needed      [facility]     Shipping address confirmed in Epic.     Delivery Scheduled: Yes, Expected medication delivery date: 11/10/21.     Medication will be delivered via UPS to the prescription address in Epic WAM.    Tera Helper   [facility] Specialty Pharmacist

## 2021-11-07 DIAGNOSIS — Z940 Kidney transplant status: Principal | ICD-10-CM

## 2021-11-07 NOTE — Unmapped (Unsigned)
Otolaryngology-Head and Neck Surgery Established Patient Note    Date of Service:  Nov 09, 2021      Assessment:  No diagnosis found.      46 y.o. female with outside PET imaging that showed hypermetabolic activity in thickened lingual tonsils. Imaging was not available for my review. I have requested for imaging to be sent here at [facility] for a formal radiological read. FFL showed enlarged, but benign appearing, lingual tonsils bilaterally.No obvious masses. ***    Plan:  ***  - Without outside PET imaging from 06/25/19 for my official review, we discussed a differential diagnosis of enlarged lingual tissue as posible inflammation. Will request the PET imaging here at [facility]  - Patient had congested nasal mucosa and enlarged IT on exam today. She does endorse mouth breathing and symptoms of nasal congestion.  Recommended that she use nasal sprays (flonase) QD for 1 month. Discussed daily and proper use of Flonase with her.  If symptoms persist, we discussed the possible need for a turbinate reduction in the future.   - Patient reports a prior history of sleep apnea. She does snore and does have episodes gasping for air. There is a question on physical exam whether or not the patient has had a UPPP in the past but the patient does not remember if shes had one or not. The patient is not currently using a CPAP machine and does not follow with anyone for her reported OSA. I recommended that she get a repeat sleep study and follow up with sleep medicine for her possible OSA.   - Follow up with me PRN  - The patient will call with any questions or concerns.     No chief complaint on file.      History of Present Illness:     Gina Hart is a 46 y.o. African-American pleasant female being seen in consultation at the request of Dr. Seymour Bars for evaluation and opinion of PET CT results from 06/25/19 which showed symmetric intense hypermetabolic activity in thickened lingual tonsils. Patient has a PMH of a right kidney transplant on 01/11/17 which is currently being managed by her nephrologist Dr. Margaretmary Bayley. Her last visit to nephrology on 06/05/19 she c/o nasal and sinus congestion persistent for 1 month prior. She further stated that she had no sense of taste or smell. COVID-19 test results were negative as of 06/10/19.  Dr. Margaretmary Bayley discussed abnormal lab findings regarding EBV viral load with the patient and planned for a PET CT which was performed on 06/25/19 at the medical mall at [facility]. Results showed symmetric intense hypermetabolic activity in thickened lingual tonsils.     ***  Today, the patient presents to clinic and endorses a sore throat with loss of hearing in her left ear that began a few days before her recent PET scan. This has since resolved. When she wakes in the morning, she notices a change in color of phlegm that she clears from her nose/throat. She mentioned that she as had a sleep study in the past which reported that she has sleep apnea but does not use a CPAP machine. Furthermore, she mentioned that she snores which wakes herself up sometimes. She had a negative COVID test but endorses loss of smell/taste. Denies fever, dysphagia. Denies smoking/alcohol usage.       The patient denies fevers, weight loss, SOB, pain, dysphagia, odynophagia, hoarseness, hemoptysis, otalgia, or neck masses/growths.      Past Medical History    Tonsillectomy as  a teenager    Past Medical History:   Diagnosis Date    ESRD (end stage renal disease) (CMS-HCC)     Focal segmental glomerulosclerosis 1998    Hypertension     Pulmonary hypertension (CMS-HCC)     Scoliosis (and kyphoscoliosis), idiopathic          Past Surgical History    Past Surgical History:   Procedure Laterality Date    BACK SURGERY      CHG US GUIDE, VASCULAR ACCESS Right 07/11/2013    Procedure: ULTRASOUND GUIDANCE FOR VASC ACCESS REQUIRING [place] EVAL OF POTENTIAL ACCESS SITES;  Surgeon: Alben Deeds, MD;  Location: MAIN OR [facility];  Service: Vascular    PR AV ANAST,FOREARM VEIN TRANSPOSITION Right 07/11/2013    Procedure: R. UE BRACHIOCEPHALIC FISTULA;  Surgeon: Alben Deeds, MD;  Location: MAIN OR [facility];  Service: Vascular    PR AV ANAST,UP ARM BASILIC VEIN TRANSPOSIT Right 07/11/2013    Procedure: BASILIC VEIN TRANSPOSITION;  Surgeon: Alben Deeds, MD;  Location: MAIN OR [facility];  Service: Vascular    PR RIGHT HEART CATH O2 SATURATION & CARDIAC OUTPUT N/A 06/16/2015    Procedure: Right Heart Catheterization;  Surgeon: Alvira Philips, MD;  Location: [facility];  Service: Cardiology    PR TRANSPLANTATION OF KIDNEY Right 01/11/2017    Procedure: RENAL ALLOTRANSPLANTATION, IMPLANTATION OF GRAFT; WITHOUT RECIPIENT NEPHRECTOMY;  Surgeon: Leona Carry, MD;  Location: MAIN OR [facility];  Service: Transplant         Medications      Current Outpatient Medications   Medication Sig Dispense Refill    acetaminophen (TYLENOL) 325 MG tablet Take 1-2 tablets (325-650 mg total) by mouth every four (4) hours as needed for pain. 100 tablet 0    amLODIPine (NORVASC) 2.5 MG tablet Take 3 tablets (7.5 mg total) by mouth daily. 90 tablet 11    aspirin (ECOTRIN) 81 MG tablet TAKE 1 TABLET (81 MG) BY MOUTH DAILY 30 tablet 0    biotin 5 mg cap Take 1 capsule (5,000 mcg total) by mouth daily. 30 capsule 11    cyclobenzaprine (FLEXERIL) 10 MG tablet Take 1 tablet (10 mg total) by mouth two (2) times a day as needed for muscle spasms. 45 tablet 0    cycloSPORINE modified (NEORAL) 25 MG capsule Take 5 capsules (125 mg total) by mouth two (2) times a day. 300 capsule 11    fluticasone propionate (FLONASE) 50 mcg/actuation nasal spray 1 spray by Each Nare route daily. 16 g 4    gabapentin (NEURONTIN) 100 MG capsule Take 2 capsules (200 mg total) by mouth Three (3) times a day. 180 capsule 11    hydrOXYzine (ATARAX) 25 MG tablet Take 1 tablet (25 mg total) by mouth nightly as needed. 30 tablet 3    loratadine (CLARITIN) 10 mg tablet Take 10 mg by mouth daily as needed for allergies. melatonin 5 mg tablet Take 1 tablet (5 mg total) by mouth nightly as needed. 30 tablet 11    metoprolol tartrate (LOPRESSOR) 100 MG tablet TAKE 1 TABLET BY MOUTH TWICE DAILY 180 tablet 3    predniSONE (DELTASONE) 5 MG tablet Take 1 tablet (5 mg total) by mouth daily. 30 tablet 11    rOPINIRole (REQUIP) 0.25 MG tablet TAKE ONE TABLET BY MOUTH 1-3 HOURS BEFORE BEDTIME. 30 tablet 0    traZODone (DESYREL) 50 MG tablet Take 2 tablets (100 mg total) by mouth nightly. 60 tablet 11    WEGOVY 0.25  MG/0.5 ML SUBCUTANEOUS PEN INJECTOR Inject 0.25 mg under the skin every seven (7) days. 2 mL 11    zolpidem (AMBIEN) 5 MG tablet Take 1 tablet (5 mg total) by mouth nightly as needed for sleep. 30 tablet 5     No current facility-administered medications for this visit.         Allergies    Ibuprofen and Sulfa (sulfonamide antibiotics)      Social History:    Tobacco use:  reports that she has never smoked. She has never used smokeless tobacco.  Alcohol use:  reports no history of alcohol use.  Drug use:  reports no history of drug use.      Family History    The patient's family history includes Hypertension in her mother; No Known Problems in her daughter, father, maternal grandfather, maternal grandmother, paternal grandfather, paternal grandmother, and sister..      Review of Systems  A 10 system review of systems was negative except as noted in HPI and intake encounter form, which was reviewed and scanned into the media section of the medical record      Objective     Vital Signs  There were no vitals taken for this visit.      Physical Exam    Vitals signs reviewed in the nursing chart  General:  WD, WN, well groomed, pleasant female, sitting up in NAD, normal appearance and voice, communicates appropriately, no stridor.  Psychiatric/Neuro:  A&Ox3, Normal mood and affect, Following commands, MAE. Cranial nerves 2-12 intact, No focal deficits.   Head and Face:  AT, Dickson, no asymmetry, Skin with no masses or lesions, sinuses nontender to palpation.  Parotid and submandibular glands normal bilaterally.  Facial strength normal bilaterally.   Eyes:  EOM Intact, sclera anicteric, no conjunctival injection, PERRL.   Nose:  Normal external nasal pyramid, anterior rhinoscopy shows normal  mucosa, septum, and turbinates.   Ears:  Normal auricles, EAC's, and TM's to otoscopy.  TM's intact and mobile, Middle ears aerated bilaterally.  Hearing:  Normal hearing to whispered voice.  Oral Cavity:   Normal lips, gingiva, tongue, hard palate, and dentition; pink, moist mucosa, no lesions, tongue is mobile.  Floor of mouth is soft.  Oropharynx:  Congestion of inferior turbinate L>R. Surgical absence of uvula and tonsils. No masses palpated. No lesions. BOT soft, soft palate and posterior pharynx, symmetric pink, moist mucosa  Neck:  No masses, no thyroid nodules, no thyromegaly, trachea midline. Normal laryngeal crepitus.  Lymphatics:  No cervical lymphadenopathy.  Respiratory:  Normal respiratory effort, symmetric chest rise, no accessory muscle usage.  Cardiovascular:  No clubbing, cyanosis, swelling, edema or varicosities in extremities, Regular Rate.      Laryngoscopy  Procedure Note    Pre-operative Diagnosis:   No diagnosis found.      Post-operative Diagnosis: same    Anesthesia: none    Surgeon: Epimenio Sarin. Lumley    Endoscopy Type:  Flexible Fiberoptic Laryngoscopy    Laryngoscope {ENTscope:62554} was used during this visit on Nov 07, 2021.     Indications: To better evaluate the patient???s tumor site    Procedure Details:    The patient was placed in the sitting position.  After topical anesthesia and decongestion, the 4 mm laryngoscope was passed.  The nasal cavities, nasopharynx, oropharynx, hypopharynx, and larynx were all examined.  Vocal cords were examined during respiration and phonation.  The following findings were noted:    Findings:  Nasal cavities:  Enlarged inferior  turbinates L>R. Normal mucosa, patent, no masses, lesions, and septum. Congested nasal mucosa.  Nasopharynx:  Normal pink moist mucosa, normal eustachian tubes, no masses.  Oropharynx:  Symmericly enlarged lingual tonsils. Tonsils filling the vallecula and slightly causing a retroflexed epiglottis. No obvious masses. Normal palate, pharyngeal wall, and base of tongue, mucosa pink and moist  Hypopharynx:  Normal pharyngeal walls and pyriform sinuses, no pooling of secretions  Larynx:  Supraglottis, false and true vocal cord are normal.  Vocal cord mobility is normal.  Subglottis is patent.    Condition:  Stable.  Patient tolerated procedure well.    Complications:  None      Test Results    none      Imaging    I have personally reviewed all pertinent imaging results.    06/25/19 PET CT Skull Base to Thigh:  IMPRESSION:  1. No hypermetabolic or enlarged lymph nodes skull base to thigh by FDG PET scan  2. Symmetric intense hypermetabolic in thickened lingual tonsils. Recommend clinical correlation  3. Normal spleen and marrow  4. Renal transplant in the right iliac fossa      Scribe's Attestation:  ***

## 2021-11-09 ENCOUNTER — Ambulatory Visit: Admit: 2021-11-09 | Payer: MEDICARE

## 2021-11-09 MED FILL — CYCLOSPORINE MODIFIED 25 MG CAPSULE: ORAL | 30 days supply | Qty: 300 | Fill #3

## 2021-11-16 LAB — CBC W/ DIFFERENTIAL
BASOPHILS ABSOLUTE COUNT: 0 ul
BASOPHILS RELATIVE PERCENT: 0.7 % (ref 0.0–2.0)
EOSINOPHILS ABSOLUTE COUNT: 0.2 (ref 0.04–0.5)
EOSINOPHILS RELATIVE PERCENT: 4.3 % (ref 1.0–8.0)
HEMATOCRIT: 38.8 (ref 35–48)
HEMOGLOBIN: 13.3 (ref 11.9–16.0)
LYMPHOCYTES ABSOLUTE COUNT: 1.1 (ref 1.0–2.7)
LYMPHOCYTES RELATIVE PERCENT: 26.5 % (ref 20–45)
MEAN CORPUSCULAR HEMOGLOBIN CONC: 34.1 (ref 33.3–35.5)
MEAN CORPUSCULAR HEMOGLOBIN: 30.4 pg (ref 25.8–33.0)
MEAN CORPUSCULAR VOLUME: 89 fl (ref 78–95)
MEAN PLATELET VOLUME: 8 fl (ref 7.5–11.2)
MONOCYTES ABSOLUTE COUNT: 0.5 (ref 0.2–0.8)
MONOCYTES RELATIVE PERCENT: 12.7 % — ABNORMAL HIGH (ref 4.0–12.0)
NEUTROPHILS ABSOLUTE COUNT: 2.3 (ref 1.7–6.7)
NEUTROPHILS RELATIVE PERCENT: 55.8 % (ref 42–74)
PLATELET COUNT: 220
RED BLOOD CELL COUNT: 4.4 (ref 4.0–5.3)
RED CELL DISTRIBUTION WIDTH: 13 % (ref 12.1–15.0)
WHITE BLOOD CELL COUNT: 4.2 (ref 4.0–10.5)

## 2021-11-16 LAB — URINALYSIS WITH MICROSCOPY WITH CULTURE REFLEX
BILIRUBIN UA: NEGATIVE
BLOOD UA: NEGATIVE
GLUCOSE UA: NEGATIVE
KETONES UA: NEGATIVE
LEUKOCYTE ESTERASE UA: NEGATIVE
NITRITE UA: NEGATIVE
PH UA: 6 (ref 4.5–8)
PROTEIN UA: NEGATIVE
SPECIFIC GRAVITY UA: 1.015 (ref 1.005–1.030)
UROBILINOGEN UA: 0.2

## 2021-11-16 LAB — BASIC METABOLIC PANEL
ANION GAP: 6.1 mmol/L (ref 2.0–15.0)
BLOOD UREA NITROGEN: 17 (ref 7–18)
BUN / CREAT RATIO: 21.3 — ABNORMAL HIGH (ref 10.00–16.00)
CALCIUM: 9.1 (ref 8.5–10.1)
CHLORIDE: 104 mmol/L (ref 100–108)
CO2: 26.9 mmol/L (ref 21–32)
CREATININE: 0.8 (ref 0.6–1.3)
EGFR CKD-EPI AA FEMALE: 91 mL/min
GLUCOSE RANDOM: 93 (ref 65–99)
MAGNESIUM: 1.5 — ABNORMAL LOW (ref 1.8–2.4)
OSMOLALITY CALCULATION: 285 — ABNORMAL LOW (ref 289–308)
PHOSPHORUS: 3 (ref 2.6–4.7)
POTASSIUM: 3.9 mmol/L (ref 3.5–5.1)
SODIUM: 137 mmol/L (ref 136–145)

## 2021-11-21 DIAGNOSIS — Z940 Kidney transplant status: Principal | ICD-10-CM

## 2021-11-23 NOTE — Unmapped (Signed)
Attempted to call patient regarding positive urine culture.  No answer and unable to leave voicemail due to full mailbox.

## 2021-11-24 DIAGNOSIS — Z940 Kidney transplant status: Principal | ICD-10-CM

## 2021-11-24 MED ORDER — AMOXICILLIN 500 MG CAPSULE
ORAL_CAPSULE | Freq: Three times a day (TID) | ORAL | 0 refills | 7 days | Status: CP
Start: 2021-11-24 — End: 2021-12-01

## 2021-11-24 NOTE — Unmapped (Signed)
Called patient and left voicemail yesterday regarding positive urine culture.  Received return call from patient who states she is noting some urinary frequency and an odor to her urine.  Spoke with Dr Margaretmary Bayley who recommended to start Amoxicillin 500 mg TID x 7 days.  Patient notified and prescription sent to pharmacy.

## 2021-11-28 MED ORDER — FLUCONAZOLE 150 MG TABLET
ORAL_TABLET | Freq: Once | ORAL | 0 refills | 1 days | Status: CP
Start: 2021-11-28 — End: 2021-11-28

## 2021-11-28 NOTE — Unmapped (Signed)
Pt paged on call TNC. Reports she was given an antibiotic recently and when on abx she usually develops a yeast infection. Forgot to ask for the Diflucan she is normally prescribed when this occurs. Reviewed pt chart and confirmed this protocol. Diflucan sent to [facility] per pt request.

## 2021-12-05 DIAGNOSIS — Z940 Kidney transplant status: Principal | ICD-10-CM

## 2021-12-12 NOTE — Unmapped (Signed)
[facility] Refill Coordination Note    Specialty Medication(s) to be Shipped:   Transplant: cyclosporine 25mg     Other medication(s) to be shipped: No additional medications requested for fill at this time     Gina Hart, DOB: 04-23-1976  Phone: [phone] (home)       All above HIPAA information was verified with patient.     Was a Nurse, learning disability used for this call? No    Completed refill call assessment today to schedule patient's medication shipment from the [facility] [phone]).  All relevant notes have been reviewed.     Specialty medication(s) and dose(s) confirmed: Regimen is correct and unchanged.   Changes to medications: Gina Hart reports no changes at this time.  Changes to insurance: No  New side effects reported not previously addressed with a pharmacist or physician: None reported  Questions for the pharmacist: No    Confirmed patient received a Conservation officer, historic buildings and a Surveyor, mining with first shipment. The patient will receive a drug information handout for each medication shipped and additional FDA Medication Guides as required.       DISEASE/MEDICATION-SPECIFIC INFORMATION        N/A    SPECIALTY MEDICATION ADHERENCE     Medication Adherence    Patient reported X missed doses in the last month: 0  Specialty Medication: cycloSPORINE modified 25 MG  Patient is on additional specialty medications: No  Patient is on more than two specialty medications: No  Any gaps in refill history greater than 2 weeks in the last 3 months: no  Demonstrates understanding of importance of adherence: yes              Were doses missed due to medication being on hold? No    Cyclosporone Modified 25 mg: 6 days of medicine on hand        REFERRAL TO PHARMACIST     Referral to the pharmacist: Not needed      [place]     Shipping address confirmed in Epic.     Delivery Scheduled: Yes, Expected medication delivery date: 12/14/21.     Medication will be delivered via UPS to the prescription address in Epic WAM.    Gina Hart   [facility] Specialty Technician

## 2021-12-13 MED FILL — CYCLOSPORINE MODIFIED 25 MG CAPSULE: ORAL | 30 days supply | Qty: 300 | Fill #4

## 2021-12-19 DIAGNOSIS — Z940 Kidney transplant status: Principal | ICD-10-CM

## 2021-12-28 NOTE — Unmapped (Signed)
Called patient to notify that lab orders have been sent via my chart and faxed.  Message sent to schedulers for video visit with Dr Margaretmary Bayley per patient's request.

## 2021-12-28 NOTE — Unmapped (Signed)
Error

## 2022-01-02 DIAGNOSIS — Z940 Kidney transplant status: Principal | ICD-10-CM

## 2022-01-05 DIAGNOSIS — Z940 Kidney transplant status: Principal | ICD-10-CM

## 2022-01-05 DIAGNOSIS — I10 Essential (primary) hypertension: Principal | ICD-10-CM

## 2022-01-05 MED ORDER — CIPROFLOXACIN 100 MG TABLET
ORAL_TABLET | Freq: Two times a day (BID) | ORAL | 0 refills | 10 days | Status: CP
Start: 2022-01-05 — End: 2022-01-15

## 2022-01-05 MED ORDER — METOPROLOL TARTRATE 100 MG TABLET
ORAL_TABLET | Freq: Two times a day (BID) | ORAL | 0 refills | 90 days | Status: CP
Start: 2022-01-05 — End: 2022-04-05

## 2022-01-05 NOTE — Unmapped (Signed)
Called and spoke with patient regarding positive urine culture.  Patient states she has noticed some frequency over the last couple of days, but attributed to the amount of water she was drinking.  Per Dr Margaretmary Bayley, patient to begin 100 mg Ciprofloxacin BID x 10 days.  Patient verbalized understanding and will pick up at local pharmacy today.

## 2022-01-06 NOTE — Unmapped (Signed)
[facility] Pharm[facility] Refill Coordination Note    Specialty Medication(s) to be Shipped:   Transplant: cyclosporine 25mg     Other medication(s) to be shipped: No additional medications requested for fill at this time     Gina Hart, DOB: 1975-12-18  Phone: [phone] (home)       All above HIPAA information was verified with patient.     Was a Nurse, learning disability used for this call? No    Completed refill call assessment today to schedule patient's medication shipment from the [facility] [phone]).  All relevant notes have been reviewed.     Specialty medication(s) and dose(s) confirmed: Regimen is correct and unchanged.   Changes to medications: Gina Hart reports no changes at this time.  Changes to insurance: No  New side effects reported not previously addressed with a pharmacist or physician: None reported  Questions for the pharmacist: No    Confirmed patient received a Conservation officer, historic buildings and a Surveyor, mining with first shipment. The patient will receive a drug information handout for each medication shipped and additional FDA Medication Guides as required.       DISEASE/MEDICATION-SPECIFIC INFORMATION        N/A    SPECIALTY MEDICATION ADHERENCE     Medication Adherence    Patient reported X missed doses in the last month: 0  Specialty Medication: Cyclosporine 25mg   Patient is on additional specialty medications: No  Patient is on more than two specialty medications: No              Were doses missed due to medication being on hold? No    Cyclosporine 25 mg: 6 days of medicine on hand     REFERRAL TO PHARMACIST     Referral to the pharmacist: Not needed      [facility]     Shipping address confirmed in Epic.     Delivery Scheduled: Yes, Expected medication delivery date: 01/10/22.     Medication will be delivered via UPS to the prescription address in Epic WAM.    Gina Hart [facility] Specialty Technician

## 2022-01-09 MED FILL — CYCLOSPORINE MODIFIED 25 MG CAPSULE: ORAL | 30 days supply | Qty: 300 | Fill #5

## 2022-01-16 DIAGNOSIS — Z940 Kidney transplant status: Principal | ICD-10-CM

## 2022-01-16 MED ORDER — FLUCONAZOLE 150 MG TABLET
ORAL_TABLET | Freq: Every day | ORAL | 0 refills | 3 days | Status: CP
Start: 2022-01-16 — End: 2022-01-19

## 2022-01-16 NOTE — Unmapped (Signed)
Received message from patient that she has completed her course of antibiotics for a UTI.  She notes that she now has a yeast infection.  Per Dr Margaretmary Bayley, patient to take Fluconazole 150 mg x 3 days.  Prescription sent to [facility] and patient notified.

## 2022-01-25 DIAGNOSIS — Z940 Kidney transplant status: Principal | ICD-10-CM

## 2022-01-25 MED ORDER — PREDNISONE 5 MG TABLET
ORAL_TABLET | Freq: Every day | ORAL | 11 refills | 30 days | Status: CP
Start: 2022-01-25 — End: 2023-01-25

## 2022-01-26 NOTE — Unmapped (Signed)
error 

## 2022-01-30 DIAGNOSIS — Z940 Kidney transplant status: Principal | ICD-10-CM

## 2022-01-31 DIAGNOSIS — R82998 Other abnormal findings in urine: Principal | ICD-10-CM

## 2022-01-31 DIAGNOSIS — Z940 Kidney transplant status: Principal | ICD-10-CM

## 2022-01-31 DIAGNOSIS — R7989 Other specified abnormal findings of blood chemistry: Principal | ICD-10-CM

## 2022-02-02 ENCOUNTER — Telehealth: Admit: 2022-02-02 | Discharge: 2022-02-03 | Payer: MEDICARE | Attending: Nephrology | Primary: Nephrology

## 2022-02-02 NOTE — Unmapped (Unsigned)
Transplant Nephrology Clinic Visit       Assessment and Plan  Gina Hart is a 46 y.o. female who is s/p deceased donor kidney transplant on 01/11/17. She is evaluated today by video visit for follow up of her kidney transplant, immunosuppression and associated medical problems. Issues addressed today include:    Status post deceased donor kidney transplant 01/11/17 with no history of rejection or evidence of FSGS recurrence.   - Serum creatinine is stable at 0.80 on 01/31/22 (baseline <1.0 mg/dL).   - UP/C has been historically normal (0.153 on 08/04/21). UA on 01/31/22 without hematuria, pyuria, or proteinuria.    - DSA screen was negative on 08/04/21 though class II could not be checked due to an interfering substance.    Immunosuppression management.   - Cyclosporine level 209 on 01/31/22 (target trough 125-225)  - Will continue cyclosporine 125 mg BID and prednisone 5 mg daily.   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   - PET CT 06/25/19 done at [facility] revealed symmetric hypermetabolic activity in the lingual tonsils without evidence of lymphadenopathy elsewhere. ENT evaluation on 07/09/19 revealed enlarged tonsils, enlarged turbinates, and congested nasal mucosa. Repeat PET CT at [facility] on 07/09/19 revealed only slightly increased uptake in the lingual tonsils without evidence of malignancy.  - EBV viral load has been gradually declining after Myfortic was held with most recent value of <35 on 05/23/21.  - Will continue to follow periodic EBV viral loads.     Hypertension.    - Home BP unkown  - Edema persists on amlodipine   - Continue metoprolol tartrate 100mg  bid and amlodipine 10 mg daily.      Morganella UTI, 01/05/22  - Treated with 10 days of Cipro then a 3 day course of fluconazole for post-antibiotics yeast infection  - Symptoms now resolved.    History of COVID-19, onset 08/07/20, diagnosed 08/11/20, with long-COVID symptoms   - Had received 2 doses of COVID vaccine prior to illness  - Symptoms included fatigue, reduced appetite, loss of sense of smell, fever, cough, shortness of breath  - No O2 requirement  - Sotrovimab administered 08/12/20   - Though most symptoms resolved she has experienced insomnia, dyspnea on exertion, fatigue, brain fog, and occasional vertigo subsequetly    Back pain, neck pain, left leg pain with history of spinal fusion and cervical spinal stenosis   - She is s/p spinal fusion of thoracic spine through L2-3  - She needs spine clinic appointment  - No longer on gabapentin due to side effects    Hyperlipidemia management  - Needs statin in future    Immunizations/Infection Prevention  Flu vaccine Fall 2022  Prevnar-20 08/04/21  COVID-19 vaccine booster recommended         Obesity  - Patient was unable to fill Wegovy due to cost/lack of insurance coverage    Memory Loss/Confusion.  - Brain MRI 07/06/18 and 06/15/20 was normal.   - Symptoms initially improved after switching to cyclosporine from tacrolimus, but appear now to be persistent.  - Symptoms worsened after COVID infection  - Sleep study 12/17/19 revealed no sleep disordered breathing with mild to moderate periodic limb movement.  - Will continue melatonin for insomnia and requip for RLS.   - Seen  by Neurology Dr. Sherryll Burger at [facility] 11/09/20 with suggestion to undergo neuropsychiatric testing.     Cholesterol Plaque on Ophthalmology Exam  - Patient reported this finding was made at an ophtho appointment.   - She may need carotid dopplers or other imaging of cerebrovascular system. This was not addressed at her Neuro appointment  - Continue aspirin. Needs to resume statin.     Follow up   6 months in-person visit if possible  Neurology follow up     History of Present Illness    Gina Hart is a 46 y.o. female who is s/p deceased kidney transplant on January 27, 2017. She has experienced no episodes of rejection with normal urinary protein and baseline creatinine <1.0 mg/dL.     She is evaluated today by video visit. She complains of fatigue, dyspnea on exertion, and edema in the ankles at the end of the day. She states the fatigue and DOE has been present since she had COVID-19 in 07/2020. She was treated for a Morganella UTI with 10 days of Cipro starting 01/05/22 then was give fluconazole starting 01/16/22 for 3 days when she developed a vaginal yeast infection after the course of antibiotics. She denies dysuria, allograft tenderness, nausea, vomiting, diarrhea, headaches, tremors, visual changes, or neuropathy pain. She continues to have short-term memory lapses. She also has insomnia and has started taking melatonin.     She was prescribed [facility] for weight loss, but was unable to fill this prescription due to cost. She has not been checking home BP. Immunosuppressive medication doses are unchanged (cyclosporine 125 mg bid, prednisone 5 mg daily). She has held Myfortic due to the EBV viremia history.     Transplant History:  1. 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  2. S/P kidney transplant at [facility] 18-Jan-2107. Deceased donor with KDPI 19%. CMV D+/R-; EBV D+/R+  3. Immunosuppression = campath induction followed by tacrolimus and myfortic maintenance therapy, steroid free regimen.   4. Tacrolimus changed to cyclosporine 07/31/2017 due to neurotoxicity with memory deficits and headaches as primary manifestation.  5. RSV infection 09/10/18  6. EBV viremia 05/2020 (VL 9010) with increased tonsillar uptake on PET CT scans 06/24/20 and 07/11/19. No evidence of PTLD.   7. COVID-19 infection 07/2020    Past Medical History:  1. ESRD secondary to FSGS.   2. Hypertension  3. Scoliosis with history of spinal fusion thoracic spine to L2-3  4. COVID-19 07/2020  5. S/P kidney transplant as stated above    Review of Systems    All other systems are reviewed and are negative.    Medications  Current Outpatient Medications   Medication Sig Dispense Refill   ??? acetaminophen (TYLENOL) 325 MG tablet Take 1-2 tablets (325-650 mg total) by mouth every four (4) hours as needed for pain. 100 tablet 0   ??? amLODIPine (NORVASC) 2.5 MG tablet Take 3 tablets (7.5 mg total) by mouth daily. 90 tablet 11   ??? aspirin (ECOTRIN) 81 MG tablet TAKE 1 TABLET (81 MG) BY MOUTH DAILY 30 tablet 0   ??? biotin 5 mg cap Take 1 capsule (5,000 mcg total) by mouth daily. 30 capsule 11   ??? cyclobenzaprine (FLEXERIL) 10 MG tablet Take 1 tablet (10 mg total) by mouth two (2) times a day as needed for muscle spasms. 45 tablet 0   ??? cycloSPORINE modified (NEORAL) 25 MG capsule Take 5 capsules (125 mg total) by mouth two (2) times a day. 300 capsule 11   ???  fluticasone propionate (FLONASE) 50 mcg/actuation nasal spray 1 spray by Each Nare route daily. 16 g 4   ??? gabapentin (NEURONTIN) 100 MG capsule Take 2 capsules (200 mg total) by mouth Three (3) times a day. 180 capsule 11   ??? hydrOXYzine (ATARAX) 25 MG tablet Take 1 tablet (25 mg total) by mouth nightly as needed. 30 tablet 3   ??? loratadine (CLARITIN) 10 mg tablet Take 10 mg by mouth daily as needed for allergies.     ??? melatonin 5 mg tablet Take 1 tablet (5 mg total) by mouth nightly as needed. 30 tablet 11   ??? metoprolol tartrate (LOPRESSOR) 100 MG tablet Take 1 tablet (100 mg total) by mouth Two (2) times a day. 180 tablet 0   ??? predniSONE (DELTASONE) 5 MG tablet Take 1 tablet (5 mg total) by mouth daily. 30 tablet 11   ??? rOPINIRole (REQUIP) 0.25 MG tablet TAKE ONE TABLET BY MOUTH 1-3 HOURS BEFORE BEDTIME. 30 tablet 0   ??? traZODone (DESYREL) 50 MG tablet Take 2 tablets (100 mg total) by mouth nightly. 60 tablet 11   ??? WEGOVY 0.25 MG/0.5 ML SUBCUTANEOUS PEN INJECTOR Inject 0.25 mg under the skin every seven (7) days. 2 mL 11   ??? zolpidem (AMBIEN) 5 MG tablet Take 1 tablet (5 mg total) by mouth nightly as needed for sleep. 30 tablet 5     No current facility-administered medications for this visit.       Physical Exam  Appears well on today's video visit.     Laboratory Results and Imaging Studies Reviewed in EMR    Video Visit    The patient reports they are currently: at home. I spent 26 minutes on the real-time audio and video with the patient on the date of service. I spent an additional 15 minutes on pre- and post-visit activities on the date of service. I was present in the [facility] clinic during this visit.     The patient was physically located in [place] or a state in which I am permitted to provide care. The patient and/or parent/guardian understood that s/he may incur co-pays and cost sharing, and agreed to the telemedicine visit. The visit was reasonable and appropriate under the circumstances given the patient's presentation at the time.    The patient and/or parent/guardian has been advised of the potential risks and limitations of this mode of treatment (including, but not limited to, the absence of in-person examination) and has agreed to be treated using telemedicine. The patient's/patient's family's questions regarding telemedicine have been answered.     If the visit was completed in an ambulatory setting, the patient and/or parent/guardian has also been advised to contact their provider???s office for worsening conditions, and seek emergency medical treatment and/or call 911 if the patient deems either necessary. and has agreed to be treated using telemedicine. The patient's/patient's family's questions regarding telemedicine have been answered.     If the visit was completed in an ambulatory setting, the patient and/or parent/guardian has also been advised to contact their provider???s office for worsening conditions, and seek emergency medical treatment and/or call 911 if the patient deems either necessary.

## 2022-02-03 LAB — CYCLOSPORINE, TROUGH: CYCLOSPORINE, TROUGH: 209

## 2022-02-07 NOTE — Unmapped (Signed)
[facility] Refill Coordination Note    Specialty Medication(s) to be Shipped:   Transplant: cyclosporine 25mg     Other medication(s) to be shipped: No additional medications requested for fill at this time     Leida Crespin, DOB: 1976/05/23  Phone: [phone] (home)       All above HIPAA information was verified with patient.     Was a Nurse, learning disability used for this call? No    Completed refill call assessment today to schedule patient's medication shipment from the [facility] [phone]).  All relevant notes have been reviewed.     Specialty medication(s) and dose(s) confirmed: Regimen is correct and unchanged.   Changes to medications: Rebie reports no changes at this time.  Changes to insurance: No  New side effects reported not previously addressed with a pharmacist or physician: None reported  Questions for the pharmacist: No    Confirmed patient received a Conservation officer, historic buildings and a Surveyor, mining with first shipment. The patient will receive a drug information handout for each medication shipped and additional FDA Medication Guides as required.       DISEASE/MEDICATION-SPECIFIC INFORMATION        N/A    SPECIALTY MEDICATION ADHERENCE     Medication Adherence    Patient reported X missed doses in the last month: 0  Specialty Medication: cyclosporine 25 mg  Patient is on additional specialty medications: No                                Were doses missed due to medication being on hold? No    Cyclosporine 25 mg: 3 days of medicine on hand       REFERRAL TO PHARMACIST     Referral to the pharmacist: Not needed      [facility]     Shipping address confirmed in Epic.     Delivery Scheduled: Yes, Expected medication delivery date: 02/09/22.     Medication will be delivered via UPS to the prescription address in Epic WAM.    [name]   [facility] Specialty Technician

## 2022-02-08 MED FILL — CYCLOSPORINE MODIFIED 25 MG CAPSULE: ORAL | 30 days supply | Qty: 300 | Fill #6

## 2022-02-09 DIAGNOSIS — N3000 Acute cystitis without hematuria: Principal | ICD-10-CM

## 2022-02-09 MED ORDER — AMOXICILLIN 875 MG-POTASSIUM CLAVULANATE 125 MG TABLET
ORAL_TABLET | Freq: Two times a day (BID) | ORAL | 0 refills | 14 days | Status: CP
Start: 2022-02-09 — End: 2022-02-23

## 2022-02-09 NOTE — Unmapped (Signed)
Patient has grown E.coli in urine from 01/31/22. She has been prescribed Augmentin 875-125 bid for 14 days. She has occasional chills but no other symptoms. She also notes that on voiding she sometimes has residual urine. She was instructed to double void and we will consider referral to urogynecology.   Jackey Loge, MD

## 2022-02-13 DIAGNOSIS — Z940 Kidney transplant status: Principal | ICD-10-CM

## 2022-02-27 DIAGNOSIS — Z940 Kidney transplant status: Principal | ICD-10-CM

## 2022-03-01 NOTE — Unmapped (Signed)
[facility] Refill Coordination Note    Specialty Medication(s) to be Shipped:   Transplant: cyclosporine 25mg     Other medication(s) to be shipped: No additional medications requested for fill at this time     Gina Hart, DOB: 1975/11/22  Phone: [phone] (home)       All above HIPAA information was verified with patient.     Was a Nurse, learning disability used for this call? No    Completed refill call assessment today to schedule patient's medication shipment from the [facility] [phone]).  All relevant notes have been reviewed.     Specialty medication(s) and dose(s) confirmed: Regimen is correct and unchanged.   Changes to medications: Gina Hart reports no changes at this time.  Changes to insurance: No  New side effects reported not previously addressed with a pharmacist or physician: None reported  Questions for the pharmacist: No    Confirmed patient received a Conservation officer, historic buildings and a Surveyor, mining with first shipment. The patient will receive a drug information handout for each medication shipped and additional FDA Medication Guides as required.       DISEASE/MEDICATION-SPECIFIC INFORMATION        N/A    SPECIALTY MEDICATION ADHERENCE     Medication Adherence    Patient reported X missed doses in the last month: 0  Specialty Medication: Cyclosporine 25mg   Patient is on additional specialty medications: No                                Were doses missed due to medication being on hold? No    Cyclosporine 25 mg: 12 days of medicine on hand     REFERRAL TO PHARMACIST     Referral to the pharmacist: Not needed      [facility]     Shipping address confirmed in Epic.     Delivery Scheduled: Yes, Expected medication delivery date: 03/08/22.     Medication will be delivered via UPS to the prescription address in Epic WAM.    Tera Helper   [facility] Specialty Pharmacist

## 2022-03-07 MED FILL — CYCLOSPORINE MODIFIED 25 MG CAPSULE: ORAL | 30 days supply | Qty: 300 | Fill #7

## 2022-03-08 NOTE — Unmapped (Signed)
Lab orders faxed.

## 2022-03-09 NOTE — Unmapped (Signed)
Page on call.no answer, no VM set up    Will update primary coordinator

## 2022-03-13 DIAGNOSIS — Z940 Kidney transplant status: Principal | ICD-10-CM

## 2022-03-13 NOTE — Unmapped (Signed)
Received message from patient that she has a few questions about the following:    Confusion over Biotin dosage - she has been prescribed 5 mg daily, but it trying to purchase OTC and dosing looks different.  TNC recommended she take 5000 mcg daily OTC.      She cannot afford Wegovy and was wondering if there is something else more affordable that she could try.  Per Dr Christena Deem, patient does not qualify for coverage of these medications for weight loss as she is not diabetic.  TNC explained this to patient as well.  Patient confirms that she is checking on pricing of these medications and trying to work out a way to pay out of pocket.  She will update coordinator if she has worked this out and which medication she may be able to afford to confirm first with transplant team if this is ok.

## 2022-03-27 DIAGNOSIS — I10 Essential (primary) hypertension: Principal | ICD-10-CM

## 2022-03-27 DIAGNOSIS — Z940 Kidney transplant status: Principal | ICD-10-CM

## 2022-03-27 MED ORDER — METOPROLOL TARTRATE 100 MG TABLET
ORAL_TABLET | Freq: Two times a day (BID) | ORAL | 3 refills | 90 days | Status: CP
Start: 2022-03-27 — End: 2023-03-27

## 2022-03-31 NOTE — Unmapped (Signed)
[facility] Clinical Assessment & Refill Coordination Note    Gina Hart, DOB: 05-13-1976  Phone: [phone] (home)     All above HIPAA information was verified with patient.     Was a Nurse, learning disability used for this call? No    Specialty Medication(s):   Transplant: cyclosporine 25mg      Current Outpatient Medications   Medication Sig Dispense Refill   ??? acetaminophen (TYLENOL) 325 MG tablet Take 1-2 tablets (325-650 mg total) by mouth every four (4) hours as needed for pain. 100 tablet 0   ??? amLODIPine (NORVASC) 2.5 MG tablet Take 3 tablets (7.5 mg total) by mouth daily. 90 tablet 11   ??? aspirin (ECOTRIN) 81 MG tablet TAKE 1 TABLET (81 MG) BY MOUTH DAILY 30 tablet 0   ??? biotin 5 mg cap Take 1 capsule (5,000 mcg total) by mouth daily. 30 capsule 11   ??? cyclobenzaprine (FLEXERIL) 10 MG tablet Take 1 tablet (10 mg total) by mouth two (2) times a day as needed for muscle spasms. 45 tablet 0   ??? cycloSPORINE modified (NEORAL) 25 MG capsule Take 5 capsules (125 mg total) by mouth two (2) times a day. 300 capsule 11   ??? fluticasone propionate (FLONASE) 50 mcg/actuation nasal spray 1 spray by Each Nare route daily. 16 g 4   ??? gabapentin (NEURONTIN) 100 MG capsule Take 2 capsules (200 mg total) by mouth Three (3) times a day. 180 capsule 11   ??? hydrOXYzine (ATARAX) 25 MG tablet Take 1 tablet (25 mg total) by mouth nightly as needed. 30 tablet 3   ??? loratadine (CLARITIN) 10 mg tablet Take 10 mg by mouth daily as needed for allergies.     ??? melatonin 5 mg tablet Take 1 tablet (5 mg total) by mouth nightly as needed. 30 tablet 11   ??? metoPROLOL tartrate (LOPRESSOR) 100 MG tablet Take 1 tablet (100 mg total) by mouth Two (2) times a day. 180 tablet 3   ??? predniSONE (DELTASONE) 5 MG tablet Take 1 tablet (5 mg total) by mouth daily. 30 tablet 11   ??? rOPINIRole (REQUIP) 0.25 MG tablet TAKE ONE TABLET BY MOUTH 1-3 HOURS BEFORE BEDTIME. 30 tablet 0   ??? traZODone (DESYREL) 50 MG tablet Take 2 tablets (100 mg total) by mouth nightly. 60 tablet 11   ??? WEGOVY 0.25 MG/0.5 ML SUBCUTANEOUS PEN INJECTOR Inject 0.25 mg under the skin every seven (7) days. 2 mL 11   ??? zolpidem (AMBIEN) 5 MG tablet Take 1 tablet (5 mg total) by mouth nightly as needed for sleep. 30 tablet 5     No current facility-administered medications for this visit.        Changes to medications: [name] reports no changes at this time.    Allergies   Allergen Reactions   ??? Ibuprofen Swelling     Other reaction(s): SWELLING/EDEMA   ??? Sulfa (Sulfonamide Antibiotics)        Changes to allergies: No    SPECIALTY MEDICATION ADHERENCE     Cyclosporine 25mg   : 10 days of medicine on hand     Medication Adherence    Patient reported X missed doses in the last month: 0  Specialty Medication: cyclosporine 25mg                       Specialty medication(s) dose(s) confirmed: Regimen is correct and unchanged.     Are there any concerns with adherence? No  Adherence counseling provided? Not needed    CLINICAL MANAGEMENT AND INTERVENTION      Clinical Benefit Assessment:    Do you feel the medicine is effective or helping your condition? Yes    Clinical Benefit counseling provided? Not needed    Adverse Effects Assessment:    Are you experiencing any side effects? No    Are you experiencing difficulty administering your medicine? No    Quality of Life Assessment:    Quality of Life    Rheumatology  Oncology  Dermatology  Cystic Fibrosis          How many days over the past month did your transplant  keep you from your normal activities? For example, brushing your teeth or getting up in the morning. 0    Have you discussed this with your provider? Not needed    Acute Infection Status:    Acute infections noted within Epic:  No active infections  Patient reported infection: None    Therapy Appropriateness:    Is therapy appropriate and patient progressing towards therapeutic goals? Yes, therapy is appropriate and should be continued    DISEASE/MEDICATION-SPECIFIC INFORMATION      N/A    Solid Organ Transplant: Not Applicable    PATIENT SPECIFIC NEEDS     - Does the patient have any physical, cognitive, or cultural barriers? No    - Is the patient high risk? No    - Did the patient require a clinical intervention? No    - Does the patient require physician intervention or other additional services (i.e., nutrition, smoking cessation, social work)? No    SOCIAL DETERMINANTS OF HEALTH     At the [facility], we have learned that life circumstances - like trouble affording food, housing, utilities, or transportation can affect the health of many of our patients.   That is why we wanted to ask: are you currently experiencing any life circumstances that are negatively impacting your health and/or quality of life? No    Social Determinants of Health     Financial Resource Strain: Not on file   Internet Connectivity: Not on file   Food Insecurity: Not on file   Tobacco Use: Low Risk  (08/04/2021)    Patient History    ??? Smoking Tobacco Use: Never    ??? Smokeless Tobacco Use: Never    ??? Passive Exposure: Not on file   Housing/Utilities: Not on file   Alcohol Use: Not on file   Transportation Needs: Not on file   Substance Use: Not on file   Health Literacy: Not on file   Physical Activity: Not on file   Interpersonal Safety: Not on file   Stress: Not on file   Intimate Partner Violence: Not on file   Depression: Not on file   Social Connections: Not on file       Would you be willing to receive help with any of the needs that you have identified today? Not applicable       SHIPPING     Specialty Medication(s) to be Shipped:   Transplant: cyclosporine 25mg     Other medication(s) to be shipped: No additional medications requested for fill at this time     Changes to insurance: No    Delivery Scheduled: Yes, Expected medication delivery date: 04/06/2022.     Medication will be delivered via UPS to the confirmed prescription address in [facility].    The patient will receive a drug information handout for  each medication shipped and additional FDA Medication Guides as required.  Verified that patient has previously received a Conservation officer, historic buildings and a Surveyor, mining.    The patient or caregiver noted above participated in the development of this care plan and knows that they can request review of or adjustments to the care plan at any time.      All of the patient's questions and concerns have been addressed.    Thad Ranger, PharmD   [facility] Specialty Pharmacist

## 2022-03-31 NOTE — Unmapped (Signed)
Received call from patient who would like a refill on her Atorvastatin.  This medication not listed on medication list.  Reached out to Dr Julio Alm to see if ok to reorder medication.  Medication had been listed as patient taking previously, Atorvastatin 10 mg.

## 2022-03-31 NOTE — Unmapped (Signed)
Returned on call page. Patient requesting refill for atorvastatin.  Message sent to primary [facility] and Dr. Margaretmary Bayley to inquire if patient should be on medication as it is not listed as an active medication.  Informed patient that I would call her back.  Patient also requesting to speak to primary TNC.

## 2022-04-03 DIAGNOSIS — Z940 Kidney transplant status: Principal | ICD-10-CM

## 2022-04-03 MED ORDER — ATORVASTATIN 10 MG TABLET
ORAL_TABLET | Freq: Every day | ORAL | 3 refills | 90 days | Status: CP
Start: 2022-04-03 — End: 2023-04-03

## 2022-04-03 NOTE — Unmapped (Signed)
Called and spoke with patient about Atorvastatin refill.  Per Dr Julio Alm, ok to refill.  Patient had requested refill on medication.  Was previously being filled by Dr Darreld Mclean and patient has been tolerating well. Script sent to [facility].

## 2022-04-05 MED FILL — CYCLOSPORINE MODIFIED 25 MG CAPSULE: ORAL | 30 days supply | Qty: 300 | Fill #8

## 2022-04-07 DIAGNOSIS — Z940 Kidney transplant status: Principal | ICD-10-CM

## 2022-04-10 DIAGNOSIS — Z940 Kidney transplant status: Principal | ICD-10-CM

## 2022-04-13 LAB — BASIC METABOLIC PANEL
ANION GAP: 7.3 mmol/L (ref 2.0–15.0)
BLOOD UREA NITROGEN: 20 — ABNORMAL HIGH (ref 7–18)
BUN / CREAT RATIO: 25 — ABNORMAL HIGH (ref 10.00–16.00)
CALCIUM: 9.8 (ref 8.5–10.1)
CHLORIDE: 101 mmol/L (ref 100–108)
CO2: 26.7 mmol/L (ref 21–32)
CREATININE: 0.8 (ref 0.6–1.3)
EGFR CKD-EPI NON-AA MALE: 91 mL/min
GLUCOSE RANDOM: 91 (ref 65–99)
MAGNESIUM: 1.6 — ABNORMAL LOW (ref 1.8–2.4)
OSMOLALITY CALCULATION: 282 — ABNORMAL LOW
PHOSPHORUS: 3.5 (ref 2.6–4.7)
POTASSIUM: 3.9 mmol/L (ref 3.5–5.1)
SODIUM: 135 mmol/L — ABNORMAL LOW (ref 136–145)

## 2022-04-13 LAB — CBC W/ DIFFERENTIAL
BASOPHILS ABSOLUTE COUNT: 0 ul — ABNORMAL LOW (ref 0.01–0.09)
BASOPHILS RELATIVE PERCENT: 0.5 % (ref 0.0–2.0)
EOSINOPHILS ABSOLUTE COUNT: 0.2 (ref 0.04–0.5)
EOSINOPHILS RELATIVE PERCENT: 4.3 % (ref 1.0–8.0)
HEMATOCRIT: 39 (ref 35–48)
HEMOGLOBIN: 13.5 (ref 11.9–16.0)
LYMPHOCYTES ABSOLUTE COUNT: 1.1 (ref 1.0–2.7)
LYMPHOCYTES RELATIVE PERCENT: 27.2 % (ref 20–45)
MEAN CORPUSCULAR HEMOGLOBIN CONC: 34.5 (ref 33.3–35.5)
MEAN CORPUSCULAR HEMOGLOBIN: 30.2 pg (ref 25.8–33.0)
MEAN CORPUSCULAR VOLUME: 87.5 fl (ref 78–95)
MEAN PLATELET VOLUME: 7.9 fl (ref 7.5–11.2)
MONOCYTES ABSOLUTE COUNT: 0.6 (ref 0.2–0.8)
MONOCYTES RELATIVE PERCENT: 14.9 % — ABNORMAL HIGH (ref 4.0–12.0)
NEUTROPHILS ABSOLUTE COUNT: 2.1 (ref 1.7–6.7)
NEUTROPHILS RELATIVE PERCENT: 53.1 % (ref 42–74)
PLATELET COUNT: 238 (ref 150–480)
RED BLOOD CELL COUNT: 4.5 (ref 4.0–5.3)
RED CELL DISTRIBUTION WIDTH: 12.8 % (ref 12.1–15.0)
WHITE BLOOD CELL COUNT: 3.9 — ABNORMAL LOW

## 2022-04-13 LAB — URINALYSIS WITH MICROSCOPY WITH CULTURE REFLEX
BILIRUBIN UA: NEGATIVE
BLOOD UA: NEGATIVE
GLUCOSE UA: NEGATIVE
KETONES UA: NEGATIVE
LEUKOCYTE ESTERASE UA: NEGATIVE
NITRITE UA: NEGATIVE
PH UA: 6 (ref 4.5–8)
PROTEIN UA: NEGATIVE
SPECIFIC GRAVITY UA: 1.02 (ref 1.005–1.030)
UROBILINOGEN UA: 0.2

## 2022-04-24 DIAGNOSIS — Z940 Kidney transplant status: Principal | ICD-10-CM

## 2022-04-24 MED ORDER — AMOXICILLIN 500 MG-POTASSIUM CLAVULANATE 125 MG TABLET
ORAL_TABLET | Freq: Two times a day (BID) | ORAL | 0 refills | 10 days | Status: CP
Start: 2022-04-24 — End: 2022-05-04

## 2022-04-24 NOTE — Unmapped (Signed)
Called and spoke with patient regarding urine culture.  Patient endorses some frequency and urgency.  She denies fever or hematuria.  Per Dr Margaretmary Bayley, patient to take 10 day course of Augmentin.  TNC made patient aware to reach out to transplant team if symptoms not improving.  Patient agreeable with this plan.      Prescription sent to [facility].

## 2022-04-28 DIAGNOSIS — Z940 Kidney transplant status: Principal | ICD-10-CM

## 2022-04-28 MED ORDER — AMLODIPINE 2.5 MG TABLET
ORAL_TABLET | Freq: Every day | ORAL | 11 refills | 30 days | Status: CP
Start: 2022-04-28 — End: 2023-04-28

## 2022-04-28 NOTE — Unmapped (Signed)
Patient called to clarify dose of Amlodipine.  She states that she was previously taking 7.5 mg of Amlodipine.  She states that [facility] recently sent her a new prescription with 5 mg tablets with instructions to take 5 mg.and she has noticed increase in blood pressure while taking new dose.  Medication list reviewed and current medication order noted by TNC is for 7.5 mg Amlodipine.  New prescription sent to [facility] for 7.5 mg.  Patient instructed to continue to monitor blood pressure and notify TNC if blood pressure is either high or low for dose adjustment.  Patient verbalized understanding.

## 2022-05-02 NOTE — Unmapped (Signed)
[facility] Specialty Pharmacy Refill Coordination Note    Specialty Medication(s) to be Shipped:   Transplant: cyclosporine 25mg     Other medication(s) to be shipped: No additional medications requested for fill at this time     Gina Hart, DOB: [birth year]  Phone: [phone] (home)       All above HIPAA information was verified with patient.     Was a Nurse, learning disability used for this call? No    Completed refill call assessment today to schedule patient's medication shipment from the [facility] [phone]).  All relevant notes have been reviewed.     Specialty medication(s) and dose(s) confirmed: Regimen is correct and unchanged.   Changes to medications: Gina Hart reports no changes at this time.  Changes to insurance: No  New side effects reported not previously addressed with a pharmacist or physician: None reported  Questions for the pharmacist: No    Confirmed patient received a Conservation officer, historic buildings and a Surveyor, mining with first shipment. The patient will receive a drug information handout for each medication shipped and additional FDA Medication Guides as required.       DISEASE/MEDICATION-SPECIFIC INFORMATION        N/A    SPECIALTY MEDICATION ADHERENCE     Medication Adherence    Patient reported X missed doses in the last month: 0  Specialty Medication: cyclosporine 25 mg  Patient is on additional specialty medications: No  Patient is on more than two specialty medications: No  Any gaps in refill history greater than 2 weeks in the last 3 months: no  Demonstrates understanding of importance of adherence: yes  Informant: patient                          Were doses missed due to medication being on hold? No    Cyclosporine 25mg : Patient has 7 days of medication on hand    REFERRAL TO PHARMACIST     Referral to the pharmacist: Not needed      [facility]     Shipping address confirmed in Epic.     Delivery Scheduled: Yes, Expected medication delivery date: 11/10.     Medication will be delivered via UPS to the prescription address in Epic WAM.    Gina Hart   [facility] Specialty Technician

## 2022-05-04 MED FILL — CYCLOSPORINE MODIFIED 25 MG CAPSULE: ORAL | 30 days supply | Qty: 300 | Fill #9

## 2022-05-27 NOTE — Unmapped (Signed)
[facility] Special[facility] Pharmacy Refill Coordination Note    Specialty Medication(s) to be Shipped:   Transplant: cyclosporine 25mg     Other medication(s) to be shipped: No additional medications requested for fill at this time     Gina Hart, DOB: [birth year]  Phone: [phone] (home)       All above HIPAA information was verified with patient.     Was a Nurse, learning disability used for this call? No    Completed refill call assessment today to schedule patient's medication shipment from the [facility] [phone]).  All relevant notes have been reviewed.     Specialty medication(s) and dose(s) confirmed: Regimen is correct and unchanged.   Changes to medications: Gina Hart reports no changes at this time.  Changes to insurance: No  New side effects reported not previously addressed with a pharmacist or physician: None reported  Questions for the pharmacist: No    Confirmed patient received a Conservation officer, historic buildings and a Surveyor, mining with first shipment. The patient will receive a drug information handout for each medication shipped and additional FDA Medication Guides as required.       DISEASE/MEDICATION-SPECIFIC INFORMATION        N/A    SPECIALTY MEDICATION ADHERENCE     Medication Adherence    Specialty Medication: cycloSPORINE modified 25 MG capsule (NEORAL)  Patient is on additional specialty medications: No  Patient is on more than two specialty medications: No                                Were doses missed due to medication being on hold? No    Cyclosporine 25mg : Patient has 10 days of medication on hand    REFERRAL TO PHARMACIST     Referral to the pharmacist: Not needed      [facility]     Shipping address confirmed in Epic.     Delivery Scheduled: Yes, Expected medication delivery date: 06/01/22.     Medication will be delivered via UPS to the prescription address in Epic WAM.    [name]   [facility] Specialty Technician

## 2022-05-31 MED FILL — CYCLOSPORINE MODIFIED 25 MG CAPSULE: ORAL | 30 days supply | Qty: 300 | Fill #10

## 2022-06-06 LAB — CBC W/ DIFFERENTIAL
BASOPHILS ABSOLUTE COUNT: 0 10*3/uL — ABNORMAL LOW (ref 0.01–0.09)
BASOPHILS RELATIVE PERCENT: 1 % (ref 0.0–2.0)
EOSINOPHILS ABSOLUTE COUNT: 0.2 (ref 0.04–0.5)
EOSINOPHILS RELATIVE PERCENT: 3.7 % (ref 1.0–8.0)
HEMATOCRIT: 39 (ref 35–48)
HEMOGLOBIN: 13.3 (ref 11.9–16.0)
LYMPHOCYTES ABSOLUTE COUNT: 1 (ref 1.0–2.7)
LYMPHOCYTES RELATIVE PERCENT: 24.2 % (ref 20–45)
MEAN CORPUSCULAR HEMOGLOBIN CONC: 34.2 (ref 33.3–35.5)
MEAN CORPUSCULAR HEMOGLOBIN: 30 pg (ref 25.8–33.0)
MEAN CORPUSCULAR VOLUME: 87.8 fl (ref 78–95)
MEAN PLATELET VOLUME: 8 fl (ref 7.5–11.2)
MONOCYTES ABSOLUTE COUNT: 0.6 (ref 0.2–0.8)
MONOCYTES RELATIVE PERCENT: 13.6 % — ABNORMAL HIGH (ref 4.0–12.0)
NEUTROPHILS ABSOLUTE COUNT: 2.5 (ref 1.7–6.7)
NEUTROPHILS RELATIVE PERCENT: 57.5 % (ref 42–74)
PLATELET COUNT: 245 (ref 150–480)
RED BLOOD CELL COUNT: 4.4 (ref 4.0–5.3)
RED CELL DISTRIBUTION WIDTH: 13.1 % (ref 12.1–15.0)
WHITE BLOOD CELL COUNT: 4.3

## 2022-06-06 LAB — BASIC METABOLIC PANEL
ANION GAP: 7.4 mmol/L (ref 2.0–15.0)
BLOOD UREA NITROGEN: 19 — ABNORMAL HIGH (ref 7–18)
BUN / CREAT RATIO: 23.8 — ABNORMAL HIGH (ref 10.00–16.00)
CALCIUM: 9.3 (ref 8.5–10.1)
CHLORIDE: 100 mmol/L (ref 100–108)
CO2: 25.6 mmol/L (ref 21–32)
CREATININE: 0.8 (ref 0.6–1.3)
EGFR CKD-EPI AA FEMALE: 91 mL/min
GLUCOSE RANDOM: 95 (ref 65–99)
MAGNESIUM: 1.7 — ABNORMAL LOW (ref 1.8–2.4)
OSMOLALITY CALCULATION: 278 — ABNORMAL LOW
PHOSPHORUS: 3.6 (ref 2.6–4.7)
POTASSIUM: 4.3 mmol/L (ref 3.5–5.1)
SODIUM: 133 mmol/L — ABNORMAL LOW (ref 136–145)

## 2022-06-06 LAB — URINALYSIS WITH MICROSCOPY
BILIRUBIN UA: NEGATIVE
GLUCOSE UA: NEGATIVE
KETONES UA: NEGATIVE
LEUKOCYTE ESTERASE UA: NEGATIVE
NITRITE UA: NEGATIVE
PH UA: 6.5 (ref 4.5–8)
PROTEIN UA: NEGATIVE
SPECIFIC GRAVITY UA: 1.015 (ref 1.005–1.030)
UROBILINOGEN UA: 0.2

## 2022-06-07 NOTE — Unmapped (Signed)
Received message from patient.  Patient would like TSH checked and would like to review an online pharmacy she discovered to give Ozempic at a discount price and have their own prescribers.  Reviewed with Dr Christena Deem who feels this is ok.   Patient updated.

## 2022-06-22 LAB — CYCLOSPORINE, TROUGH: CYCLOSPORINE, TROUGH: 237

## 2022-06-22 NOTE — Unmapped (Signed)
[name] Specialty Pharmacy Refill Coordination Note    Specialty Medication(s) to be Shipped:   Transplant: cyclosporine 25mg     Other medication(s) to be shipped: No additional medications requested for fill at this time     Gina Hart, DOB: [birth year]  Phone: [phone] (home)       All above HIPAA information was verified with patient.     Was a Nurse, learning disability used for this call? No    Completed refill call assessment today to schedule patient's medication shipment from the [facility] [phone]).  All relevant notes have been reviewed.     Specialty medication(s) and dose(s) confirmed: Regimen is correct and unchanged.   Changes to medications: Gina Hart reports no changes at this time.  Changes to insurance: No  New side effects reported not previously addressed with a pharmacist or physician: None reported  Questions for the pharmacist: No    Confirmed patient received a Conservation officer, historic buildings and a Surveyor, mining with first shipment. The patient will receive a drug information handout for each medication shipped and additional FDA Medication Guides as required.       DISEASE/MEDICATION-SPECIFIC INFORMATION        N/A    SPECIALTY MEDICATION ADHERENCE     Medication Adherence    Patient reported X missed doses in the last month: 0  Specialty Medication: cycloSPORINE modified 25 MG capsule (NEORAL)  Patient is on additional specialty medications: No  Patient is on more than two specialty medications: No                                Were doses missed due to medication being on hold? No    Cyclosporine 25mg : Patient has 10 days of medication on hand    REFERRAL TO PHARMACIST     Referral to the pharmacist: Not needed      [facility]     Shipping address confirmed in Epic.     Delivery Scheduled: Yes, Expected medication delivery date: 06/29/22.     Medication will be delivered via UPS to the prescription address in Epic WAM.    Gina Hart   [facility] Specialty Technician

## 2022-06-23 DIAGNOSIS — Z940 Kidney transplant status: Principal | ICD-10-CM

## 2022-06-28 MED FILL — CYCLOSPORINE MODIFIED 25 MG CAPSULE: ORAL | 30 days supply | Qty: 300 | Fill #11

## 2022-07-06 LAB — BASIC METABOLIC PANEL
ANION GAP: 8.7 mmol/L (ref 2.0–15.0)
BLOOD UREA NITROGEN: 17 (ref 7–18)
BUN / CREAT RATIO: 18.9 — ABNORMAL HIGH (ref 10.00–16.00)
CALCIUM: 9.7 (ref 8.5–10.1)
CHLORIDE: 104 mmol/L (ref 100–108)
CO2: 24.3 mmol/L (ref 21–32)
CREATININE: 0.9 (ref 0.6–1.3)
EGFR CKD-EPI AA FEMALE: 79 mL/min
GLUCOSE RANDOM: 94 (ref 65–99)
MAGNESIUM: 1.6 — ABNORMAL LOW (ref 1.8–2.4)
OSMOLALITY CALCULATION: 285 — ABNORMAL LOW
PHOSPHORUS: 3.5 (ref 2.6–4.7)
POTASSIUM: 3.8 mmol/L (ref 3.5–5.1)
SODIUM: 137 mmol/L (ref 136–145)

## 2022-07-06 LAB — CBC W/ DIFFERENTIAL
BASOPHILS ABSOLUTE COUNT: 0 /uL — ABNORMAL LOW
BASOPHILS RELATIVE PERCENT: 1.1 % (ref 0.0–2.0)
EOSINOPHILS ABSOLUTE COUNT: 0.1 (ref 0.04–0.5)
EOSINOPHILS RELATIVE PERCENT: 3.6 % (ref 1.0–8.0)
HEMATOCRIT: 39.9 (ref 35–48)
HEMOGLOBIN: 13.1 (ref 11.9–16.0)
LYMPHOCYTES ABSOLUTE COUNT: 1 (ref 1.0–2.7)
LYMPHOCYTES RELATIVE PERCENT: 27.6 % (ref 20–45)
MEAN CORPUSCULAR HEMOGLOBIN CONC: 32.9 — ABNORMAL LOW (ref 33.3–35.5)
MEAN CORPUSCULAR HEMOGLOBIN: 29.1 pg (ref 25.8–33.0)
MEAN CORPUSCULAR VOLUME: 88.4 fl (ref 78–95)
MEAN PLATELET VOLUME: 8.4 fl (ref 7.5–11.2)
MONOCYTES ABSOLUTE COUNT: 0.5 (ref 0.2–0.8)
MONOCYTES RELATIVE PERCENT: 13.9 % — ABNORMAL HIGH (ref 4.0–12.0)
NEUTROPHILS ABSOLUTE COUNT: 1.9 (ref 1.7–6.7)
NEUTROPHILS RELATIVE PERCENT: 53.8 % (ref 42–74)
PLATELET COUNT: 228 (ref 150–480)
RED BLOOD CELL COUNT: 4.5 (ref 4.0–5.3)
RED CELL DISTRIBUTION WIDTH: 13.1 % (ref 12.1–15.0)
WHITE BLOOD CELL COUNT: 3.6 — ABNORMAL LOW (ref 4.0–10.5)

## 2022-07-06 LAB — URINALYSIS WITH MICROSCOPY WITH CULTURE REFLEX
BILIRUBIN UA: NEGATIVE
GLUCOSE UA: NEGATIVE
KETONES UA: NEGATIVE
LEUKOCYTE ESTERASE UA: NEGATIVE
NITRITE UA: NEGATIVE
PH UA: 6 (ref 4.5–8)
PROTEIN UA: NEGATIVE
SPECIFIC GRAVITY UA: 1.025 (ref 1.005–1.030)
UROBILINOGEN UA: 0.2

## 2022-07-06 NOTE — Unmapped (Signed)
Pt paged on call TNC. She is at [facility] for lab (lipid) check and no orders. Resent updated monthly orders with one time order for lipid panel. Spoke with Diane at [employer] and sent direct fax to [phone]

## 2022-07-14 DIAGNOSIS — R7303 Prediabetes: Principal | ICD-10-CM

## 2022-07-14 DIAGNOSIS — Z940 Kidney transplant status: Principal | ICD-10-CM

## 2022-07-14 NOTE — Unmapped (Signed)
Returned patient's call to discuss her urine culture.  Low level bacteria on culture, but patient denies symptoms at this time.  Order placed for repeat urine culture as well as TSH and EBV per patient's request.  She will obtain labs in early February.

## 2022-07-16 DIAGNOSIS — Z940 Kidney transplant status: Principal | ICD-10-CM

## 2022-07-16 MED ORDER — CYCLOSPORINE MODIFIED 25 MG CAPSULE
ORAL_CAPSULE | Freq: Two times a day (BID) | ORAL | 11 refills | 30 days
Start: 2022-07-16 — End: 2023-07-16

## 2022-07-17 MED ORDER — CYCLOSPORINE MODIFIED 25 MG CAPSULE
ORAL_CAPSULE | Freq: Two times a day (BID) | ORAL | 11 refills | 30 days | Status: CP
Start: 2022-07-17 — End: 2023-07-17
  Filled 2022-07-31: qty 300, 30d supply, fill #0

## 2022-07-17 NOTE — Unmapped (Signed)
Pt request for RX Refill

## 2022-07-21 DIAGNOSIS — R82998 Other abnormal findings in urine: Principal | ICD-10-CM

## 2022-07-21 DIAGNOSIS — R7989 Other specified abnormal findings of blood chemistry: Principal | ICD-10-CM

## 2022-07-21 DIAGNOSIS — Z940 Kidney transplant status: Principal | ICD-10-CM

## 2022-07-26 NOTE — Unmapped (Signed)
[facility] Specialty Pharmacy Clinical Assessment & Refill Coordination Note    Gina Hart, DOB: Dec 23, 1975  Phone: [phone] (home)     All above HIPAA information was verified with patient.     Was a Nurse, learning disability used for this call? No    Specialty Medication(s):   Transplant: cyclosporine 25mg      Current Outpatient Medications   Medication Sig Dispense Refill    acetaminophen (TYLENOL) 325 MG tablet Take 1-2 tablets (325-650 mg total) by mouth every four (4) hours as needed for pain. 100 tablet 0    amlodipine (NORVASC) 2.5 MG tablet Take 3 tablets (7.5 mg total) by mouth daily. 90 tablet 11    aspirin (ECOTRIN) 81 MG tablet TAKE 1 TABLET (81 MG) BY MOUTH DAILY 30 tablet 0    atorvastatin (LIPITOR) 10 MG tablet Take 1 tablet (10 mg total) by mouth daily. 90 tablet 3    biotin 5 mg cap Take 1 capsule (5,000 mcg total) by mouth daily. 30 capsule 11    cyclobenzaprine (FLEXERIL) 10 MG tablet Take 1 tablet (10 mg total) by mouth two (2) times a day as needed for muscle spasms. 45 tablet 0    cycloSPORINE modified (NEORAL) 25 MG capsule Take 5 capsules (125 mg total) by mouth two (2) times a day. 300 capsule 11    fluticasone propionate (FLONASE) 50 mcg/actuation nasal spray 1 spray by Each Nare route daily. 16 g 4    gabapentin (NEURONTIN) 100 MG capsule Take 2 capsules (200 mg total) by mouth Three (3) times a day. 180 capsule 11    hydrOXYzine (ATARAX) 25 MG tablet Take 1 tablet (25 mg total) by mouth nightly as needed. 30 tablet 3    loratadine (CLARITIN) 10 mg tablet Take 10 mg by mouth daily as needed for allergies.      melatonin 5 mg tablet Take 1 tablet (5 mg total) by mouth nightly as needed. 30 tablet 11    metoPROLOL tartrate (LOPRESSOR) 100 MG tablet Take 1 tablet (100 mg total) by mouth Two (2) times a day. 180 tablet 3    predniSONE (DELTASONE) 5 MG tablet Take 1 tablet (5 mg total) by mouth daily. 30 tablet 11    rOPINIRole (REQUIP) 0.25 MG tablet TAKE ONE TABLET BY MOUTH 1-3 HOURS BEFORE BEDTIME. 30 tablet 0    traZODone (DESYREL) 50 MG tablet Take 2 tablets (100 mg total) by mouth nightly. 60 tablet 11    WEGOVY 0.25 MG/0.5 ML SUBCUTANEOUS PEN INJECTOR Inject 0.25 mg under the skin every seven (7) days. 2 mL 11    zolpidem (AMBIEN) 5 MG tablet Take 1 tablet (5 mg total) by mouth nightly as needed for sleep. 30 tablet 5     No current facility-administered medications for this visit.        Changes to medications:  patient not home to go through medications but says no recent changes    Allergies   Allergen Reactions    Ibuprofen Swelling     Other reaction(s): SWELLING/EDEMA    Sulfa (Sulfonamide Antibiotics)        Changes to allergies: No    SPECIALTY MEDICATION ADHERENCE     Cyclosporine 25mg   : 10 days of medicine on hand     Medication Adherence    Patient reported X missed doses in the last month: 0  Specialty Medication: cycloSPORINE modified 25 MG capsule (NEORAL)  Patient is on additional specialty medications: No  Specialty medication(s) dose(s) confirmed: Regimen is correct and unchanged.     Are there any concerns with adherence? No    Adherence counseling provided? Not needed    CLINICAL MANAGEMENT AND INTERVENTION      Clinical Benefit Assessment:    Do you feel the medicine is effective or helping your condition? Yes    Clinical Benefit counseling provided? Not needed    Adverse Effects Assessment:    Are you experiencing any side effects? No    Are you experiencing difficulty administering your medicine? No    Quality of Life Assessment:    Quality of Life    Rheumatology  Oncology  Dermatology  Cystic Fibrosis          How many days over the past month did your transplant  keep you from your normal activities? For example, brushing your teeth or getting up in the morning. 0    Have you discussed this with your provider? Not needed    Acute Infection Status:    Acute infections noted within Epic:  No active infections  Patient reported infection: None    Therapy Appropriateness:    Is therapy appropriate and patient progressing towards therapeutic goals? Yes, therapy is appropriate and should be continued    DISEASE/MEDICATION-SPECIFIC INFORMATION      N/A    Solid Organ Transplant: Not Applicable    PATIENT SPECIFIC NEEDS     Does the patient have any physical, cognitive, or cultural barriers? No    Is the patient high risk? No    Did the patient require a clinical intervention? No    Does the patient require physician intervention or other additional services (i.e., nutrition, smoking cessation, social work)? No    SOCIAL DETERMINANTS OF HEALTH     At the [facility], we have learned that life circumstances - like trouble affording food, housing, utilities, or transportation can affect the health of many of our patients.   That is why we wanted to ask: are you currently experiencing any life circumstances that are negatively impacting your health and/or quality of life? No    Social Determinants of Psychologist, prison and probation services Strain: Not on file   Internet Connectivity: Not on file   Food Insecurity: Not on file   Tobacco Use: Low Risk  (08/04/2021)    Patient History     Smoking Tobacco Use: Never     Smokeless Tobacco Use: Never     Passive Exposure: Not on file   Housing/Utilities: Not on file   Alcohol Use: Not on file   Transportation Needs: Not on file   Substance Use: Not on file   Health Literacy: Not on file   Physical Activity: Not on file   Interpersonal Safety: Not on file   Stress: Not on file   Intimate Partner Violence: Not on file   Depression: Not on file   Social Connections: Not on file       Would you be willing to receive help with any of the needs that you have identified today? Not applicable       SHIPPING     Specialty Medication(s) to be Shipped:   Transplant: cyclosporine 25mg     Other medication(s) to be shipped: No additional medications requested for fill at this time     Changes to insurance: No    Delivery Scheduled: Yes, Expected medication delivery date: 08/01/2022.     Medication will be delivered via UPS to  the confirmed prescription address in [facility].    The patient will receive a drug information handout for each medication shipped and additional FDA Medication Guides as required.  Verified that patient has previously received a Conservation officer, historic buildings and a Surveyor, mining.    The patient or caregiver noted above participated in the development of this care plan and knows that they can request review of or adjustments to the care plan at any time.      All of the patient's questions and concerns have been addressed.    Thad Ranger, PharmD   [facility] Specialty Pharmacist

## 2022-08-03 NOTE — Unmapped (Signed)
Received page from patient asking for TSH and EBV level be faxed to [facility] lab fax [phone].  Order faxed.     Routed message to primary coordinator.

## 2022-08-11 LAB — EBV QUANTITATIVE PCR, BLOOD
EBV QUANT: 899 [IU]/mL — AB
LOG10 EBV DNA QN PCR: 2.954 — AB

## 2022-08-15 NOTE — Unmapped (Signed)
Patient paged on call to review labs    Patient concerned over EBV level    Reports some swelling in BLE when she does not elevate. Unsure what her BP is. Asked her to check and review with her coordinator when she calls back. She verbalized understanding    Let patient know I will message her primary team and ask the plan for elevated EBV levels and have them call her back with plan.     Routed call to primary coordinator

## 2022-08-16 DIAGNOSIS — B2700 Gammaherpesviral mononucleosis without complication: Principal | ICD-10-CM

## 2022-08-16 DIAGNOSIS — Z940 Kidney transplant status: Principal | ICD-10-CM

## 2022-08-16 DIAGNOSIS — R779 Abnormality of plasma protein, unspecified: Principal | ICD-10-CM

## 2022-08-16 NOTE — Unmapped (Signed)
Spoke with patient regarding recent positive EBV results.  Per Dr Margaretmary Bayley, patient will repeat PET CT.  Patient made aware.    She also endorses bilateral ankle swelling, especially at the end of the day.  She will check BP tonight and tomorrow morning and report results to coordinator for plan going forward.

## 2022-08-16 NOTE — Unmapped (Signed)
[facility] Special[facility] Pharmacy Refill Coordination Note    Specialty Medication(s) to be Shipped:   N/a    Other medication(s) to be shipped: No additional medications requested for fill at this time     Gina Hart, DOB: 12-Jul-1975  Phone: [phone] (home)       All above HIPAA information was verified with patient.     Was a Nurse, learning disability used for this call? No    Completed refill call assessment today to schedule patient's medication shipment from the [facility] [phone]).  All relevant notes have been reviewed.     Specialty medication(s) and dose(s) confirmed: Regimen is correct and unchanged.   Changes to medications: Gina Hart reports no changes at this time.  Changes to insurance: No  New side effects reported not previously addressed with a pharmacist or physician: None reported  Questions for the pharmacist: No    Confirmed patient received a Conservation officer, historic buildings and a Surveyor, mining with first shipment. The patient will receive a drug information handout for each medication shipped and additional FDA Medication Guides as required.       DISEASE/MEDICATION-SPECIFIC INFORMATION        N/A    SPECIALTY MEDICATION ADHERENCE     Medication Adherence    Patient reported X missed doses in the last month: 0  Specialty Medication: cyclosporine 25mg               Were doses missed due to medication being on hold? No    Cyclosporine 25mg   : 18 days of medicine on hand       REFERRAL TO PHARMACIST     Referral to the pharmacist: Not needed      [facility]     Shipping address confirmed in Epic.     Patient was notified of new phone menu : Yes    Delivery Scheduled: Patient declined refill at this time due to supply on hand, call set up for 1-2 weeks out per patient request.     Medication will be delivered via  n/a  to the  n/a  address in Epic WAM.    Thad Ranger, PharmD   [facility] Specialty Pharmacist

## 2022-08-17 NOTE — Unmapped (Signed)
[facility] Specialty Pharmacy Refill Coordination Note    Specialty Medication(s) to be Shipped:   Transplant: cyclosporine 25mg     Other medication(s) to be shipped: No additional medications requested for fill at this time     Gina Hart, DOB: 1976-06-21  Phone: [phone] (home)       All above HIPAA information was verified with patient.     Was a Nurse, learning disability used for this call? No    Completed refill call assessment today to schedule patient's medication shipment from the [facility] [phone]).  All relevant notes have been reviewed.     Specialty medication(s) and dose(s) confirmed: Regimen is correct and unchanged.   Changes to medications: Gina Hart reports no changes at this time.  Changes to insurance: No  New side effects reported not previously addressed with a pharmacist or physician: None reported  Questions for the pharmacist: No    Confirmed patient received a Conservation officer, historic buildings and a Surveyor, mining with first shipment. The patient will receive a drug information handout for each medication shipped and additional FDA Medication Guides as required.       DISEASE/MEDICATION-SPECIFIC INFORMATION        N/A    SPECIALTY MEDICATION ADHERENCE              Were doses missed due to medication being on hold? No    cyclosporine 25 mg: 14 days of medicine on hand       REFERRAL TO PHARMACIST     Referral to the pharmacist: Not needed      [facility]     Shipping address confirmed in Epic.     Patient was notified of new phone menu : Yes    Delivery Scheduled: Yes, Expected medication delivery date: 0301.     Medication will be delivered via UPS to the prescription address in Epic WAM.    Antonietta Barcelona   [facility] Specialty Technician

## 2022-08-24 DIAGNOSIS — R82998 Other abnormal findings in urine: Principal | ICD-10-CM

## 2022-08-24 DIAGNOSIS — D849 Immunodeficiency, unspecified: Principal | ICD-10-CM

## 2022-08-24 DIAGNOSIS — Z940 Kidney transplant status: Principal | ICD-10-CM

## 2022-08-24 DIAGNOSIS — Z48298 Encounter for aftercare following other organ transplant: Principal | ICD-10-CM

## 2022-08-25 MED FILL — CYCLOSPORINE MODIFIED 25 MG CAPSULE: ORAL | 30 days supply | Qty: 300 | Fill #1

## 2022-08-30 ENCOUNTER — Ambulatory Visit: Admit: 2022-08-30 | Discharge: 2022-08-31 | Payer: MEDICARE

## 2022-08-30 ENCOUNTER — Ambulatory Visit: Admit: 2022-08-30 | Discharge: 2022-08-31 | Payer: MEDICARE | Attending: Nephrology | Primary: Nephrology

## 2022-08-30 DIAGNOSIS — D849 Immunodeficiency, unspecified: Principal | ICD-10-CM

## 2022-08-30 DIAGNOSIS — Z48298 Encounter for aftercare following other organ transplant: Principal | ICD-10-CM

## 2022-08-30 DIAGNOSIS — Z940 Kidney transplant status: Principal | ICD-10-CM

## 2022-08-30 LAB — URINALYSIS WITH MICROSCOPY WITH CULTURE REFLEX
BILIRUBIN UA: NEGATIVE
BLOOD UA: NEGATIVE
GLUCOSE UA: NEGATIVE
KETONES UA: NEGATIVE
LEUKOCYTE ESTERASE UA: NEGATIVE
NITRITE UA: NEGATIVE
PH UA: 5.5 (ref 5.0–9.0)
PROTEIN UA: NEGATIVE
RBC UA: 2 /HPF (ref 0–3)
SPECIFIC GRAVITY UA: >=1.03 (ref 1.005–1.030)
SQUAMOUS EPITHELIAL: 3 /HPF (ref 0–5)
UROBILINOGEN UA: 0.2
WBC UA: 2 /HPF (ref 0–3)

## 2022-08-30 LAB — CBC W/ AUTO DIFF
BASOPHILS ABSOLUTE COUNT: 0 10*9/L (ref 0.0–0.1)
BASOPHILS RELATIVE PERCENT: 0.8 %
EOSINOPHILS ABSOLUTE COUNT: 0.2 10*9/L (ref 0.0–0.5)
EOSINOPHILS RELATIVE PERCENT: 2.9 %
HEMATOCRIT: 41.5 % (ref 34.0–44.0)
HEMOGLOBIN: 13.9 g/dL (ref 11.3–14.9)
LYMPHOCYTES ABSOLUTE COUNT: 1.4 10*9/L (ref 1.1–3.6)
LYMPHOCYTES RELATIVE PERCENT: 24.1 %
MEAN CORPUSCULAR HEMOGLOBIN CONC: 33.5 g/dL (ref 32.0–36.0)
MEAN CORPUSCULAR HEMOGLOBIN: 29.3 pg (ref 25.9–32.4)
MEAN CORPUSCULAR VOLUME: 87.5 fL (ref 77.6–95.7)
MEAN PLATELET VOLUME: 8.1 fL (ref 6.8–10.7)
MONOCYTES ABSOLUTE COUNT: 0.9 10*9/L — ABNORMAL HIGH (ref 0.3–0.8)
MONOCYTES RELATIVE PERCENT: 15.2 %
NEUTROPHILS ABSOLUTE COUNT: 3.3 10*9/L (ref 1.8–7.8)
NEUTROPHILS RELATIVE PERCENT: 57 %
NUCLEATED RED BLOOD CELLS: 0 /100{WBCs} (ref <=4)
PLATELET COUNT: 252 10*9/L (ref 150–450)
RED BLOOD CELL COUNT: 4.74 10*12/L (ref 3.95–5.13)
RED CELL DISTRIBUTION WIDTH: 13.1 % (ref 12.2–15.2)
WBC ADJUSTED: 5.9 10*9/L (ref 3.6–11.2)

## 2022-08-30 LAB — BASIC METABOLIC PANEL
ANION GAP: 9 mmol/L (ref 5–14)
BLOOD UREA NITROGEN: 17 mg/dL (ref 9–23)
BUN / CREAT RATIO: 23
CALCIUM: 10.1 mg/dL (ref 8.7–10.4)
CHLORIDE: 108 mmol/L — ABNORMAL HIGH (ref 98–107)
CO2: 21.9 mmol/L (ref 20.0–31.0)
CREATININE: 0.74 mg/dL
EGFR CKD-EPI (2021) FEMALE: >90 mL/min/{1.73_m2} (ref >=60)
GLUCOSE RANDOM: 87 mg/dL (ref 70–99)
POTASSIUM: 4 mmol/L (ref 3.4–4.8)
SODIUM: 139 mmol/L (ref 135–145)

## 2022-08-30 LAB — PHOSPHORUS: PHOSPHORUS: 3.1 mg/dL (ref 2.4–5.1)

## 2022-08-30 LAB — MAGNESIUM: MAGNESIUM: 1.7 mg/dL (ref 1.6–2.6)

## 2022-08-30 MED ORDER — CHLORTHALIDONE 25 MG TABLET
ORAL_TABLET | Freq: Every morning | ORAL | 11 refills | 30 days | Status: CP
Start: 2022-08-30 — End: 2023-08-30

## 2022-08-30 NOTE — Unmapped (Signed)
Transplant Coordinator, Clinic Visit   Pt seen today by transplant nephrology for follow up, reviewed medications and symptoms.          08/30/22 1021   BP: 140/91   Pulse: 69   Temp: 36.1 ??C (97 ??F)   Weight: 99 kg (218 lb 3.2 oz)   Height: 150 cm (4' 11.04)   PainSc: 0-No pain       Assessment  BP @ Home: Not checking - education on importance  BG: N/A  HA/Dizziness/Lightheaded: No  Hand tremors: No  Numbness/tingling: No  Fevers/Chills/sweats: No  Chest Pain/SOB: No  N/V/Heartburn: No  Diarrhea/constipation: No  UTI symptoms: No  Swelling: LE swelling at night is worse - went back to work  Pain: Some back pain - Tylenol PRN  Incision/Drain/Foley: N/A    Good appetite; reports adequate hydration: 5-6 water bottles per day    Any new medications? Sea moss supplement - Mary ok'd  Immunosuppressant last taken: 8PM    Immunization status: Denied covid, flu completed     Functional Score: 100   Normal no complaints; no evidence of  disease.   Employment status is: works full time    +Work note for leg swelling  +Start Chlorthalidone, decrease Amlodipine to 5 mg daily

## 2022-08-30 NOTE — Unmapped (Unsigned)
Transplant Nephrology Clinic Visit       Assessment and Plan  Gina Hart is a 47 y.o. female who is s/p deceased donor kidney transplant on 01/11/17. She is seen today for follow up of her kidney transplant, immunosuppression and associated medical problems.     Status post deceased donor kidney transplant 01/11/17  - No history of rejection or evidence of FSGS recurrence.   - Serum creatinine is stable at 0.74 mg/dL (baseline <5.4 mg/dL).   - UP/C has been historically normal. UA today unremarkable.     - DSA screen was negative on 08/04/21 though class II could not be checked due to an interfering substance.    Immunosuppression management.   - Cyclosporine level 223 (target trough 125-225)  - Will continue cyclosporine 125 mg BID and prednisone 5 mg daily.   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   - PET CT 06/25/19 done at [facility] revealed symmetric hypermetabolic activity in the lingual tonsils without evidence of lymphadenopathy elsewhere. ENT evaluation on 07/09/19 revealed enlarged tonsils, enlarged turbinates, and congested nasal mucosa. Repeat PET CT at [facility] on 07/09/19 revealed only slightly increased uptake in the lingual tonsils without evidence of malignancy.  - EBV viral load 899 on 08/03/22  - Will continue to follow periodic EBV viral loads.   - Repeat PET CT scheduled for next week    Hypertension.    - Home BP unknown, clinic BP 140/91  - Edema persists on amlodipine   - Will reduced amlodipine dose to 5 mg daily from 7.5 mg daily  - Will start chlorthalidone 25 mg daily  - Continue metoprolol tartrate 100mg  bid     History of UTI   - Denies current UTI symptoms, UA not suggestive of UTI    History of COVID-19, onset 08/07/20, diagnosed 08/11/20, with long-COVID symptoms   - Had received 2 doses of COVID vaccine prior to illness  - Symptoms included fatigue, reduced appetite, loss of sense of smell, fever, cough, shortness of breath  - No O2 requirement  - Sotrovimab administered 08/12/20   - Though most symptoms resolved she has experienced insomnia, dyspnea on exertion, loss of smell, fatigue, brain fog, and occasional vertigo subsequetly    Back pain, neck pain, left leg pain with history of spinal fusion and cervical spinal stenosis   - She is s/p spinal fusion of thoracic spine through L2-3  - No longer on gabapentin due to side effects    Hyperlipidemia management  - Needs statin in future  - Continue atorvastatin 10 mg daily    Immunizations/Infection Prevention  Flu vaccine Fall 2022  Prevnar-20 08/04/21  COVID-19 vaccine booster recommended, patient declines    Obesity  - Patient was unable to fill Wegovy due to cost/lack of insurance coverage  - BMI now 44.01 kg/m2    Memory Loss/Confusion.  - Brain MRI 07/06/18 and 06/15/20 was normal.   - Symptoms initially improved after switching to cyclosporine from tacrolimus, but appear now to be persistent.  - Symptoms worsened after COVID infection  - Sleep study 12/17/19 revealed no sleep disordered breathing with mild to moderate periodic limb movement.  - Will continue melatonin for insomnia and requip for RLS.   - Seen by Neurology Dr. Sherryll Burger at [facility] 11/09/20 with suggestion to undergo neuropsychiatric testing.     Cholesterol Plaque on Ophthalmology Exam  - Patient reported this finding was made at an ophtho appointment.   - She may need carotid dopplers or other imaging of cerebrovascular system. This was not addressed at her Neuro appointment  - Continue aspirin and atorvastatin.     Follow up   6 months in-person visit if possible  PET CT 09/06/22    History of Present Illness    Gina Hart is a 47 y.o. female who is s/p deceased kidney transplant on February 10, 2017. She has experienced no episodes of rejection with normal urinary protein and baseline creatinine <1.0 mg/dL.     She has not been hospitalized or seen in the ED since her last visit. Today she has several concerns. She notes edema in the evening and dyspnea on exertion. She denies chest pain. She has gained weight and her insurance denies [place].     She also continues to have problems with insomnia. Though she falls asleep easily she often awakens to urinate then cannot go back to sleep. She has been taking melatonin at 7PM nightly. She denies orthopnea. Back and leg pain are improved but not resolved. She denies restless legs.     Home BP is unknown. She denies dysuria, allograft tenderness or diarrhea. She often has AM nausea that resolves once she is up and around. Her sense of smell remains diminished. Short term memory problems are unchanged. She denies headaches or visual changes.     Immunosuppressive medication doses are unchanged (cyclosporine 125 mg bid, prednisone 5 mg daily). She has held Myfortic due to the EBV viremia history.     Transplant History:  1. 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  2. S/P kidney transplant at [facility] 02/01/2107. Deceased donor with KDPI 19%. CMV D+/R-; EBV D+/R+  3. Immunosuppression = campath induction followed by tacrolimus and myfortic maintenance therapy, steroid free regimen.   4. Tacrolimus changed to cyclosporine 07/31/2017 due to neurotoxicity with memory deficits and headaches as primary manifestation.  5. RSV infection 09/10/18  6. EBV viremia 05/2020 (VL 9010) with increased tonsillar uptake on PET CT scans 06/24/20 and 07/11/19. No evidence of PTLD.   7. COVID-19 infection 07/2020    Past Medical History:  1. ESRD secondary to FSGS.   2. Hypertension  3. Scoliosis with history of spinal fusion thoracic spine to L2-3  4. COVID-19 07/2020  5. S/P kidney transplant as stated above    Review of Systems    All other systems are reviewed and are negative.    Medications  Current Outpatient Medications   Medication Sig Dispense Refill    acetaminophen (TYLENOL) 325 MG tablet Take 1-2 tablets (325-650 mg total) by mouth every four (4) hours as needed for pain. 100 tablet 0    amlodipine (NORVASC) 2.5 MG tablet Take 3 tablets (7.5 mg total) by mouth daily. 90 tablet 11    aspirin (ECOTRIN) 81 MG tablet TAKE 1 TABLET (81 MG) BY MOUTH DAILY 30 tablet 0    atorvastatin (LIPITOR) 10 MG tablet Take 1 tablet (10 mg total) by mouth daily. 90 tablet 3    biotin 5 mg cap Take 1 capsule (5,000 mcg total) by mouth daily. 30 capsule 11    cyclobenzaprine (FLEXERIL) 10 MG tablet Take 1 tablet (10 mg total) by mouth two (2) times a day as needed for muscle spasms. 45 tablet 0    cycloSPORINE  modified (NEORAL) 25 MG capsule Take 5 capsules (125 mg total) by mouth two (2) times a day. 300 capsule 11    fluticasone propionate (FLONASE) 50 mcg/actuation nasal spray 1 spray by Each Nare route daily. 16 g 4    gabapentin (NEURONTIN) 100 MG capsule Take 2 capsules (200 mg total) by mouth Three (3) times a day. 180 capsule 11    hydrOXYzine (ATARAX) 25 MG tablet Take 1 tablet (25 mg total) by mouth nightly as needed. 30 tablet 3    loratadine (CLARITIN) 10 mg tablet Take 10 mg by mouth daily as needed for allergies.      melatonin 5 mg tablet Take 1 tablet (5 mg total) by mouth nightly as needed. 30 tablet 11    metoPROLOL tartrate (LOPRESSOR) 100 MG tablet Take 1 tablet (100 mg total) by mouth Two (2) times a day. 180 tablet 3    predniSONE (DELTASONE) 5 MG tablet Take 1 tablet (5 mg total) by mouth daily. 30 tablet 11    rOPINIRole (REQUIP) 0.25 MG tablet TAKE ONE TABLET BY MOUTH 1-3 HOURS BEFORE BEDTIME. 30 tablet 0    traZODone (DESYREL) 50 MG tablet Take 2 tablets (100 mg total) by mouth nightly. 60 tablet 11    WEGOVY 0.25 MG/0.5 ML SUBCUTANEOUS PEN INJECTOR Inject 0.25 mg under the skin every seven (7) days. 2 mL 11    zolpidem (AMBIEN) 5 MG tablet Take 1 tablet (5 mg total) by mouth nightly as needed for sleep. 30 tablet 5     No current facility-administered medications for this visit.       Physical Exam  BP 140/91 (BP Site: L Arm, BP Position: Sitting, BP Cuff Size: Large)  - Pulse 69  - Temp 36.1 ??C (97 ??F) (Temporal)  - Ht 150 cm (4' 11.04)  - Wt 99 kg (218 lb 3.2 oz)  - BMI 44.01 kg/m??   General: Patient is a pleasant female in no apparent distress.  Eyes: Sclera anicteric.  Neck: Supple without LAD/JVD/bruits.  Lungs: Clear to auscultation bilaterally, no wheezes/rales/rhonchi.  Cardiovascular: Regular rate and rhythm without murmurs, rubs or gallops.  Abdomen: Soft, notender/nondistended. Positive bowel sounds. No hepatosplenomegaly, masses or bruits appreciated.  Extremities: Without edema, joints without evidence of synovitis  Skin: Without rash  Neurological: Grossly nonfocal.  Psychiatric: Mood and affect appropriate.     Laboratory Results and Imaging Studies Reviewed in EMR parent/guardian has been advised of the potential risks and limitations of this mode of treatment (including, but not limited to, the absence of in-person examination) and has agreed to be treated using telemedicine. The patient's/patient's family's questions regarding telemedicine have been answered.     If the visit was completed in an ambulatory setting, the patient and/or parent/guardian has also been advised to contact their provider???s office for worsening conditions, and seek emergency medical treatment and/or call 911 if the patient deems either necessary.

## 2022-08-31 DIAGNOSIS — Z940 Kidney transplant status: Principal | ICD-10-CM

## 2022-08-31 LAB — CYCLOSPORINE, TROUGH: CYCLOSPORINE, TROUGH: 223 ng/mL (ref 100–400)

## 2022-08-31 MED ORDER — AMLODIPINE 2.5 MG TABLET
ORAL_TABLET | Freq: Every day | ORAL | 11 refills | 30 days | Status: CP
Start: 2022-08-31 — End: 2023-08-31

## 2022-08-31 NOTE — Unmapped (Signed)
Received message from patient regarding her Amlodipine dose.  Confirmed that patient is now taking 5 mg Amlodipine.  She would like a new work note for her leg swelling and states that she is not able to rest or be off her feet at work like previous note advised and that she would like to take extended time off work to take care of herself.  TNC advised patient to apply for FMLA and that she will need to contact her employer for this.  Note provided for 1 week off work while patient contacts her employer to take care of this.

## 2022-09-06 NOTE — Unmapped (Addendum)
Received message from patient who reports she checked her blood pressure this morning and it was 150/95 this morning prior to medications.  She has not been checking blood pressures prior to now.  Advised patient to repeat BP this evening after medications and again tomorrow morning to report additional readings.  She is currently taking Chlorthalidone 25 mg daily and Amlodipine 5 mg daily.    Repeat blood pressures remain in 150's/80's-90's.  Per Dr Margaretmary Bayley, patient to increase Amlodipine to previous dose of 7.5 mg and continue to monitor blood pressures.  Patient verbalized understanding.

## 2022-09-07 MED ORDER — AMLODIPINE 2.5 MG TABLET
ORAL_TABLET | Freq: Every day | ORAL | 11 refills | 30 days | Status: CP
Start: 2022-09-07 — End: 2023-09-07

## 2022-09-07 NOTE — Unmapped (Signed)
Addended by: Malva Limes on: 09/07/2022 02:48 PM     Modules accepted: Orders

## 2022-09-12 NOTE — Unmapped (Signed)
[facility] Refill Coordination Note    Specialty Medication(s) to be Shipped:   Transplant: cyclosporine 25mg     Other medication(s) to be shipped: No additional medications requested for fill at this time     Gina Hart, DOB: January 03, 1976  Phone: [phone] (home)       All above HIPAA information was verified with patient.     Was a Nurse, learning disability used for this call? No    Completed refill call assessment today to schedule patient's medication shipment from the [facility] [phone]).  All relevant notes have been reviewed.     Specialty medication(s) and dose(s) confirmed: Regimen is correct and unchanged.   Changes to medications: Gina Hart reports no changes at this time.  Changes to insurance: No  New side effects reported not previously addressed with a pharmacist or physician: None reported  Questions for the pharmacist: No    Confirmed patient received a Conservation officer, historic buildings and a Surveyor, mining with first shipment. The patient will receive a drug information handout for each medication shipped and additional FDA Medication Guides as required.       DISEASE/MEDICATION-SPECIFIC INFORMATION        N/A    SPECIALTY MEDICATION ADHERENCE     Medication Adherence    Patient reported X missed doses in the last month: 0  Specialty Medication: cyclosporine 25mg               Were doses missed due to medication being on hold? No    Cyclosporine 25mg   : 10 days of medicine on hand       REFERRAL TO PHARMACIST     Referral to the pharmacist: Not needed      [facility]     Shipping address confirmed in Epic.     Patient was notified of new phone menu : Yes    Delivery Scheduled: Yes, Expected medication delivery date: 09/19/2022.     Medication will be delivered via UPS to the prescription address in Epic WAM.    Thad Ranger, PharmD   [facility] Specialty Pharmacist

## 2022-09-18 MED FILL — CYCLOSPORINE MODIFIED 25 MG CAPSULE: ORAL | 30 days supply | Qty: 300 | Fill #2

## 2022-09-19 ENCOUNTER — Ambulatory Visit: Admit: 2022-09-19 | Discharge: 2022-09-20 | Payer: MEDICARE

## 2022-09-19 MED ADMIN — Fluorine F-18 FDG 4-40 mCi IV: 14.94 | INTRAVENOUS | @ 16:00:00 | Stop: 2022-09-19

## 2022-10-16 NOTE — Unmapped (Unsigned)
Lab request faxed to lab @ [phone]

## 2022-10-16 NOTE — Unmapped (Signed)
Lab orders faxed.

## 2022-10-18 NOTE — Unmapped (Signed)
Lab orders faxed.

## 2022-10-23 NOTE — Unmapped (Signed)
Received message from patient requesting return phone call.  No answer.  Left VM that TNC is returning call and provided call back number.

## 2022-10-23 NOTE — Unmapped (Signed)
[facility] Clinical Assessment & Refill Coordination Note    Gina Hart, DOB: [birth year]  Phone: [phone] (home)     All above HIPAA information was verified with patient.     Was a Nurse, learning disability used for this call? No    Specialty Medication(s):   Transplant: cyclosporine 25mg      Current Outpatient Medications   Medication Sig Dispense Refill    acetaminophen (TYLENOL) 325 MG tablet Take 1-2 tablets (325-650 mg total) by mouth every four (4) hours as needed for pain. 100 tablet 0    amlodipine (NORVASC) 2.5 MG tablet Take 3 tablets (7.5 mg total) by mouth daily. 90 tablet 11    aspirin (ECOTRIN) 81 MG tablet TAKE 1 TABLET (81 MG) BY MOUTH DAILY 30 tablet 0    atorvastatin (LIPITOR) 10 MG tablet Take 1 tablet (10 mg total) by mouth daily. 90 tablet 3    biotin 5 mg cap Take 1 capsule (5,000 mcg total) by mouth daily. 30 capsule 11    chlorthalidone (HYGROTON) 25 MG tablet Take 1 tablet (25 mg total) by mouth every morning. 30 tablet 11    cyclobenzaprine (FLEXERIL) 10 MG tablet Take 1 tablet (10 mg total) by mouth two (2) times a day as needed for muscle spasms. 45 tablet 0    cycloSPORINE modified (NEORAL) 25 MG capsule Take 5 capsules (125 mg total) by mouth two (2) times a day. 300 capsule 11    fluticasone propionate (FLONASE) 50 mcg/actuation nasal spray 1 spray by Each Nare route daily. 16 g 4    gabapentin (NEURONTIN) 100 MG capsule Take 2 capsules (200 mg total) by mouth Three (3) times a day. 180 capsule 11    hydrOXYzine (ATARAX) 25 MG tablet Take 1 tablet (25 mg total) by mouth nightly as needed. 30 tablet 3    loratadine (CLARITIN) 10 mg tablet Take 10 mg by mouth daily as needed for allergies.      melatonin 5 mg tablet Take 1 tablet (5 mg total) by mouth nightly as needed. 30 tablet 11    metoPROLOL tartrate (LOPRESSOR) 100 MG tablet Take 1 tablet (100 mg total) by mouth Two (2) times a day. 180 tablet 3    predniSONE (DELTASONE) 5 MG tablet Take 1 tablet (5 mg total) by mouth daily. 30 tablet 11    rOPINIRole (REQUIP) 0.25 MG tablet TAKE ONE TABLET BY MOUTH 1-3 HOURS BEFORE BEDTIME. 30 tablet 0    traZODone (DESYREL) 50 MG tablet Take 2 tablets (100 mg total) by mouth nightly. 60 tablet 11    WEGOVY 0.25 MG/0.5 ML SUBCUTANEOUS PEN INJECTOR Inject 0.25 mg under the skin every seven (7) days. 2 mL 11     No current facility-administered medications for this visit.        Changes to medications: Gina Hart reports no changes at this time.    Allergies   Allergen Reactions    Ibuprofen Swelling     Other reaction(s): SWELLING/EDEMA    Sulfa (Sulfonamide Antibiotics)        Changes to allergies: No    SPECIALTY MEDICATION ADHERENCE     Cyclosporine 25mg   : 5 days of medicine on hand       Medication Adherence    Patient reported X missed doses in the last month: 0  Specialty Medication: cycloSPORINE modified 25 MG capsule (NEORAL)  Patient is on additional specialty medications: No          Specialty medication(s) dose(s)  confirmed: Regimen is correct and unchanged.     Are there any concerns with adherence? No    Adherence counseling provided? Not needed    CLINICAL MANAGEMENT AND INTERVENTION      Clinical Benefit Assessment:    Do you feel the medicine is effective or helping your condition? Yes    Clinical Benefit counseling provided? Not needed    Adverse Effects Assessment:    Are you experiencing any side effects? No    Are you experiencing difficulty administering your medicine? No    Quality of Life Assessment:    Quality of Life    Rheumatology  Oncology  Dermatology  Cystic Fibrosis          How many days over the past month did your transplant  keep you from your normal activities? For example, brushing your teeth or getting up in the morning. 0    Have you discussed this with your provider? Not needed    Acute Infection Status:    Acute infections noted within Epic:  No active infections  Patient reported infection: None    Therapy Appropriateness:    Is therapy appropriate and patient progressing towards therapeutic goals? Yes, therapy is appropriate and should be continued    DISEASE/MEDICATION-SPECIFIC INFORMATION      N/A    Solid Organ Transplant: Not Applicable    PATIENT SPECIFIC NEEDS     Does the patient have any physical, cognitive, or cultural barriers? No    Is the patient high risk? No    Did the patient require a clinical intervention? No    Does the patient require physician intervention or other additional services (i.e., nutrition, smoking cessation, social work)? No    SOCIAL DETERMINANTS OF HEALTH     At the [facility], we have learned that life circumstances - like trouble affording food, housing, utilities, or transportation can affect the health of many of our patients.   That is why we wanted to ask: are you currently experiencing any life circumstances that are negatively impacting your health and/or quality of life? No    Social Determinants of Psychologist, prison and probation services Strain: Not on file   Internet Connectivity: Not on file   Food Insecurity: Not on file   Tobacco Use: Low Risk  (08/30/2022)    Patient History     Smoking Tobacco Use: Never     Smokeless Tobacco Use: Never     Passive Exposure: Not on file   Housing/Utilities: Not on file   Alcohol Use: Not on file   Transportation Needs: Not on file   Substance Use: Not on file   Health Literacy: Not on file   Physical Activity: Not on file   Interpersonal Safety: Not on file   Stress: Not on file   Intimate Partner Violence: Not on file   Depression: Not on file   Social Connections: Not on file       Would you be willing to receive help with any of the needs that you have identified today? Not applicable       SHIPPING     Specialty Medication(s) to be Shipped:   Transplant: cyclosporine 25mg     Other medication(s) to be shipped: No additional medications requested for fill at this time     Changes to insurance: No    Delivery Scheduled: Yes, Expected medication delivery date: 10/26/2022. Medication will be delivered via UPS to the confirmed prescription address in [place].  The patient will receive a drug information handout for each medication shipped and additional FDA Medication Guides as required.  Verified that patient has previously received a Conservation officer, historic buildings and a Surveyor, mining.    The patient or caregiver noted above participated in the development of this care plan and knows that they can request review of or adjustments to the care plan at any time.      All of the patient's questions and concerns have been addressed.    Thad Ranger, PharmD   [facility] Specialty Pharmacist

## 2022-10-24 DIAGNOSIS — Z940 Kidney transplant status: Principal | ICD-10-CM

## 2022-10-24 NOTE — Unmapped (Signed)
Returned patient's call.  She notes that since COVID in 2020, she has had ongoing chronic difficulty breathing out of her nose and loss of sense of taste and smell. She states that the resistance to airflow of her right nare has progressively gotten worse over time.  She says she was seen by ENT in 2021 about this and the possibility of surgical intervention to alleviate this was discussed. She would like to see them again for follow up and more serious consideration of this surgery.    Referral placed to ENT.  Patient aware and agreeable with this plan.

## 2022-10-26 MED FILL — CYCLOSPORINE MODIFIED 25 MG CAPSULE: ORAL | 30 days supply | Qty: 300 | Fill #3

## 2022-11-15 LAB — URINALYSIS WITH MICROSCOPY
BILIRUBIN UA: NEGATIVE
GLUCOSE UA: NEGATIVE
KETONES UA: NEGATIVE
LEUKOCYTE ESTERASE UA: NEGATIVE
NITRITE UA: NEGATIVE
PH UA: 6 (ref 4.5–8)
PROTEIN UA: NEGATIVE
SPECIFIC GRAVITY UA: 1.015 (ref 1.005–1.030)
UROBILINOGEN UA: 0.2

## 2022-11-15 LAB — CBC W/ DIFFERENTIAL
BASOPHILS ABSOLUTE COUNT: 0 10*3/uL — ABNORMAL LOW (ref 0.01–0.09)
BASOPHILS RELATIVE PERCENT: 0.4 % (ref 0.0–2.0)
EOSINOPHILS ABSOLUTE COUNT: 0.1 (ref 0.04–0.5)
EOSINOPHILS RELATIVE PERCENT: 3.6 % (ref 1.0–8.0)
HEMATOCRIT: 40.1 (ref 35–48)
HEMOGLOBIN: 13.5 (ref 11.9–16.0)
LYMPHOCYTES ABSOLUTE COUNT: 0.9 — ABNORMAL LOW (ref 1.0–2.7)
LYMPHOCYTES RELATIVE PERCENT: 24 % (ref 20–45)
MEAN CORPUSCULAR HEMOGLOBIN CONC: 33.7 (ref 33.3–35.5)
MEAN CORPUSCULAR HEMOGLOBIN: 29.2 pg (ref 25.8–33.0)
MEAN CORPUSCULAR VOLUME: 86.9 fl (ref 78–95)
MEAN PLATELET VOLUME: 8.1 fl (ref 7.5–11.2)
MONOCYTES ABSOLUTE COUNT: 0.6 (ref 0.2–0.8)
MONOCYTES RELATIVE PERCENT: 14.5 % — ABNORMAL HIGH (ref 4.0–12.0)
NEUTROPHILS ABSOLUTE COUNT: 2.3 (ref 1.7–6.7)
NEUTROPHILS RELATIVE PERCENT: 57.5 % (ref 42–74)
PLATELET COUNT: 252 (ref 150–480)
RED BLOOD CELL COUNT: 4.6 uL (ref 4.0–5.3)
RED CELL DISTRIBUTION WIDTH: 13.4 % (ref 12.1–15.0)
WHITE BLOOD CELL COUNT: 4 (ref 4.0–10.5)

## 2022-11-15 LAB — BASIC METABOLIC PANEL
ANION GAP: 9.6 mmol/L (ref 2.0–15.0)
BLOOD UREA NITROGEN: 25 — ABNORMAL HIGH (ref 7–18)
BUN / CREAT RATIO: 25 — ABNORMAL HIGH (ref 10.00–16.00)
CALCIUM: 9.7 (ref 8.5–10.1)
CHLORIDE: 100 mmol/L (ref 100–108)
CO2: 29.4 mmol/L (ref 21–32)
CREATININE: 1 (ref 0.6–1.3)
EGFR CKD-EPI AA FEMALE: 69
GLUCOSE RANDOM: 95 mg/dL (ref 65–99)
MAGNESIUM: 1.8 (ref 1.8–2.4)
OSMOLALITY CALCULATION: 292
PHOSPHORUS: 3.2 (ref 2.6–4.7)
POTASSIUM: 3.3 mmol/L — ABNORMAL LOW (ref 3.5–5.1)
SODIUM: 139 mmol/L (ref 136–145)

## 2022-11-15 LAB — LIPID PANEL
CHOLESTEROL: 196 (ref 50–200)
HDL CHOLESTEROL: 54 (ref 35–60)
LDL CHOLESTEROL CALCULATED: 108 (ref 5.0–130.0)
LDL/HDL RATIO: 3.629
TRIGLYCERIDES: 105 (ref 30–200)

## 2022-11-21 NOTE — Unmapped (Signed)
[facility] Refill Coordination Note    Specialty Medication(s) to be Shipped:   Transplant: cyclosporine 25mg     Other medication(s) to be shipped: No additional medications requested for fill at this time     Gina Hart, DOB: 08/08/1975  Phone: [phone] (home)       All above HIPAA information was verified with patient.     Was a Nurse, learning disability used for this call? No    Completed refill call assessment today to schedule patient's medication shipment from the [facility] [phone]).  All relevant notes have been reviewed.     Specialty medication(s) and dose(s) confirmed: Regimen is correct and unchanged.   Changes to medications: Cristela reports no changes at this time.  Changes to insurance: No  New side effects reported not previously addressed with a pharmacist or physician: None reported  Questions for the pharmacist: No    Confirmed patient received a Conservation officer, historic buildings and a Surveyor, mining with first shipment. The patient will receive a drug information handout for each medication shipped and additional FDA Medication Guides as required.       DISEASE/MEDICATION-SPECIFIC INFORMATION        N/A    SPECIALTY MEDICATION ADHERENCE     Medication Adherence    Patient reported X missed doses in the last month: 0  Specialty Medication: cycloSPORINE modified 25 MG capsule (NEORAL)  Patient is on additional specialty medications: No  Patient is on more than two specialty medications: No  Any gaps in refill history greater than 2 weeks in the last 3 months: no  Demonstrates understanding of importance of adherence: yes  Informant: patient  Reliability of informant: reliable  Provider-estimated medication adherence level: good  Patient is at risk for Non-Adherence: No  Reasons for non-adherence: no problems identified  Confirmed plan for next specialty medication refill: delivery by pharmacy  Refills needed for supportive medications: not needed          Refill Coordination    Has the Patients' Contact Information Changed: No  Is the Shipping Address Different: No         Were doses missed due to medication being on hold? No    cycloSPORINE modified 25 MG capsule (NEORAL)  : 7 days of medicine on hand       REFERRAL TO PHARMACIST     Referral to the pharmacist: Not needed      [facility]     Shipping address confirmed in Epic.       Delivery Scheduled: Yes, Expected medication delivery date: 05/31.     Medication will be delivered via UPS to the prescription address in Epic WAM.    Antonietta Barcelona   [facility] Specialty Technician

## 2022-11-23 LAB — CYCLOSPORINE LEVEL: CYCLOSPORINE (FPIA) BLOOD: 241

## 2022-11-23 MED FILL — CYCLOSPORINE MODIFIED 25 MG CAPSULE: ORAL | 30 days supply | Qty: 300 | Fill #4

## 2022-12-06 MED ORDER — MELATONIN 5 MG TABLET
ORAL_TABLET | Freq: Every evening | ORAL | 11 refills | 30 days | Status: CP | PRN
Start: 2022-12-06 — End: ?

## 2022-12-13 NOTE — Unmapped (Signed)
[facility] Refill Coordination Note    Specialty Medication(s) to be Shipped:   Transplant: None- patient denied cyclosporine today    Other medication(s) to be shipped: No additional medications requested for fill at this time     Gina Hart, DOB: [birth year]  Phone: [phone] (home)       All above HIPAA information was verified with patient.     Was a Nurse, learning disability used for this call? No    Completed refill call assessment today to schedule patient's medication shipment from the [facility] [phone]).  All relevant notes have been reviewed.     Specialty medication(s) and dose(s) confirmed: Regimen is correct and unchanged.   Changes to medications: Jessye reports no changes at this time.  Changes to insurance: No  New side effects reported not previously addressed with a pharmacist or physician: None reported  Questions for the pharmacist: No    Confirmed patient received a Conservation officer, historic buildings and a Surveyor, mining with first shipment. The patient will receive a drug information handout for each medication shipped and additional FDA Medication Guides as required.       DISEASE/MEDICATION-SPECIFIC INFORMATION        N/A    SPECIALTY MEDICATION ADHERENCE     Medication Adherence    Patient reported X missed doses in the last month: 0  Specialty Medication: Cyclosporine 25mg   Patient is on additional specialty medications: No              Were doses missed due to medication being on hold? No    Cyclosporine 25 mg: 18 days of medicine on hand     REFERRAL TO PHARMACIST     Referral to the pharmacist: Not needed      [facility]     Shipping address confirmed in Epic.       Delivery Scheduled: Patient declined refill at this time due to has more than 2 weeks on hand..     Medication will be delivered via UPS to the prescription address in Epic WAM.    Tera Helper, [facility]   [facility] Specialty Pharmacist

## 2022-12-18 IMAGING — MR MR HEAD W/O CM
11 series · 43 of 48 positions shown · non-contrast
Comparison: None.

CLINICAL DATA: Headaches and memory loss. Personal history of
lymphocytic leukemia.

EXAM:
MRI HEAD WITHOUT CONTRAST
TECHNIQUE: Multiplanar, multiecho pulse sequences of the brain and surrounding
structures were obtained without intravenous contrast.

[Series 5: ax dwi_tracew · axial · 3.0mm · 0.60mm/px · z∈[-89,+64]mm · 4 of 48 slices shown]
[im 1/48]
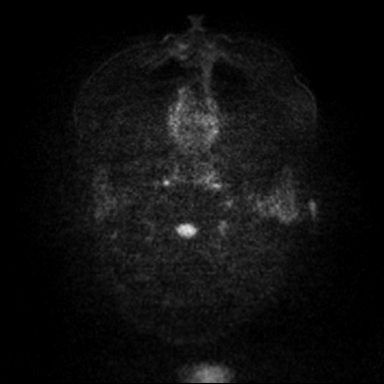
[im 16/48]
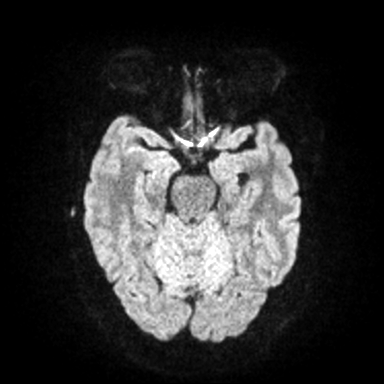
[im 32/48]
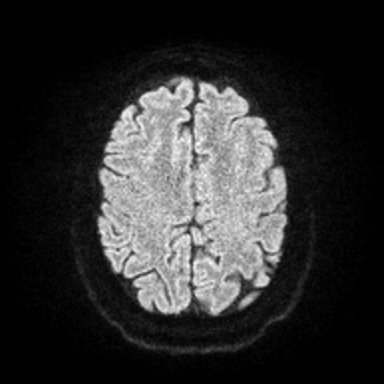
[im 48/48]
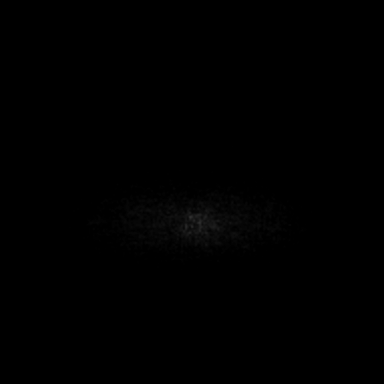

[Series 6: ax dwi_adc · axial · 3.0mm · 0.60mm/px · z∈[-89,+64]mm · 4 of 48 slices shown]
[im 1/48]
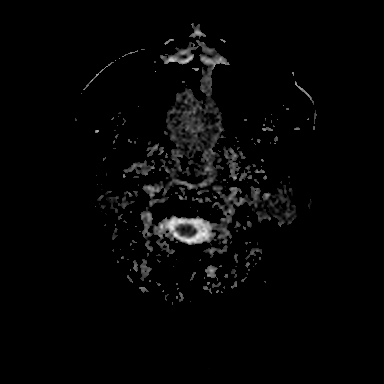
[im 16/48]
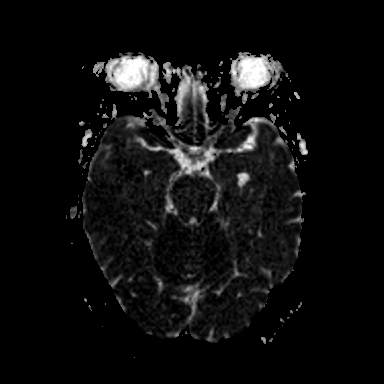
[im 32/48]
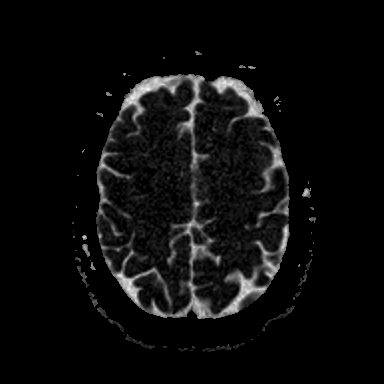
[im 48/48]
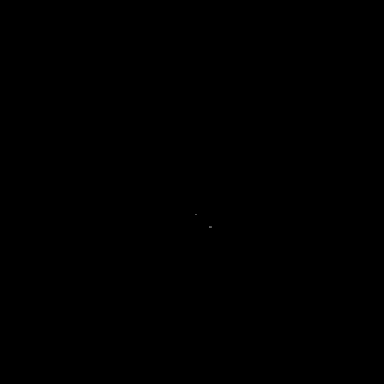

[Series 7: cor dwi_tracew · coronal · 5.0mm · 0.60mm/px · 3 of 40 slices shown]
[im 1/40]
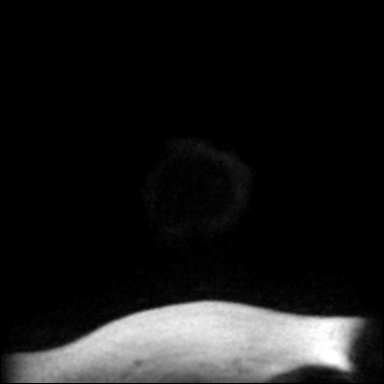
[im 20/40]
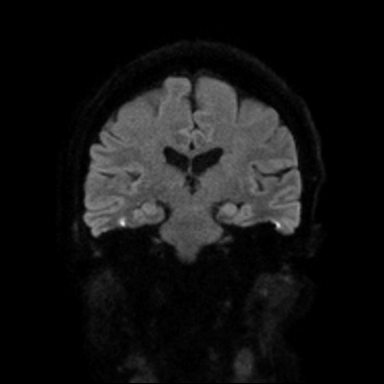
[im 40/40]
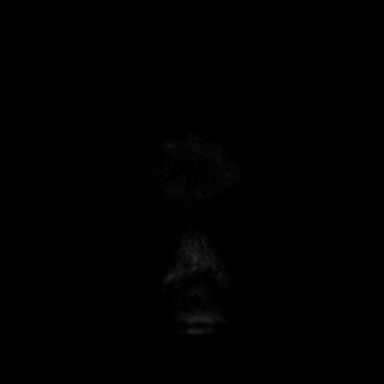

[Series 8: cor dwi_adc · coronal · 5.0mm · 0.60mm/px · 3 of 40 slices shown]
[im 1/40]
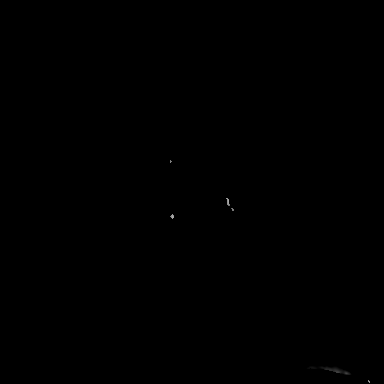
[im 20/40]
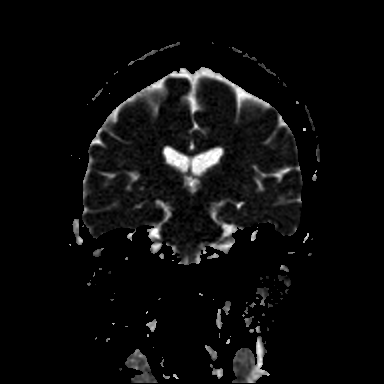
[im 40/40]
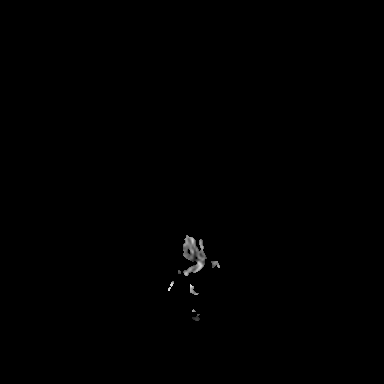

[Series 9: T1 · sagittal · 5.0mm · 0.62mm/px · 2 of 21 slices shown (1 of 2)]
[im 1/21]
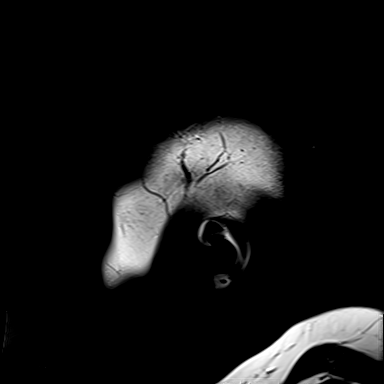
[im 21/21]
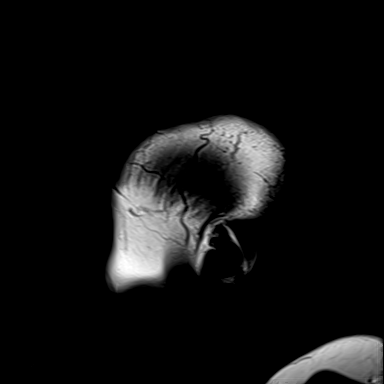

[Series 10: T2 · axial · 5.0mm · 0.53mm/px · z∈[-84,+58]mm · 2 of 25 slices shown (1 of 2)]
[im 1/25]
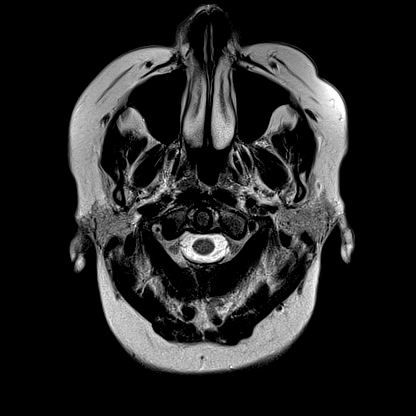
[im 25/25]
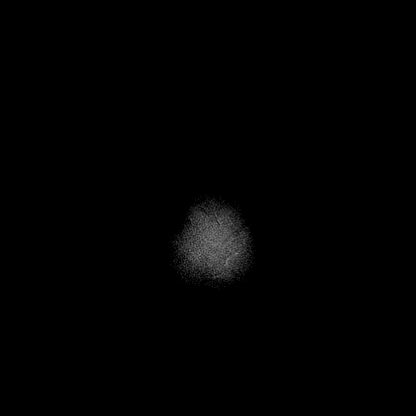

[Series 12: pha_images · axial · 3.0mm · 0.90mm/px · z∈[-100,+75]mm · 5 of 60 slices shown]
[im 1/60]
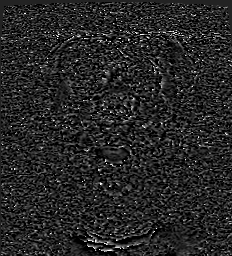
[im 15/60]
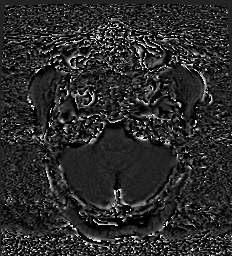
[im 30/60]
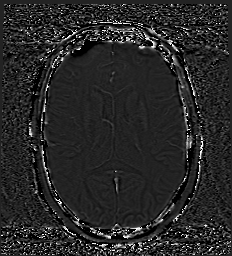
[im 45/60]
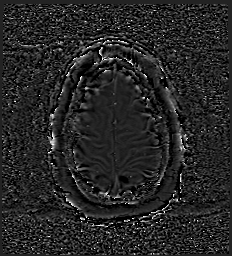
[im 60/60]
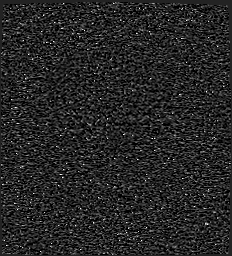

[Series 13: swi_images · axial · 3.0mm · 0.90mm/px · z∈[-100,+75]mm · 5 of 60 slices shown]
[im 1/60]
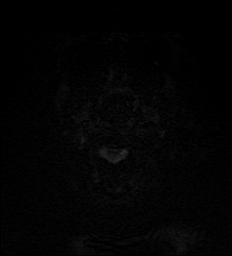
[im 15/60]
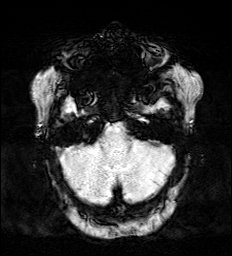
[im 30/60]
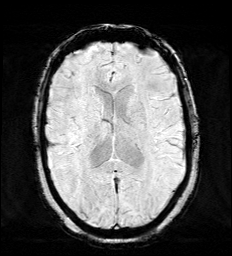
[im 45/60]
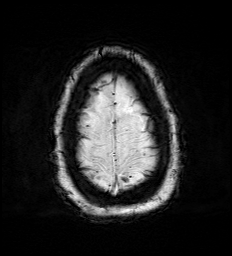
[im 60/60]
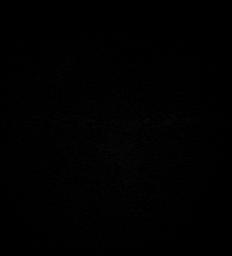

[Series 15: FLAIR · axial · 3.0mm · 0.53mm/px · z∈[-93,+67]mm · 4 of 55 slices shown]
[im 1/55]
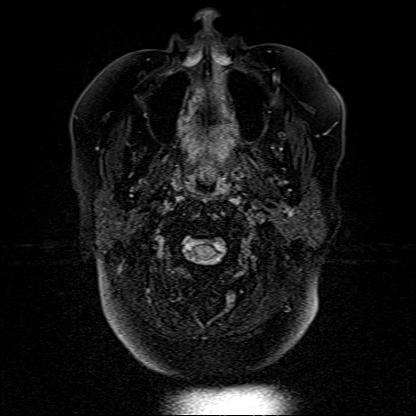
[im 19/55]
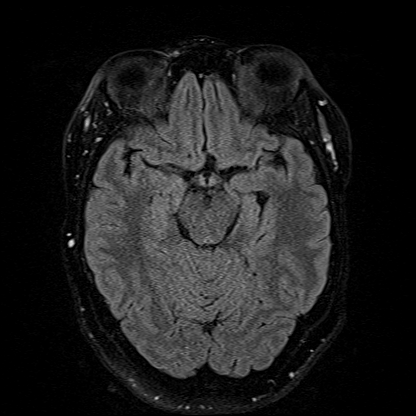
[im 37/55]
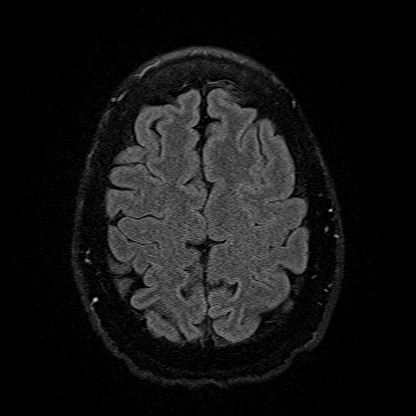
[im 55/55]
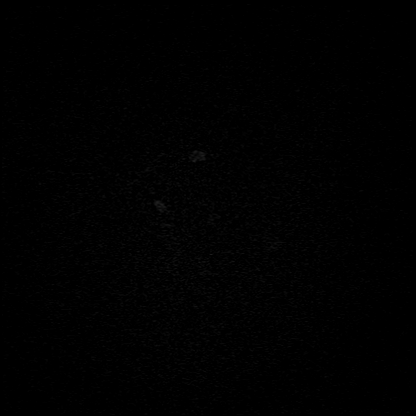

[Series 16: T1 · axial · 1.0mm · 0.98mm/px · z∈[-97,+76]mm · 9 of 176 slices shown (2 of 2)]
[im 1/176]
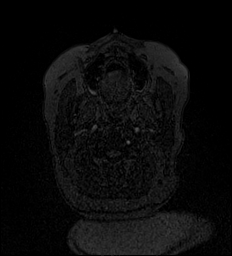
[im 14/176]
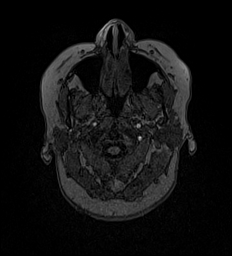
[im 27/176]
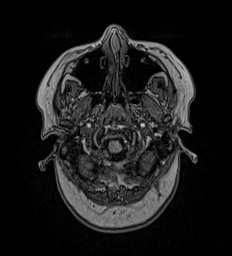
[im 54/176]
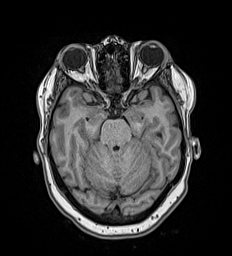
[im 81/176]
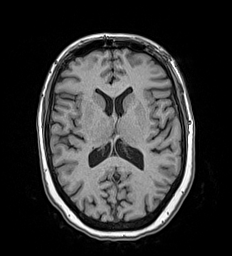
[im 95/176]
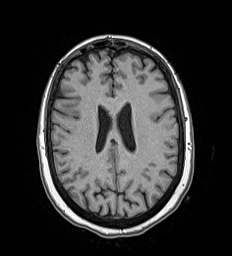
[im 122/176]
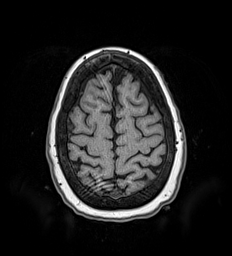
[im 149/176]
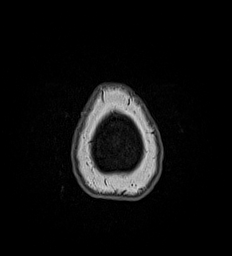
[im 176/176]
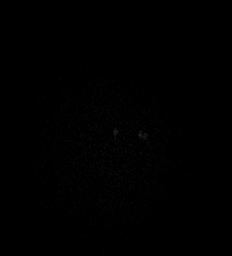

[Series 17: T2 · coronal · 5.0mm · 0.57mm/px · 2 of 29 slices shown (2 of 2)]
[im 1/29]
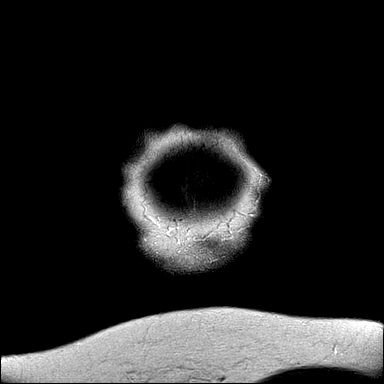
[im 29/29]
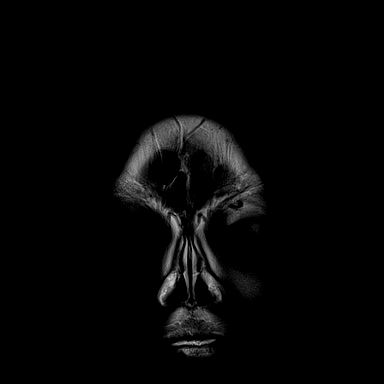

[43 of 48 positions shown; findings below may reference images not displayed]

FINDINGS: Brain: Minimal periventricular T2 hyperintensity is within normal
limits. No acute infarct, hemorrhage, or mass lesion is present.
White matter is otherwise unremarkable. No significant atrophy is
present. The ventricles are of normal size. No significant
extraaxial fluid collection is present. Temporal lobes are within
normal limits bilaterally. Hippocampal structures are unremarkable.

The internal auditory canals are within normal limits. The brainstem
and cerebellum are within normal limits.

Vascular: Flow is present in the major intracranial arteries.

Skull and upper cervical spine: The craniocervical junction is
normal. Upper cervical spine is within normal limits. Marrow signal
is unremarkable.

Sinuses/Orbits: The paranasal sinuses and mastoid air cells are
clear. The globes and orbits are within normal limits.

Other:
IMPRESSION: Negative MRI of the brain. No acute or focal lesion to explain the
patient's symptoms.

## 2022-12-18 MED ORDER — POTASSIUM CHLORIDE ER 20 MEQ TABLET,EXTENDED RELEASE(PART/CRYST)
ORAL_TABLET | Freq: Every day | ORAL | 11 refills | 30 days | Status: CP
Start: 2022-12-18 — End: 2023-12-18

## 2022-12-18 NOTE — Unmapped (Signed)
Received critical lab value potassium 3.0 from [facility] lab.  Primary TNC and Dr. Margaretmary Bayley notified.

## 2022-12-18 NOTE — Unmapped (Signed)
Called patient regarding low potassium level of 3. Per Dr Margaretmary Bayley, patient to begin Potassium Chloride supplements 20 mEq daily.  Patient verbalized understanding of this plan and agreeable with this.  Prescription sent to [facility].

## 2022-12-19 DIAGNOSIS — Z940 Kidney transplant status: Principal | ICD-10-CM

## 2022-12-19 MED ORDER — PREDNISONE 5 MG TABLET
ORAL_TABLET | Freq: Every day | ORAL | 11 refills | 30 days | Status: CP
Start: 2022-12-19 — End: 2023-12-19

## 2022-12-22 MED FILL — CYCLOSPORINE MODIFIED 25 MG CAPSULE: ORAL | 30 days supply | Qty: 300 | Fill #5

## 2022-12-27 NOTE — Unmapped (Signed)
Returned patient's call.  She endorses recent muscle cramping, specifically in her hands.  She states that it has resolved today.  Discussed with patient that this is usually caused by low Magnesium levels.  Encouraged increasing dietary intake of Magnesium as well as water intake and monitor for any future symptoms.  Also encouraged labs to follow up as last labs are from May.  Patient agreeable with this plan.

## 2023-01-13 NOTE — Unmapped (Incomplete)
Para Espanol: [phone]    For urgent nursing questions please call the triage nurse line at [phone] during business hours 8am - 5pm.  After hours, please call [phone] and ask for the ENT physician on call.  If you are concerned, please do not hesitate to seek medical care at your local emergency department.    SCHEDULING  For surgery scheduling please call Marsela at [phone].  For scheduling clinic appointments please call [phone].  For scheduling speech therapy appointments please call [phone].    ABOUT YOUR CARE  For Oncology related questions, please call [phone].  Hazel Hampton, Nurse Navigator for Drs. Hackman, Schrank, Sullivan, and Yarbrough.  Stacy Forrester, Nurse Navigator for Drs. Blumberg, Lumley, and Head and Neck Fellow    PAPERWORK/FORMS  Please include Name and Date of Birth on all faxes. FAX: [phone], Attn: Nurse Navigator.  Please allow 7-10 Business Days to complete any FMLA or Disability paperwork. Please include specific dates regarding time out of work.  Forms can not be returned by e-mail. Please include specific instructions regarding where completed forms should be returned.     IMAGING APPOINTMENTS   For appointments or questions about radiology appointments please call [phone];  Option 1 for CT, MRI, and ultrasounds.   Option 2 for PET scans.  Your ordering provider will contact you to discuss results.    MyChart Messages  For your safety and best care, please do not use MyChart messages for reporting symptoms or urgent messages.   Symptoms should be reported by calling the triage line above.   MyChart messages are checked only during weekday business hours.    Please use MyChart for non-urgent prescription refills, non-urgent scheduling issues, or non-urgent general questions/concerns.   Non-urgent concerns communicated via MyChart will be addressed within 24 business hours.   Please remember, clinic is only open from 8am-4:30pm and is closed over the weekend as well as major holidays.   MyChart messages will not be seen or answered during the times clinic is closed.  Note that MyChart messages may be routed a central pool. A team member of your provider will get back to you as quickly as possible.     Tryphena Perkovich, LPN supporting providers;  Trevor Hackman, MD.    Catherine Lumley, MD.  Travis Schrank, MD.

## 2023-01-15 DIAGNOSIS — Z940 Kidney transplant status: Principal | ICD-10-CM

## 2023-01-15 NOTE — Unmapped (Unsigned)
Otolaryngology-Head and Neck Surgery Consult Note    Date of Service:  July 09, 2019 ***    Assessment:  No diagnosis found.      47 y.o. female with outside PET imaging that showed hypermetabolic activity in thickened lingual tonsils. Imaging was not available for my review. I have requested for imaging to be sent here at [facility] for a formal radiological read. FFL showed enlarged, but benign appearing, lingual tonsils bilaterally.No obvious masses.     Plan: ***  - Without outside PET imaging from 06/25/19 for my official review, we discussed a differential diagnosis of enlarged lingual tissue as posible inflammation. Will request the PET imaging here at [facility]  - Patient had congested nasal mucosa and enlarged IT on exam today. She does endorse mouth breathing and symptoms of nasal congestion.  Recommended that she use nasal sprays (flonase) QD for 1 month. Discussed daily and proper use of Flonase with her.  If symptoms persist, we discussed the possible need for a turbinate reduction in the future.   - Patient reports a prior history of sleep apnea. She does snore and does have episodes gasping for air. There is a question on physical exam whether or not the patient has had a UPPP in the past but the patient does not remember if shes had one or not. The patient is not currently using a CPAP machine and does not follow with anyone for her reported OSA. I recommended that she get a repeat sleep study and follow up with sleep medicine for her possible OSA.   - Follow up with me PRN  - The patient will call with any questions or concerns.     No chief complaint on file.      History of Present Illness:  ***    Gina Hart is a 47 y.o. African-American pleasant female being seen in consultation at the request of Dr. Margaretmary Bayley for evaluation and opinion of PET CT results from 06/25/19 which showed symmetric intense hypermetabolic activity in thickened lingual tonsils. Patient has a PMH of a right kidney transplant on 01/11/17 which is currently being managed by her nephrologist Dr. Margaretmary Bayley. Her last visit to nephrology on 06/05/19 she c/o nasal and sinus congestion persistent for 1 month prior. She further stated that she had no sense of taste or smell. COVID-19 test results were negative as of 06/10/19.  Dr. Margaretmary Bayley discussed abnormal lab findings regarding EBV viral load with the patient and planned for a PET CT which was performed on 06/25/19 at the medical mall at [facility]. Results showed symmetric intense hypermetabolic activity in thickened lingual tonsils.     Today, the patient presents to clinic and endorses a sore throat with loss of hearing in her left ear that began a few days before her recent PET scan. This has since resolved. When she wakes in the morning, she notices a change in color of phlegm that she clears from her nose/throat. She mentioned that she as had a sleep study in the past which reported that she has sleep apnea but does not use a CPAP machine. Furthermore, she mentioned that she snores which wakes herself up sometimes. She had a negative COVID test but endorses loss of smell/taste. Denies fever, dysphagia. Denies smoking/alcohol usage.     Modifying factors:  none  Other associated symptoms: Change in color of phlegm that she clears from her nose/throat in the morning.  Severity is rated as mild    The patient denies fevers,  weight loss, SOB, pain, dysphagia, odynophagia, hoarseness, hemoptysis, otalgia, or neck masses/growths.      Past Medical History    Tonsillectomy as a teenager    Past Medical History:   Diagnosis Date    ESRD (end stage renal disease) (CMS-HCC)     Focal segmental glomerulosclerosis 1998    Hypertension     Pulmonary hypertension (CMS-HCC)     Scoliosis (and kyphoscoliosis), idiopathic          Past Surgical History    Past Surgical History:   Procedure Laterality Date    BACK SURGERY      CHG US GUIDE, VASCULAR ACCESS Right 07/11/2013    Procedure: ULTRASOUND GUIDANCE FOR VASC ACCESS REQUIRING [place] EVAL OF POTENTIAL ACCESS SITES;  Surgeon: Alben Deeds, MD;  Location: MAIN OR [facility];  Service: Vascular    PR AV ANAST,FOREARM VEIN TRANSPOSITION Right 07/11/2013    Procedure: R. UE BRACHIOCEPHALIC FISTULA;  Surgeon: Alben Deeds, MD;  Location: MAIN OR [facility];  Service: Vascular    PR AV ANAST,UP ARM BASILIC VEIN TRANSPOSIT Right 07/11/2013    Procedure: BASILIC VEIN TRANSPOSITION;  Surgeon: Alben Deeds, MD;  Location: MAIN OR [facility];  Service: Vascular    PR RIGHT HEART CATH O2 SATURATION & CARDIAC OUTPUT N/A 06/16/2015    Procedure: Right Heart Catheterization;  Surgeon: Alvira Philips, MD;  Location: [facility];  Service: Cardiology    PR TRANSPLANTATION OF KIDNEY Right 01/11/2017    Procedure: RENAL ALLOTRANSPLANTATION, IMPLANTATION OF GRAFT; WITHOUT RECIPIENT NEPHRECTOMY;  Surgeon: Leona Carry, MD;  Location: MAIN OR [facility];  Service: Transplant         Medications      Current Outpatient Medications   Medication Sig Dispense Refill    acetaminophen (TYLENOL) 325 MG tablet Take 1-2 tablets (325-650 mg total) by mouth every four (4) hours as needed for pain. 100 tablet 0    amlodipine (NORVASC) 2.5 MG tablet Take 3 tablets (7.5 mg total) by mouth daily. 90 tablet 11    aspirin (ECOTRIN) 81 MG tablet TAKE 1 TABLET (81 MG) BY MOUTH DAILY 30 tablet 0    atorvastatin (LIPITOR) 10 MG tablet Take 1 tablet (10 mg total) by mouth daily. 90 tablet 3    biotin 5 mg cap Take 1 capsule (5,000 mcg total) by mouth daily. 30 capsule 11    chlorthalidone (HYGROTON) 25 MG tablet Take 1 tablet (25 mg total) by mouth every morning. 30 tablet 11    cyclobenzaprine (FLEXERIL) 10 MG tablet Take 1 tablet (10 mg total) by mouth two (2) times a day as needed for muscle spasms. 45 tablet 0    cycloSPORINE modified (NEORAL) 25 MG capsule Take 5 capsules (125 mg total) by mouth two (2) times a day. 300 capsule 11    fluticasone propionate (FLONASE) 50 mcg/actuation nasal spray 1 spray by Each Nare route daily. 16 g 4    gabapentin (NEURONTIN) 100 MG capsule Take 2 capsules (200 mg total) by mouth Three (3) times a day. 180 capsule 11    hydrOXYzine (ATARAX) 25 MG tablet Take 1 tablet (25 mg total) by mouth nightly as needed. 30 tablet 3    loratadine (CLARITIN) 10 mg tablet Take 10 mg by mouth daily as needed for allergies.      melatonin 5 mg tablet Take 1 tablet (5 mg total) by mouth nightly as needed. 30 tablet 11    metoPROLOL tartrate (LOPRESSOR) 100 MG tablet Take 1 tablet (100  mg total) by mouth Two (2) times a day. 180 tablet 3    potassium chloride 20 MEQ ER tablet Take 1 tablet (20 mEq total) by mouth daily. 30 tablet 11    predniSONE (DELTASONE) 5 MG tablet Take 1 tablet (5 mg total) by mouth daily. 30 tablet 11    rOPINIRole (REQUIP) 0.25 MG tablet TAKE ONE TABLET BY MOUTH 1-3 HOURS BEFORE BEDTIME. 30 tablet 0    traZODone (DESYREL) 50 MG tablet Take 2 tablets (100 mg total) by mouth nightly. 60 tablet 11    WEGOVY 0.25 MG/0.5 ML SUBCUTANEOUS PEN INJECTOR Inject 0.25 mg under the skin every seven (7) days. 2 mL 11     No current facility-administered medications for this visit.         Allergies    Ibuprofen and Sulfa (sulfonamide antibiotics)      Social History:    Tobacco use:  reports that she has never smoked. She has never used smokeless tobacco.  Alcohol use:  reports no history of alcohol use.  Drug use:  reports no history of drug use.      Family History    The patient's family history includes Hypertension in her mother; No Known Problems in her daughter, father, maternal grandfather, maternal grandmother, paternal grandfather, paternal grandmother, and sister..      Review of Systems  A 10 system review of systems was negative except as noted in HPI and intake encounter form, which was reviewed and scanned into the media section of the medical record      Objective     Vital Signs  There were no vitals taken for this visit.      Physical Exam    Vitals signs reviewed in the nursing chart  General:  WD, WN, well groomed, pleasant female, sitting up in NAD, normal appearance and voice, communicates appropriately, no stridor.  Psychiatric/Neuro:  A&Ox3, Normal mood and affect, Following commands, MAE. Cranial nerves 2-12 intact, No focal deficits.   Head and Face:  AT, [place], no asymmetry, Skin with no masses or lesions, sinuses nontender to palpation.  Parotid and submandibular glands normal bilaterally.  Facial strength normal bilaterally.   Eyes:  EOM Intact, sclera anicteric, no conjunctival injection, PERRL.   Nose:  Normal external nasal pyramid, anterior rhinoscopy shows normal  mucosa, septum, and turbinates.   Ears:  Normal auricles, EAC's, and TM's to otoscopy.  TM's intact and mobile, Middle ears aerated bilaterally.  Hearing:  Normal hearing to whispered voice.  Oral Cavity:   Normal lips, gingiva, tongue, hard palate, and dentition; pink, moist mucosa, no lesions, tongue is mobile.  Floor of mouth is soft.  Oropharynx:  Congestion of inferior turbinate L>R. Surgical absence of uvula and tonsils. No masses palpated. No lesions. BOT soft, soft palate and posterior pharynx, symmetric pink, moist mucosa  Neck:  No masses, no thyroid nodules, no thyromegaly, trachea midline. Normal laryngeal crepitus.  Lymphatics:  No cervical lymphadenopathy.  Respiratory:  Normal respiratory effort, symmetric chest rise, no accessory muscle usage.  Cardiovascular:  No clubbing, cyanosis, swelling, edema or varicosities in extremities, Regular Rate.      Laryngoscopy Procedure Note    Pre-operative Diagnosis:   No diagnosis found.    Post-operative Diagnosis: same    Anesthesia: none    Surgeon: Epimenio Sarin. Teondre Jarosz    Endoscopy Type:  Flexible Fiberoptic Laryngoscopy    Indications: To better evaluate the patient???s tumor site    Procedure Details:    The  patient was placed in the sitting position.  After topical anesthesia and decongestion, the 4 mm laryngoscope was passed.  The nasal cavities, nasopharynx, oropharynx, hypopharynx, and larynx were all examined.  Vocal cords were examined during respiration and phonation.  The following findings were noted:    Findings:  Nasal cavities:  Enlarged inferior turbinates L>R. Normal mucosa, patent, no masses, lesions, and septum. Congested nasal mucosa.  Nasopharynx:  Normal pink moist mucosa, normal eustachian tubes, no masses.  Oropharynx:  Symmericly enlarged lingual tonsils. Tonsils filling the vallecula and slightly causing a retroflexed epiglottis. No obvious masses. Normal palate, pharyngeal wall, and base of tongue, mucosa pink and moist  Hypopharynx:  Normal pharyngeal walls and pyriform sinuses, no pooling of secretions  Larynx:  Supraglottis, false and true vocal cord are normal.  Vocal cord mobility is normal.  Subglottis is patent.    Condition:  Stable.  Patient tolerated procedure well.    Complications:  None                        Test Results    none      Imaging    I have personally reviewed all pertinent imaging results.    06/25/19 PET CT Skull Base to Thigh:  IMPRESSION:  1. No hypermetabolic or enlarged lymph nodes skull base to thigh by FDG PET scan  2. Symmetric intense hypermetabolic in thickened lingual tonsils. Recommend clinical correlation  3. Normal spleen and marrow  4. Renal transplant in the right iliac fossa

## 2023-01-22 DIAGNOSIS — N3000 Acute cystitis without hematuria: Principal | ICD-10-CM

## 2023-01-22 MED ORDER — FLUCONAZOLE 150 MG TABLET
ORAL_TABLET | Freq: Once | ORAL | 0 refills | 1 days | Status: CP
Start: 2023-01-22 — End: 2023-01-22

## 2023-01-22 MED ORDER — AMOXICILLIN 875 MG-POTASSIUM CLAVULANATE 125 MG TABLET
ORAL_TABLET | Freq: Two times a day (BID) | ORAL | 0 refills | 10 days | Status: CP
Start: 2023-01-22 — End: 2023-02-01

## 2023-01-22 NOTE — Unmapped (Signed)
Received call from patient that she had labs drawn locally last week, but has not yet seen results.  Spoke with [facility] lab who will resend those results.  Patient started on antibiotic for positive urine culture per Dr Margaretmary Bayley.  She also discussed being concerned that her Magnesium could be low and could be contributing to headaches she has been having.  Reviewed patient's last Magnesium level of 1.8 and discussed we would see what results were today.  Also notified her to reach out to [facility] if she needs referral to local ENT.  Patient verbalized understanding of this and has no further questions at this time.

## 2023-01-23 MED ORDER — MAGNESIUM OXIDE 400 MG (241.3 MG MAGNESIUM) TABLET
ORAL_TABLET | Freq: Every day | ORAL | 11 refills | 30 days | Status: CP
Start: 2023-01-23 — End: 2024-01-23

## 2023-01-23 NOTE — Unmapped (Signed)
Called patient regarding Magnesium of 1.5.  She reports headaches at this time.  Per Dr Christena Deem, ok to start Magnesium Oxide BID.  Prescription sent to [facility] per patient's request.

## 2023-01-24 NOTE — Unmapped (Signed)
[facility] Refill Coordination Note    Specialty Medication(s) to be Shipped:   Transplant: cyclosporine 25mg     Other medication(s) to be shipped: No additional medications requested for fill at this time     Gina Hart, DOB: 04-25-1976  Phone: [phone] (home)       All above HIPAA information was verified with patient.     Was a Nurse, learning disability used for this call? No    Completed refill call assessment today to schedule patient's medication shipment from the [facility] [phone]).  All relevant notes have been reviewed.     Specialty medication(s) and dose(s) confirmed: Regimen is correct and unchanged.   Changes to medications: Adelisa reports no changes at this time.  Changes to insurance: No  New side effects reported not previously addressed with a pharmacist or physician: None reported  Questions for the pharmacist: No    Confirmed patient received a Conservation officer, historic buildings and a Surveyor, mining with first shipment. The patient will receive a drug information handout for each medication shipped and additional FDA Medication Guides as required.       DISEASE/MEDICATION-SPECIFIC INFORMATION        N/A    SPECIALTY MEDICATION ADHERENCE     Medication Adherence    Patient reported X missed doses in the last month: 0  Specialty Medication: cycloSPORINE modified 25 MG capsule (NEORAL)  Patient is on additional specialty medications: No              Were doses missed due to medication being on hold? No    cyclosporine 25 mg: 3 days of medicine on hand       REFERRAL TO PHARMACIST     Referral to the pharmacist: Not needed      [facility]     Shipping address confirmed in Epic.       Delivery Scheduled: Yes, Expected medication delivery date: 01/26/23.     Medication will be delivered via UPS to the prescription address in Epic WAM.    Gina Hart   [facility] Specialty Technician

## 2023-01-25 MED FILL — CYCLOSPORINE MODIFIED 25 MG CAPSULE: ORAL | 30 days supply | Qty: 300 | Fill #6

## 2023-02-05 DIAGNOSIS — I10 Essential (primary) hypertension: Principal | ICD-10-CM

## 2023-02-05 DIAGNOSIS — Z940 Kidney transplant status: Principal | ICD-10-CM

## 2023-02-05 MED ORDER — METOPROLOL TARTRATE 100 MG TABLET
ORAL_TABLET | Freq: Two times a day (BID) | ORAL | 3 refills | 90 days | Status: CP
Start: 2023-02-05 — End: ?

## 2023-02-12 NOTE — Unmapped (Signed)
Received message from patient that she would like to stop taking potassium pills.  She states she has currently stopped taking because they make her choke.  She plans to repeat labs Wednesday.  Attempted to return patient's call to discuss.  No answer and voicemail full.

## 2023-02-14 NOTE — Unmapped (Signed)
[name] Refill Coordination Note    Specialty Medication(s) to be Shipped:   Transplant: cyclosporine 25mg     Other medication(s) to be shipped: No additional medications requested for fill at this time     Gina Hart, DOB: 03-02-1976  Phone: [phone] (home)       All above HIPAA information was verified with patient.     Was a Nurse, learning disability used for this call? No    Completed refill call assessment today to schedule patient's medication shipment from the [facility] [phone]).  All relevant notes have been reviewed.     Specialty medication(s) and dose(s) confirmed: Regimen is correct and unchanged.   Changes to medications: Gina Hart reports no changes at this time.  Changes to insurance: No  New side effects reported not previously addressed with a pharmacist or physician: None reported  Questions for the pharmacist: No    Confirmed patient received a Conservation officer, historic buildings and a Surveyor, mining with first shipment. The patient will receive a drug information handout for each medication shipped and additional FDA Medication Guides as required.       DISEASE/MEDICATION-SPECIFIC INFORMATION        N/A    SPECIALTY MEDICATION ADHERENCE     Medication Adherence    Patient reported X missed doses in the last month: 0  Specialty Medication: cyclosporine 25mg   Patient is on additional specialty medications: No              Were doses missed due to medication being on hold? No    Cyclosporine 25mg   : 12 days of medicine on hand       REFERRAL TO PHARMACIST     Referral to the pharmacist: Not needed      [facility]     Shipping address confirmed in Epic.       Delivery Scheduled: Yes, Expected medication delivery date: 02/23/2023.     Medication will be delivered via UPS to the prescription address in Epic WAM.    Thad Ranger, PharmD   [facility] Specialty Pharmacist

## 2023-02-14 NOTE — Unmapped (Signed)
Spoke with patient about her potassium supplement.  She states she has not been taking potassium supplement due to pills being hard to swallow and feeling like she is gagging or choking on them.  She plans to repeat labs tomorrow.  Advised to boost dietary potassium for now and plan to follow up on labs tomorrow.

## 2023-02-15 LAB — CBC W/ DIFFERENTIAL
BASOPHILS ABSOLUTE COUNT: 0 10*3/uL — ABNORMAL LOW
BASOPHILS RELATIVE PERCENT: 0.9 % (ref 0.0–2.0)
EOSINOPHILS ABSOLUTE COUNT: 0.1 (ref 0.04–0.5)
EOSINOPHILS RELATIVE PERCENT: 3 % (ref 1.0–8.0)
HEMATOCRIT: 38.9
HEMOGLOBIN: 13.4 (ref 11.9–16.0)
LYMPHOCYTES ABSOLUTE COUNT: 1 % (ref 1.0–2.7)
LYMPHOCYTES RELATIVE PERCENT: 22.7 % (ref 20–45)
MEAN CORPUSCULAR HEMOGLOBIN CONC: 34.5 (ref 33.3–35.5)
MEAN CORPUSCULAR HEMOGLOBIN: 29.9 pg (ref 25.8–33.0)
MEAN CORPUSCULAR VOLUME: 86.5 fl (ref 78–95)
MEAN PLATELET VOLUME: 8.1 fl (ref 7.5–11.2)
MONOCYTES ABSOLUTE COUNT: 0.7 (ref 0.2–0.8)
MONOCYTES RELATIVE PERCENT: 15.5 % — ABNORMAL HIGH (ref 4.0–12.0)
NEUTROPHILS ABSOLUTE COUNT: 2.7 (ref 1.7–6.7)
NEUTROPHILS RELATIVE PERCENT: 57.9 % (ref 42–74)
PLATELET COUNT: 255 uL (ref 150–480)
RED BLOOD CELL COUNT: 4.5 10*6/uL (ref 4.0–5.3)
RED CELL DISTRIBUTION WIDTH: 13.6 % (ref 12.1–15.0)
WHITE BLOOD CELL COUNT: 4.6 uL (ref 4.0–10.5)

## 2023-02-15 LAB — LIPID PANEL
CHOLESTEROL: 170 mg/dL
HDL CHOLESTEROL: 59 mg/dL
LDL CHOLESTEROL CALCULATED: 87 mg/dL
LDL/HDL RATIO: 2.881
TRIGLYCERIDES: 94

## 2023-02-15 LAB — URINALYSIS WITH MICROSCOPY WITH CULTURE REFLEX
BILIRUBIN UA: NEGATIVE
BLOOD UA: NEGATIVE
GLUCOSE UA: NEGATIVE
KETONES UA: NEGATIVE
LEUKOCYTE ESTERASE UA: NEGATIVE
NITRITE UA: NEGATIVE
PH UA: 6 (ref 4.5–8)
PROTEIN UA: NEGATIVE
SPECIFIC GRAVITY UA: 1.01 (ref 1.005–1.030)
UROBILINOGEN UA: 0.2

## 2023-02-15 LAB — BASIC METABOLIC PANEL
ANION GAP: 9.2
BLOOD UREA NITROGEN: 27 — ABNORMAL HIGH (ref 7–18)
BUN / CREAT RATIO: 22.5 — ABNORMAL HIGH (ref 10.00–16)
CALCIUM: 9.8 mg/dL
CHLORIDE: 99 (ref 9–100)
CO2: 31.8 (ref 21–32)
CREATININE: 1.2 mg/dL (ref 0.6–1.3)
EGFR CKD-EPI AA FEMALE: 56
GLUCOSE RANDOM: 98 mg/dL
MAGNESIUM: 1.5 mg/dL — ABNORMAL LOW (ref 1.8–2.4)
OSMOLALITY CALCULATION: 295
PHOSPHORUS: 3.2 mg/dL
POTASSIUM: 3 mmol/L — ABNORMAL LOW (ref 3.5–5.1)
SODIUM: 140 (ref 13.6–145)

## 2023-02-15 NOTE — Unmapped (Addendum)
Received call from [facility] regarding critical lab value of 3.  Called patient to advised she will need to continue to take potassium due to this low level.  Advised cutting pills in half to see if this helps and that she will need to restart taking right away.  Dr Margaretmary Bayley and Dr Christena Deem updated as well and agreeable with this plan.    Also advised to increase dietary potassium and repeat labs next week.

## 2023-02-16 NOTE — Unmapped (Signed)
Spoke with patient who is taking PO potassium and has increased in her diet.  She notes feeling a little drained when she woke up this morning, but is feeling better now.  Advised patient to continue to monitor symptoms today and over the weekend and continue to take potassium and eat potassium rich foods. Advised her to report to Urgent Care if symptoms do not continue to improve.  Advised to repeat labs Monday.  Order faxed to lab and sent to patient via my chart.

## 2023-02-19 LAB — CYCLOSPORINE LEVEL: CYCLOSPORINE (FPIA) BLOOD: 217 ng/mL (ref 100–400)

## 2023-02-22 MED FILL — CYCLOSPORINE MODIFIED 25 MG CAPSULE: ORAL | 30 days supply | Qty: 300 | Fill #7

## 2023-03-08 ENCOUNTER — Other Ambulatory Visit: Payer: Self-pay | Admitting: Family Medicine

## 2023-03-08 DIAGNOSIS — Z1231 Encounter for screening mammogram for malignant neoplasm of breast: Secondary | ICD-10-CM

## 2023-03-21 NOTE — Unmapped (Signed)
Returned patient's call.  She would like to know if she can take B12 gummies.  Advised patient this is ok to take.  No further questions at this time.

## 2023-03-27 LAB — URINALYSIS WITH MICROSCOPY WITH CULTURE REFLEX
BILIRUBIN UA: NEGATIVE
BLOOD UA: NEGATIVE
GLUCOSE UA: NEGATIVE
KETONES UA: NEGATIVE
LEUKOCYTE ESTERASE UA: NEGATIVE
NITRITE UA: NEGATIVE
PH UA: 6.5 (ref 4.5–8)
PROTEIN UA: NEGATIVE
SPECIFIC GRAVITY UA: 1.015 (ref 1.005–1.030)
UROBILINOGEN UA: 0.2

## 2023-03-27 LAB — BASIC METABOLIC PANEL
EGFR CKD-EPI AA FEMALE: 62
MAGNESIUM: 1.7 — ABNORMAL LOW (ref 1.8–2.4)
OSMOLALITY CALCULATION: 291
PHOSPHORUS: 3.1 (ref 2.6–4.7)

## 2023-03-27 LAB — CBC W/ DIFFERENTIAL
BASOPHILS ABSOLUTE COUNT: 0.1 10*3/uL — ABNORMAL HIGH (ref 0.01–0.09)
BASOPHILS RELATIVE PERCENT: 0.8 % (ref 0.0–2.0)
EOSINOPHILS ABSOLUTE COUNT: 0.1 (ref 0.04–0.5)
EOSINOPHILS RELATIVE PERCENT: 1.2 % (ref 1.0–8.0)
HEMATOCRIT: 40.1
HEMOGLOBIN: 13.5 (ref 11.9–16.0)
LYMPHOCYTES ABSOLUTE COUNT: 2 (ref 1.0–2.7)
LYMPHOCYTES RELATIVE PERCENT: 23.1 % (ref 20–45)
MEAN CORPUSCULAR HEMOGLOBIN CONC: 33.7 (ref 33.3–35.5)
MEAN CORPUSCULAR HEMOGLOBIN: 29.7 pg (ref 25.8–33.0)
MEAN CORPUSCULAR VOLUME: 88.1 fl (ref 78–95)
MEAN PLATELET VOLUME: 8.1 fl (ref 7.5–11.2)
MONOCYTES ABSOLUTE COUNT: 1.1 uL — ABNORMAL HIGH (ref 0.2–0.8)
MONOCYTES RELATIVE PERCENT: 12.1 % — ABNORMAL HIGH (ref 4.0–12.0)
NEUTROPHILS ABSOLUTE COUNT: 5.6
NEUTROPHILS RELATIVE PERCENT: 62.8 % (ref 42–74)
PLATELET COUNT: 257 uL (ref 150–480)
RED BLOOD CELL COUNT: 4.6 10*6/uL (ref 4.0–5.3)
RED CELL DISTRIBUTION WIDTH: 13.5 % (ref 12.1–15.0)
WHITE BLOOD CELL COUNT: 8.9

## 2023-03-27 LAB — LIPID PANEL
CHOLESTEROL: 207 — ABNORMAL HIGH (ref 50–200)
HDL CHOLESTEROL: 81 — ABNORMAL HIGH (ref 35–60)
LDL CHOLESTEROL CALCULATED: 100 (ref 5.0–130.0)
LDL/HDL RATIO: 2.555
TRIGLYCERIDES: 83 (ref 30–200)

## 2023-03-27 LAB — RENAL FUNCTION PANEL
ANION GAP: 9.7
BLOOD UREA NITROGEN: 25 — ABNORMAL HIGH
BUN / CREAT RATIO: 22.7 — ABNORMAL HIGH (ref 0.6–1.3)
CALCIUM: 10.2 — ABNORMAL HIGH
CHLORIDE: 100
CO2: 29.3
CREATININE: 1.1
GLUCOSE RANDOM: 88 mg/dL (ref 65–99)
POTASSIUM: 3.3 — ABNORMAL LOW
SODIUM: 139

## 2023-03-29 NOTE — Unmapped (Signed)
[facility] Delivery Pharm[facility] Refill Coordination Note    Specialty Medication(s) to be Shipped:   Transplant: cyclosporine 25 mg    Other medication(s) to be shipped: No additional medications requested for fill at this time     Gina Hart, DOB: 10/31/1975  Phone: [phone] (home)       All above HIPAA information was verified with patient.     Was a Nurse, learning disability used for this call? No    Completed refill call assessment today to schedule patient's medication shipment from the [facility]  [phone]).  All relevant notes have been reviewed.     Specialty medication(s) and dose(s) confirmed: Regimen is correct and unchanged.   Changes to medications: Gina Hart reports no changes at this time.  Changes to insurance: No  New side effects reported not previously addressed with a pharmacist or physician: None reported  Questions for the pharmacist: No    Confirmed patient received a Conservation officer, historic buildings and a Surveyor, mining with first shipment. The patient will receive a drug information handout for each medication shipped and additional FDA Medication Guides as required.       DISEASE/MEDICATION-SPECIFIC INFORMATION        N/A    SPECIALTY MEDICATION ADHERENCE     Medication Adherence    Patient reported X missed doses in the last month: 0  Specialty Medication: cycloSPORINE modified 25 MG capsule (NEORAL)  Patient is on additional specialty medications: No              Were doses missed due to medication being on hold? No    cyclosporine 25 mg: 7 days of medicine on hand       REFERRAL TO PHARMACIST     Referral to the pharmacist: Not needed      [facility]     Shipping address confirmed in Epic.       Delivery Scheduled: Yes, Expected medication delivery date: 04/03/23.     Medication will be delivered via UPS to the prescription address in Epic WAM.    Gina Hart   [facility]  Specialty Technician

## 2023-03-29 NOTE — Unmapped (Signed)
Returned patient's call to review her latest lab results.  Discussed the following:    She will increase dietary intake of Magnesium for occasional muscle cramping.  She is aware she can start Mag Oxide 400 mg daily OTC if needed    She has complaints about occasional intermittent fistula discomfort.  Schedulers messaged to make appt with Dr Margaretmary Bayley for in person follow up and will discuss possible plan for fistula revision then.    Discussed elevated Calcium and Cholesterol levels.  Patient plans to limit cheese and egg intake as she says she has increased these recently.

## 2023-03-30 LAB — CYCLOSPORINE LEVEL: CYCLOSPORINE (FPIA) BLOOD: 160 ng/mL (ref 100–400)

## 2023-04-02 MED FILL — CYCLOSPORINE MODIFIED 25 MG CAPSULE: ORAL | 30 days supply | Qty: 300 | Fill #8

## 2023-04-03 ENCOUNTER — Inpatient Hospital Stay: Admission: RE | Admit: 2023-04-03 | Payer: Medicare HMO | Source: Ambulatory Visit

## 2023-04-03 DIAGNOSIS — Z940 Kidney transplant status: Principal | ICD-10-CM

## 2023-04-03 MED ORDER — ATORVASTATIN 10 MG TABLET
ORAL_TABLET | ORAL | 3 refills | 90 days | Status: CP
Start: 2023-04-03 — End: ?

## 2023-04-04 LAB — COLOGUARD

## 2023-04-19 ENCOUNTER — Inpatient Hospital Stay: Admission: RE | Admit: 2023-04-19 | Payer: Medicare HMO | Source: Ambulatory Visit

## 2023-04-23 NOTE — Unmapped (Signed)
[facility] Special[facility] and Home Delivery Pharmacy Refill Coordination Note    Specialty Medication(s) to be Shipped:   Transplant: cyclosporine 25 mg    Other medication(s) to be shipped: No additional medications requested for fill at this time     Gina Hart, DOB: [birth year]  Phone: [phone] (home)       All above HIPAA information was verified with patient.     Was a Nurse, learning disability used for this call? No    Completed refill call assessment today to schedule patient's medication shipment from the [facility]  [phone]).  All relevant notes have been reviewed.     Specialty medication(s) and dose(s) confirmed: Regimen is correct and unchanged.   Changes to medications: Gina Hart reports no changes at this time.  Changes to insurance: No  New side effects reported not previously addressed with a pharmacist or physician: None reported  Questions for the pharmacist: No    Confirmed patient received a Conservation officer, historic buildings and a Surveyor, mining with first shipment. The patient will receive a drug information handout for each medication shipped and additional FDA Medication Guides as required.       DISEASE/MEDICATION-SPECIFIC INFORMATION        N/A    SPECIALTY MEDICATION ADHERENCE     Medication Adherence    Patient reported X missed doses in the last month: 0  Specialty Medication: cycloSPORINE modified 25 MG capsule (NEORAL)  Patient is on additional specialty medications: No              Were doses missed due to medication being on hold? No    cyclosporine 25 mg: 5 days of medicine on hand       REFERRAL TO PHARMACIST     Referral to the pharmacist: Not needed      [facility]     Shipping address confirmed in Epic.       Delivery Scheduled: Yes, Expected medication delivery date: 04/27/23.     Medication will be delivered via UPS to the prescription address in Epic WAM.    Gina Hart   [facility] and [facility] Delivery Pharm[facility]  Specialty Technician

## 2023-04-25 ENCOUNTER — Ambulatory Visit
Admit: 2023-04-25 | Discharge: 2023-04-26 | Payer: MEDICARE | Attending: Obstetrics & Gynecology | Primary: Obstetrics & Gynecology

## 2023-04-25 NOTE — Unmapped (Signed)
Referring Provider:Miles, Neena Rhymes, MD  [address]  [address] [place],  [place] [address]    ASSESSMENT:  47 y.o. female at risk for cervical dysplasia.    PLAN:  1) Cervical Dysplasia: follow up pending results of colposcopy        RTC in  12 months, if not sooner, depending on the results of testing today.    Dr. Mariann Barter was in agreement and immediately available for the care of the patient.  Dr. Mariann Barter was present for the colposcopy and in agreement.      CC: Cervical Dysplasia    HPI: 47 y.o.  G3  P3  who presents today for evaluation and management of abnormal cervical cytology.      Post-coital bleeding: no  Abnormal uterine bleeding: no  Patient's last menstrual period was 04/30/2016.    Vaginal discharge: no  Currently pregnant?:no    Dysplasia Hx:  - 03/2023 NILM, HPV +     MEDICATIONS:  Antiretroviral medications:no  Immunosuppressant medications:yes  Current Outpatient Medications on File Prior to Visit   Medication Sig Dispense Refill    acetaminophen (TYLENOL) 325 MG tablet Take 1-2 tablets (325-650 mg total) by mouth every four (4) hours as needed for pain. 100 tablet 0    amlodipine (NORVASC) 2.5 MG tablet Take 3 tablets (7.5 mg total) by mouth daily. 90 tablet 11    aspirin (ECOTRIN) 81 MG tablet TAKE 1 TABLET (81 MG) BY MOUTH DAILY 30 tablet 0    atorvastatin (LIPITOR) 10 MG tablet TAKE ONE TABLET BY MOUTH ONCE DAILY 90 tablet 3    biotin 5 mg cap Take 1 capsule (5,000 mcg total) by mouth daily. 30 capsule 11    chlorthalidone (HYGROTON) 25 MG tablet Take 1 tablet (25 mg total) by mouth every morning. 30 tablet 11    cyclobenzaprine (FLEXERIL) 10 MG tablet Take 1 tablet (10 mg total) by mouth two (2) times a day as needed for muscle spasms. (Patient not taking: Reported on 04/25/2023) 45 tablet 0    cycloSPORINE modified (NEORAL) 25 MG capsule Take 5 capsules (125 mg total) by mouth two (2) times a day. 300 capsule 11    fluticasone propionate (FLONASE) 50 mcg/actuation nasal spray 1 spray by Each Nare route daily. 16 g 4    hydrOXYzine (ATARAX) 25 MG tablet Take 1 tablet (25 mg total) by mouth nightly as needed. (Patient not taking: Reported on 04/25/2023) 30 tablet 3    loratadine (CLARITIN) 10 mg tablet Take 10 mg by mouth daily as needed for allergies.      magnesium oxide (MAG-OX) 400 mg (241.3 mg elemental magnesium) tablet Take 1 tablet (400 mg total) by mouth daily. 30 tablet 11    melatonin 5 mg tablet Take 1 tablet (5 mg total) by mouth nightly as needed. 30 tablet 11    metoPROLOL tartrate (LOPRESSOR) 100 MG tablet TAKE ONE TABLET TWICE DAILY 180 tablet 3    potassium chloride 20 MEQ ER tablet Take 1 tablet (20 mEq total) by mouth daily. 30 tablet 11    predniSONE (DELTASONE) 5 MG tablet Take 1 tablet (5 mg total) by mouth daily. 30 tablet 11    rOPINIRole (REQUIP) 0.25 MG tablet TAKE ONE TABLET BY MOUTH 1-3 HOURS BEFORE BEDTIME. (Patient not taking: Reported on 04/25/2023) 30 tablet 0    traZODone (DESYREL) 50 MG tablet Take 2 tablets (100 mg total) by mouth nightly. 60 tablet 11    WEGOVY  0.25 MG/0.5 ML SUBCUTANEOUS PEN INJECTOR Inject 0.25 mg under the skin every seven (7) days. (Patient not taking: Reported on 04/25/2023) 2 mL 11     No current facility-administered medications on file prior to visit.         Allergies   Allergen Reactions    Ibuprofen Swelling     Other reaction(s): SWELLING/EDEMA    Sulfa (Sulfonamide Antibiotics)        Past Medical History:   Diagnosis Date    ESRD (end stage renal disease) (CMS-HCC)     Focal segmental glomerulosclerosis 1998    Hypertension     Pulmonary hypertension (CMS-HCC)     Scoliosis (and kyphoscoliosis), idiopathic      -Hx of HIV:no  -Hx of HIV testing:yes  --If yes, last test:2018 (year)     Past Surgical History:   Procedure Laterality Date    BACK SURGERY      CHG US GUIDE, VASCULAR ACCESS Right 07/11/2013    Procedure: ULTRASOUND GUIDANCE FOR VASC ACCESS REQUIRING [place] EVAL OF POTENTIAL ACCESS SITES; Surgeon: Alben Deeds, MD;  Location: MAIN OR [facility];  Service: Vascular    PR AV ANAST,FOREARM VEIN TRANSPOSITION Right 07/11/2013    Procedure: R. UE BRACHIOCEPHALIC FISTULA;  Surgeon: Alben Deeds, MD;  Location: MAIN OR [facility];  Service: Vascular    PR AV ANAST,UP ARM BASILIC VEIN TRANSPOSIT Right 07/11/2013    Procedure: BASILIC VEIN TRANSPOSITION;  Surgeon: Alben Deeds, MD;  Location: MAIN OR [facility];  Service: Vascular    PR RIGHT HEART CATH O2 SATURATION & CARDIAC OUTPUT N/A 06/16/2015    Procedure: Right Heart Catheterization;  Surgeon: Alvira Philips, MD;  Location: [facility] EP;  Service: Cardiology    PR TRANSPLANTATION OF KIDNEY Right 01/11/2017    Procedure: RENAL ALLOTRANSPLANTATION, IMPLANTATION OF GRAFT; WITHOUT RECIPIENT NEPHRECTOMY;  Surgeon: Leona Carry, MD;  Location: MAIN OR [facility];  Service: Transplant       OB History       Gravida   3    Para   3    Term   3    Preterm        AB        Living             SAB        IAB        Ectopic        Molar        Multiple        Live Births                    GYNECOLOGIC HISTORY:   Contraception:no postmenopausal    Current condom use:no    Hx of STI: no  Number of HPV vaccinations:0    Social History     Socioeconomic History    Marital status: Divorced    Number of children: 3    Years of education: 12   Occupational History    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 No    Tanning bed use? No    Are you easily burned? No    Excessive sun exposure? No  Blistering sunburns? No       family history includes Hypertension in her mother; No Known Problems in her daughter, father, maternal grandfather, maternal grandmother, paternal grandfather, paternal grandmother, and sister.      ROS: Otherwise negative across the balance of 10 systems except as noted per HPI.    PHYSICAL EXAM:  BP 129/74  - Pulse 73  - LMP 04/30/2016     -General: NAD.  The patient is awake and alert.  -Psych: Appropriate patient interaction and affect.  The patient is appropriately addressed and interactive  .-Abd: Soft, non-tender, non-distended.  Normoactive bowel sounds.  No guarding or rebound.  No hernia.  -Genitourinary:      -EGBUS are normal.      -SSE:       -Vagina is pink, well rugated vaginal mucosa, and without gross lesions: Yes.        -Cervix:           -Pap smear performed today: yes          -Colposcopy performed with 4% acetic acid:yes               -Entire transformation zone visualized: yes               -Lesion(s) Identified: yes               -Location(s): 0500 o'clock.               -Biopsy performed at 0500 o'clock with hemostasis identified afterwards: Yes.               -ECC performed: yes     -Extremities: No clubbing or edema.  No rash.    Chaperone was present during pelvic examination.

## 2023-04-26 MED FILL — CYCLOSPORINE MODIFIED 25 MG CAPSULE: ORAL | 30 days supply | Qty: 300 | Fill #9

## 2023-05-03 ENCOUNTER — Inpatient Hospital Stay: Admission: RE | Admit: 2023-05-03 | Payer: Medicare HMO | Source: Ambulatory Visit

## 2023-05-07 DIAGNOSIS — N186 End stage renal disease: Principal | ICD-10-CM

## 2023-05-07 DIAGNOSIS — R7989 Other specified abnormal findings of blood chemistry: Principal | ICD-10-CM

## 2023-05-07 DIAGNOSIS — Z940 Kidney transplant status: Principal | ICD-10-CM

## 2023-05-07 DIAGNOSIS — R82998 Other abnormal findings in urine: Principal | ICD-10-CM

## 2023-05-14 DIAGNOSIS — Z940 Kidney transplant status: Principal | ICD-10-CM

## 2023-05-14 LAB — CBC W/ DIFFERENTIAL
BASOPHILS ABSOLUTE COUNT: 0 10*3/uL — ABNORMAL LOW
BASOPHILS RELATIVE PERCENT: 0.4 % (ref 0.0–2.0)
EOSINOPHILS ABSOLUTE COUNT: 0.1 (ref 0.04–0.5)
EOSINOPHILS RELATIVE PERCENT: 2.5 % (ref 1.0–8.0)
HEMATOCRIT: 40.2
HEMOGLOBIN: 13.8 (ref 11.9–16.0)
LYMPHOCYTES ABSOLUTE COUNT: 1.1 (ref 1.0–2.7)
LYMPHOCYTES RELATIVE PERCENT: 19.1 % — ABNORMAL LOW (ref 20–45)
MEAN CORPUSCULAR HEMOGLOBIN CONC: 34.3 (ref 33.3–35.5)
MEAN CORPUSCULAR HEMOGLOBIN: 29.8 pg (ref 25.8–33.0)
MEAN CORPUSCULAR VOLUME: 87 fl (ref 78–95)
MEAN PLATELET VOLUME: 8.4 fl (ref 7.5–11.2)
MONOCYTES ABSOLUTE COUNT: 0.7 uL (ref 0.2–0.8)
MONOCYTES RELATIVE PERCENT: 12.7 % — ABNORMAL HIGH (ref 4.0–12.0)
NEUTROPHILS ABSOLUTE COUNT: 3.6
NEUTROPHILS RELATIVE PERCENT: 65.3 % (ref 42–74)
PLATELET COUNT: 246 uL (ref 150–480)
RED BLOOD CELL COUNT: 4.6 10*6/uL
RED CELL DISTRIBUTION WIDTH: 13.6 % (ref 12.1–15.0)
WHITE BLOOD CELL COUNT: 5.5

## 2023-05-14 LAB — BASIC METABOLIC PANEL
ANION GAP: 11.8 (ref 2.0–15.0)
BLOOD UREA NITROGEN: 31 — ABNORMAL HIGH
BUN / CREAT RATIO: 25.8 — ABNORMAL HIGH (ref 10.00–16.00)
CALCIUM: 9.8 (ref 8.5–10.1)
CHLORIDE: 101 (ref 100–108)
CO2: 26.2 mmol/L (ref 21–32)
CREATININE: 1.2
EGFR CKD-EPI AA FEMALE: 56
GLUCOSE RANDOM: 99
MAGNESIUM: 1.5 — ABNORMAL LOW (ref 1.8–2.4)
OSMOLALITY CALCULATION: 294
PHOSPHORUS: 4 (ref 2.6–4.7)
POTASSIUM: 3 mmol/L — ABNORMAL LOW (ref 3.5–5.1)
SODIUM: 139 mmol/L (ref 136–145)

## 2023-05-14 LAB — URINALYSIS WITH MICROSCOPY WITH CULTURE REFLEX
BILIRUBIN UA: NEGATIVE
BLOOD UA: NEGATIVE
GLUCOSE UA: NEGATIVE
KETONES UA: NEGATIVE
LEUKOCYTE ESTERASE UA: NEGATIVE
NITRITE UA: NEGATIVE
PH UA: 6 (ref 4.5–8)
PROTEIN UA: NEGATIVE
SPECIFIC GRAVITY UA: 1.02 (ref 1.005–1.030)
UROBILINOGEN UA: 0.2

## 2023-05-14 MED ORDER — POTASSIUM CHLORIDE ER 20 MEQ TABLET,EXTENDED RELEASE(PART/CRYST)
ORAL_TABLET | Freq: Two times a day (BID) | ORAL | 11 refills | 15 days | Status: CP
Start: 2023-05-14 — End: 2024-05-13

## 2023-05-14 NOTE — Unmapped (Signed)
Received page for critical lab - K 3.0. Notified primary TNC and Dr. Margaretmary Bayley.

## 2023-05-14 NOTE — Unmapped (Signed)
Called patient regarding Potassium of 3.0.  Per Dr Margaretmary Bayley, plan to increase Potassium Chloride to BID.  Patient made aware.  Prescription sent to local pharmacy.

## 2023-05-16 DIAGNOSIS — Z940 Kidney transplant status: Principal | ICD-10-CM

## 2023-05-16 LAB — CYCLOSPORINE LEVEL: CYCLOSPORINE (FPIA) BLOOD: 224 ng/mL (ref 100–400)

## 2023-05-17 NOTE — Unmapped (Signed)
One time lab order faxed

## 2023-05-18 DIAGNOSIS — N390 Urinary tract infection, site not specified: Principal | ICD-10-CM

## 2023-05-18 DIAGNOSIS — Z940 Kidney transplant status: Principal | ICD-10-CM

## 2023-05-18 MED ORDER — AMOXICILLIN 875 MG-POTASSIUM CLAVULANATE 125 MG TABLET
ORAL_TABLET | Freq: Two times a day (BID) | ORAL | 0 refills | 7 days | Status: CP
Start: 2023-05-18 — End: 2023-05-25

## 2023-05-18 MED ORDER — ATORVASTATIN 20 MG TABLET
ORAL_TABLET | Freq: Every day | ORAL | 3 refills | 90 days | Status: CP
Start: 2023-05-18 — End: 2024-05-17

## 2023-05-19 NOTE — Unmapped (Signed)
error 

## 2023-05-19 NOTE — Unmapped (Signed)
Per Dr. Margaretmary Bayley - Start Augmentin 875 mg BID x days. Per pt request increase atorvastatin to 20 mg daily. Ok per Dr. Margaretmary Bayley, will monitor. Will notify primary TNC to check in on Monday.

## 2023-05-21 DIAGNOSIS — Z940 Kidney transplant status: Principal | ICD-10-CM

## 2023-05-21 MED ORDER — FLUCONAZOLE 150 MG TABLET
ORAL_TABLET | Freq: Once | ORAL | 0 refills | 1 days | Status: CP
Start: 2023-05-21 — End: 2023-05-21

## 2023-05-21 NOTE — Unmapped (Signed)
[facility] Specialty and Home Delivery Pharmacy Refill Coordination Note    Specialty Medication(s) to be Shipped:   Transplant: cyclosporine 25mg     Other medication(s) to be shipped: No additional medications requested for fill at this time     Gina Hart, DOB: 05/07/1976  Phone: [phone] (home)       All above HIPAA information was verified with patient.     Was a Nurse, learning disability used for this call? No    Completed refill call assessment today to schedule patient's medication shipment from the [facility] Delivery Pharm[facility]  [phone]).  All relevant notes have been reviewed.     Specialty medication(s) and dose(s) confirmed: Regimen is correct and unchanged.   Changes to medications: Gina Hart reports no changes at this time.  Changes to insurance: No  New side effects reported not previously addressed with a pharmacist or physician: None reported  Questions for the pharmacist: No    Confirmed patient received a Conservation officer, historic buildings and a Surveyor, mining with first shipment. The patient will receive a drug information handout for each medication shipped and additional FDA Medication Guides as required.       DISEASE/MEDICATION-SPECIFIC INFORMATION        N/A    SPECIALTY MEDICATION ADHERENCE     Medication Adherence    Patient reported X missed doses in the last month: 0  Specialty Medication: cycloSPORINE modified 25 MG capsule (NEORAL)  Patient is on additional specialty medications: No  Patient is on more than two specialty medications: No  Any gaps in refill history greater than 2 weeks in the last 3 months: no  Demonstrates understanding of importance of adherence: yes  Informant: patient  Confirmed plan for next specialty medication refill: delivery by pharmacy  Refills needed for supportive medications: not needed          Refill Coordination    Has the Patients' Contact Information Changed: No  Is the Shipping Address Different: No         Were doses missed due to medication being on hold? No    cycloSPORINE modified 25   mg: 6 days of medicine on hand       REFERRAL TO PHARMACIST     Referral to the pharmacist: Not needed      [facility]     Shipping address confirmed in Epic.       Delivery Scheduled: Yes, Expected medication delivery date: 05/23/23.     Medication will be delivered via UPS to the prescription address in Epic WAM.    Kerby Less   [facility] Specialty and Home Delivery Pharmacy  Specialty Technician

## 2023-05-22 MED FILL — CYCLOSPORINE MODIFIED 25 MG CAPSULE: ORAL | 30 days supply | Qty: 300 | Fill #10

## 2023-05-23 DIAGNOSIS — Z940 Kidney transplant status: Principal | ICD-10-CM

## 2023-05-23 MED ORDER — ROSUVASTATIN 5 MG TABLET
ORAL_TABLET | Freq: Every day | ORAL | 3 refills | 90 days | Status: CP
Start: 2023-05-23 — End: 2024-05-22

## 2023-05-23 NOTE — Unmapped (Signed)
Returned patient's call regarding Atorvastatin.  She states insurance will not cover cost of medication due to risk of side effects.  Per Dr Christena Deem, patient to transition to Rosuvastatin 5 mg daily.  Prescription sent to [facility].  Patient agreeable with this plan.  Plan to repeat Lipid Panel in one month.

## 2023-05-28 NOTE — Unmapped (Unsigned)
Otolaryngology-Head and Neck Surgery Consult Note    Date of Service:  July 09, 2019  Requesting Attending Physician: Biagio Quint,*  Reason for Consult:  Symmetric intense hypermetabolic activity in thickened lingual tonsils.      Assessment:  No diagnosis found.      47 y.o. female with outside PET imaging that showed hypermetabolic activity in thickened lingual tonsils. Imaging was not available for my review. I have requested for imaging to be sent here at [facility] for a formal radiological read. FFL showed enlarged, but benign appearing, lingual tonsils bilaterally.No obvious masses.     Plan:  - Without outside PET imaging from 06/25/19 for my official review, we discussed a differential diagnosis of enlarged lingual tissue as posible inflammation. Will request the PET imaging here at [facility]  - Patient had congested nasal mucosa and enlarged IT on exam today. She does endorse mouth breathing and symptoms of nasal congestion.  Recommended that she use nasal sprays (flonase) QD for 1 month. Discussed daily and proper use of Flonase with her.  If symptoms persist, we discussed the possible need for a turbinate reduction in the future.   - Patient reports a prior history of sleep apnea. She does snore and does have episodes gasping for air. There is a question on physical exam whether or not the patient has had a UPPP in the past but the patient does not remember if shes had one or not. The patient is not currently using a CPAP machine and does not follow with anyone for her reported OSA. I recommended that she get a repeat sleep study and follow up with sleep medicine for her possible OSA.   - Follow up with me PRN  - The patient will call with any questions or concerns.     No chief complaint on file.      History of Present Illness:     Gina Hart is a 47 y.o. African-American pleasant female being seen in consultation at the request of Dr. Gordan Payment for evaluation and opinion of PET CT results from 06/25/19 which showed symmetric intense hypermetabolic activity in thickened lingual tonsils. Patient has a PMH of a right kidney transplant on 01/11/17 which is currently being managed by her nephrologist Dr. Margaretmary Bayley. Her last visit to nephrology on 06/05/19 she c/o nasal and sinus congestion persistent for 1 month prior. She further stated that she had no sense of taste or smell. COVID-19 test results were negative as of 06/10/19.  Dr. Margaretmary Bayley discussed abnormal lab findings regarding EBV viral load with the patient and planned for a PET CT which was performed on 06/25/19 at the medical mall at [facility]. Results showed symmetric intense hypermetabolic activity in thickened lingual tonsils.     Today, the patient presents to clinic and endorses a sore throat with loss of hearing in her left ear that began a few days before her recent PET scan. This has since resolved. When she wakes in the morning, she notices a change in color of phlegm that she clears from her nose/throat. She mentioned that she as had a sleep study in the past which reported that she has sleep apnea but does not use a CPAP machine. Furthermore, she mentioned that she snores which wakes herself up sometimes. She had a negative COVID test but endorses loss of smell/taste. Denies fever, dysphagia. Denies smoking/alcohol usage.     Modifying factors:  none  Other associated symptoms: Change in color of phlegm that she clears  from her nose/throat in the morning.  Severity is rated as mild    The patient denies fevers, weight loss, SOB, pain, dysphagia, odynophagia, hoarseness, hemoptysis, otalgia, or neck masses/growths.      Past Medical History    Tonsillectomy as a teenager    Past Medical History:   Diagnosis Date    ESRD (end stage renal disease) (CMS-HCC)     Focal segmental glomerulosclerosis 1998    Hypertension     Pulmonary hypertension (CMS-HCC)     Scoliosis (and kyphoscoliosis), idiopathic          Past Surgical History    Past Surgical History:   Procedure Laterality Date    BACK SURGERY      CHG US GUIDE, VASCULAR ACCESS Right 07/11/2013    Procedure: ULTRASOUND GUIDANCE FOR VASC ACCESS REQUIRING [place] EVAL OF POTENTIAL ACCESS SITES;  Surgeon: Alben Deeds, MD;  Location: MAIN OR [facility];  Service: Vascular    PR AV ANAST,FOREARM VEIN TRANSPOSITION Right 07/11/2013    Procedure: R. UE BRACHIOCEPHALIC FISTULA;  Surgeon: Alben Deeds, MD;  Location: MAIN OR [facility];  Service: Vascular    PR AV ANAST,UP ARM BASILIC VEIN TRANSPOSIT Right 07/11/2013    Procedure: BASILIC VEIN TRANSPOSITION;  Surgeon: Alben Deeds, MD;  Location: MAIN OR [facility];  Service: Vascular    PR RIGHT HEART CATH O2 SATURATION & CARDIAC OUTPUT N/A 06/16/2015    Procedure: Right Heart Catheterization;  Surgeon: Alvira Philips, MD;  Location: [facility];  Service: Cardiology    PR TRANSPLANTATION OF KIDNEY Right 01/11/2017    Procedure: RENAL ALLOTRANSPLANTATION, IMPLANTATION OF GRAFT; WITHOUT RECIPIENT NEPHRECTOMY;  Surgeon: Leona Carry, MD;  Location: MAIN OR [facility];  Service: Transplant         Medications      Current Outpatient Medications   Medication Sig Dispense Refill    acetaminophen (TYLENOL) 325 MG tablet Take 1-2 tablets (325-650 mg total) by mouth every four (4) hours as needed for pain. 100 tablet 0    amlodipine (NORVASC) 2.5 MG tablet Take 3 tablets (7.5 mg total) by mouth daily. 90 tablet 11    aspirin (ECOTRIN) 81 MG tablet TAKE 1 TABLET (81 MG) BY MOUTH DAILY 30 tablet 0    biotin 5 mg cap Take 1 capsule (5,000 mcg total) by mouth daily. 30 capsule 11    chlorthalidone (HYGROTON) 25 MG tablet Take 1 tablet (25 mg total) by mouth every morning. 30 tablet 11    cyclobenzaprine (FLEXERIL) 10 MG tablet Take 1 tablet (10 mg total) by mouth two (2) times a day as needed for muscle spasms. (Patient not taking: Reported on 04/25/2023) 45 tablet 0    cycloSPORINE modified (NEORAL) 25 MG capsule Take 5 capsules (125 mg total) by mouth two (2) times a day. 300 capsule 11    fluticasone propionate (FLONASE) 50 mcg/actuation nasal spray 1 spray by Each Nare route daily. 16 g 4    hydrOXYzine (ATARAX) 25 MG tablet Take 1 tablet (25 mg total) by mouth nightly as needed. (Patient not taking: Reported on 04/25/2023) 30 tablet 3    loratadine (CLARITIN) 10 mg tablet Take 10 mg by mouth daily as needed for allergies.      magnesium oxide (MAG-OX) 400 mg (241.3 mg elemental magnesium) tablet Take 1 tablet (400 mg total) by mouth daily. 30 tablet 11    melatonin 5 mg tablet Take 1 tablet (5 mg total) by mouth nightly as needed. 30 tablet 11  metoPROLOL tartrate (LOPRESSOR) 100 MG tablet TAKE ONE TABLET TWICE DAILY 180 tablet 3    potassium chloride 20 MEQ ER tablet Take 1 tablet (20 mEq total) by mouth two (2) times a day. 30 tablet 11    predniSONE (DELTASONE) 5 MG tablet Take 1 tablet (5 mg total) by mouth daily. 30 tablet 11    rOPINIRole (REQUIP) 0.25 MG tablet TAKE ONE TABLET BY MOUTH 1-3 HOURS BEFORE BEDTIME. (Patient not taking: Reported on 04/25/2023) 30 tablet 0    rosuvastatin (CRESTOR) 5 MG tablet Take 1 tablet (5 mg total) by mouth daily. 90 tablet 3    traZODone (DESYREL) 50 MG tablet Take 2 tablets (100 mg total) by mouth nightly. 60 tablet 11    WEGOVY 0.25 MG/0.5 ML SUBCUTANEOUS PEN INJECTOR Inject 0.25 mg under the skin every seven (7) days. (Patient not taking: Reported on 04/25/2023) 2 mL 11     No current facility-administered medications for this visit.         Allergies    Ibuprofen and Sulfa (sulfonamide antibiotics)      Social History:    Tobacco use:  reports that she has never smoked. She has never used smokeless tobacco.  Alcohol use:  reports no history of alcohol use.  Drug use:  reports no history of drug use.      Family History    The patient's family history includes Hypertension in her mother; No Known Problems in her daughter, father, maternal grandfather, maternal grandmother, paternal grandfather, paternal grandmother, and sister..      Review of Systems  A 10 system review of systems was negative except as noted in HPI and intake encounter form, which was reviewed and scanned into the media section of the medical record      Objective     Vital Signs  LMP 04/30/2016       Physical Exam    Vitals signs reviewed in the nursing chart  General:  WD, WN, well groomed, pleasant female, sitting up in NAD, normal appearance and voice, communicates appropriately, no stridor.  Psychiatric/Neuro:  A&Ox3, Normal mood and affect, Following commands, MAE. Cranial nerves 2-12 intact, No focal deficits.   Head and Face:  AT, [place], no asymmetry, Skin with no masses or lesions, sinuses nontender to palpation.  Parotid and submandibular glands normal bilaterally.  Facial strength normal bilaterally.   Eyes:  EOM Intact, sclera anicteric, no conjunctival injection, PERRL.   Nose:  Normal external nasal pyramid, anterior rhinoscopy shows normal  mucosa, septum, and turbinates.   Ears:  Normal auricles, EAC's, and TM's to otoscopy.  TM's intact and mobile, Middle ears aerated bilaterally.  Hearing:  Normal hearing to whispered voice.  Oral Cavity:   Normal lips, gingiva, tongue, hard palate, and dentition; pink, moist mucosa, no lesions, tongue is mobile.  Floor of mouth is soft.  Oropharynx:  Congestion of inferior turbinate L>R. Surgical absence of uvula and tonsils. No masses palpated. No lesions. BOT soft, soft palate and posterior pharynx, symmetric pink, moist mucosa  Neck:  No masses, no thyroid nodules, no thyromegaly, trachea midline. Normal laryngeal crepitus.  Lymphatics:  No cervical lymphadenopathy.  Respiratory:  Normal respiratory effort, symmetric chest rise, no accessory muscle usage.  Cardiovascular:  No clubbing, cyanosis, swelling, edema or varicosities in extremities, Regular Rate.      Laryngoscopy  Procedure Note    Pre-operative Diagnosis:   No diagnosis found.      Post-operative Diagnosis: same    Anesthesia: none  Surgeon: Epimenio Sarin. Osa Campoli    Endoscopy Type:  Flexible Fiberoptic Laryngoscopy    Laryngoscope E (serial number [id number]) was used during this visit on July 09, 2019.     Indications: To better evaluate the patient???s tumor site    Procedure Details:    The patient was placed in the sitting position.  After topical anesthesia and decongestion, the 4 mm laryngoscope was passed.  The nasal cavities, nasopharynx, oropharynx, hypopharynx, and larynx were all examined.  Vocal cords were examined during respiration and phonation.  The following findings were noted:    Findings:  Nasal cavities:  Enlarged inferior turbinates L>R. Normal mucosa, patent, no masses, lesions, and septum. Congested nasal mucosa.  Nasopharynx:  Normal pink moist mucosa, normal eustachian tubes, no masses.  Oropharynx:  Symmericly enlarged lingual tonsils. Tonsils filling the vallecula and slightly causing a retroflexed epiglottis. No obvious masses. Normal palate, pharyngeal wall, and base of tongue, mucosa pink and moist  Hypopharynx:  Normal pharyngeal walls and pyriform sinuses, no pooling of secretions  Larynx:  Supraglottis, false and true vocal cord are normal.  Vocal cord mobility is normal.  Subglottis is patent.    Condition:  Stable.  Patient tolerated procedure well.    Complications:  None                        Test Results    none      Imaging    I have personally reviewed all pertinent imaging results.    06/25/19 PET CT Skull Base to Thigh:  IMPRESSION:  1. No hypermetabolic or enlarged lymph nodes skull base to thigh by FDG PET scan  2. Symmetric intense hypermetabolic in thickened lingual tonsils. Recommend clinical correlation  3. Normal spleen and marrow  4. Renal transplant in the right iliac fossa

## 2023-05-31 DIAGNOSIS — Z940 Kidney transplant status: Principal | ICD-10-CM

## 2023-05-31 NOTE — Unmapped (Signed)
Returned patient's call.  She has discussed holding Chlorthalidone with Dr Margaretmary Bayley due to low K and Mag.  She has been holding, but not yet checked bp's.  She will check for the next few days and call this coordinator back with values.  Per Dr Margaretmary Bayley, patient may be able to stop Amlodipine as well if blood pressures allow.

## 2023-06-04 DIAGNOSIS — Z940 Kidney transplant status: Principal | ICD-10-CM

## 2023-06-04 LAB — CBC W/ DIFFERENTIAL
BASOPHILS ABSOLUTE COUNT: 0 10*3/uL — ABNORMAL LOW (ref 0.01–0.09)
BASOPHILS RELATIVE PERCENT: 0.6 % (ref 0.0–2.0)
EOSINOPHILS ABSOLUTE COUNT: 0.1 (ref 0.04–0.5)
EOSINOPHILS RELATIVE PERCENT: 2.9 % (ref 1.0–8.0)
HEMATOCRIT: 40.5
HEMOGLOBIN: 14 (ref 11.9–16.0)
LYMPHOCYTES ABSOLUTE COUNT: 1.2 (ref 1.0–2.7)
LYMPHOCYTES RELATIVE PERCENT: 25 % (ref 20–45)
MEAN CORPUSCULAR HEMOGLOBIN CONC: 34.6 (ref 33.3–35.5)
MEAN CORPUSCULAR HEMOGLOBIN: 29.6 pg (ref 25.8–33.0)
MEAN CORPUSCULAR VOLUME: 85.7 fl (ref 78–95)
MEAN PLATELET VOLUME: 8.1 fl (ref 7.5–11.2)
MONOCYTES ABSOLUTE COUNT: 0.6 uL (ref 0.2–0.8)
MONOCYTES RELATIVE PERCENT: 13 % — ABNORMAL HIGH (ref 4.0–12.0)
NEUTROPHILS ABSOLUTE COUNT: 2.7
NEUTROPHILS RELATIVE PERCENT: 58.5 % (ref 42–74)
PLATELET COUNT: 262 uL (ref 150–480)
RED BLOOD CELL COUNT: 4.7 10*6/uL (ref 4.0–5.3)
RED CELL DISTRIBUTION WIDTH: 13.3 % (ref 12.1–15.0)
WHITE BLOOD CELL COUNT: 4.7

## 2023-06-04 LAB — URINALYSIS WITH MICROSCOPY WITH CULTURE REFLEX
BILIRUBIN UA: NEGATIVE
BLOOD UA: NEGATIVE
GLUCOSE UA: NEGATIVE
KETONES UA: NEGATIVE
LEUKOCYTE ESTERASE UA: NEGATIVE
NITRITE UA: NEGATIVE
PH UA: 6 (ref 4.5–8)
PROTEIN UA: NEGATIVE
SPECIFIC GRAVITY UA: 1.02 (ref 1.005–1.030)
UROBILINOGEN UA: 0.2

## 2023-06-04 LAB — BASIC METABOLIC PANEL
ANION GAP: 10.6 mmol/L (ref 2.0–15.0)
BLOOD UREA NITROGEN: 20 — ABNORMAL HIGH (ref 7–18)
BUN / CREAT RATIO: 20 — ABNORMAL HIGH (ref 10.00–16.00)
CALCIUM: 9.9 (ref 8.5–10.1)
CHLORIDE: 103 (ref 100–108)
CO2: 26.4 (ref 21–32)
CREATININE: 1 (ref 0.6–1.3)
EGFR CKD-EPI AA FEMALE: 69 mL/min
GLUCOSE RANDOM: 97 (ref 65–99)
MAGNESIUM: 1.7 — ABNORMAL LOW (ref 1.8–2.4)
OSMOLALITY CALCULATION: 292
PHOSPHORUS: 3.2 (ref 2.6–4.7)
POTASSIUM: 3.4 mmol/L — ABNORMAL LOW (ref 3.5–5.1)
SODIUM: 140 mmol/L (ref 136–145)

## 2023-06-04 NOTE — Unmapped (Signed)
Returned patient's call.  She notes that she has continued to take Chlorthalidone for now as well as Amlodipine. She was told to stop medication, but noted worsening edema with stopping.  She repeated labs today and results are pending.  Will discuss with team once K levels have resulted.

## 2023-06-06 LAB — CYCLOSPORINE LEVEL: CYCLOSPORINE (FPIA) BLOOD: 224 ng/mL (ref 100–400)

## 2023-06-14 NOTE — Unmapped (Unsigned)
 Otolaryngology-Head and Neck Surgery Consult Note    Date of Service:  July 09, 2019  Requesting Attending Physician: Biagio Quint,*  Reason for Consult:  Symmetric intense hypermetabolic activity in thickened lingual tonsils.      Assessment:  No diagnosis found.      47 y.o. female with outside PET imaging that showed hypermetabolic activity in thickened lingual tonsils. Imaging was not available for my review. I have requested for imaging to be sent here at [facility] for a formal radiological read. FFL showed enlarged, but benign appearing, lingual tonsils bilaterally.No obvious masses.     Plan:  - Without outside PET imaging from 06/25/19 for my official review, we discussed a differential diagnosis of enlarged lingual tissue as posible inflammation. Will request the PET imaging here at [facility]  - Patient had congested nasal mucosa and enlarged IT on exam today. She does endorse mouth breathing and symptoms of nasal congestion.  Recommended that she use nasal sprays (flonase) QD for 1 month. Discussed daily and proper use of Flonase with her.  If symptoms persist, we discussed the possible need for a turbinate reduction in the future.   - Patient reports a prior history of sleep apnea. She does snore and does have episodes gasping for air. There is a question on physical exam whether or not the patient has had a UPPP in the past but the patient does not remember if shes had one or not. The patient is not currently using a CPAP machine and does not follow with anyone for her reported OSA. I recommended that she get a repeat sleep study and follow up with sleep medicine for her possible OSA.   - Follow up with me PRN  - The patient will call with any questions or concerns.     No chief complaint on file.      History of Present Illness:     Gina Hart is a 47 y.o. African-American pleasant female being seen in consultation at the request of Dr. Gordan Payment for evaluation and opinion of PET CT results from 06/25/19 which showed symmetric intense hypermetabolic activity in thickened lingual tonsils. Patient has a PMH of a right kidney transplant on 01/11/17 which is currently being managed by her nephrologist Dr. Margaretmary Bayley. Her last visit to nephrology on 06/05/19 she c/o nasal and sinus congestion persistent for 1 month prior. She further stated that she had no sense of taste or smell. COVID-19 test results were negative as of 06/10/19.  Dr. Margaretmary Bayley discussed abnormal lab findings regarding EBV viral load with the patient and planned for a PET CT which was performed on 06/25/19 at the medical mall at [facility]. Results showed symmetric intense hypermetabolic activity in thickened lingual tonsils.     Today, the patient presents to clinic and endorses a sore throat with loss of hearing in her left ear that began a few days before her recent PET scan. This has since resolved. When she wakes in the morning, she notices a change in color of phlegm that she clears from her nose/throat. She mentioned that she as had a sleep study in the past which reported that she has sleep apnea but does not use a CPAP machine. Furthermore, she mentioned that she snores which wakes herself up sometimes. She had a negative COVID test but endorses loss of smell/taste. Denies fever, dysphagia. Denies smoking/alcohol usage.     Modifying factors:  none  Other associated symptoms: Change in color of phlegm that she clears  from her nose/throat in the morning.  Severity is rated as mild    The patient denies fevers, weight loss, SOB, pain, dysphagia, odynophagia, hoarseness, hemoptysis, otalgia, or neck masses/growths.      Past Medical History    Tonsillectomy as a teenager    Past Medical History:   Diagnosis Date    ESRD (end stage renal disease) (CMS-HCC)     Focal segmental glomerulosclerosis 1998    Hypertension     Pulmonary hypertension (CMS-HCC)     Scoliosis (and kyphoscoliosis), idiopathic          Past Surgical History    Past Surgical History:   Procedure Laterality Date    BACK SURGERY      CHG US GUIDE, VASCULAR ACCESS Right 07/11/2013    Procedure: ULTRASOUND GUIDANCE FOR VASC ACCESS REQUIRING [place] EVAL OF POTENTIAL ACCESS SITES;  Surgeon: Alben Deeds, MD;  Location: MAIN OR [facility];  Service: Vascular    PR AV ANAST,FOREARM VEIN TRANSPOSITION Right 07/11/2013    Procedure: R. UE BRACHIOCEPHALIC FISTULA;  Surgeon: Alben Deeds, MD;  Location: MAIN OR [facility];  Service: Vascular    PR AV ANAST,UP ARM BASILIC VEIN TRANSPOSIT Right 07/11/2013    Procedure: BASILIC VEIN TRANSPOSITION;  Surgeon: Alben Deeds, MD;  Location: [facility]IN [facility];  Service: Vascular    PR RIGHT HEART CATH O2 SATURATION & CARDIAC OUTPUT N/A 06/16/2015    Procedure: Right Heart Catheterization;  Surgeon: Alvira Philips, MD;  Location: [facility];  Service: Cardiology    PR TRANSPLANTATION OF KIDNEY Right 01/11/2017    Procedure: RENAL ALLOTRANSPLANTATION, IMPLANTATION OF GRAFT; WITHOUT RECIPIENT NEPHRECTOMY;  Surgeon: Leona Carry, MD;  Location: MAIN OR [facility];  Service: Transplant         Medications      Current Outpatient Medications   Medication Sig Dispense Refill    acetaminophen (TYLENOL) 325 MG tablet Take 1-2 tablets (325-650 mg total) by mouth every four (4) hours as needed for pain. 100 tablet 0    amlodipine (NORVASC) 2.5 MG tablet Take 3 tablets (7.5 mg total) by mouth daily. 90 tablet 11    aspirin (ECOTRIN) 81 MG tablet TAKE 1 TABLET (81 MG) BY MOUTH DAILY 30 tablet 0    biotin 5 mg cap Take 1 capsule (5,000 mcg total) by mouth daily. 30 capsule 11    chlorthalidone (HYGROTON) 25 MG tablet Take 1 tablet (25 mg total) by mouth every morning. 30 tablet 11    cyclobenzaprine (FLEXERIL) 10 MG tablet Take 1 tablet (10 mg total) by mouth two (2) times a day as needed for muscle spasms. (Patient not taking: Reported on 04/25/2023) 45 tablet 0    cycloSPORINE modified (NEORAL) 25 MG capsule Take 5 capsules (125 mg total) by mouth two (2) times a day. 300 capsule 11    fluticasone propionate (FLONASE) 50 mcg/actuation nasal spray 1 spray by Each Nare route daily. 16 g 4    hydrOXYzine (ATARAX) 25 MG tablet Take 1 tablet (25 mg total) by mouth nightly as needed. (Patient not taking: Reported on 04/25/2023) 30 tablet 3    loratadine (CLARITIN) 10 mg tablet Take 10 mg by mouth daily as needed for allergies.      magnesium oxide (MAG-OX) 400 mg (241.3 mg elemental magnesium) tablet Take 1 tablet (400 mg total) by mouth daily. 30 tablet 11    melatonin 5 mg tablet Take 1 tablet (5 mg total) by mouth nightly as needed. 30 tablet 11  metoPROLOL tartrate (LOPRESSOR) 100 MG tablet TAKE ONE TABLET TWICE DAILY 180 tablet 3    potassium chloride 20 MEQ ER tablet Take 1 tablet (20 mEq total) by mouth two (2) times a day. 30 tablet 11    predniSONE (DELTASONE) 5 MG tablet Take 1 tablet (5 mg total) by mouth daily. 30 tablet 11    rOPINIRole (REQUIP) 0.25 MG tablet TAKE ONE TABLET BY MOUTH 1-3 HOURS BEFORE BEDTIME. (Patient not taking: Reported on 04/25/2023) 30 tablet 0    rosuvastatin (CRESTOR) 5 MG tablet Take 1 tablet (5 mg total) by mouth daily. 90 tablet 3    traZODone (DESYREL) 50 MG tablet Take 2 tablets (100 mg total) by mouth nightly. 60 tablet 11    WEGOVY 0.25 MG/0.5 ML SUBCUTANEOUS PEN INJECTOR Inject 0.25 mg under the skin every seven (7) days. (Patient not taking: Reported on 04/25/2023) 2 mL 11     No current facility-administered medications for this visit.         Allergies    Ibuprofen and Sulfa (sulfonamide antibiotics)      Social History:    Tobacco use:  reports that she has never smoked. She has never used smokeless tobacco.  Alcohol use:  reports no history of alcohol use.  Drug use:  reports no history of drug use.      Family History    The patient's family history includes Hypertension in her mother; No Known Problems in her daughter, father, maternal grandfather, maternal grandmother, paternal grandfather, paternal grandmother, and sister..      Review of Systems  A 10 system review of systems was negative except as noted in HPI and intake encounter form, which was reviewed and scanned into the media section of the medical record      Objective     Vital Signs  LMP 04/30/2016       Physical Exam    Vitals signs reviewed in the nursing chart  General:  WD, WN, well groomed, pleasant female, sitting up in NAD, normal appearance and voice, communicates appropriately, no stridor.  Psychiatric/Neuro:  A&Ox3, Normal mood and affect, Following commands, MAE. Cranial nerves 2-12 intact, No focal deficits.   Head and Face:  AT, [place], no asymmetry, Skin with no masses or lesions, sinuses nontender to palpation.  Parotid and submandibular glands normal bilaterally.  Facial strength normal bilaterally.   Eyes:  EOM Intact, sclera anicteric, no conjunctival injection, PERRL.   Nose:  Normal external nasal pyramid, anterior rhinoscopy shows normal  mucosa, septum, and turbinates.   Ears:  Normal auricles, EAC's, and TM's to otoscopy.  TM's intact and mobile, Middle ears aerated bilaterally.  Hearing:  Normal hearing to whispered voice.  Oral Cavity:   Normal lips, gingiva, tongue, hard palate, and dentition; pink, moist mucosa, no lesions, tongue is mobile.  Floor of mouth is soft.  Oropharynx:  Congestion of inferior turbinate L>R. Surgical absence of uvula and tonsils. No masses palpated. No lesions. BOT soft, soft palate and posterior pharynx, symmetric pink, moist mucosa  Neck:  No masses, no thyroid nodules, no thyromegaly, trachea midline. Normal laryngeal crepitus.  Lymphatics:  No cervical lymphadenopathy.  Respiratory:  Normal respiratory effort, symmetric chest rise, no accessory muscle usage.  Cardiovascular:  No clubbing, cyanosis, swelling, edema or varicosities in extremities, Regular Rate.      Laryngoscopy  Procedure Note    Pre-operative Diagnosis:   No diagnosis found.      Post-operative Diagnosis: same    Anesthesia: none  Surgeon: Epimenio Sarin. Osa Campoli    Endoscopy Type:  Flexible Fiberoptic Laryngoscopy    Laryngoscope E (serial number [id number]) was used during this visit on July 09, 2019.     Indications: To better evaluate the patient???s tumor site    Procedure Details:    The patient was placed in the sitting position.  After topical anesthesia and decongestion, the 4 mm laryngoscope was passed.  The nasal cavities, nasopharynx, oropharynx, hypopharynx, and larynx were all examined.  Vocal cords were examined during respiration and phonation.  The following findings were noted:    Findings:  Nasal cavities:  Enlarged inferior turbinates L>R. Normal mucosa, patent, no masses, lesions, and septum. Congested nasal mucosa.  Nasopharynx:  Normal pink moist mucosa, normal eustachian tubes, no masses.  Oropharynx:  Symmericly enlarged lingual tonsils. Tonsils filling the vallecula and slightly causing a retroflexed epiglottis. No obvious masses. Normal palate, pharyngeal wall, and base of tongue, mucosa pink and moist  Hypopharynx:  Normal pharyngeal walls and pyriform sinuses, no pooling of secretions  Larynx:  Supraglottis, false and true vocal cord are normal.  Vocal cord mobility is normal.  Subglottis is patent.    Condition:  Stable.  Patient tolerated procedure well.    Complications:  None                        Test Results    none      Imaging    I have personally reviewed all pertinent imaging results.    06/25/19 PET CT Skull Base to Thigh:  IMPRESSION:  1. No hypermetabolic or enlarged lymph nodes skull base to thigh by FDG PET scan  2. Symmetric intense hypermetabolic in thickened lingual tonsils. Recommend clinical correlation  3. Normal spleen and marrow  4. Renal transplant in the right iliac fossa

## 2023-06-15 NOTE — Unmapped (Incomplete)
 Parke Simmers: [phone]    For urgent nursing questions please call the triage nurse line at [phone] during business hours 8am - 5pm.  After hours, please call [phone] and ask for the ENT physician on call.  If you are concerned, please do not hesitate to seek medical care at your local emergency department.    SCHEDULING  For surgery scheduling please call Marsela at [phone].  For scheduling clinic appointments please call [phone].  For scheduling speech therapy appointments please call [phone].    ABOUT YOUR CARE  For Oncology related questions, please call [phone].  Suzy Bouchard, Nurse Navigator for Drs. Milinda Antis, and [place].  Melvyn Novas, Nurse Navigator for Drs. Cecille Po, and Head and Neck Fellow    PAPERWORK/FORMS  Please include Name and Date of Birth on all faxes. FAX: [phone], Attn: Nurse Navigator.  Please allow 7-10 Business Days to complete any FMLA or Disability paperwork. Please include specific dates regarding time out of work.  Forms can not be returned by e-mail. Please include specific instructions regarding where completed forms should be returned.     IMAGING APPOINTMENTS   For appointments or questions about radiology appointments please call [phone];  Option 1 for CT, MRI, and ultrasounds.   Option 2 for PET scans.  Your ordering provider will contact you to discuss results.    MyChart Messages  For your safety and best care, please do not use MyChart messages for reporting symptoms or urgent messages.   Symptoms should be reported by calling the triage line above.   MyChart messages are checked only during weekday business hours.    Please use MyChart for non-urgent prescription refills, non-urgent scheduling issues, or non-urgent general questions/concerns.   Non-urgent concerns communicated via MyChart will be addressed within 24 business hours.   Please remember, clinic is only open from 8am-4:30pm and is closed over the weekend as well as major holidays.   MyChart messages will not be seen or answered during the times clinic is closed.  Note that MyChart messages may be routed a central pool. A team member of your provider will get back to you as quickly as possible.     Bethann Humble, RN

## 2023-06-28 DIAGNOSIS — Z940 Kidney transplant status: Principal | ICD-10-CM

## 2023-06-28 MED FILL — CYCLOSPORINE MODIFIED 25 MG CAPSULE: ORAL | 30 days supply | Qty: 300 | Fill #11

## 2023-06-28 NOTE — Unmapped (Signed)
[facility] Refill Coordination Note    Specialty Medication(s) to be Shipped:   Transplant: cyclosporine 25mg     Other medication(s) to be shipped: No additional medications requested for fill at this time     Gina Hart, DOB: 12/11/1975  Phone: [phone] (home)       All above HIPAA information was verified with patient.     Was a Nurse, learning disability used for this call? No    Completed refill call assessment today to schedule patient's medication shipment from the [facility]  [phone]).  All relevant notes have been reviewed.     Specialty medication(s) and dose(s) confirmed: Regimen is correct and unchanged.   Changes to medications: Gina Hart reports no changes at this time.  Changes to insurance: No  New side effects reported not previously addressed with a pharmacist or physician: None reported  Questions for the pharmacist: No    Confirmed patient received a Conservation officer, historic buildings and a Surveyor, mining with first shipment. The patient will receive a drug information handout for each medication shipped and additional FDA Medication Guides as required.       DISEASE/MEDICATION-SPECIFIC INFORMATION        N/A    SPECIALTY MEDICATION ADHERENCE     Medication Adherence    Specialty Medication: cycloSPORINE modified 25 MG capsule (NEORAL)  Patient is on additional specialty medications: No              Were doses missed due to medication being on hold? No    Cyclosporine Modified 25 mg: 4 days of medicine on hand       REFERRAL TO PHARMACIST     Referral to the pharmacist: Not needed      [name]     Shipping address confirmed in Epic.       Delivery Scheduled: Yes, Expected medication delivery date: 06/29/2023.     Medication will be delivered via UPS to the prescription address in Epic WAM.    Elnora Morrison, PharmD   [facility]  Specialty Pharmacist

## 2023-06-28 NOTE — Unmapped (Signed)
Pt paged on call TNC. She states she is going to be out of Cyclosporine in 3 days and is waiting for [facility] pharmacy to call back to discuss the delay. Placed call to [facility] myself and they will process order today and call her back to confirm shipment.

## 2023-07-05 LAB — CBC W/ DIFFERENTIAL
BASOPHILS ABSOLUTE COUNT: 0 10*3/uL — ABNORMAL LOW (ref 0.01–0.09)
BASOPHILS RELATIVE PERCENT: 0.7 % (ref 0.0–2.0)
EOSINOPHILS ABSOLUTE COUNT: 0.1
EOSINOPHILS RELATIVE PERCENT: 3 % (ref 1.0–8.0)
HEMATOCRIT: 38.3
HEMOGLOBIN: 13.4 (ref 11.9–16.0)
LYMPHOCYTES ABSOLUTE COUNT: 1 10*3/uL (ref 1.0–2.7)
LYMPHOCYTES RELATIVE PERCENT: 22.4 % (ref 20–45)
MEAN CORPUSCULAR HEMOGLOBIN CONC: 35 (ref 33.3–35.5)
MEAN CORPUSCULAR HEMOGLOBIN: 29.5 pg (ref 25.8–33.0)
MEAN CORPUSCULAR VOLUME: 84.2 fl (ref 78–95)
MEAN PLATELET VOLUME: 8 fl (ref 7.5–11.2)
MONOCYTES ABSOLUTE COUNT: 0.7 10*3/uL (ref 0.2–0.8)
MONOCYTES RELATIVE PERCENT: 14.3 % — ABNORMAL HIGH (ref 4.0–12.0)
NEUTROPHILS ABSOLUTE COUNT: 2.7 (ref 1.7–6.7)
NEUTROPHILS RELATIVE PERCENT: 59.6 % (ref 42–74)
PLATELET COUNT: 246 uL (ref 150–480)
RED BLOOD CELL COUNT: 4.6 (ref 4.0–5.3)
RED CELL DISTRIBUTION WIDTH: 13 % (ref 12.1–15.0)
WHITE BLOOD CELL COUNT: 4.6 (ref 4.0–10.5)

## 2023-07-05 LAB — URINALYSIS WITH MICROSCOPY WITH CULTURE REFLEX
BILIRUBIN UA: NEGATIVE
BLOOD UA: NEGATIVE
GLUCOSE UA: NEGATIVE
KETONES UA: NEGATIVE
LEUKOCYTE ESTERASE UA: NEGATIVE
NITRITE UA: NEGATIVE
PH UA: 6.5 (ref 4.5–8)
PROTEIN UA: NEGATIVE
SPECIFIC GRAVITY UA: 1.01 (ref 1.005–1.030)
UROBILINOGEN UA: 0.2

## 2023-07-05 LAB — BASIC METABOLIC PANEL
ANION GAP: 6.4 (ref 2.0–15.0)
BLOOD UREA NITROGEN: 21 — ABNORMAL HIGH
BUN / CREAT RATIO: 19.1 — ABNORMAL HIGH (ref 10.00–16.00)
CALCIUM: 9.5 (ref 8.5–10.1)
CHLORIDE: 103 (ref 100–108)
CO2: 28.6 mmol/L (ref 21–32)
CREATININE: 1.1
GLUCOSE RANDOM: 95
MAGNESIUM: 1.8 (ref 1.8–2.4)
OSMOLALITY CALCULATION: 289
PHOSPHORUS: 3.1 (ref 2.6–4.7)
POTASSIUM: 3.4 — ABNORMAL LOW (ref 3.5–5.1)
SODIUM: 138 mmol/L (ref 136–145)

## 2023-07-05 LAB — LIPID PANEL
CHOLESTEROL: 161 (ref 50–200)
HDL CHOLESTEROL: 64 — ABNORMAL HIGH (ref 35–60)
LDL CHOLESTEROL CALCULATED: 82 (ref 5.0–130.0)
LDL/HDL RATIO: 2.515
TRIGLYCERIDES: 75 (ref 30–200)

## 2023-07-05 LAB — RENAL FUNCTION PANEL: EGFR MDRD AF AMER: 62 mL/min

## 2023-07-09 DIAGNOSIS — Z940 Kidney transplant status: Principal | ICD-10-CM

## 2023-07-09 LAB — CYCLOSPORINE LEVEL: CYCLOSPORINE (FPIA) BLOOD: 197 ng/mL (ref 100–400)

## 2023-07-09 NOTE — Unmapped (Signed)
Returned patient's call.  She states she has the flu.  She was seen at Urgent Care this morning and diagnosed.  She was given Tamiflu to take.  Advised this was ok to take and safe for transplant.  She is generally feeling unwell, but notes she feels a little better than a few days ago with slight improvement in symptoms.  Advised to start Tamiflu and get plenty of rest and hydration.  Reviewed recent labs with patient also per her request.  No further questions/concerns at this time.

## 2023-07-11 DIAGNOSIS — Z940 Kidney transplant status: Principal | ICD-10-CM

## 2023-07-12 DIAGNOSIS — Z940 Kidney transplant status: Principal | ICD-10-CM

## 2023-07-20 ENCOUNTER — Inpatient Hospital Stay: Admit: 2023-07-20 | Discharge: 2023-07-21 | Payer: MEDICARE

## 2023-07-20 DIAGNOSIS — R059 Cough, unspecified type: Principal | ICD-10-CM

## 2023-07-21 NOTE — Unmapped (Unsigned)
Otolaryngology-Head and Neck Surgery Consult Note    Date of Service:  July 09, 2019  Requesting Attending Physician: Biagio Quint,*  Reason for Consult:  Symmetric intense hypermetabolic activity in thickened lingual tonsils.      Assessment:  No diagnosis found.      48 y.o. female with outside PET imaging that showed hypermetabolic activity in thickened lingual tonsils. Imaging was not available for my review. I have requested for imaging to be sent here at [facility] for a formal radiological read. FFL showed enlarged, but benign appearing, lingual tonsils bilaterally.No obvious masses.     Plan:  - Without outside PET imaging from 06/25/19 for my official review, we discussed a differential diagnosis of enlarged lingual tissue as posible inflammation. Will request the PET imaging here at [facility]  - Patient had congested nasal mucosa and enlarged IT on exam today. She does endorse mouth breathing and symptoms of nasal congestion.  Recommended that she use nasal sprays (flonase) QD for 1 month. Discussed daily and proper use of Flonase with her.  If symptoms persist, we discussed the possible need for a turbinate reduction in the future.   - Patient reports a prior history of sleep apnea. She does snore and does have episodes gasping for air. There is a question on physical exam whether or not the patient has had a UPPP in the past but the patient does not remember if shes had one or not. The patient is not currently using a CPAP machine and does not follow with anyone for her reported OSA. I recommended that she get a repeat sleep study and follow up with sleep medicine for her possible OSA.   - Follow up with me PRN  - The patient will call with any questions or concerns.     No chief complaint on file.      History of Present Illness:     Gina Hart is a 48 y.o. African-American pleasant female being seen in consultation at the request of Dr. Gordan Payment for evaluation and opinion of PET CT results from 06/25/19 which showed symmetric intense hypermetabolic activity in thickened lingual tonsils. Patient has a PMH of a right kidney transplant on 01/11/17 which is currently being managed by her nephrologist Dr. Margaretmary Bayley. Her last visit to nephrology on 06/05/19 she c/o nasal and sinus congestion persistent for 1 month prior. She further stated that she had no sense of taste or smell. COVID-19 test results were negative as of 06/10/19.  Dr. Margaretmary Bayley discussed abnormal lab findings regarding EBV viral load with the patient and planned for a PET CT which was performed on 06/25/19 at the medical mall at [facility]. Results showed symmetric intense hypermetabolic activity in thickened lingual tonsils.     Today, the patient presents to clinic and endorses a sore throat with loss of hearing in her left ear that began a few days before her recent PET scan. This has since resolved. When she wakes in the morning, she notices a change in color of phlegm that she clears from her nose/throat. She mentioned that she as had a sleep study in the past which reported that she has sleep apnea but does not use a CPAP machine. Furthermore, she mentioned that she snores which wakes herself up sometimes. She had a negative COVID test but endorses loss of smell/taste. Denies fever, dysphagia. Denies smoking/alcohol usage.     Modifying factors:  none  Other associated symptoms: Change in color of phlegm that she clears  from her nose/throat in the morning.  Severity is rated as mild    The patient denies fevers, weight loss, SOB, pain, dysphagia, odynophagia, hoarseness, hemoptysis, otalgia, or neck masses/growths.      Past Medical History    Tonsillectomy as a teenager    Past Medical History:   Diagnosis Date    ESRD (end stage renal disease) (CMS-HCC)     Focal segmental glomerulosclerosis 1998    Hypertension     Pulmonary hypertension (CMS-HCC)     Scoliosis (and kyphoscoliosis), idiopathic          Past Surgical History    Past Surgical History:   Procedure Laterality Date    BACK SURGERY      CHG US GUIDE, VASCULAR ACCESS Right 07/11/2013    Procedure: ULTRASOUND GUIDANCE FOR VASC ACCESS REQUIRING [place] EVAL OF POTENTIAL ACCESS SITES;  Surgeon: Alben Deeds, MD;  Location: MAIN OR [facility];  Service: Vascular    PR AV ANAST,FOREARM VEIN TRANSPOSITION Right 07/11/2013    Procedure: R. UE BRACHIOCEPHALIC FISTULA;  Surgeon: Alben Deeds, MD;  Location: MAIN OR [facility];  Service: Vascular    PR AV ANAST,UP ARM BASILIC VEIN TRANSPOSIT Right 07/11/2013    Procedure: BASILIC VEIN TRANSPOSITION;  Surgeon: Alben Deeds, MD;  Location: MAIN OR [facility];  Service: Vascular    PR RIGHT HEART CATH O2 SATURATION & CARDIAC OUTPUT N/A 06/16/2015    Procedure: Right Heart Catheterization;  Surgeon: Alvira Philips, MD;  Location: [facility];  Service: Cardiology    PR TRANSPLANTATION OF KIDNEY Right 01/11/2017    Procedure: RENAL ALLOTRANSPLANTATION, IMPLANTATION OF GRAFT; WITHOUT RECIPIENT NEPHRECTOMY;  Surgeon: Leona Carry, MD;  Location: [facility];  Service: Transplant         Medications      Current Outpatient Medications   Medication Sig Dispense Refill    acetaminophen (TYLENOL) 325 MG tablet Take 1-2 tablets (325-650 mg total) by mouth every four (4) hours as needed for pain. 100 tablet 0    amlodipine (NORVASC) 2.5 MG tablet Take 3 tablets (7.5 mg total) by mouth daily. 90 tablet 11    aspirin (ECOTRIN) 81 MG tablet TAKE 1 TABLET (81 MG) BY MOUTH DAILY 30 tablet 0    biotin 5 mg cap Take 1 capsule (5,000 mcg total) by mouth daily. 30 capsule 11    chlorthalidone (HYGROTON) 25 MG tablet Take 1 tablet (25 mg total) by mouth every morning. 30 tablet 11    cyclobenzaprine (FLEXERIL) 10 MG tablet Take 1 tablet (10 mg total) by mouth two (2) times a day as needed for muscle spasms. (Patient not taking: Reported on 04/25/2023) 45 tablet 0    cycloSPORINE modified (NEORAL) 25 MG capsule Take 5 capsules (125 mg total) by mouth two (2) times a day. 300 capsule 11    fluticasone propionate (FLONASE) 50 mcg/actuation nasal spray 1 spray by Each Nare route daily. 16 g 4    hydrOXYzine (ATARAX) 25 MG tablet Take 1 tablet (25 mg total) by mouth nightly as needed. (Patient not taking: Reported on 04/25/2023) 30 tablet 3    loratadine (CLARITIN) 10 mg tablet Take 10 mg by mouth daily as needed for allergies.      magnesium oxide (MAG-OX) 400 mg (241.3 mg elemental magnesium) tablet Take 1 tablet (400 mg total) by mouth daily. 30 tablet 11    melatonin 5 mg tablet Take 1 tablet (5 mg total) by mouth nightly as needed. 30 tablet 11  metoPROLOL tartrate (LOPRESSOR) 100 MG tablet TAKE ONE TABLET TWICE DAILY 180 tablet 3    potassium chloride 20 MEQ ER tablet Take 1 tablet (20 mEq total) by mouth two (2) times a day. 30 tablet 11    predniSONE (DELTASONE) 5 MG tablet Take 1 tablet (5 mg total) by mouth daily. 30 tablet 11    rOPINIRole (REQUIP) 0.25 MG tablet TAKE ONE TABLET BY MOUTH 1-3 HOURS BEFORE BEDTIME. (Patient not taking: Reported on 04/25/2023) 30 tablet 0    rosuvastatin (CRESTOR) 5 MG tablet Take 1 tablet (5 mg total) by mouth daily. 90 tablet 3    traZODone (DESYREL) 50 MG tablet Take 2 tablets (100 mg total) by mouth nightly. 60 tablet 11    WEGOVY 0.25 MG/0.5 ML SUBCUTANEOUS PEN INJECTOR Inject 0.25 mg under the skin every seven (7) days. (Patient not taking: Reported on 04/25/2023) 2 mL 11     No current facility-administered medications for this visit.         Allergies    Ibuprofen and Sulfa (sulfonamide antibiotics)      Social History:    Tobacco use:  reports that she has never smoked. She has never used smokeless tobacco.  Alcohol use:  reports no history of alcohol use.  Drug use:  reports no history of drug use.      Family History    The patient's family history includes Hypertension in her mother; No Known Problems in her daughter, father, maternal grandfather, maternal grandmother, paternal grandfather, paternal grandmother, and sister..      Review of Systems  A 10 system review of systems was negative except as noted in HPI and intake encounter form, which was reviewed and scanned into the media section of the medical record      Objective     Vital Signs  LMP 04/30/2016       Physical Exam    Vitals signs reviewed in the nursing chart  General:  WD, WN, well groomed, pleasant female, sitting up in NAD, normal appearance and voice, communicates appropriately, no stridor.  Psychiatric/Neuro:  A&Ox3, Normal mood and affect, Following commands, MAE. Cranial nerves 2-12 intact, No focal deficits.   Head and Face:  AT, Asher, no asymmetry, Skin with no masses or lesions, sinuses nontender to palpation.  Parotid and submandibular glands normal bilaterally.  Facial strength normal bilaterally.   Eyes:  EOM Intact, sclera anicteric, no conjunctival injection, PERRL.   Nose:  Normal external nasal pyramid, anterior rhinoscopy shows normal  mucosa, septum, and turbinates.   Ears:  Normal auricles, EAC's, and TM's to otoscopy.  TM's intact and mobile, Middle ears aerated bilaterally.  Hearing:  Normal hearing to whispered voice.  Oral Cavity:   Normal lips, gingiva, tongue, hard palate, and dentition; pink, moist mucosa, no lesions, tongue is mobile.  Floor of mouth is soft.  Oropharynx:  Congestion of inferior turbinate L>R. Surgical absence of uvula and tonsils. No masses palpated. No lesions. BOT soft, soft palate and posterior pharynx, symmetric pink, moist mucosa  Neck:  No masses, no thyroid nodules, no thyromegaly, trachea midline. Normal laryngeal crepitus.  Lymphatics:  No cervical lymphadenopathy.  Respiratory:  Normal respiratory effort, symmetric chest rise, no accessory muscle usage.  Cardiovascular:  No clubbing, cyanosis, swelling, edema or varicosities in extremities, Regular Rate.      Laryngoscopy  Procedure Note    Pre-operative Diagnosis:   No diagnosis found.      Post-operative Diagnosis: same    Anesthesia: none  Surgeon: Epimenio Sarin. Faatimah Spielberg    Endoscopy Type:  Flexible Fiberoptic Laryngoscopy    Laryngoscope E (serial number [id number]) was used during this visit on July 09, 2019.     Indications: To better evaluate the patient???s tumor site    Procedure Details:    The patient was placed in the sitting position.  After topical anesthesia and decongestion, the 4 mm laryngoscope was passed.  The nasal cavities, nasopharynx, oropharynx, hypopharynx, and larynx were all examined.  Vocal cords were examined during respiration and phonation.  The following findings were noted:    Findings:  Nasal cavities:  Enlarged inferior turbinates L>R. Normal mucosa, patent, no masses, lesions, and septum. Congested nasal mucosa.  Nasopharynx:  Normal pink moist mucosa, normal eustachian tubes, no masses.  Oropharynx:  Symmericly enlarged lingual tonsils. Tonsils filling the vallecula and slightly causing a retroflexed epiglottis. No obvious masses. Normal palate, pharyngeal wall, and base of tongue, mucosa pink and moist  Hypopharynx:  Normal pharyngeal walls and pyriform sinuses, no pooling of secretions  Larynx:  Supraglottis, false and true vocal cord are normal.  Vocal cord mobility is normal.  Subglottis is patent.    Condition:  Stable.  Patient tolerated procedure well.    Complications:  None                        Test Results    none      Imaging    I have personally reviewed all pertinent imaging results.    06/25/19 PET CT Skull Base to Thigh:  IMPRESSION:  1. No hypermetabolic or enlarged lymph nodes skull base to thigh by FDG PET scan  2. Symmetric intense hypermetabolic in thickened lingual tonsils. Recommend clinical correlation  3. Normal spleen and marrow  4. Renal transplant in the right iliac fossa      Scribe's Attestation:  Cheryle Horsfall, MD obtained and performed the history, physical exam and medical decision making elements that were entered into the chart. Signed by Janina Mayo, Scribe, on July 09, 2019 at 9:10 AM.    ----------------------------------------------------------------------------------------------------------------------  July 17, 2019 9:14 AM. Documentation assistance provided by the Scribe. I was present during the time the encounter was recorded. The information recorded by the Scribe was done at my direction and has been reviewed and validated by me.  ----------------------------------------------------------------------------------------------------------------------

## 2023-07-27 DIAGNOSIS — Z940 Kidney transplant status: Principal | ICD-10-CM

## 2023-07-27 MED ORDER — CYCLOSPORINE MODIFIED 25 MG CAPSULE
ORAL_CAPSULE | Freq: Two times a day (BID) | ORAL | 11 refills | 30.00 days | Status: CP
Start: 2023-07-27 — End: 2024-07-26
  Filled 2023-07-31: qty 300, 30d supply, fill #0

## 2023-07-27 NOTE — Unmapped (Signed)
 The patient is requesting a medication refill

## 2023-07-30 NOTE — Unmapped (Signed)
[facility] Refill Coordination Note    Specialty Medication(s) to be Shipped:   Transplant: cyclosporine 25mg     Other medication(s) to be shipped: No additional medications requested for fill at this time     Gina Hart, DOB: 26-Jan-1976  Phone: [phone] (home)       All above HIPAA information was verified with patient.     Was a Nurse, learning disability used for this call? No    Completed refill call assessment today to schedule patient's medication shipment from the [facility] Delivery Pharm[facility]  [phone]).  All relevant notes have been reviewed.     Specialty medication(s) and dose(s) confirmed: Regimen is correct and unchanged.   Changes to medications: Shravya reports no changes at this time.  Changes to insurance: No  New side effects reported not previously addressed with a pharmacist or physician: None reported  Questions for the pharmacist: No    Confirmed patient received a Conservation officer, historic buildings and a Surveyor, mining with first shipment. The patient will receive a drug information handout for each medication shipped and additional FDA Medication Guides as required.       DISEASE/MEDICATION-SPECIFIC INFORMATION        N/A    SPECIALTY MEDICATION ADHERENCE     Medication Adherence    Patient reported X missed doses in the last month: 0  Specialty Medication: cycloSPORINE modified 25 MG capsule (NEORAL)  Patient is on additional specialty medications: No  Patient is on more than two specialty medications: No  Any gaps in refill history greater than 2 weeks in the last 3 months: no  Demonstrates understanding of importance of adherence: yes  Informant: patient  Reliability of informant: reliable  Provider-estimated medication adherence level: good  Patient is at risk for Non-Adherence: No  Reasons for non-adherence: no problems identified  Confirmed plan for next specialty medication refill: delivery by pharmacy  Refills needed for supportive medications: not needed Refill Coordination    Has the Patients' Contact Information Changed: No  Is the Shipping Address Different: No         Were doses missed due to medication being on hold? No    Cyclosporine  25 mg: 6days of medicine on hand       REFERRAL TO PHARMACIST     Referral to the pharmacist: Not needed      [facility]     Shipping address confirmed in Epic.       Delivery Scheduled: Yes, Expected medication delivery date: 02/05.     Medication will be delivered via UPS to the prescription address in Epic WAM.    [facility]  Specialty Technician

## 2023-08-16 LAB — BASIC METABOLIC PANEL
ANION GAP: 9
BLOOD UREA NITROGEN: 25 — ABNORMAL HIGH
BUN / CREAT RATIO: 25 — ABNORMAL HIGH
CALCIUM: 10
CHLORIDE: 101
CO2: 29
CREATININE: 1
EGFR CKD-EPI AA FEMALE: 69
GLUCOSE RANDOM: 100 — ABNORMAL HIGH
MAGNESIUM: 1.8
OSMOLALITY CALCULATION: 292
PHOSPHORUS: 3.1
POTASSIUM: 3.4 — ABNORMAL LOW
SODIUM: 139

## 2023-08-16 LAB — CBC W/ DIFFERENTIAL
BASOPHILS ABSOLUTE COUNT: 0 — ABNORMAL LOW
BASOPHILS RELATIVE PERCENT: 0.8
EOSINOPHILS ABSOLUTE COUNT: 0.1
EOSINOPHILS RELATIVE PERCENT: 2.6
HEMATOCRIT: 39.3
HEMOGLOBIN: 13.4
LYMPHOCYTES ABSOLUTE COUNT: 1.1
LYMPHOCYTES RELATIVE PERCENT: 26.2
MEAN CORPUSCULAR HEMOGLOBIN CONC: 34
MEAN CORPUSCULAR HEMOGLOBIN: 28.8
MEAN CORPUSCULAR VOLUME: 84.9
MEAN PLATELET VOLUME: 8.3
MONOCYTES ABSOLUTE COUNT: 0.6
MONOCYTES RELATIVE PERCENT: 15.9 — ABNORMAL HIGH
NEUTROPHILS ABSOLUTE COUNT: 2.2
NEUTROPHILS RELATIVE PERCENT: 54.5
PLATELET COUNT: 235
RED BLOOD CELL COUNT: 4.6
RED CELL DISTRIBUTION WIDTH: 13.7
WHITE BLOOD CELL COUNT: 4

## 2023-08-16 LAB — CYCLOSPORINE, TROUGH: CYCLOSPORINE, TROUGH: 174

## 2023-08-16 LAB — CHOLESTEROL, TOTAL: CHOLESTEROL: 179

## 2023-08-16 LAB — URINALYSIS WITH MICROSCOPY
BILIRUBIN UA: NEGATIVE
GLUCOSE UA: NEGATIVE
KETONES UA: NEGATIVE
LEUKOCYTE ESTERASE UA: NEGATIVE
NITRITE UA: NEGATIVE
PH UA: 6
PROTEIN UA: NEGATIVE
SPECIFIC GRAVITY UA: 1.01
UROBILINOGEN UA: 0.2

## 2023-08-16 LAB — TRIGLYCERIDES: TRIGLYCERIDES: 86

## 2023-08-16 LAB — HDL CHOLESTEROL: HDL CHOLESTEROL: 69 — ABNORMAL HIGH

## 2023-08-16 LAB — LDL CHOLESTEROL, DIRECT: LDL CHOLESTEROL DIRECT: 88

## 2023-08-17 DIAGNOSIS — Z940 Kidney transplant status: Principal | ICD-10-CM

## 2023-08-17 NOTE — Unmapped (Signed)
 Spoke with patient about how she is doing.  Advised of low level of E Coli in urine culture (20,000 - 30,000).  She currently denies any UTI symptoms, but is aware to reach out to [facility] if any new or concerning symptoms.

## 2023-08-21 DIAGNOSIS — Z940 Kidney transplant status: Principal | ICD-10-CM

## 2023-08-21 DIAGNOSIS — N390 Urinary tract infection, site not specified: Principal | ICD-10-CM

## 2023-08-21 MED ORDER — CEPHALEXIN 500 MG CAPSULE
ORAL_CAPSULE | Freq: Two times a day (BID) | ORAL | 0 refills | 10.00 days | Status: CP
Start: 2023-08-21 — End: 2023-08-31

## 2023-08-21 NOTE — Unmapped (Signed)
 Spoke with patient about new onset of UTI symptoms.  Per Dr Margaretmary Bayley, patient to start Keflex BID x 7 days.  Script sent to [facility] per patient's request.  Patient will repeat urine sample also.

## 2023-08-28 DIAGNOSIS — Z940 Kidney transplant status: Principal | ICD-10-CM

## 2023-08-28 MED ORDER — FLUCONAZOLE 150 MG TABLET
ORAL_TABLET | Freq: Once | ORAL | 0 refills | 1.00 days | Status: CP
Start: 2023-08-28 — End: 2023-08-28

## 2023-08-29 DIAGNOSIS — Z940 Kidney transplant status: Principal | ICD-10-CM

## 2023-08-29 MED ORDER — FLUCONAZOLE 150 MG TABLET
ORAL_TABLET | Freq: Once | ORAL | 0 refills | 1.00 days | Status: CP
Start: 2023-08-29 — End: 2023-08-29

## 2023-08-30 NOTE — Unmapped (Signed)
 [facility] Delivery [facility] Clinical Assessment & Refill Coordination Note    Gina Hart, DOB: 1975/07/12  Phone: [phone] (home)     All above HIPAA information was verified with patient.     Was a Nurse, learning disability used for this call? No    Specialty Medication(s):   Transplant: cyclosporine 25mg      Current Outpatient Medications   Medication Sig Dispense Refill    acetaminophen (TYLENOL) 325 MG tablet Take 1-2 tablets (325-650 mg total) by mouth every four (4) hours as needed for pain. 100 tablet 0    amlodipine (NORVASC) 2.5 MG tablet Take 3 tablets (7.5 mg total) by mouth daily. 90 tablet 11    aspirin (ECOTRIN) 81 MG tablet TAKE 1 TABLET (81 MG) BY MOUTH DAILY 30 tablet 0    biotin 5 mg cap Take 1 capsule (5,000 mcg total) by mouth daily. 30 capsule 11    cephalexin (KEFLEX) 500 MG capsule Take 1 capsule (500 mg total) by mouth two (2) times a day for 10 days. 20 capsule 0    chlorthalidone (HYGROTON) 25 MG tablet Take 1 tablet (25 mg total) by mouth every morning. 30 tablet 11    cyclobenzaprine (FLEXERIL) 10 MG tablet Take 1 tablet (10 mg total) by mouth two (2) times a day as needed for muscle spasms. (Patient not taking: Reported on 04/25/2023) 45 tablet 0    cycloSPORINE modified (NEORAL) 25 MG capsule Take 5 capsules (125 mg total) by mouth two (2) times a day. 300 capsule 11    fluticasone propionate (FLONASE) 50 mcg/actuation nasal spray 1 spray by Each Nare route daily. 16 g 4    hydrOXYzine (ATARAX) 25 MG tablet Take 1 tablet (25 mg total) by mouth nightly as needed. (Patient not taking: Reported on 04/25/2023) 30 tablet 3    loratadine (CLARITIN) 10 mg tablet Take 10 mg by mouth daily as needed for allergies.      magnesium oxide (MAG-OX) 400 mg (241.3 mg elemental magnesium) tablet Take 1 tablet (400 mg total) by mouth daily. 30 tablet 11    melatonin 5 mg tablet Take 1 tablet (5 mg total) by mouth nightly as needed. 30 tablet 11    metoPROLOL tartrate (LOPRESSOR) 100 MG tablet TAKE ONE TABLET TWICE DAILY 180 tablet 3    potassium chloride 20 MEQ ER tablet Take 1 tablet (20 mEq total) by mouth two (2) times a day. 30 tablet 11    predniSONE (DELTASONE) 5 MG tablet Take 1 tablet (5 mg total) by mouth daily. 30 tablet 11    rOPINIRole (REQUIP) 0.25 MG tablet TAKE ONE TABLET BY MOUTH 1-3 HOURS BEFORE BEDTIME. (Patient not taking: Reported on 04/25/2023) 30 tablet 0    rosuvastatin (CRESTOR) 5 MG tablet Take 1 tablet (5 mg total) by mouth daily. 90 tablet 3    traZODone (DESYREL) 50 MG tablet Take 2 tablets (100 mg total) by mouth nightly. 60 tablet 11    WEGOVY 0.25 MG/0.5 ML SUBCUTANEOUS PEN INJECTOR Inject 0.25 mg under the skin every seven (7) days. (Patient not taking: Reported on 04/25/2023) 2 mL 11     No current facility-administered medications for this visit.        Changes to medications: Kymberlee reports no changes at this time.    Medication list has been reviewed and updated in Epic: Yes    Allergies   Allergen Reactions    Ibuprofen Swelling     Other reaction(s): SWELLING/EDEMA    Sulfa (Sulfonamide  Antibiotics)        Changes to allergies: No    Allergies have been reviewed and updated in Epic: Yes    SPECIALTY MEDICATION ADHERENCE     Cyclosporine 25mg   : 7 days of medicine on hand       Medication Adherence    Patient reported X missed doses in the last month: 0  Specialty Medication: cyclosporine 25mg   Patient is on additional specialty medications: No          Specialty medication(s) dose(s) confirmed: Regimen is correct and unchanged.     Are there any concerns with adherence? No    Adherence counseling provided? Not needed    CLINICAL MANAGEMENT AND INTERVENTION      Clinical Benefit Assessment:    Do you feel the medicine is effective or helping your condition? Yes    Clinical Benefit counseling provided? Not needed    Adverse Effects Assessment:    Are you experiencing any side effects? No    Are you experiencing difficulty administering your medicine? No    Quality of Life Assessment:    Quality of Life    Rheumatology  Oncology  Dermatology  Cystic Fibrosis          How many days over the past month did your transplant  keep you from your normal activities? For example, brushing your teeth or getting up in the morning. 0    Have you discussed this with your provider? Not needed    Acute Infection Status:    Acute infections noted within Epic:  No active infections    Patient reported infection: None    Therapy Appropriateness:    Is therapy appropriate based on current medication list, adverse reactions, adherence, clinical benefit and progress toward achieving therapeutic goals? Yes, therapy is appropriate and should be continued     Clinical Intervention:    Was an intervention completed as part of this clinical assessment? No    DISEASE/MEDICATION-SPECIFIC INFORMATION      N/A    Solid Organ Transplant: Not Applicable    PATIENT SPECIFIC NEEDS     Does the patient have any physical, cognitive, or cultural barriers? No    Is the patient high risk? No    Does the patient require physician intervention or other additional services (i.e., nutrition, smoking cessation, social work)? No    Does the patient have an additional or emergency contact listed in their chart? Yes    SOCIAL DETERMINANTS OF HEALTH     At the [facility], we have learned that life circumstances - like trouble affording food, housing, utilities, or transportation can affect the health of many of our patients.   That is why we wanted to ask: are you currently experiencing any life circumstances that are negatively impacting your health and/or quality of life? Patient declined to answer    Social Drivers of Health     Food Insecurity: Not on file   Internet Connectivity: Not on file   Housing/Utilities: Not on file   Tobacco Use: Low Risk  (08/30/2022)    Patient History     Smoking Tobacco Use: Never     Smokeless Tobacco Use: Never     Passive Exposure: Not on file   Transportation Needs: Not on file   Alcohol Use: Not on file   Interpersonal Safety: Not on file   Physical Activity: Not on file   Intimate Partner Violence: Not on file   Stress: Not on file  Substance Use: Not on file (05/01/2023)   Social Connections: Not on file   Financial Resource Strain: Not on file   Depression: Not on file   Health Literacy: Not on file       Would you be willing to receive help with any of the needs that you have identified today? Not applicable       SHIPPING     Specialty Medication(s) to be Shipped:   Transplant: cyclosporine 25mg     Other medication(s) to be shipped: No additional medications requested for fill at this time     Changes to insurance: No    Delivery Scheduled: Yes, Expected medication delivery date: 09/03/2023.     Medication will be delivered via UPS to the confirmed prescription address in [facility].    The patient will receive a drug information handout for each medication shipped and additional FDA Medication Guides as required.  Verified that patient has previously received a Conservation officer, historic buildings and a Surveyor, mining.    The patient or caregiver noted above participated in the development of this care plan and knows that they can request review of or adjustments to the care plan at any time.      All of the patient's questions and concerns have been addressed.    Thad Ranger, PharmD   [facility] Specialty Pharmacist

## 2023-08-31 MED FILL — CYCLOSPORINE MODIFIED 25 MG CAPSULE: ORAL | 30 days supply | Qty: 300 | Fill #1

## 2023-09-07 DIAGNOSIS — Z940 Kidney transplant status: Principal | ICD-10-CM

## 2023-09-12 DIAGNOSIS — Z940 Kidney transplant status: Principal | ICD-10-CM

## 2023-09-12 NOTE — Unmapped (Signed)
 Referral placed to long COVID clinic at [facility] per patient's request.

## 2023-09-17 LAB — URINALYSIS WITH MICROSCOPY WITH CULTURE REFLEX
BILIRUBIN UA: NEGATIVE
GLUCOSE UA: NEGATIVE
KETONES UA: NEGATIVE
LEUKOCYTE ESTERASE UA: NEGATIVE
NITRITE UA: NEGATIVE
PH UA: 6.5 (ref 4.5–8)
PROTEIN UA: NEGATIVE
SPECIFIC GRAVITY UA: 1.015 (ref 1.005–1.030)
UROBILINOGEN UA: 0.2

## 2023-09-17 LAB — CBC W/ DIFFERENTIAL
BASOPHILS ABSOLUTE COUNT: 0 10*3/uL — ABNORMAL LOW (ref 0.01–0.09)
BASOPHILS RELATIVE PERCENT: 1.3 % (ref 0.0–2.0)
EOSINOPHILS ABSOLUTE COUNT: 0.1 (ref 0.04–0.5)
EOSINOPHILS RELATIVE PERCENT: 2.3 % (ref 1.0–8.0)
HEMATOCRIT: 37.8
HEMOGLOBIN: 13.3 (ref 11.9–16.0)
LYMPHOCYTES ABSOLUTE COUNT: 0.9 — ABNORMAL LOW (ref 1.0–2.7)
LYMPHOCYTES RELATIVE PERCENT: 24.6 % (ref 20–45)
MEAN CORPUSCULAR HEMOGLOBIN CONC: 35.3 (ref 33.3–35.5)
MEAN CORPUSCULAR HEMOGLOBIN: 29.6 pg (ref 25.8–33.0)
MEAN CORPUSCULAR VOLUME: 84 fl (ref 78–95)
MEAN PLATELET VOLUME: 7.8 fl (ref 7.5–11.2)
MONOCYTES ABSOLUTE COUNT: 0.6 uL (ref 0.2–0.8)
MONOCYTES RELATIVE PERCENT: 15.8 % — ABNORMAL HIGH (ref 4.0–12.0)
NEUTROPHILS ABSOLUTE COUNT: 2
NEUTROPHILS RELATIVE PERCENT: 56 % (ref 42–74)
PLATELET COUNT: 259 uL (ref 150–480)
RED BLOOD CELL COUNT: 4.5 10*6/uL (ref 4.0–5.3)
RED CELL DISTRIBUTION WIDTH: 13.4 % (ref 12.1–15.0)
WHITE BLOOD CELL COUNT: 3.6 — ABNORMAL LOW

## 2023-09-20 DIAGNOSIS — Z940 Kidney transplant status: Principal | ICD-10-CM

## 2023-09-20 MED ORDER — CHLORTHALIDONE 25 MG TABLET
ORAL_TABLET | Freq: Every morning | ORAL | 3 refills | 90 days | Status: CP
Start: 2023-09-20 — End: 2024-09-19

## 2023-09-26 NOTE — Progress Notes (Signed)
 Otolaryngology-Head and Neck Surgery Consult Note    Date of Service:  09/28/2023    Assessment:  1. Neck pain    2. Nasal obstruction          48 y.o. female with history of renal transplant in 2018, who presented to this clinic in 2021 for symmetric intense hypermetabolic activity in thickened lingual tonsils on PET with reassuring laryngoscopy findings and negative PET scan in 2024, now presenting for nasal congestion in the setting of bilateral IT hypertrophy and mild right septal deviation    Plan:  - Recommended that she continue using nasal sprays (flonase) every day, but add nasal saline irrigations QD. Discussed daily and proper use of Flonase with her.   - Given that her symptoms have persisted despite flonase use, we discussed the possible need for a turbinate reduction in the future.   - Referral to rhinology/general placed   - Patient reports a prior history of sleep apnea. There is a question on physical exam whether or not the patient has had a UPPP in the past but the patient does not remember if shes had one or not. She states that she has undergone a sleep study recently that reportedly showed no significant sleep apnea  - Follow up with me PRN  - The patient will call with any questions or concerns.     Chief Complaint   Patient presents with    Follow-up       History of Present Illness:     Vicki Mercer is a 48 y.o. African-American pleasant female being seen in consultation at the request of Dr. Gordan Payment for evaluation and opinion of nasal congestion.     She was previously seen in clinic for symmetric intense hypermetabolic activity in thickened lingual tonsils on PET in 2021. At that point, a laryngoscopy showed symmetrically enlarged lingual tonsils but no concerning findings. A repeat PET in 2024 showed no activity in the lingual tonsils.     Today she presents with complaints of long standing nasal congestion. She endorses difficulty breathing through the nose on both sides. She has tried flonase daily but has seldom tried nasal saline irrigations. She denies using Afrin daily. She has a history of sleep apnea. She denies a history of trauma or surgery to the nose. She is unsure about undergoing a UPPP in the past but did have a tonsillectomy in 2001. She says a recent sleep study showed no significant sleep apnea.     The patient denies fevers, weight loss, SOB, pain, dysphagia, odynophagia, hoarseness, hemoptysis, otalgia, or neck masses/growths.      Past Medical History    Tonsillectomy as a teenager    Past Medical History:   Diagnosis Date    ESRD (end stage renal disease)     Focal segmental glomerulosclerosis 1998    Hypertension     Pulmonary hypertension     Scoliosis (and kyphoscoliosis), idiopathic          Past Surgical History    Past Surgical History:   Procedure Laterality Date    BACK SURGERY      CHG US GUIDE, VASCULAR ACCESS Right 07/11/2013    Procedure: ULTRASOUND GUIDANCE FOR VASC ACCESS REQUIRING [place] EVAL OF POTENTIAL ACCESS SITES;  Surgeon: Alben Deeds, MD;  Location: MAIN OR [facility];  Service: Vascular    PR AV ANAST,FOREARM VEIN TRANSPOSITION Right 07/11/2013    Procedure: R. UE BRACHIOCEPHALIC FISTULA;  Surgeon: Alben Deeds, MD;  Location: MAIN OR [place];  Service: Vascular    PR AV ANAST,UP ARM BASILIC VEIN TRANSPOSIT Right 07/11/2013    Procedure: BASILIC VEIN TRANSPOSITION;  Surgeon: Alben Deeds, MD;  Location: MAIN OR [facility];  Service: Vascular    PR RIGHT HEART CATH O2 SATURATION & CARDIAC OUTPUT N/A 06/16/2015    Procedure: Right Heart Catheterization;  Surgeon: Alvira Philips, MD;  Location: [facility];  Service: Cardiology    PR TRANSPLANTATION OF KIDNEY Right 01/11/2017    Procedure: RENAL ALLOTRANSPLANTATION, IMPLANTATION OF GRAFT; WITHOUT RECIPIENT NEPHRECTOMY;  Surgeon: Leona Carry, MD;  Location: MAIN OR [facility];  Service: Transplant         Medications      Current Outpatient Medications   Medication Sig Dispense Refill    acetaminophen (TYLENOL) 325 MG tablet Take 1-2 tablets (325-650 mg total) by mouth every four (4) hours as needed for pain. 100 tablet 0    amlodipine (NORVASC) 2.5 MG tablet Take 3 tablets (7.5 mg total) by mouth daily. 90 tablet 11    aspirin (ECOTRIN) 81 MG tablet TAKE 1 TABLET (81 MG) BY MOUTH DAILY 30 tablet 0    biotin 5 mg cap Take 1 capsule (5,000 mcg total) by mouth daily. 30 capsule 11    chlorthalidone (HYGROTON) 25 MG tablet Take 1 tablet (25 mg total) by mouth every morning. 90 tablet 3    cyclobenzaprine (FLEXERIL) 10 MG tablet Take 1 tablet (10 mg total) by mouth two (2) times a day as needed for muscle spasms. (Patient not taking: Reported on 04/25/2023) 45 tablet 0    cycloSPORINE modified (NEORAL) 25 MG capsule Take 5 capsules (125 mg total) by mouth two (2) times a day. 300 capsule 11    fluticasone propionate (FLONASE) 50 mcg/actuation nasal spray 1 spray by Each Nare route daily. 16 g 4    hydrOXYzine (ATARAX) 25 MG tablet Take 1 tablet (25 mg total) by mouth nightly as needed. (Patient not taking: Reported on 04/25/2023) 30 tablet 3    loratadine (CLARITIN) 10 mg tablet Take 10 mg by mouth daily as needed for allergies.      magnesium oxide (MAG-OX) 400 mg (241.3 mg elemental magnesium) tablet Take 1 tablet (400 mg total) by mouth daily. 30 tablet 11    melatonin 5 mg tablet Take 1 tablet (5 mg total) by mouth nightly as needed. 30 tablet 11    metoPROLOL tartrate (LOPRESSOR) 100 MG tablet TAKE ONE TABLET TWICE DAILY 180 tablet 3    potassium chloride 20 MEQ ER tablet Take 1 tablet (20 mEq total) by mouth two (2) times a day. 30 tablet 11    predniSONE (DELTASONE) 5 MG tablet Take 1 tablet (5 mg total) by mouth daily. 30 tablet 11    rOPINIRole (REQUIP) 0.25 MG tablet TAKE ONE TABLET BY MOUTH 1-3 HOURS BEFORE BEDTIME. (Patient not taking: Reported on 04/25/2023) 30 tablet 0    rosuvastatin (CRESTOR) 5 MG tablet Take 1 tablet (5 mg total) by mouth daily. 90 tablet 3    traZODone (DESYREL) 50 MG tablet Take 2 tablets (100 mg total) by mouth nightly. 60 tablet 11    WEGOVY 0.25 MG/0.5 ML SUBCUTANEOUS PEN INJECTOR Inject 0.25 mg under the skin every seven (7) days. (Patient not taking: Reported on 04/25/2023) 2 mL 11     No current facility-administered medications for this visit.         Allergies    Ibuprofen and Sulfa (sulfonamide antibiotics)      Social History:  Tobacco use:  reports that she has never smoked. She has never used smokeless tobacco.  Alcohol use:  reports no history of alcohol use.  Drug use:  reports no history of drug use.      Family History    The patient's family history includes Hypertension in her mother; No Known Problems in her daughter, father, maternal grandfather, maternal grandmother, paternal grandfather, paternal grandmother, and sister..      Review of Systems  A 10 system review of systems was negative except as noted in HPI and intake encounter form, which was reviewed and scanned into the media section of the medical record      Objective     Vital Signs  BP 122/67 (BP Site: L Arm, BP Position: Sitting)  - Pulse 74  - Temp 36.5 ??C (97.7 ??F) (Temporal)  - Resp 18  - Ht 149.9 cm (4' 11)  - Wt 96.5 kg (212 lb 11.2 oz)  - LMP 04/30/2016  - SpO2 97%  - BMI 42.96 kg/m??       Physical Exam    Vitals signs reviewed in the nursing chart  General:  WD, WN, well groomed, pleasant female, sitting up in NAD, normal appearance and voice, communicates appropriately, no stridor.  Psychiatric/Neuro:  A&Ox3, Normal mood and affect, Following commands, MAE. Cranial nerves 2-12 intact, No focal deficits.   Head and Face:  AT, [place], no asymmetry, Skin with no masses or lesions, sinuses nontender to palpation.  Parotid and submandibular glands normal bilaterally.  Facial strength normal bilaterally.   Eyes:  EOM Intact, sclera anicteric, no conjunctival injection, PERRL.   Nose:  Normal external nasal pyramid, anterior rhinoscopy shows enlarged IT bilaterally   Ears:  Normal auricles, EAC's, and TM's to otoscopy.  TM's intact and mobile, Middle ears aerated bilaterally.  Hearing:  Normal hearing to whispered voice.  Oral Cavity:   Normal lips, gingiva, tongue, hard palate, and dentition; pink, moist mucosa, no lesions, tongue is mobile.  Floor of mouth is soft.  Oropharynx:  Congestion of inferior turbinate R>L. Surgical absence of uvula and tonsils. No lesions. BOT soft, soft palate and posterior pharynx, symmetric pink, moist mucosa  Neck:  No masses, no thyroid nodules, no thyromegaly, trachea midline. Normal laryngeal crepitus.  Lymphatics:  No cervical lymphadenopathy.  Respiratory:  Normal respiratory effort, symmetric chest rise, no accessory muscle usage.  Cardiovascular:  No clubbing, cyanosis, swelling, edema or varicosities in extremities, Regular Rate.      Nasal endoscopy Procedure Note    Pre-operative Diagnosis:   1. Neck pain    2. Nasal obstruction      Post-operative Diagnosis: same    Anesthesia: none    Surgeon: Epimenio Sarin. Aviance [place]    Endoscopy Type:  Flexible Fiberoptic Nasal Endoscopy    Laryngoscope serial number [id number] was used     Indications: To better evaluate the patient???s nasal congestion    Procedure Details:    The patient was placed in the sitting position.  The 4 mm laryngoscope was passed.  The nasal cavities were examined. The following findings were noted:    Findings:  Nasal cavities:  Enlarged inferior turbinates R>L. Mild right septal deviation. Normal mucosa, patent, no masses, lesions. Congested nasal mucosa.  Nasopharynx:  Normal pink moist mucosa, normal eustachian tubes, no masses.    Condition:  Stable.  Patient tolerated procedure well.    Complications:  None    Test Results    none  Imaging    I have personally reviewed all pertinent imaging results.

## 2023-09-26 NOTE — Unmapped (Signed)
 Otolaryngology-Head and Neck Surgery Consult Note    Date of Service:  09/28/2023    Assessment:  1. Neck pain    2. Nasal obstruction          48 y.o. female with history of renal transplant in 2018, who presented to this clinic in 2021 for symmetric intense hypermetabolic activity in thickened lingual tonsils on PET with reassuring laryngoscopy findings and negative PET scan in 2024, now presenting for nasal congestion in the setting of bilateral IT hypertrophy and mild right septal deviation    Plan:  - Recommended that she continue using nasal sprays (flonase) every day, but add nasal saline irrigations QD. Discussed daily and proper use of Flonase with her.   - Given that her symptoms have persisted despite flonase use, we discussed the possible need for a turbinate reduction in the future.   - Referral to rhinology/general placed   - Patient reports a prior history of sleep apnea. There is a question on physical exam whether or not the patient has had a UPPP in the past but the patient does not remember if shes had one or not. She states that she has undergone a sleep study recently that reportedly showed no significant sleep apnea  - Follow up with me PRN  - The patient will call with any questions or concerns.     Chief Complaint   Patient presents with    Follow-up       History of Present Illness:     Gina Hart is a 48 y.o. African-American pleasant female being seen in consultation at the request of Dr. Gordan Payment for evaluation and opinion of nasal congestion.     She was previously seen in clinic for symmetric intense hypermetabolic activity in thickened lingual tonsils on PET in 2021. At that point, a laryngoscopy showed symmetrically enlarged lingual tonsils but no concerning findings. A repeat PET in 2024 showed no activity in the lingual tonsils.     Today she presents with complaints of long standing nasal congestion. She endorses difficulty breathing through the nose on both sides. She has tried flonase daily but has seldom tried nasal saline irrigations. She denies using Afrin daily. She has a history of sleep apnea. She denies a history of trauma or surgery to the nose. She is unsure about undergoing a UPPP in the past but did have a tonsillectomy in 2001. She says a recent sleep study showed no significant sleep apnea.     The patient denies fevers, weight loss, SOB, pain, dysphagia, odynophagia, hoarseness, hemoptysis, otalgia, or neck masses/growths.      Past Medical History    Tonsillectomy as a teenager    Past Medical History:   Diagnosis Date    ESRD (end stage renal disease)     Focal segmental glomerulosclerosis 1998    Hypertension     Pulmonary hypertension     Scoliosis (and kyphoscoliosis), idiopathic          Past Surgical History    Past Surgical History:   Procedure Laterality Date    BACK SURGERY      CHG US GUIDE, VASCULAR ACCESS Right 07/11/2013    Procedure: ULTRASOUND GUIDANCE FOR VASC ACCESS REQUIRING [place] EVAL OF POTENTIAL ACCESS SITES;  Surgeon: Alben Deeds, MD;  Location: MAIN OR [facility];  Service: Vascular    PR AV ANAST,FOREARM VEIN TRANSPOSITION Right 07/11/2013    Procedure: R. UE BRACHIOCEPHALIC FISTULA;  Surgeon: Alben Deeds, MD;  Location: MAIN OR [place];  Service: Vascular    PR AV ANAST,UP ARM BASILIC VEIN TRANSPOSIT Right 07/11/2013    Procedure: BASILIC VEIN TRANSPOSITION;  Surgeon: Alben Deeds, MD;  Location: MAIN OR [facility];  Service: Vascular    PR RIGHT HEART CATH O2 SATURATION & CARDIAC OUTPUT N/A 06/16/2015    Procedure: Right Heart Catheterization;  Surgeon: Alvira Philips, MD;  Location: [facility];  Service: Cardiology    PR TRANSPLANTATION OF KIDNEY Right 01/11/2017    Procedure: RENAL ALLOTRANSPLANTATION, IMPLANTATION OF GRAFT; WITHOUT RECIPIENT NEPHRECTOMY;  Surgeon: Leona Carry, MD;  Location: MAIN OR [facility];  Service: Transplant         Medications      Current Outpatient Medications   Medication Sig Dispense Refill    acetaminophen (TYLENOL) 325 MG tablet Take 1-2 tablets (325-650 mg total) by mouth every four (4) hours as needed for pain. 100 tablet 0    amlodipine (NORVASC) 2.5 MG tablet Take 3 tablets (7.5 mg total) by mouth daily. 90 tablet 11    aspirin (ECOTRIN) 81 MG tablet TAKE 1 TABLET (81 MG) BY MOUTH DAILY 30 tablet 0    biotin 5 mg cap Take 1 capsule (5,000 mcg total) by mouth daily. 30 capsule 11    chlorthalidone (HYGROTON) 25 MG tablet Take 1 tablet (25 mg total) by mouth every morning. 90 tablet 3    cyclobenzaprine (FLEXERIL) 10 MG tablet Take 1 tablet (10 mg total) by mouth two (2) times a day as needed for muscle spasms. (Patient not taking: Reported on 04/25/2023) 45 tablet 0    cycloSPORINE modified (NEORAL) 25 MG capsule Take 5 capsules (125 mg total) by mouth two (2) times a day. 300 capsule 11    fluticasone propionate (FLONASE) 50 mcg/actuation nasal spray 1 spray by Each Nare route daily. 16 g 4    hydrOXYzine (ATARAX) 25 MG tablet Take 1 tablet (25 mg total) by mouth nightly as needed. (Patient not taking: Reported on 04/25/2023) 30 tablet 3    loratadine (CLARITIN) 10 mg tablet Take 10 mg by mouth daily as needed for allergies.      magnesium oxide (MAG-OX) 400 mg (241.3 mg elemental magnesium) tablet Take 1 tablet (400 mg total) by mouth daily. 30 tablet 11    melatonin 5 mg tablet Take 1 tablet (5 mg total) by mouth nightly as needed. 30 tablet 11    metoPROLOL tartrate (LOPRESSOR) 100 MG tablet TAKE ONE TABLET TWICE DAILY 180 tablet 3    potassium chloride 20 MEQ ER tablet Take 1 tablet (20 mEq total) by mouth two (2) times a day. 30 tablet 11    predniSONE (DELTASONE) 5 MG tablet Take 1 tablet (5 mg total) by mouth daily. 30 tablet 11    rOPINIRole (REQUIP) 0.25 MG tablet TAKE ONE TABLET BY MOUTH 1-3 HOURS BEFORE BEDTIME. (Patient not taking: Reported on 04/25/2023) 30 tablet 0    rosuvastatin (CRESTOR) 5 MG tablet Take 1 tablet (5 mg total) by mouth daily. 90 tablet 3    traZODone (DESYREL) 50 MG tablet Take 2 tablets (100 mg total) by mouth nightly. 60 tablet 11    WEGOVY 0.25 MG/0.5 ML SUBCUTANEOUS PEN INJECTOR Inject 0.25 mg under the skin every seven (7) days. (Patient not taking: Reported on 04/25/2023) 2 mL 11     No current facility-administered medications for this visit.         Allergies    Ibuprofen and Sulfa (sulfonamide antibiotics)      Social History:  Tobacco use:  reports that she has never smoked. She has never used smokeless tobacco.  Alcohol use:  reports no history of alcohol use.  Drug use:  reports no history of drug use.      Family History    The patient's family history includes Hypertension in her mother; No Known Problems in her daughter, father, maternal grandfather, maternal grandmother, paternal grandfather, paternal grandmother, and sister..      Review of Systems  A 10 system review of systems was negative except as noted in HPI and intake encounter form, which was reviewed and scanned into the media section of the medical record      Objective     Vital Signs  BP 122/67 (BP Site: L Arm, BP Position: Sitting)  - Pulse 74  - Temp 36.5 ??C (97.7 ??F) (Temporal)  - Resp 18  - Ht 149.9 cm (4' 11)  - Wt 96.5 kg (212 lb 11.2 oz)  - LMP 04/30/2016  - SpO2 97%  - BMI 42.96 kg/m??       Physical Exam    Vitals signs reviewed in the nursing chart  General:  WD, WN, well groomed, pleasant female, sitting up in NAD, normal appearance and voice, communicates appropriately, no stridor.  Psychiatric/Neuro:  A&Ox3, Normal mood and affect, Following commands, MAE. Cranial nerves 2-12 intact, No focal deficits.   Head and Face:  AT, [place], no asymmetry, Skin with no masses or lesions, sinuses nontender to palpation.  Parotid and submandibular glands normal bilaterally.  Facial strength normal bilaterally.   Eyes:  EOM Intact, sclera anicteric, no conjunctival injection, PERRL.   Nose:  Normal external nasal pyramid, anterior rhinoscopy shows enlarged IT bilaterally   Ears:  Normal auricles, EAC's, and TM's to otoscopy.  TM's intact and mobile, Middle ears aerated bilaterally.  Hearing:  Normal hearing to whispered voice.  Oral Cavity:   Normal lips, gingiva, tongue, hard palate, and dentition; pink, moist mucosa, no lesions, tongue is mobile.  Floor of mouth is soft.  Oropharynx:  Congestion of inferior turbinate R>L. Surgical absence of uvula and tonsils. No lesions. BOT soft, soft palate and posterior pharynx, symmetric pink, moist mucosa  Neck:  No masses, no thyroid nodules, no thyromegaly, trachea midline. Normal laryngeal crepitus.  Lymphatics:  No cervical lymphadenopathy.  Respiratory:  Normal respiratory effort, symmetric chest rise, no accessory muscle usage.  Cardiovascular:  No clubbing, cyanosis, swelling, edema or varicosities in extremities, Regular Rate.      Nasal endoscopy Procedure Note    Pre-operative Diagnosis:   1. Neck pain    2. Nasal obstruction      Post-operative Diagnosis: same    Anesthesia: none    Surgeon: Epimenio Sarin. [facility]iance [place]    Endoscopy Type:  Flexible Fiberoptic Nasal Endoscopy    Laryngoscope serial number [id number] was used     Indications: To better evaluate the patient???s nasal congestion    Procedure Details:    The patient was placed in the sitting position.  The 4 mm laryngoscope was passed.  The nasal cavities were examined. The following findings were noted:    Findings:  Nasal cavities:  Enlarged inferior turbinates R>L. Mild right septal deviation. Normal mucosa, patent, no masses, lesions. Congested nasal mucosa.  Nasopharynx:  Normal pink moist mucosa, normal eustachian tubes, no masses.    Condition:  Stable.  Patient tolerated procedure well.    Complications:  None    Test Results    none  Imaging    I have personally reviewed all pertinent imaging results.

## 2023-09-27 DIAGNOSIS — Z940 Kidney transplant status: Principal | ICD-10-CM

## 2023-09-27 MED ORDER — AMLODIPINE 2.5 MG TABLET
ORAL_TABLET | Freq: Every day | ORAL | 11 refills | 30 days | Status: CP
Start: 2023-09-27 — End: 2024-09-26

## 2023-09-27 NOTE — Unmapped (Signed)
 Parke Simmers: [phone]    For urgent nursing questions please call the triage nurse line at [phone] during business hours 8am - 5pm.  After hours, please call [phone] and ask for the ENT physician on call.  If you are concerned, please do not hesitate to seek medical care at your local emergency department.    SCHEDULING  For surgery scheduling please call Marsela at [phone].  For scheduling clinic appointments please call [phone].  For scheduling speech therapy appointments please call [phone].    ABOUT YOUR CARE  For Oncology related questions, please call [phone].  Suzy Bouchard, Nurse Navigator for Drs. Milinda Antis, and [place].  Melvyn Novas, Nurse Navigator for Drs. Cecille Po, and Head and Neck Fellow    PAPERWORK/FORMS  Please include Name and Date of Birth on all faxes. FAX: [phone], Attn: Nurse Navigator.  Please allow 7-10 Business Days to complete any FMLA or Disability paperwork. Please include specific dates regarding time out of work.  Forms can not be returned by e-mail. Please include specific instructions regarding where completed forms should be returned.     IMAGING APPOINTMENTS   For appointments or questions about radiology appointments please call [phone];  Option 1 for CT, MRI, and ultrasounds.   Option 2 for PET scans.  Your ordering provider will contact you to discuss results.    MyChart Messages  For your safety and best care, please do not use MyChart messages for reporting symptoms or urgent messages.   Symptoms should be reported by calling the triage line above.   MyChart messages are checked only during weekday business hours.    Please use MyChart for non-urgent prescription refills, non-urgent scheduling issues, or non-urgent general questions/concerns.   Non-urgent concerns communicated via MyChart will be addressed within 24 business hours.   Please remember, clinic is only open from 8am-4:30pm and is closed over the weekend as well as major holidays.   MyChart messages will not be seen or answered during the times clinic is closed.  Note that MyChart messages may be routed a central pool. A team member of your provider will get back to you as quickly as possible.     Bethann Humble, RN

## 2023-09-28 ENCOUNTER — Ambulatory Visit: Admit: 2023-09-28 | Discharge: 2023-09-29

## 2023-09-28 DIAGNOSIS — J3489 Other specified disorders of nose and nasal sinuses: Principal | ICD-10-CM

## 2023-09-28 DIAGNOSIS — M542 Cervicalgia: Principal | ICD-10-CM

## 2023-09-28 NOTE — Unmapped (Signed)
 Laryngoscope serial number [id number] was used during this visit on 09/28/23

## 2023-10-01 NOTE — Unmapped (Signed)
 [facility] Refill Coordination Note    Specialty Medication(s) to be Shipped:   Transplant: cyclosporine 25mg     Other medication(s) to be shipped: No additional medications requested for fill at this time     Gina Hart, DOB: [birth year]  Phone: [phone] (home)       All above HIPAA information was verified with patient.     Was a Nurse, learning disability used for this call? No    Completed refill call assessment today to schedule patient's medication shipment from the [facility]  [phone]).  All relevant notes have been reviewed.     Specialty medication(s) and dose(s) confirmed: Regimen is correct and unchanged.   Changes to medications: Gina Hart reports no changes at this time.  Changes to insurance: No  New side effects reported not previously addressed with a pharmacist or physician: None reported  Questions for the pharmacist: No    Confirmed patient received a Conservation officer, historic buildings and a Surveyor, mining with first shipment. The patient will receive a drug information handout for each medication shipped and additional FDA Medication Guides as required.       DISEASE/MEDICATION-SPECIFIC INFORMATION        N/A    SPECIALTY MEDICATION ADHERENCE     Medication Adherence    Patient reported X missed doses in the last month: 0  Specialty Medication: cycloSPORINE modified 25 MG capsule (NEORAL)  Patient is on additional specialty medications: No  Patient is on more than two specialty medications: No  Any gaps in refill history greater than 2 weeks in the last 3 months: no  Demonstrates understanding of importance of adherence: yes              Were doses missed due to medication being on hold? No    cycloSPORINE modified 25 mg: 2 1/2 days of medicine on hand     REFERRAL TO PHARMACIST     Referral to the pharmacist: Not needed      [facility]     Shipping address confirmed in Epic.     Cost and Payment: Patient has a $0 copay, payment information is not required.    Delivery Scheduled: Yes, Expected medication delivery date: 10/03/23.     Medication will be delivered via UPS to the prescription address in Epic WAM.    Stephen Ehrlich   [facility]  Specialty Technician

## 2023-10-02 DIAGNOSIS — Z940 Kidney transplant status: Principal | ICD-10-CM

## 2023-10-02 MED FILL — CYCLOSPORINE MODIFIED 25 MG CAPSULE: ORAL | 30 days supply | Qty: 300 | Fill #2

## 2023-10-08 DIAGNOSIS — Z940 Kidney transplant status: Principal | ICD-10-CM

## 2023-10-08 DIAGNOSIS — D849 Immunodeficiency, unspecified: Principal | ICD-10-CM

## 2023-10-08 DIAGNOSIS — R8289 Other abnormal findings on cytological and histological examination of urine: Principal | ICD-10-CM

## 2023-10-08 DIAGNOSIS — R7989 Other specified abnormal findings of blood chemistry: Principal | ICD-10-CM

## 2023-10-08 MED ORDER — LEVOFLOXACIN 750 MG TABLET
ORAL_TABLET | Freq: Every day | ORAL | 0 refills | 14.00 days | Status: CP
Start: 2023-10-08 — End: 2023-10-22

## 2023-10-08 NOTE — Unmapped (Addendum)
 Spoke with patient about positive urine culture.  Patient endorses some frequency with urination.  She denies and burning with urination, hematuria or fevers as well as any other concerning symptoms.  Per Dr Ferman Houston, patient to start Levaquin  750 mg daily x 14 days.  Patient made aware of possibility of Tendonitis with this med and to reach out to transplant team if she develops any symptoms of this.  Patient verbalized understanding.  Script sent to local pharmacy.

## 2023-10-10 ENCOUNTER — Encounter
Admit: 2023-10-10 | Discharge: 2023-10-11 | Payer: Medicare (Managed Care) | Attending: Nurse Practitioner | Primary: Nurse Practitioner

## 2023-10-10 DIAGNOSIS — Z940 Kidney transplant status: Principal | ICD-10-CM

## 2023-10-10 NOTE — Unmapped (Signed)
 It was good seeing you today.  Please send a MyChart message or call [phone], option 2 with any questions.    Here is a summary of our plan:  I am providing a referral for cognitive rehabilitation (speech therapy).  You will be contacted by scheduler to arrange the first appointment.  If you are not receive a call or message within 1 week, please contact the therapy location to schedule: [facility]    --    [phone]    --    [address] [place], [place], [address]    Selected educational materials:  Fatigue management requires finding a good balance between activity and rest in order to decrease the amount of energy demand on your body. A good way to begin is to focus on the 4 P's. PLAN: Create a schedule for your day and your week.  Focus on when you have more energy to complete tasks as you set up your schedule.  Gather the supplies you may need before you start a task.  Planning out your day/week will help to avoid doing too much.  Recruit family, friends, or pay for help to complete harder tasks.  PRIORITIZE: Decide what needs to be done today and what can be done later. PACE: Keep your activity to shorter durations.  Maintain a slow and steady pace.  Rest often and rest before you feel tired.  POSITION: Change your activity to make it easier. Too much bending and reaching can make you feel tired. Always maintain a nice upright posture while sitting and standing.     Brain fog is a common issue for people recovering from COVID-19. It includes mental fatigue that causes difficulty with thinking skills, memory, and attention.  Some strategies to help manage this symptom include: balancing activity with rest (take a 20-30 second rest break every 20 minutes), writing things down to help with memory (use notes, electronic calendars, and alarms), and supporting your focus (wear earplugs in noisy environments, reduce distractions, and say no when you are too tired).    New illness can be challenging in many ways.  People who have Long COVID may experience symptoms of depression, anxiety, or both. Mood and anxiety symptoms can be interconnected with physical symptoms. For example, fatigue can lead to decreased mood or anxiety can worsen dizziness. This does not mean your symptoms are ???all in your head??? - your symptoms are very real. It does mean that our bodies and minds are connected, and it???s important to take care of both physical health and mental health!  Talk with your medical provider if you feel like you are having worsening mood or anxiety.    For our full listing of Long COVID patient educational materials, visit our website: [email or web address]

## 2023-10-10 NOTE — Unmapped (Unsigned)
 [facility]  [email or web address]    ASSESSMENT:     Gina Hart is a 48 y.o. female who is seen for evaluation of post-COVID-19 recovery needs.      At this time, there are no definitive treatments for Post-COVID Conditions.  However, with time and appropriate symptom management approaches, most patients experience substantial improvements.    Recommendations are provided below, which can be implemented by the patient's primary care provider and/or existing care team as they find to be appropriate.    PLAN RECOMMENDATIONS:     Brain fog:  Many patients recovering from COVID report transient or lasting cognitive dysfunction. Reported deficits in attention, executive functioning, language, processing speed, and memory are commonly collectively referred to as brain fog.  Gina Hart was provided a handout with strategies to improve cognitive efficiency, including managing mental fatigue, writing things down, supporting their focus, and organizing the day.  I am providing an order for outpatient speech therapy for cognitive rehabilitation due to ongoing memory and attention issues.  If cognitive symptoms persist, I would recommend a neuropsychology evaluation.  This assessment and testing would provide valuable, objective information on impaired cognitive domains as well as recommendations for recovery.  Nutrition changes can support brain fog recovery.  Options which could be considered include increased omega-3 fatty acid intake and use of a daily B-complex vitamin.  Standardized mangiferin, a product of mango leaf extract, has been shown to improve attention [employer], Nutrients, 2020).   If the brain fog does not improve sufficiently with the recommendations listed above, the patient may benefit from prescribed medication to improve attention and focus.  Potential options to be considered by the patient's care team include low-dose naltrexone, activating antidepressants (bupropion, atomoxetine), alpha-2 agonists (guanfacine) or stimulants (methylphenidate, amphetamine-dextroamphetamine, modafinil).    Chronic Cough and Dyspnea:  Chronic, intermittent cough and dyspnea on exertion - patient reports unsure of when this started.   Multiple factors possibly contributing to dyspnea including but not limited to hx pulm HTN, post covid, physical deconditioning.  Prior work-up includes: chest x-ray.   Last documented PFT 2016 prior to transplant surgery. 'Spirometry normal. Improved since prior test(s) on 5/16. DLCO is severely reduced.'   2018 Echo: EF>55%. Dilated left atrium - mild. Elevated pulmonary artery systolic pressure - mild. Normal right ventricular systolic function. No significant valvular abnormalities.    At home treatment: albuterol nebulizer as needed.  I am providing a referral to pulmonology for further evaluation.    Hyposmia:  Patient reports decreased sense of smell smell which began during last COVID-19 infection - unsure of exact date possibly in 2022.   Continues on fluticasone  nasal spray daily. Past treatment: nasal saline rinse.   Evaluated by ENT Dr. Tresia Fruit about 2 weeks ago and noted to have a deviated septum. Referred to [facility] ENT for further evaluation of nasal obstruction (appt 10/15/23).  Discussed smell retraining.     Mental health:  Mental health concerns, such as anxiety, depression, and stress disorder, are often seen in people recovering from COVID.  Sometimes these symptoms can be interconnected with physical ones.    It is important to note that the patient's symptom constellation is not secondary to a mental health diagnosis.  However, optimal overall recovery is dependent on managing any mental health concerns in conjunction with physical and cognitive ones.  Online support and advocacy groups can be helpful to network with others dealing with similar Post-COVID Conditions.  These include: [employer] (https://www.survivorcorps.com), [employer] (GadgetFlash.co.za), and FaceBook Long COVID  Support Group (https://www.PromotionalLoans.co.za).   I recommend Mindfulness Based Stress Reduction (MSBR).  There are several options to incorporate this into daily practice.  These include online resources (https://hayes-crane.biz/.asp or www.palousemindfulness.com) and in-person classes in [place], [place] (CashmereExporter.is).     Healthy Lifestyle:  Gina Hart will benefit from a healthy lifestyle including stable daily routines of 7-9 hours of sleep per night, regular physical activity without over-exertion, healthy diet, avoidance of alcohol and tobacco, and regular engagement with family and friends.  The patient may benefit from symptom tracking to find associations with activities.  This can be done on paper (using a journal / logbook) or online (using an app such as makevisible.com).    Preventing Future Illness:  Re-infection with COVID-19 has resulted in symptom worsening in some patients with Long COVID.  Given ongoing viral transmission, it is important to take steps to reduce the risk of repeat infection.  The CDC COVID-19 Community Levels provide a measurement of local COVID-19 risk and recommendations to how to protect yourself and others: PodExchange.nl      Follow-up:   3 Months Video Visit    The patient should follow up on a regular basis with primary care provider.  The above recommendations will be communicated to the primary care provider who will coordinate the patient's further care.      SUBJECTIVE:     Chief complaint: Evaluation of post-COVID-19 recovery needs.    History of present Illness:  Gina Hart is a 48 y.o. female seen in consultation at the request of Aleene Hurry for evaluation of post-COVID-19 recovery needs. Patient reports history of COVID-19 infection 2-3 time - no hospitalization. Reports initially testing positive for COVID-19 in 2020. Second COVID infection per medical history 07/2020. Past medical history of HTN, Chronic fatigue, ESRD secondary to FSGS, s/p kidney transplant, Scoliosis with history of spinal fusion thoracic spine to L2-3. Since COVID-19 infection reports brain fog, decreased sense of smell, and chronic cough.      Main Concerns:     1) Brain Fog - Patient states unable to tell me exactly when brain fog started. Reports ongoing memory issues for awhile (years, per medical record dating back as far as 80). Referred to [facility] Neuropsychology in 10/2020 related to memory difficulty - patient unsure if she completed testing. At times reports mind goes blank, word finding difficulty, has forgotten to administer certain medications at work, has driven to the wrong [employer] location before. Patient reports driving safely, but at times has gotten lost while driving. Uses GPS as needed. Family and friends have noted memory difficulty.    2) Loss of Smell - Patient reports loss of smell during last COVID-19 infection - unsure of exact date possibly in 2022. Able to smell stronger scents such as bleach, Lysol, strong perfume, stool. Patient states, I get whiffs of bleach. Generally not able to smell most things day to day such as food. Uses fluticasone  nasal spray daily. Past treatment: nasal saline rinse.     Evaluated by ENT Dr. Tresia Fruit about 2 weeks ago and noted to have a deviated septum. Referred to [facility] ENT for further evaluation of nasal obstruction (appt 10/15/23). Denies history of facial trauma. Denies prior sinus surgery.     3) Chronic Cough - Patient reports ongoing, lingering cough - patient reports unsure of when this started. Experiences dyspnea on exertion frequently. Usually notices cough when feeling winded. Occasional wheezing. Denies chest tightness, postnasal drip, sore throat, dysphagia, indigestion. Denies history of asthma. Has discussed  cough with PCP and nephrology. Prior work-up includes: chest x-ray. Last documented PFT 2016 prior to transplant surgery. At home treatment: albuterol nebulizer as needed. Denies prior pulmonology.    Medications:  Ms. Elderkin has a current medication list which includes the following prescription(s): acetaminophen , amlodipine , aspirin , biotin , chlorthalidone , cyclosporine  modified, fluticasone  propionate, levofloxacin , loratadine, magnesium  oxide, melatonin, metoprolol  tartrate, potassium chloride , prednisone , and rosuvastatin .    Allergies:  She is allergic to ibuprofen and sulfa  (sulfonamide antibiotics).    Past Medical History:  She  has a past medical history of ESRD (end stage renal disease), Focal segmental glomerulosclerosis (1998), Hypertension, Pulmonary hypertension, and Scoliosis (and kyphoscoliosis), idiopathic.    Past Surgical History:  She  has a past surgical history that includes pr av anast,forearm vein transposition (Right, 07/11/2013); pr av anast,up arm basilic vein transposit (Right, 07/11/2013); chg us  guide, vascular access (Right, 07/11/2013); Back surgery; pr right heart cath o2 saturation & cardiac output (N/A, 06/16/2015); and pr transplantation of kidney (Right, 01/11/2017).    Family History:  Her family history includes Hypertension in her mother; No Known Problems in her daughter, father, maternal grandfather, maternal grandmother, paternal grandfather, paternal grandmother, and sister.    Review of systems:   All systems reviewed and negative other than noted elsewhere in this document.    Patient Completed Questionnaire:  Sar[facility] Opt Outs    09/28/2023  9:40 AM EDT - Cleavon Curls by Patient   Would you like to be excluded from the patient list? Yes   Would you like for us  to withhold information about you from your family and friends? No   Would you like for us  to withhold information about you from community clergy? Yes     [facility] Questionnaire 10/10/2023 10:21 AM EDT - Cleavon Curls by Patient   Your current and recent medical providers   Do you have a primary care provider? Yes   Name and phone: Arn Beth   Have you seen a pulmonologist (lung doctor)?  No   Have you seen a cardiologist (heart doctor)?  No   Have you seen a neurologist? No   Have you seen a psychiatrist or psychologist (counselor)? No   Have you recently seen other medical providers related to your ongoing COVID-19 symptoms? No   Information about your COVID-19 case   Did you have a confirmed COVID-19 infection? Yes   Approximate date: 05/16/2019   What were the main symptoms at the time of your initial COVID-19 illness? All symptoms   Were you hospitalized? No   Since COVID, are you experiencing:   Fatigue? Yes   Comments: Very low energy   Difficulties with your sleep? Yes   Comments: Barley sleeping   Weight changes? No   Shortness of breath? Yes   Comments: Out of breathe alot   Recurrent coughing? Yes   Comments: All the time   Chest pains? No   ???Brain fog (memory or attention problems)? Yes   Comments: Can???t think clear or remember   Frequent headaches? No   Weakness in your arms or legs? No   Numbness or tingling in your arms or legs? No   Loss or altered smell or taste? Yes   Comments: No smell since Covid   Ringing in the ears? No   Nausea? Yes   Comments: Sometimes   Vomiting? No   Constipation? Yes   Comments: Some   Diarrhea? Yes   Comments: Some   Joint or muscle pains? Yes  Comments: More than before Covid   Do you have other concerns you would like to address? Yes   Comments: Will discuss at appointment   Over the last two weeks, how often have you been bothered by the following problems?   Feeling nervous, anxious or on edge Several days   Not being able to stop or control worrying Several days   Worrying too much about different things Several days   Trouble relaxing  Nearly every day !    Being so restless that it is hard to sit still Several days   Becoming easily annoyed or irritable Several days   Feeling afraid as if something awful might happen Several days   Over the last two weeks, how often have you been bothered by the following problems?   Little interest or pleasure in doing things  Several days !    Feeling down, depressed, or hopeless  Several days !    Trouble falling or staying asleep, or sleeping too much  More than half the days !    Feeling tired or having little energy  More than half the days !    Poor appetite or overeating; Poor appetite, weight loss or overeating Several days   Feeling bad about yourself - or that you are a failure or have let yourself or your family down Not at all   Trouble concentrating on things, such as reading the newspaper or watching television Nearly every day   Moving or speaking so slowly that other people could have noticed. Or the opposite - being so fidgety or restless that you have been moving around a lot more than usual  Several days !    Have you had recent testing related to your ongoing COVID symptoms?   MRI or CT scans? No   Pulmonary function (breathing) testing? No   Blood work? No   Other tests? No   Please indicate the number (0-10) that best describes how much distress you have been experiencing in the past week including today.   Distress Level: 0-No Distress     [place] Video Visit Questionnaire    10/10/2023 10:22 AM EDT - Cleavon Curls by Patient   Virtual Care Guidelines Accept       Impact on Lifestyle  Effect on work?    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   Effect on physical activity tolerance (exercise ability)? Yes.   Need assistance for bADLs (dressing, toileting, bathing)? No.   Need assistance for iADLs (cooking, money mgmt, shopping)? Yes. Able to complete own grocery shopping. At times has forgotten to pay bill which son helps with.         Screening Tests:        GAD-7 Score:  GAD7 Total Score GAD-7 Total Score   10/10/2023  10:21 AM 9        Patient-reported Mini MoCA  Memory/Attention:    Read list of words, subject must repeat them.  2 trials even if first trial is successful     Coat      Notebook      Juice      Violin      Square    1:   [x]     2:   [x]     No points   Language Fluency:    Name a maximum number of words in one minute that begin with the letter T []  0-2 words:  0 points  []  3-5 words:  1 point  []  6-9 words:  2 points  []  10-13 words:  3 points  [x]  14 words or more:  4 points       4/4   Orientation   [x]  Date    [x]  Month     [x]  Year    [x]  Day     [x]  Location     [x]  City       6/6   Recall  (MUST RECALL WITH NO CLUES) []  Coat    [x]  Notebook     []  Juice     [x]  Violin   []  Square     2/5     Total Score: 12/15  (Normal >=11)       OBJECTIVE:     GEN: No acute distress  HEENT: Atraumatic, normocephalic   RESP: Non-dyspneic on room air  NEURO: Alert, fluent speech.  No facial droop.   PSYCH: Affect appropriate     Diagnostic Studies:     Recent results reviewed in [facility] medical record and Epic Care Everywhere      02/2021 Chest x-ray  Impression  Probable right lower lung scarring and increased left lower lung subsegmental atelectasis.     06/2019 Chest x-ray  Impression:  No acute airspace disease.       04/2015 Pulmonary Function Test  Spirometry is normal.   Improved since prior test(s) on 5/16   DLCO is severely reduced.       Lab Results   Component Value Date    WBC 5.3 10/12/2023    HGB 13.2 10/12/2023    HCT 39.1 10/12/2023    PLT 256 10/12/2023       Lab Results   Component Value Date    NA 136 10/12/2023    K 3.8 10/12/2023    CL 99 10/12/2023    CO2 27.5 10/12/2023    BUN 24 (H) 10/12/2023    CREATININE 1.19 (H) 10/12/2023    GLU 104 (H) 10/12/2023    CALCIUM 10.3 10/12/2023    MG 1.8 10/12/2023    PHOS 3.2 10/12/2023             The patient reports they are physically located in [place]  and is currently: at home. I conducted a audio/video visit. I spent  40m 35s on the video call with the patient. I spent an additional 8 minutes on pre- and post-visit activities on the date of service . or tingling in your arms or legs? No   Loss or altered smell or taste? Yes   Comments: No smell since Covid   Ringing in the ears? No   Nausea? Yes   Comments: Sometimes   Vomiting? No   Constipation? Yes   Comments: Some   Diarrhea? Yes   Comments: Some   Joint or muscle pains? Yes   Comments: More than before Covid   Do you have other concerns you would like to address? Yes   Comments: Will discuss at appointment   Over the last two weeks, how often have you been bothered by the following problems?   Feeling nervous, anxious or on edge Several days   Not being able to stop or control worrying Several days   Worrying too much about different things Several days   Trouble relaxing  Nearly every day !    Being so restless that it is hard to sit still Several days   Becoming easily annoyed or  irritable Several days   Feeling afraid as if something awful might happen Several days   Over the last two weeks, how often have you been bothered by the following problems?   Little interest or pleasure in doing things  Several days !    Feeling down, depressed, or hopeless  Several days !    Trouble falling or staying asleep, or sleeping too much  More than half the days !    Feeling tired or having little energy  More than half the days !    Poor appetite or overeating; Poor appetite, weight loss or overeating Several days   Feeling bad about yourself - or that you are a failure or have let yourself or your family down Not at all   Trouble concentrating on things, such as reading the newspaper or watching television Nearly every day   Moving or speaking so slowly that other people could have noticed. Or the opposite - being so fidgety or restless that you have been moving around a lot more than usual  Several days !    Have you had recent testing related to your ongoing COVID symptoms?   MRI or CT scans? No   Pulmonary function (breathing) testing? No   Blood work? No   Other tests? No   Please indicate the number (0-10) that best describes how much distress you have been experiencing in the past week including today.   Distress Level: 0-No Distress     [facility] Video Visit Questionnaire    10/10/2023 10:22 AM EDT - Cleavon Curls by Patient   Virtual Care Guidelines Accept               Impact on Lifestyle  Effect on work?    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   Effect on physical activity tolerance (exercise ability)? Yes.   Need assistance for bADLs (dressing, toileting, bathing)? No.   Need assistance for iADLs (cooking, money mgmt, shopping)? Yes. Able to complete own grocery shopping. At times has forgotten to pay bill which son helps with.         Screening Tests:        GAD-7 Score:  GAD7 Total Score GAD-7 Total Score   10/10/2023  10:21 AM 9        Patient-reported           Mini MoCA  Memory/Attention:    Read list of words, subject must repeat them.  2 trials even if first trial is successful     Coat      Notebook      Juice      Violin      Square    1:   [x]     2:   [x]     No points   Language Fluency:    Name a maximum number of words in one minute that begin with the letter T []  0-2 words:  0 points  []  3-5 words:  1 point  []  6-9 words:  2 points  []  10-13 words:  3 points  [x]  14 words or more:  4 points       4/4   Orientation   [x]  Date    [x]  Month     [x]  Year    [x]  Day     [x]  Location     [x]  City       6/6   Recall  (MUST  RECALL WITH NO CLUES) []  Coat    [x]  Notebook     []  Juice     [x]  Violin   []  Square     2/5     Total Score: 12/15  (Normal >=11)       OBJECTIVE:     GEN: No acute distress  HEENT: Atraumatic, normocephalic   RESP: Non-dyspneic on room air  SKIN: No focal lesions or ecchymosis appreciated   NEURO: Alert, fluent speech.  No facial droop. No focal weakness noted.  PSYCH: Affect appropriate     Diagnostic Studies:     Recent results reviewed in [facility] medical record and Epic Care Everywhere      02/2021 Chest x-ray  Impression  *Probable right lower lung scarring and increased left lower lung subsegmental atelectasis.     06/2019 Chest x-ray  Impression:  No acute airspace disease.       04/2015 Pulmonary Function Test  Spirometry is normal.   Improved since prior test(s) on 5/16   DLCO is severely reduced. Clinical correlation suggested.       Lab Results   Component Value Date    WBC 3.6 (L) 09/17/2023    HGB 13.3 09/17/2023    HCT 37.8 09/17/2023    PLT 259 09/17/2023       Lab Results   Component Value Date    NA 139 08/12/2023    K 3.4 (L) 08/12/2023    CL 101 08/12/2023    CO2 29.0 08/12/2023    BUN 25 (H) 08/12/2023    CREATININE 1.0 08/12/2023    GLU 100 (H) 08/12/2023    CALCIUM 10.0 08/12/2023    MG 1.8 08/12/2023    PHOS 3.1 08/12/2023           {    Coding tips - Do not edit this text, it will delete upon signing of note!    Telephone visits [phone] for Physicians and APPs and [phone] for Non- Physician Clinicians)- Only use minutes on the phone to determine level of service.    Video visits [phone]) - Use either level of medical decision making just as an in-person visit OR time which includes both minutes on video and pre/post minutes to determine the level of service.      :[id number]}  The patient reports they are physically located in [place]  and is currently: {patient location:[address]90}. I conducted a {phone audio video visit:101857} .

## 2023-10-12 ENCOUNTER — Ambulatory Visit: Admit: 2023-10-12 | Discharge: 2023-10-12 | Payer: MEDICARE

## 2023-10-12 ENCOUNTER — Ambulatory Visit: Admit: 2023-10-12 | Discharge: 2023-10-12 | Payer: MEDICARE | Attending: Nephrology | Primary: Nephrology

## 2023-10-12 DIAGNOSIS — R0609 Other forms of dyspnea: Principal | ICD-10-CM

## 2023-10-12 DIAGNOSIS — Z940 Kidney transplant status: Principal | ICD-10-CM

## 2023-10-12 DIAGNOSIS — N390 Urinary tract infection, site not specified: Principal | ICD-10-CM

## 2023-10-12 DIAGNOSIS — R82998 Other abnormal findings in urine: Principal | ICD-10-CM

## 2023-10-12 DIAGNOSIS — R7989 Other specified abnormal findings of blood chemistry: Principal | ICD-10-CM

## 2023-10-12 DIAGNOSIS — D849 Immunodeficiency, unspecified: Principal | ICD-10-CM

## 2023-10-12 LAB — CBC W/ AUTO DIFF
BASOPHILS ABSOLUTE COUNT: 0 10*9/L (ref 0.0–0.1)
BASOPHILS RELATIVE PERCENT: 0.4 %
EOSINOPHILS ABSOLUTE COUNT: 0.1 10*9/L (ref 0.0–0.5)
EOSINOPHILS RELATIVE PERCENT: 1.9 %
HEMATOCRIT: 39.1 % (ref 34.0–44.0)
HEMOGLOBIN: 13.2 g/dL (ref 11.3–14.9)
LYMPHOCYTES ABSOLUTE COUNT: 0.6 10*9/L — ABNORMAL LOW (ref 1.1–3.6)
LYMPHOCYTES RELATIVE PERCENT: 12.2 %
MEAN CORPUSCULAR HEMOGLOBIN CONC: 33.8 g/dL (ref 32.0–36.0)
MEAN CORPUSCULAR HEMOGLOBIN: 28.7 pg (ref 25.9–32.4)
MEAN CORPUSCULAR VOLUME: 85 fL (ref 77.6–95.7)
MEAN PLATELET VOLUME: 8.1 fL (ref 6.8–10.7)
MONOCYTES ABSOLUTE COUNT: 0.8 10*9/L (ref 0.3–0.8)
MONOCYTES RELATIVE PERCENT: 14.4 %
NEUTROPHILS ABSOLUTE COUNT: 3.8 10*9/L (ref 1.8–7.8)
NEUTROPHILS RELATIVE PERCENT: 71.1 %
PLATELET COUNT: 256 10*9/L (ref 150–450)
RED BLOOD CELL COUNT: 4.59 10*12/L (ref 3.95–5.13)
RED CELL DISTRIBUTION WIDTH: 13.5 % (ref 12.2–15.2)
WBC ADJUSTED: 5.3 10*9/L (ref 3.6–11.2)

## 2023-10-12 LAB — URINALYSIS WITH MICROSCOPY
BACTERIA: NONE SEEN /HPF
BILIRUBIN UA: NEGATIVE
BLOOD UA: NEGATIVE
GLUCOSE UA: NEGATIVE
KETONES UA: NEGATIVE
LEUKOCYTE ESTERASE UA: NEGATIVE
NITRITE UA: NEGATIVE
PH UA: 7 (ref 5.0–9.0)
PROTEIN UA: NEGATIVE
RBC UA: <1 /HPF (ref <=4)
SPECIFIC GRAVITY UA: 1.006 (ref 1.003–1.030)
SQUAMOUS EPITHELIAL: <1 /HPF (ref 0–5)
UROBILINOGEN UA: <2
WBC UA: <1 /HPF (ref 0–5)

## 2023-10-12 LAB — FERRITIN: FERRITIN: 138.3 ng/mL (ref 7.3–270.7)

## 2023-10-12 LAB — URINALYSIS WITH MICROSCOPY WITH CULTURE REFLEX PERFORMABLE
BACTERIA: NONE SEEN /HPF
BILIRUBIN UA: NEGATIVE
BLOOD UA: NEGATIVE
GLUCOSE UA: NEGATIVE
KETONES UA: NEGATIVE
LEUKOCYTE ESTERASE UA: NEGATIVE
NITRITE UA: NEGATIVE
PH UA: 7 (ref 5.0–9.0)
PROTEIN UA: NEGATIVE
RBC UA: <1 /HPF (ref <=4)
SPECIFIC GRAVITY UA: 1.006 (ref 1.003–1.030)
SQUAMOUS EPITHELIAL: <1 /HPF (ref 0–5)
UROBILINOGEN UA: <2
WBC UA: <1 /HPF (ref 0–5)

## 2023-10-12 LAB — LIPID PANEL
CHOLESTEROL/HDL RATIO SCREEN: 2.7 (ref 1.0–4.5)
CHOLESTEROL: 144 mg/dL (ref <=200)
HDL CHOLESTEROL: 53 mg/dL (ref 40–60)
LDL CHOLESTEROL CALCULATED: 65 mg/dL (ref 40–99)
NON-HDL CHOLESTEROL: 91 mg/dL (ref 70–130)
TRIGLYCERIDES: 132 mg/dL (ref 0–150)
VLDL CHOLESTEROL CAL: 26.4 mg/dL (ref 9–37)

## 2023-10-12 LAB — COMPREHENSIVE METABOLIC PANEL
ALBUMIN: 3.8 g/dL (ref 3.4–5.0)
ALKALINE PHOSPHATASE: 71 U/L (ref 46–116)
ALT (SGPT): 16 U/L (ref 10–49)
ANION GAP: 10 mmol/L (ref 5–14)
AST (SGOT): 21 U/L (ref <=34)
BILIRUBIN TOTAL: 0.6 mg/dL (ref 0.3–1.2)
BLOOD UREA NITROGEN: 24 mg/dL — ABNORMAL HIGH (ref 9–23)
BUN / CREAT RATIO: 20
CALCIUM: 10.3 mg/dL (ref 8.7–10.4)
CHLORIDE: 99 mmol/L (ref 98–107)
CO2: 27.5 mmol/L (ref 20.0–31.0)
CREATININE: 1.19 mg/dL — ABNORMAL HIGH (ref 0.55–1.02)
EGFR CKD-EPI (2021) FEMALE: 57 mL/min/1.73m2 — ABNORMAL LOW (ref >=60)
GLUCOSE RANDOM: 104 mg/dL — ABNORMAL HIGH (ref 70–99)
POTASSIUM: 3.8 mmol/L (ref 3.4–4.8)
PROTEIN TOTAL: 8.2 g/dL (ref 5.7–8.2)
SODIUM: 136 mmol/L (ref 135–145)

## 2023-10-12 LAB — IRON & TIBC
IRON SATURATION: 14 % — ABNORMAL LOW (ref 20–55)
IRON: 50 ug/dL (ref 50–170)
TOTAL IRON BINDING CAPACITY: 348 ug/dL (ref 250–425)

## 2023-10-12 LAB — HEMOGLOBIN A1C
ESTIMATED AVERAGE GLUCOSE: 117 mg/dL
HEMOGLOBIN A1C: 5.7 % — ABNORMAL HIGH (ref 4.8–5.6)

## 2023-10-12 LAB — MAGNESIUM: MAGNESIUM: 1.8 mg/dL (ref 1.6–2.6)

## 2023-10-12 LAB — PROTEIN / CREATININE RATIO, URINE
CREATININE, URINE: 27.8 mg/dL
PROTEIN URINE: 6.8 mg/dL
PROTEIN/CREAT RATIO, URINE: 0.245

## 2023-10-12 LAB — PHOSPHORUS: PHOSPHORUS: 3.2 mg/dL (ref 2.4–5.1)

## 2023-10-12 LAB — BILIRUBIN, DIRECT: BILIRUBIN DIRECT: 0.2 mg/dL (ref 0.00–0.30)

## 2023-10-12 NOTE — Unmapped (Signed)
 Transplant Coordinator, Clinic Visit   Pt seen today by transplant nephrology for follow up, reviewed medications and symptoms.   Met with pt during clinic visit today. PT doing well. Pt asked to please get labs today- she already took her cyclosporine  today. Pt with complaints of not being able to sleep at night. Pt complains of issues with memory. Also reports that she is going to follow with RNT    Assessment  BP: 119/69 today in clinic. PT does not check BP's at home  Lightheaded: denies  Headache: denies  Hand tremors: denies  Numbness/tingling: denies  Fevers: denies  Chills/sweats: denies  Shortness of breath: on exertion  Chest pain or pressure: denies  Palpitations: denies  Abdominal pain: denies  Heart burn: denies  Nausea/vomiting: denies  Diarrhea/constipation: denies  UTI symptoms: denies  Swelling: denies  Sleep: does not sleep well    Good appetite; reports adequate hydration.     Any new medications? denies  Immunosuppressant last taken: this am    Functional Score: 100   Normal no complaints; no evidence of  disease.     I spent a total of 10 minutes with Gina Hart reviewing medications and symptoms.

## 2023-10-12 NOTE — Unmapped (Addendum)
 Transplant Nephrology Clinic Visit       Assessment and Plan  Gina Hart is a 48 y.o. female who is s/p deceased donor kidney transplant on 01/11/17. She is seen today for follow up of her kidney transplant, immunosuppression and associated medical problems.     Status post deceased donor kidney transplant 01/11/17  - No history of rejection or evidence of FSGS recurrence.   - Serum creatinine is just above her baseline at 1.1 mg/dL (baseline <1.6 mg/dL).   - UP/C has been historically normal and is 0.245 today  - UA today is unremarkable.     - DSA screens have been negative (most recent 10/12/23)    Immunosuppression management.   - Cyclosporine level today is not a trough level. Last trough was 174 on 08/12/23 (target trough 125-225)  - Will continue cyclosporine 125 mg BID and prednisone 5 mg daily.   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   - PET CT 06/25/19 done at [facility] revealed symmetric hypermetabolic activity in the lingual tonsils without evidence of lymphadenopathy elsewhere. ENT evaluation on 07/09/19 revealed enlarged tonsils, enlarged turbinates, and congested nasal mucosa. Repeat PET CT at [facility] on 07/09/19 revealed only slightly increased uptake in the lingual tonsils without evidence of malignancy. PET CT 09/19/22 without evidence of PTLD. ENT follow up was most recently on 09/28/23.  - EBV viral load 899 on 08/03/22  - Will continue to follow periodic EBV viral loads.     Hypertension.    - Home BP 120-140 systolic. Clinic BP today 119/69  - Edema persists on amlodipine   - Will continue amlodipine, chlorthalidone 25 mg daily, metoprolol tartrate 100mg  bid     History of UTI   - Most recent UTI 09/17/23 with 3 organisma  - Symptoms of urinary frequency on 10/08/23 led to initiation of Levaquin on 10/08/23. She is tolerating this without tendinitis.   - Denies current UTI symptoms, UA not suggestive of UTI  - Given multiple organisms and possible history of pneumaturia will plan CT of abdomen and pelvis    History of COVID-19, onset 08/07/20, diagnosed 08/11/20, with long-COVID symptoms   - Had received 2 doses of COVID vaccine prior to illness  - Symptoms included fatigue, reduced appetite, loss of sense of smell, fever, cough, shortness of breath  - No O2 requirement  - Sotrovimab administered 08/12/20   - Though most symptoms resolved she has experienced insomnia, dyspnea on exertion, loss of smell, fatigue, brain fog, and occasional vertigo subsequetly  - Will be seen in COVID clinic    Back pain, neck pain, left leg pain with history of spinal fusion and cervical spinal stenosis   - She is s/p spinal fusion of thoracic spine through L2-3  - No longer on gabapentin due to side effects    Hyperlipidemia management  - Continue rosuvastatin 5 mg daily    Immunizations/Infection Prevention  Flu vaccine Fall 2022  Prevnar-20 08/04/21  COVID-19 vaccine booster recommended, patient declines    Obesity  - Patient was unable to fill [employer] due to cost/lack of insurance coverage  - BMI now 42.54 kg/m2    Memory Loss/Confusion.  - Brain MRI 07/06/18 and 06/15/20 was normal.   - Symptoms initially improved after switching to cyclosporine  from tacrolimus, but appear now to be persistent.  - Symptoms worsened after COVID infection  - Sleep study 12/17/19 revealed no sleep disordered breathing with mild to moderate periodic limb movement.  - Will continue melatonin for insomnia     Chronic cough, DOE, nasal congestion  - Has deviated septum  - Seen in ENT clinic recently, Flonase continued  - Will check chest CT scan  - COVID clinic follow up    Cholesterol Plaque on Ophthalmology Exam  - Patient reported this finding was made at an ophtho appointment.   - She may need carotid dopplers or other imaging of cerebrovascular system. This was not addressed at her Neuro appointment  - Continue aspirin and atorvastatin.     Iron Deficiency without anemia  - Will start iron supplement  - Needs colonoscopy    Follow up   6 months in-person visit if possible  CT non-contrast chest, abdomen, pelvis  COVID clinic 10/15/23   ENT clinic 11/05/23  Needs colonoscopy after CT scan results return    History of Present Illness    Gina Hart is a 48 y.o. female who is s/p deceased kidney transplant on 25-Jan-2017. She has experienced no episodes of rejection with normal urinary protein and baseline creatinine <1.0 mg/dL.     She presents today with complaints of fatigue, loss of appetite, loss of sense of smell, dyspnea on exertion, and cough. She denies chest pain, fever, or chills. Urine culture on 09/17/23 grew pseudomonas, E.coli and enterococcus all sensitive to levofloxacin that she started on 10/08/23. Dysuria has resolved. She feels she may have had pneumaturia in the past. She denies tendinitis.     She was seen by Dr. Tresia Fruit in ENT clinic on 09/28/23 for nasal septal deviation with chronic nasal congestion, thickened lingual tonsils, and was continued on Flonase and referred to rhinology.     Home BP has been 120-140 systolic. Short term memory problems are persistent.      Immunosuppressive medication doses are unchanged (cyclosporine 125 mg bid, prednisone 5 mg daily). She has held Myfortic due to the EBV viremia history.     Transplant History:  1. 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  2. S/P kidney transplant at [facility] 16-Jan-2107. Deceased donor with KDPI 19%. CMV D+/R-; EBV D+/R+  3. Immunosuppression = campath induction followed by tacrolimus and myfortic maintenance therapy, steroid free regimen.   4. Tacrolimus changed to cyclosporine 07/31/2017 due to neurotoxicity with memory deficits and headaches as primary manifestation.  5. RSV infection 09/10/18  6. EBV viremia 05/2020 (VL 9010) with increased tonsillar uptake on PET CT scans 06/24/20 and 07/11/19. No evidence of PTLD.   7. COVID-19 infection 07/2020    Past Medical History:  1. ESRD secondary to FSGS.   2. Hypertension  3. Scoliosis with history of spinal fusion thoracic spine to L2-3  4. COVID-19 07/2020  5. S/P kidney transplant as stated above    Review of Systems    All other systems are reviewed and are negative.    Medications  Current Outpatient Medications   Medication Sig Dispense Refill    acetaminophen (TYLENOL) 325 MG tablet Take 1-2 tablets (325-650 mg total) by mouth every four (4) hours as needed for pain. 100 tablet 0    amlodipine (NORVASC) 2.5 MG tablet Take 3 tablets (7.5 mg total) by mouth daily. 90 tablet 11    aspirin (ECOTRIN) 81 MG tablet Take 1 tablet (81 mg total) by mouth  daily.      biotin 5 mg cap Take 1 capsule (5,000 mcg total) by mouth daily. 30 capsule 11    chlorthalidone (HYGROTON) 25 MG tablet Take 1 tablet (25 mg total) by mouth every morning. 90 tablet 3    cycloSPORINE modified (NEORAL) 25 MG capsule Take 5 capsules (125 mg total) by mouth two (2) times a day. 300 capsule 11    levoFLOXacin (LEVAQUIN) 750 MG tablet Take 1 tablet (750 mg total) by mouth daily for 14 days. 14 tablet 0    loratadine (CLARITIN) 10 mg tablet Take 1 tablet (10 mg total) by mouth daily as needed for allergies.      magnesium oxide (MAG-OX) 400 mg (241.3 mg elemental magnesium) tablet Take 1 tablet (400 mg total) by mouth daily. 30 tablet 11    melatonin 5 mg tablet Take 1 tablet (5 mg total) by mouth nightly as needed. 30 tablet 11    metoPROLOL tartrate (LOPRESSOR) 100 MG tablet TAKE ONE TABLET TWICE DAILY 180 tablet 3    potassium chloride 20 MEQ ER tablet Take 1 tablet (20 mEq total) by mouth two (2) times a day. 30 tablet 11    predniSONE (DELTASONE) 5 MG tablet Take 1 tablet (5 mg total) by mouth daily. 30 tablet 11    rosuvastatin (CRESTOR) 5 MG tablet Take 1 tablet (5 mg total) by mouth daily. 90 tablet 3    fluticasone propionate (FLONASE) 50 mcg/actuation nasal spray 1 spray by Each Nare route daily. 16 g 4     No current facility-administered medications for this visit.       Physical Exam  BP 119/69 (BP Site: L Arm, BP Position: Sitting, BP Cuff Size: Large)  - Pulse 73  - Temp 36.1 ??C (96.9 ??F) (Temporal)  - Wt 95.5 kg (210 lb 9.6 oz)  - LMP 04/30/2016  - BMI 42.54 kg/m??   General: Patient is a pleasant female in no apparent distress.  Eyes: Sclera anicteric.  Neck: Supple without LAD/JVD/bruits.  Lungs: Clear to auscultation bilaterally, no wheezes/rales/rhonchi.  Cardiovascular: Regular rate and rhythm without murmurs, rubs or gallops.  Abdomen: Soft, notender/nondistended. Positive bowel sounds. No hepatosplenomegaly, masses or bruits appreciated.  Extremities: Without edema, joints without evidence of synovitis  Skin: Without rash  Neurological: Grossly nonfocal.  Psychiatric: Mood and affect appropriate.     Laboratory Results and Imaging Studies Reviewed in EMR

## 2023-10-13 LAB — CMV DNA, QUANTITATIVE, PCR: CMV VIRAL LD: NOT DETECTED

## 2023-10-13 LAB — CYCLOSPORINE, TROUGH: CYCLOSPORINE, TROUGH: 494 ng/mL — ABNORMAL HIGH (ref 100–400)

## 2023-10-13 LAB — BK VIRUS QUANTITATIVE PCR, BLOOD: BK BLOOD RESULT: NOT DETECTED

## 2023-10-17 DIAGNOSIS — Z940 Kidney transplant status: Principal | ICD-10-CM

## 2023-10-17 DIAGNOSIS — N390 Urinary tract infection, site not specified: Principal | ICD-10-CM

## 2023-10-17 LAB — VITAMIN D 25 HYDROXY: VITAMIN D, TOTAL (25OH): 14.9 ng/mL — ABNORMAL LOW (ref 20.0–80.0)

## 2023-10-17 MED ORDER — FOSFOMYCIN TROMETHAMINE 3 GRAM ORAL PACKET
ORAL | 0 refills | 9.00 days | Status: CP
Start: 2023-10-17 — End: 2023-10-24

## 2023-10-17 NOTE — Unmapped (Signed)
 Spoke with pt, she reports that she is having pain to elbow and ankle joints since being on Levaquin . Reviewed with Dr. Ferman Houston and pharmacy- order for fosfomycin 3 gm every 3 days for 3 doses.   Tried to call pt to notify her- no answer, VM left. MyChart message also sent with above information.

## 2023-10-22 DIAGNOSIS — Z940 Kidney transplant status: Principal | ICD-10-CM

## 2023-10-22 LAB — VITAMIN D 1,25 DIHYDROXY: VITAMIN D 1,25-DIHYDROXY: 51 pg/mL

## 2023-10-23 LAB — HLA DS POST TRANSPLANT
ANTI-DONOR DRW #1 MFI: 0 MFI
ANTI-DONOR DRW #2 MFI: 0 MFI
ANTI-DONOR HLA-A #1 MFI: 0 MFI
ANTI-DONOR HLA-A #2 MFI: 0 MFI
ANTI-DONOR HLA-B #1 MFI: 0 MFI
ANTI-DONOR HLA-B #2 MFI: 30 MFI
ANTI-DONOR HLA-C #1 MFI: 0 MFI
ANTI-DONOR HLA-C #2 MFI: 0 MFI
ANTI-DONOR HLA-DP AG #1 MFI: 0 MFI
ANTI-DONOR HLA-DQB #1 MFI: 0 MFI
ANTI-DONOR HLA-DQB #2 MFI: 0 MFI
ANTI-DONOR HLA-DR #1 MFI: 0 MFI
ANTI-DONOR HLA-DR #2 MFI: 0 MFI

## 2023-10-23 LAB — FSAB CLASS 1 ANTIBODY SPECIFICITY: HLA CLASS 1 ANTIBODY RESULT: POSITIVE

## 2023-10-23 LAB — FSAB CLASS 2 ANTIBODY SPECIFICITY: HLA CL2 AB RESULT: NEGATIVE

## 2023-10-24 DIAGNOSIS — Z940 Kidney transplant status: Principal | ICD-10-CM

## 2023-10-24 MED ORDER — CHOLECALCIFEROL (VITAMIN D3) 50 MCG (2,000 UNIT) CAPSULE
ORAL_CAPSULE | Freq: Every day | ORAL | 11 refills | 30.00000 days | Status: CP
Start: 2023-10-24 — End: 2024-10-23

## 2023-10-24 MED ORDER — FERROUS SULFATE 325 MG (65 MG IRON) TABLET
ORAL_TABLET | Freq: Two times a day (BID) | ORAL | 11 refills | 30.00000 days | Status: CP
Start: 2023-10-24 — End: 2024-10-23

## 2023-10-24 NOTE — Unmapped (Signed)
 Spoke with patient who would like to better understand HLA results with labs.  Detailed explanation of results and all patient's questions answered.  Let her know that these results were all good and showed that there is no note of rejection to the donor kidney.  Also, patient states that she tends to get constipated with oral iron.  She will let [facility] know if this occurs with new iron dose she is supposed to take.  She is finishing up abx for UTI and plans to repeat UA in a week or two.  She currently has no symptoms of UTI at this time.

## 2023-10-26 NOTE — Unmapped (Signed)
 [facility] Special[facility] and Home Delivery Pharm[facility] Refill Coordination Note    Specialty Medication(s) to be Shipped:   Transplant: cyclosporine 25mg     Other medication(s) to be shipped: No additional medications requested for fill at this time     Gina Hart, DOB: Sep 25, 1975  Phone: [phone] (home)       All above HIPAA information was verified with patient.     Was a Nurse, learning disability used for this call? No    Completed refill call assessment today to schedule patient's medication shipment from the [facility] and Home Delivery Pharmacy  [phone]).  All relevant notes have been reviewed.     Specialty medication(s) and dose(s) confirmed: Regimen is correct and unchanged.   Changes to medications: Komal reports no changes at this time.  Changes to insurance: No  New side effects reported not previously addressed with a pharmacist or physician: None reported  Questions for the pharmacist: No    Confirmed patient received a Conservation officer, historic buildings and a Surveyor, mining with first shipment. The patient will receive a drug information handout for each medication shipped and additional FDA Medication Guides as required.       DISEASE/MEDICATION-SPECIFIC INFORMATION        N/A    SPECIALTY MEDICATION ADHERENCE     Medication Adherence    Patient reported X missed doses in the last month: 0  Specialty Medication: cyclosporine 25mg   Patient is on additional specialty medications: No  Informant: patient              Were doses missed due to medication being on hold? No    cyclosporine 25 mg: 6 days of medicine on hand       REFERRAL TO PHARMACIST     Referral to the pharmacist: Not needed      [facility]     Shipping address confirmed in Epic.     Cost and Payment: Patient has a $0 copay, payment information is not required.    Delivery Scheduled: Yes, Expected medication delivery date: 10/30/23.     Medication will be delivered via UPS to the prescription address in Epic WAM.    Riesa Chary, PharmD   [facility] Delivery Pharm[facility]  Specialty Pharmacist

## 2023-10-29 DIAGNOSIS — N390 Urinary tract infection, site not specified: Principal | ICD-10-CM

## 2023-10-29 DIAGNOSIS — Z940 Kidney transplant status: Principal | ICD-10-CM

## 2023-10-29 MED ORDER — FOSFOMYCIN TROMETHAMINE 3 GRAM ORAL PACKET
ORAL | 0 refills | 9.00000 days | Status: CP
Start: 2023-10-29 — End: 2023-11-05

## 2023-10-29 MED FILL — CYCLOSPORINE MODIFIED 25 MG CAPSULE: ORAL | 30 days supply | Qty: 300 | Fill #3

## 2023-10-29 NOTE — Unmapped (Signed)
 Returned patient's call.  She notes frequency and urgency at this time and would like to see if an antibiotic could be sent in.  Patient was started on Levaquin for UTI symptoms and switched to Fosfomycin x 3 doses last week and has completed her abx.  She is unable to leave a sample until Friday due to work commitment.  Per Dr Ferman Houston, patient to resume Fosfamycin for 3 doses every 3 days and will proceed with CT abdomen and pelvis 5/10.

## 2023-11-02 DIAGNOSIS — Z940 Kidney transplant status: Principal | ICD-10-CM

## 2023-11-03 ENCOUNTER — Inpatient Hospital Stay: Admit: 2023-11-03 | Discharge: 2023-11-04 | Payer: Medicare (Managed Care)

## 2023-11-05 ENCOUNTER — Ambulatory Visit
Admit: 2023-11-05 | Discharge: 2023-11-06 | Payer: Medicare (Managed Care) | Attending: Student in an Organized Health Care Education/Training Program | Primary: Student in an Organized Health Care Education/Training Program

## 2023-11-05 DIAGNOSIS — J3489 Other specified disorders of nose and nasal sinuses: Principal | ICD-10-CM

## 2023-11-05 DIAGNOSIS — J329 Chronic sinusitis, unspecified: Principal | ICD-10-CM

## 2023-11-05 MED ORDER — BUDESONIDE 0.5 MG/2 ML SUSPENSION FOR NEBULIZATION
Freq: Two times a day (BID) | 3 refills | 30.00000 days | Status: CP
Start: 2023-11-05 — End: ?

## 2023-11-05 MED ORDER — ESOMEPRAZOLE MAGNESIUM 20 MG CAPSULE,DELAYED RELEASE
ORAL_CAPSULE | Freq: Every day | ORAL | 2 refills | 30.00000 days | Status: CP
Start: 2023-11-05 — End: 2024-11-04

## 2023-11-08 DIAGNOSIS — Z940 Kidney transplant status: Principal | ICD-10-CM

## 2023-11-08 NOTE — Unmapped (Signed)
 Returned patient's call.  She states she saw ENT Monday and lab order was placed for allergy panel.  She would like these labs moved to [facility].  Advised that it doesn't appear this particular panel can be drawn through [facility] and that she will need to come to [facility] or reach out to ENT to make other plans.  She will come Friday to [facility] for panel.

## 2023-11-09 ENCOUNTER — Ambulatory Visit: Admit: 2023-11-09 | Discharge: 2023-11-10 | Payer: Medicare (Managed Care)

## 2023-11-09 LAB — CBC W/ AUTO DIFF
BASOPHILS ABSOLUTE COUNT: 0 10*9/L (ref 0.0–0.1)
BASOPHILS RELATIVE PERCENT: 0.7 %
EOSINOPHILS ABSOLUTE COUNT: 0.1 10*9/L (ref 0.0–0.5)
EOSINOPHILS RELATIVE PERCENT: 2.8 %
HEMATOCRIT: 38.6 % (ref 34.0–44.0)
HEMOGLOBIN: 13.3 g/dL (ref 11.3–14.9)
LYMPHOCYTES ABSOLUTE COUNT: 0.8 10*9/L — ABNORMAL LOW (ref 1.1–3.6)
LYMPHOCYTES RELATIVE PERCENT: 22.4 %
MEAN CORPUSCULAR HEMOGLOBIN CONC: 34.6 g/dL (ref 32.0–36.0)
MEAN CORPUSCULAR HEMOGLOBIN: 29 pg (ref 25.9–32.4)
MEAN CORPUSCULAR VOLUME: 83.8 fL (ref 77.6–95.7)
MEAN PLATELET VOLUME: 8 fL (ref 6.8–10.7)
MONOCYTES ABSOLUTE COUNT: 0.8 10*9/L (ref 0.3–0.8)
MONOCYTES RELATIVE PERCENT: 21.2 %
NEUTROPHILS ABSOLUTE COUNT: 2 10*9/L (ref 1.8–7.8)
NEUTROPHILS RELATIVE PERCENT: 52.9 %
NUCLEATED RED BLOOD CELLS: 0 /100{WBCs} (ref <=4)
PLATELET COUNT: 241 10*9/L (ref 150–450)
RED BLOOD CELL COUNT: 4.61 10*12/L (ref 3.95–5.13)
RED CELL DISTRIBUTION WIDTH: 13.6 % (ref 12.2–15.2)
WBC ADJUSTED: 3.8 10*9/L (ref 3.6–11.2)

## 2023-11-09 LAB — BASIC METABOLIC PANEL
ANION GAP: 15 mmol/L — ABNORMAL HIGH (ref 5–14)
BLOOD UREA NITROGEN: 21 mg/dL (ref 9–23)
BUN / CREAT RATIO: 23
CALCIUM: 10.3 mg/dL (ref 8.7–10.4)
CHLORIDE: 100 mmol/L (ref 98–107)
CO2: 26.6 mmol/L (ref 20.0–31.0)
CREATININE: 0.91 mg/dL (ref 0.55–1.02)
EGFR CKD-EPI (2021) FEMALE: 77 mL/min/1.73m2 (ref >=60)
GLUCOSE RANDOM: 102 mg/dL (ref 70–179)
POTASSIUM: 3.5 mmol/L (ref 3.4–4.8)
SODIUM: 142 mmol/L (ref 135–145)

## 2023-11-09 LAB — URINALYSIS WITH MICROSCOPY WITH CULTURE REFLEX PERFORMABLE
BACTERIA: NONE SEEN /HPF
BILIRUBIN UA: NEGATIVE
BLOOD UA: NEGATIVE
GLUCOSE UA: NEGATIVE
KETONES UA: NEGATIVE
LEUKOCYTE ESTERASE UA: NEGATIVE
NITRITE UA: NEGATIVE
PH UA: 6.5 (ref 5.0–9.0)
PROTEIN UA: NEGATIVE
RBC UA: 1 /HPF (ref <=4)
SPECIFIC GRAVITY UA: 1.012 (ref 1.003–1.030)
SQUAMOUS EPITHELIAL: <1 /HPF (ref 0–5)
UROBILINOGEN UA: <2
WBC UA: 1 /HPF (ref 0–5)

## 2023-11-09 LAB — CYCLOSPORINE LEVEL: CYCLOSPORINE (FPIA) BLOOD: 236 ng/mL

## 2023-11-09 LAB — PHOSPHORUS: PHOSPHORUS: 3.3 mg/dL (ref 2.4–5.1)

## 2023-11-09 LAB — PROTEIN / CREATININE RATIO, URINE
CREATININE, URINE: 76.3 mg/dL
PROTEIN URINE: 20.9 mg/dL
PROTEIN/CREAT RATIO, URINE: 0.274

## 2023-11-09 LAB — SLIDE REVIEW

## 2023-11-09 LAB — MAGNESIUM: MAGNESIUM: 1.8 mg/dL (ref 1.6–2.6)

## 2023-11-15 NOTE — Unmapped (Signed)
 [facility] Special[facility] and Home Delivery Pharmacy Refill Coordination Note    Specialty Medication(s) to be Shipped:   Transplant: cyclosporine 25mg     Other medication(s) to be shipped: No additional medications requested for fill at this time     Gina Hart, DOB: Aug 12, 1975  Phone: [phone] (home)       All above HIPAA information was verified with patient.     Was a Nurse, learning disability used for this call? No    Completed refill call assessment today to schedule patient's medication shipment from the [facility] and Home Delivery Pharmacy  [phone]).  All relevant notes have been reviewed.     Specialty medication(s) and dose(s) confirmed: Regimen is correct and unchanged.   Changes to medications: [name] reports no changes at this time.  Changes to insurance: No  New side effects reported not previously addressed with a pharmacist or physician: None reported  Questions for the pharmacist: No    Confirmed patient received a Conservation officer, historic buildings and a Surveyor, mining with first shipment. The patient will receive a drug information handout for each medication shipped and additional FDA Medication Guides as required.       DISEASE/MEDICATION-SPECIFIC INFORMATION        N/A    SPECIALTY MEDICATION ADHERENCE     Medication Adherence    Patient reported X missed doses in the last month: 0  Specialty Medication: Cyclosporine 25mg   Patient is on additional specialty medications: No              Were doses missed due to medication being on hold? No    Cyclosporine 25 mg: 13 days of medicine on hand     REFERRAL TO PHARMACIST     Referral to the pharmacist: Not needed      [facility]     Shipping address confirmed in Epic.     Cost and Payment: Patient has a $0 copay, payment information is not required.    Delivery Scheduled: Yes, Expected medication delivery date: 11/22/23.     Medication will be delivered via UPS to the prescription address in Epic WAM.    [name], [facility]   [facility] Home Delivery Pharm[facility] Specialty Pharmacist

## 2023-11-20 LAB — ENT ENVIRONMENTAL PANEL
ALTERNARIA ALTERNATA IGE: <0.35 kU/L (ref <0.35)
ASPERGILLUS FUMIGATUS IGE: <0.35 kU/L (ref <0.35)
ASPERGILLUS NIGER IGE: <0.35 kU/L (ref <0.35)
BAHIA GRASS IGE: <0.35 kU/L (ref <0.35)
BERMUDA GRASS IGE: <0.35 kU/L (ref <0.35)
BIRCH (COMMON SILVER) TREE IGE: <0.35 kU/L (ref <0.35)
BOX ELDER TREE IGE: <0.35 kU/L (ref <0.35)
CANDIDA ALBICANS IGE: <0.35 kU/L (ref <0.35)
CAT DANDER IGE: <0.35 kU/L (ref <0.35)
CLADOSPORIUM HERBARUM IGE: <0.35 kU/L (ref <0.35)
COCKLEBUR IGE: <0.35 kU/L (ref <0.35)
COTTON LINTERS IGE: <0.35 kU/L (ref <0.35)
COTTONWOOD (WHITE POPLAR) TREE IGE: <0.35 kU/L (ref <0.35)
CURVULARIA LUNATA IGE: <0.35 kU/L (ref <0.35)
D. FARINAE IGE: <0.35 kU/L (ref <0.35)
D. PTERONYSSINUS IGE: <0.35 kU/L (ref <0.35)
DOG DANDER IGE: <0.35 kU/L (ref <0.35)
ELM TREE IGE: <0.35 kU/L (ref <0.35)
ENGLISH PLANTAIN IGE: <0.35 kU/L (ref <0.35)
EPICOCCUM PURPURASCENS IGE: <0.35 kU/L (ref <0.35)
FUSARIUM PROLIFERATUM IGE: <0.35 kU/L (ref <0.35)
GERMAN COCKROACH IGE: <0.35 kU/L (ref <0.35)
GIANT RAGWEED IGE: <0.35 kU/L (ref <0.35)
GOOSEFOOT (LAMB'S QUARTERS) IGE: <0.35 kU/L (ref <0.35)
HORSE DANDER IGE: <0.35 kU/L (ref <0.35)
JOHNSON GRASS IGE: <0.35 kU/L (ref <0.35)
KENTUCKY BLUE (MEADOW) GRASS IGE: <0.35 kU/L (ref <0.35)
MAPLE LEAF SYCAMORE IGE: <0.35 kU/L (ref <0.35)
MEADOW FESCUE IGE: <0.35 kU/L (ref <0.35)
MOUNTAIN JUNIPER IGE: <0.35 kU/L (ref <0.35)
MUGWORT IGE: <0.35 kU/L (ref <0.35)
MULBERRY, WHITE IGE: <0.35 kU/L (ref <0.35)
ORCHARD GRASS IGE: <0.35 kU/L (ref <0.35)
P CHRYSOGENUM (P NOTATUM) IGE: <0.35 kU/L (ref <0.35)
PECAN (HICKORY) IGE: <0.35 kU/L (ref <0.35)
PIGWEED, COMMON IGE: <0.35 kU/L (ref <0.35)
RAGWEED, SHORT (COMMON) IGE: <0.35 kU/L (ref <0.35)
REDTOP (BENTGRASS) IGE: <0.35 kU/L (ref <0.35)
RHIZOPUS NIGRICAN IGE: <0.35 kU/L (ref <0.35)
SETOMELANOMMA ROSTRATA (H. HALODES) IGE: <0.35 kU/L (ref <0.35)
SHEEP SORREL IGE: <0.35 kU/L (ref <0.35)
SWEET VERNAL IGE: <0.35 kU/L (ref <0.35)
TIMOTHY GRASS IGE: <0.35 kU/L (ref <0.35)
TRICHOPHYTON RUBRUM IGE: <0.35 kU/L (ref <0.35)
WALNUT TREE IGE: <0.35 kU/L (ref <0.35)
WHITE ASH TREE IGE: <0.35 kU/L (ref <0.35)
WHITE OAK TREE IGE: <0.35 kU/L (ref <0.35)
WHITE PINE TREE IGE: <0.35 kU/L (ref <0.35)

## 2023-11-21 MED FILL — CYCLOSPORINE MODIFIED 25 MG CAPSULE: ORAL | 30 days supply | Qty: 300 | Fill #4

## 2023-12-02 MED ORDER — POTASSIUM CHLORIDE ER 20 MEQ TABLET,EXTENDED RELEASE(PART/CRYST)
ORAL_TABLET | Freq: Two times a day (BID) | ORAL | 11 refills | 15.00000 days | Status: CP
Start: 2023-12-02 — End: ?

## 2023-12-03 DIAGNOSIS — Z940 Kidney transplant status: Principal | ICD-10-CM

## 2023-12-05 LAB — PROTEIN / CREATININE RATIO, URINE
CREATININE, URINE: 54.9
PROTEIN URINE: 15.2
PROTEIN/CREAT RATIO, URINE: 277 mg/g — ABNORMAL HIGH (ref 0–200)

## 2023-12-06 DIAGNOSIS — Z940 Kidney transplant status: Principal | ICD-10-CM

## 2023-12-07 DIAGNOSIS — Z940 Kidney transplant status: Principal | ICD-10-CM

## 2023-12-07 NOTE — Unmapped (Signed)
 Called patient regarding my chart message about UPCR.  Advised patient that this is normal, that [facility] interpretation of lab is actually .277, not 277.  Explained that this is within normal limits.  Patient verbalized understanding.

## 2023-12-17 NOTE — Unmapped (Signed)
 [facility] Specialty and Home Delivery Pharmacy Refill Coordination Note    Specialty Medication(s) to be Shipped:   Transplant: cyclosporine  25mg     Other medication(s) to be shipped: No additional medications requested for fill at this time     Gina Hart, DOB: [birth year]  Phone: [phone] (home)       All above HIPAA information was verified with patient.     Was a Nurse, learning disability used for this call? No    Completed refill call assessment today to schedule patient's medication shipment from the [facility] and Home Delivery Pharmacy  [phone]).  All relevant notes have been reviewed.     Specialty medication(s) and dose(s) confirmed: Regimen is correct and unchanged.   Changes to medications: Gina Hart reports no changes at this time.  Changes to insurance: No  New side effects reported not previously addressed with a pharmacist or physician: None reported  Questions for the pharmacist: No    Confirmed patient received a Conservation officer, historic buildings and a Surveyor, mining with first shipment. The patient will receive a drug information handout for each medication shipped and additional FDA Medication Guides as required.       DISEASE/MEDICATION-SPECIFIC INFORMATION        N/A    SPECIALTY MEDICATION ADHERENCE     Medication Adherence    Patient reported X missed doses in the last month: 0  Specialty Medication: cycloSPORINE  modified 25 MG capsule (NEORAL )  Patient is on additional specialty medications: No              Were doses missed due to medication being on hold? No      cycloSPORINE  modified 25 MG capsule (NEORAL ): 6 days of medicine on hand       REFERRAL TO PHARMACIST     Referral to the pharmacist: Not needed      [facility]     Shipping address confirmed in Epic.     Cost and Payment: Patient has a $0 copay, payment information is not required.    Delivery Scheduled: Yes, Expected medication delivery date: 6.26.25.     Medication will be delivered via UPS to the prescription address in Epic WAM.    Doyal Hurst [facility] Home Delivery Pharm[facility]  Specialty Technician

## 2023-12-19 MED FILL — CYCLOSPORINE MODIFIED 25 MG CAPSULE: ORAL | 30 days supply | Qty: 300 | Fill #5

## 2023-12-20 DIAGNOSIS — Z940 Kidney transplant status: Principal | ICD-10-CM

## 2023-12-20 MED ORDER — PREDNISONE 5 MG TABLET
ORAL_TABLET | Freq: Every day | ORAL | 11 refills | 0.00000 days
Start: 2023-12-20 — End: ?

## 2023-12-20 MED ORDER — POTASSIUM CHLORIDE ER 20 MEQ TABLET,EXTENDED RELEASE(PART/CRYST)
ORAL_TABLET | Freq: Two times a day (BID) | ORAL | 11 refills | 15.00000 days | Status: CP
Start: 2023-12-20 — End: ?

## 2023-12-20 NOTE — Unmapped (Signed)
 Attempted to return on call page.  Phone went straight to voicemail.  Unable to leave message as voicemail box is full.    Patient paged again.  Returned page, patient requesting potassium prescription be sent to local pharmacy.  Requested patient empty her voicemail box in order to be able to receive message.

## 2023-12-21 MED ORDER — PREDNISONE 5 MG TABLET
ORAL_TABLET | Freq: Every day | ORAL | 11 refills | 30.00000 days | Status: CP
Start: 2023-12-21 — End: ?

## 2023-12-31 DIAGNOSIS — Z940 Kidney transplant status: Principal | ICD-10-CM

## 2023-12-31 MED ORDER — AMLODIPINE 2.5 MG TABLET
ORAL_TABLET | Freq: Every day | ORAL | 11 refills | 30.00000 days | Status: CP
Start: 2023-12-31 — End: 2024-12-30

## 2024-01-01 NOTE — Progress Notes (Signed)
 [facility] Note     Referring provider: Barbie [name] Bradley,*    Subjective   Vicki Mercer is a 48 y.o. female presenting with a complaint of Nasal congestion. She is seen in consultation at the request of Dr. Barbie for evaluation and opinion of nasal congestion.      She was previously seen in our ENT [facility] clinic for symmetric intense hypermetabolic activity in thickened lingual tonsils on PET in 2021 with laryngoscopy findings of enlarged lingual tonsils but no mucosal lesions. A repeat PET in 2024 showed no activity in the lingual tonsils.      She notes long standing nasal congestion. She endorses difficulty breathing through the nose on both sides. She has tried flonase  daily but not regular use of nasal saline irrigations. No frequent oxymetazoline use. She has a history of sleep apnea. She denies a history of trauma or surgery to the nose. She is unsure about undergoing a UPPP in the past but did have a tonsillectomy in 2001. She says a recent sleep study showed no significant sleep apnea.      The patient denies fevers, weight loss, SOB, pain, dysphagia, odynophagia, hoarseness, hemoptysis, otalgia, or neck masses/growths.    Denies significant changes in sense of smell or colored nasal discharge. No facial pain or pressure.       Independent review of imaging:  PET CT 2024: No significant mucosal thickening within the paranasal sinuses or nasal cavities.        Review of Systems:   14 systems were reviewed. Pertinent positive and negative findings are noted in the History of Present Illness and Past Medical History. There are no additional complaints.      Current Medications[1]     Past Medical History[2]      Family History[3]       reports that she has never smoked. She has never used smokeless tobacco. She reports that she does not drink alcohol and does not use drugs.      Physical Exam:      Vital Signs:  Blood pressure 130/85, pulse 71, temperature 36.3 ??C (97.3 ??F), temperature source Temporal, height 149.9 cm (4' 11), weight 95.2 kg (209 lb 12.8 oz), last menstrual period 04/30/2016, not currently breastfeeding. Body mass index is 42.37 kg/m??.     General: Patient is sitting comfortably on the exam chair, no signs of acute distress. Alert and oriented.  Head and face: Normocephalic, facial tone symmetric  Eyes: extraocular muscles are intact, pupils equally reactive to light and accomodation..  Neck: no palpable masses, supple, symmetric, normal range of motion.  Lymphatic: No pathologically enlarged cervical nodes.    Ears:   Right side: No periauricular skin lesions, auricle of normal shape and size.   Left side: No periauricular skin lesions, auricle of normal shape and size.      Nose: No cutaneous lesions or gross deformity of the external nose, anterior rhinoscopy demonstrates a midline septum.   Right side: inferior turbinate enlarged and edematous.   Left size: inferior turbinate enlarged and edematous.    Oral Cavity: Lips clear, teeth clear, gums clear, oral tongue clear, floor of mouth clear, hard and soft palate are clear, oropharynx clear.     Neurological exam: cranial nerves II through XII grossly intact.    Psychological: Patients shows appropriate mood and affect.           Assessment/Plan:   Vicki Mercer is a 48 y.o. female with with history of renal transplant  in 2018, who presented to this clinic in 2021 for symmetric intense hypermetabolic activity in thickened lingual tonsils on PET with reassuring laryngoscopy findings and negative PET scan in 2024, now presenting for nasal congestion in the setting of bilateral IT hypertrophy and mild right septal deviation. Her obstruction seems mostly driven by inflammation in the absence of significant anatomic deformity.    .     Plan:  Assessment & Plan      Plan:  - Trial budesonide  nasal irrigations for more diffuse administration fo topical nasal corticosteroids.   - Esomeprazole  for pharyngeal symptoms secondary to additional inflammation due to some LPR  - Allergy testing ordered today - will contact patient if any obvious allergens identified to consider allergy clinic referral.   - Otherwise she may followup PRN        Problem List Items Addressed This Visit    None  Visit Diagnoses         Chronic sinusitis, unspecified location    -  Primary    Relevant Medications    albuterol HFA 90 mcg/actuation inhaler    albuterol 2.5 mg /3 mL (0.083 %) nebulizer solution    budesonide  (PULMICORT ) 0.5 mg/2 mL nebulizer solution      Nasal obstruction        Relevant Medications    albuterol HFA 90 mcg/actuation inhaler    albuterol 2.5 mg /3 mL (0.083 %) nebulizer solution    budesonide  (PULMICORT ) 0.5 mg/2 mL nebulizer solution    Other Relevant Orders    ENT Environmental Panel (Completed)             Orders Placed This Encounter   Procedures    ENT Environmental Panel     Standing Status:   Future     Number of Occurrences:   1     Expected Date:   11/05/2023     Expiration Date:   11/04/2024     Release to patient:   Immediate [1]            [1]   Current Outpatient Medications   Medication Sig Dispense Refill    albuterol HFA 90 mcg/actuation inhaler Inhale 2 puffs continuous as needed.      acetaminophen  (TYLENOL ) 325 MG tablet Take 1-2 tablets (325-650 mg total) by mouth every four (4) hours as needed for pain. 100 tablet 0    albuterol 2.5 mg /3 mL (0.083 %) nebulizer solution Inhale 3 mL (2.5 mg total) by nebulization continuous as needed.      amlodipine  (NORVASC ) 2.5 MG tablet Take 3 tablets (7.5 mg total) by mouth daily. 90 tablet 11    aspirin  (ECOTRIN) 81 MG tablet Take 1 tablet (81 mg total) by mouth daily.      biotin  5 mg cap Take 1 capsule (5,000 mcg total) by mouth daily. 30 capsule 11    budesonide  (PULMICORT ) 0.5 mg/2 mL nebulizer solution 2 mL (0.5 mg total) by Other route two (2) times a day. Add one respule (0.5mg /95mL) to your morning sinus rinse bottle and shake bottle, then irrigate each nostril. Do the same with an additional respule in the evening. 120 mL 3    chlorthalidone  (HYGROTON ) 25 MG tablet Take 1 tablet (25 mg total) by mouth every morning. 90 tablet 3    cholecalciferol , vitamin D3-50 mcg, 2,000 unit,, 50 mcg (2,000 unit) cap Take 1 capsule (50 mcg total) by mouth in the morning. 30 capsule 11    cycloSPORINE  (SANDIMMUNE ) 100 MG  capsule Take 1 capsule (100 mg total) by mouth two (2) times a day as needed.      cycloSPORINE  modified (NEORAL ) 25 MG capsule Take 5 capsules (125 mg total) by mouth two (2) times a day. 300 capsule 11    esomeprazole  (NEXIUM ) 20 MG capsule Take 2 capsules (40 mg total) by mouth daily. For two months, take 40mg  (two tablets) at night. Then decrease dose to 20mg  (one tablet) for one month. 60 capsule 2    ferrous sulfate  325 (65 FE) MG tablet Take 1 tablet (325 mg total) by mouth two (2) times a day. 60 tablet 11    fluticasone  propionate (FLONASE ) 50 mcg/actuation nasal spray 1 spray by Each Nare route daily. 16 g 4    loratadine (CLARITIN) 10 mg tablet Take 1 tablet (10 mg total) by mouth daily as needed for allergies.      magnesium  oxide (MAG-OX) 400 mg (241.3 mg elemental magnesium ) tablet Take 1 tablet (400 mg total) by mouth daily. 30 tablet 11    melatonin 5 mg tablet Take 1 tablet (5 mg total) by mouth nightly as needed. 30 tablet 11    metoPROLOL  tartrate (LOPRESSOR ) 100 MG tablet TAKE ONE TABLET TWICE DAILY 180 tablet 3    polyethylene glycol (GLYCOLAX ) 17 gram/dose powder Take 17 g by mouth continuous as needed.      potassium chloride  20 MEQ ER tablet Take 1 tablet (20 mEq total) by mouth two (2) times a day. 30 tablet 11    predniSONE  (DELTASONE ) 5 MG tablet TAKE ONE TABLET BY MOUTH ONCE DAILY 30 tablet 11    rosuvastatin  (CRESTOR ) 5 MG tablet Take 1 tablet (5 mg total) by mouth daily. 90 tablet 3     No current facility-administered medications for this visit.   [2]   Past Medical History:  Diagnosis Date    ESRD (end stage renal disease)        Focal segmental glomerulosclerosis 1998    Hypertension     Pulmonary hypertension        Scoliosis (and kyphoscoliosis), idiopathic    [3]   Family History  Problem Relation Age of Onset    Hypertension Mother     No Known Problems Father     No Known Problems Sister     No Known Problems Daughter     No Known Problems Maternal Grandmother     No Known Problems Maternal Grandfather     No Known Problems Paternal Grandmother     No Known Problems Paternal Grandfather     BRCA 1/2 Neg Hx     Breast cancer Neg Hx     Cancer Neg Hx     Colon cancer Neg Hx     Endometrial cancer Neg Hx     Ovarian cancer Neg Hx     Melanoma Neg Hx     Basal cell carcinoma Neg Hx     Squamous cell carcinoma Neg Hx

## 2024-01-01 NOTE — Unmapped (Signed)
 [facility] Note     Referring provider: Barbie [name] Bradley,*    Subjective   Gina Hart is a 48 y.o. female presenting with a complaint of Nasal congestion. She is seen in consultation at the request of Dr. Barbie for evaluation and opinion of nasal congestion.      She was previously seen in our ENT [facility] clinic for symmetric intense hypermetabolic activity in thickened lingual tonsils on PET in 2021 with laryngoscopy findings of enlarged lingual tonsils but no mucosal lesions. A repeat PET in 2024 showed no activity in the lingual tonsils.      She notes long standing nasal congestion. She endorses difficulty breathing through the nose on both sides. She has tried flonase  daily but not regular use of nasal saline irrigations. No frequent oxymetazoline use. She has a history of sleep apnea. She denies a history of trauma or surgery to the nose. She is unsure about undergoing a UPPP in the past but did have a tonsillectomy in 2001. She says a recent sleep study showed no significant sleep apnea.      The patient denies fevers, weight loss, SOB, pain, dysphagia, odynophagia, hoarseness, hemoptysis, otalgia, or neck masses/growths.    Denies significant changes in sense of smell or colored nasal discharge. No facial pain or pressure.       Independent review of imaging:  PET CT 2024: No significant mucosal thickening within the paranasal sinuses or nasal cavities.        Review of Systems:   14 systems were reviewed. Pertinent positive and negative findings are noted in the History of Present Illness and Past Medical History. There are no additional complaints.      Current Medications[1]     Past Medical History[2]      Family History[3]       reports that she has never smoked. She has never used smokeless tobacco. She reports that she does not drink alcohol and does not use drugs.      Physical Exam:      Vital Signs:  Blood pressure 130/85, pulse 71, temperature 36.3 ??C (97.3 ??F), temperature source Temporal, height 149.9 cm (4' 11), weight 95.2 kg (209 lb 12.8 oz), last menstrual period 04/30/2016, not currently breastfeeding. Body mass index is 42.37 kg/m??.     General: Patient is sitting comfortably on the exam chair, no signs of acute distress. Alert and oriented.  Head and face: Normocephalic, facial tone symmetric  Eyes: extraocular muscles are intact, pupils equally reactive to light and accomodation..  Neck: no palpable masses, supple, symmetric, normal range of motion.  Lymphatic: No pathologically enlarged cervical nodes.    Ears:   Right side: No periauricular skin lesions, auricle of normal shape and size.   Left side: No periauricular skin lesions, auricle of normal shape and size.      Nose: No cutaneous lesions or gross deformity of the external nose, anterior rhinoscopy demonstrates a midline septum.   Right side: inferior turbinate enlarged and edematous.   Left size: inferior turbinate enlarged and edematous.    Oral Cavity: Lips clear, teeth clear, gums clear, oral tongue clear, floor of mouth clear, hard and soft palate are clear, oropharynx clear.     Neurological exam: cranial nerves II through XII grossly intact.    Psychological: Patients shows appropriate mood and affect.           Assessment/Plan:   Gina Hart is a 48 y.o. female with with history of renal transplant  in 2018, who presented to this clinic in 2021 for symmetric intense hypermetabolic activity in thickened lingual tonsils on PET with reassuring laryngoscopy findings and negative PET scan in 2024, now presenting for nasal congestion in the setting of bilateral IT hypertrophy and mild right septal deviation. Her obstruction seems mostly driven by inflammation in the absence of significant anatomic deformity.    .     Plan:  Assessment & Plan      Plan:  - Trial budesonide  nasal irrigations for more diffuse administration fo topical nasal corticosteroids.   - Esomeprazole  for pharyngeal symptoms secondary to additional inflammation due to some LPR  - Allergy testing ordered today - will contact patient if any obvious allergens identified to consider allergy clinic referral.   - Otherwise she may followup PRN        Problem List Items Addressed This Visit    None  Visit Diagnoses         Chronic sinusitis, unspecified location    -  Primary    Relevant Medications    albuterol HFA 90 mcg/actuation inhaler    albuterol 2.5 mg /3 mL (0.083 %) nebulizer solution    budesonide  (PULMICORT ) 0.5 mg/2 mL nebulizer solution      Nasal obstruction        Relevant Medications    albuterol HFA 90 mcg/actuation inhaler    albuterol 2.5 mg /3 mL (0.083 %) nebulizer solution    budesonide  (PULMICORT ) 0.5 mg/2 mL nebulizer solution    Other Relevant Orders    ENT Environmental Panel (Completed)             Orders Placed This Encounter   Procedures    ENT Environmental Panel     Standing Status:   Future     Number of Occurrences:   1     Expected Date:   11/05/2023     Expiration Date:   11/04/2024     Release to patient:   Immediate [1]            [1]   Current Outpatient Medications   Medication Sig Dispense Refill    albuterol HFA 90 mcg/actuation inhaler Inhale 2 puffs continuous as needed.      acetaminophen  (TYLENOL ) 325 MG tablet Take 1-2 tablets (325-650 mg total) by mouth every four (4) hours as needed for pain. 100 tablet 0    albuterol 2.5 mg /3 mL (0.083 %) nebulizer solution Inhale 3 mL (2.5 mg total) by nebulization continuous as needed.      amlodipine  (NORVASC ) 2.5 MG tablet Take 3 tablets (7.5 mg total) by mouth daily. 90 tablet 11    aspirin  (ECOTRIN) 81 MG tablet Take 1 tablet (81 mg total) by mouth daily.      biotin  5 mg cap Take 1 capsule (5,000 mcg total) by mouth daily. 30 capsule 11    budesonide  (PULMICORT ) 0.5 mg/2 mL nebulizer solution 2 mL (0.5 mg total) by Other route two (2) times a day. Add one respule (0.5mg /95mL) to your morning sinus rinse bottle and shake bottle, then irrigate each nostril. Do the same with an additional respule in the evening. 120 mL 3    chlorthalidone  (HYGROTON ) 25 MG tablet Take 1 tablet (25 mg total) by mouth every morning. 90 tablet 3    cholecalciferol , vitamin D3-50 mcg, 2,000 unit,, 50 mcg (2,000 unit) cap Take 1 capsule (50 mcg total) by mouth in the morning. 30 capsule 11    cycloSPORINE  (SANDIMMUNE ) 100 MG  capsule Take 1 capsule (100 mg total) by mouth two (2) times a day as needed.      cycloSPORINE  modified (NEORAL ) 25 MG capsule Take 5 capsules (125 mg total) by mouth two (2) times a day. 300 capsule 11    esomeprazole  (NEXIUM ) 20 MG capsule Take 2 capsules (40 mg total) by mouth daily. For two months, take 40mg  (two tablets) at night. Then decrease dose to 20mg  (one tablet) for one month. 60 capsule 2    ferrous sulfate  325 (65 FE) MG tablet Take 1 tablet (325 mg total) by mouth two (2) times a day. 60 tablet 11    fluticasone  propionate (FLONASE ) 50 mcg/actuation nasal spray 1 spray by Each Nare route daily. 16 g 4    loratadine (CLARITIN) 10 mg tablet Take 1 tablet (10 mg total) by mouth daily as needed for allergies.      magnesium  oxide (MAG-OX) 400 mg (241.3 mg elemental magnesium ) tablet Take 1 tablet (400 mg total) by mouth daily. 30 tablet 11    melatonin 5 mg tablet Take 1 tablet (5 mg total) by mouth nightly as needed. 30 tablet 11    metoPROLOL  tartrate (LOPRESSOR ) 100 MG tablet TAKE ONE TABLET TWICE DAILY 180 tablet 3    polyethylene glycol (GLYCOLAX ) 17 gram/dose powder Take 17 g by mouth continuous as needed.      potassium chloride  20 MEQ ER tablet Take 1 tablet (20 mEq total) by mouth two (2) times a day. 30 tablet 11    predniSONE  (DELTASONE ) 5 MG tablet TAKE ONE TABLET BY MOUTH ONCE DAILY 30 tablet 11    rosuvastatin  (CRESTOR ) 5 MG tablet Take 1 tablet (5 mg total) by mouth daily. 90 tablet 3     No current facility-administered medications for this visit.   [2]   Past Medical History:  Diagnosis Date    ESRD (end stage renal disease)        Focal segmental glomerulosclerosis 1998    Hypertension     Pulmonary hypertension        Scoliosis (and kyphoscoliosis), idiopathic    [3]   Family History  Problem Relation Age of Onset    Hypertension Mother     No Known Problems Father     No Known Problems Sister     No Known Problems Daughter     No Known Problems Maternal Grandmother     No Known Problems Maternal Grandfather     No Known Problems Paternal Grandmother     No Known Problems Paternal Grandfather     BRCA 1/2 Neg Hx     Breast cancer Neg Hx     Cancer Neg Hx     Colon cancer Neg Hx     Endometrial cancer Neg Hx     Ovarian cancer Neg Hx     Melanoma Neg Hx     Basal cell carcinoma Neg Hx     Squamous cell carcinoma Neg Hx

## 2024-01-14 DIAGNOSIS — Z940 Kidney transplant status: Principal | ICD-10-CM

## 2024-01-14 LAB — BASIC METABOLIC PANEL
ANION GAP: 10.3 (ref 2.0–15.0)
CALCIUM: 9.7 (ref 8.5–10.1)
CHLORIDE: 101 (ref 100–108)
CO2: 27.7 mmol/L (ref 21–32)
EGFR CKD-EPI AA FEMALE: 62
MAGNESIUM: 1.6 — ABNORMAL LOW (ref 1.8–2.4)
OSMOLALITY CALCULATION: 290
PHOSPHORUS: 2.5 — ABNORMAL LOW (ref 2.6–4.7)

## 2024-01-14 LAB — RENAL FUNCTION PANEL
BLOOD UREA NITROGEN: 18
BUN / CREAT RATIO: 16.4 — ABNORMAL HIGH
CREATININE: 1.1
GLUCOSE RANDOM: 115 — ABNORMAL HIGH
POTASSIUM: 3 — ABNORMAL LOW
SODIUM: 139

## 2024-01-14 LAB — PROTEIN / CREATININE RATIO, URINE
CREATININE, URINE: 82.2
PROTEIN URINE: 20
PROTEIN/CREAT RATIO, URINE: 243 mg/g — ABNORMAL HIGH (ref 0–200)

## 2024-01-14 MED ORDER — POTASSIUM CHLORIDE ER 20 MEQ TABLET,EXTENDED RELEASE(PART/CRYST)
ORAL_TABLET | Freq: Two times a day (BID) | ORAL | 11 refills | 30.00000 days | Status: CP
Start: 2024-01-14 — End: 2025-01-13

## 2024-01-14 NOTE — Unmapped (Signed)
 Received message from patient that she would like cholesterol panel sent to [facility] for testing today.  Order faxed per patient's request.      Also received message from lab about a critical K of 3.0.  Per Dr Macie patient to increase Potassium to 2 tablets BID.  Called patient and left detailed VM about this dose increase and need to repeat labs in a week.  Requested call back to confirm patient has received call.

## 2024-01-14 NOTE — Unmapped (Signed)
 [facility] Refill Coordination Note    Specialty Medication(s) to be Shipped:   Transplant: cyclosporine  25mg     Other medication(s) to be shipped: No additional medications requested for fill at this time     Gina Hart, DOB: May 02, 1976  Phone: [phone] (home)       All above HIPAA information was verified with patient.     Was a Nurse, learning disability used for this call? No    Completed refill call assessment today to schedule patient's medication shipment from the [facility]  [phone]).  All relevant notes have been reviewed.     Specialty medication(s) and dose(s) confirmed: Regimen is correct and unchanged.   Changes to medications: Gina Hart reports no changes at this time.  Changes to insurance: No  New side effects reported not previously addressed with a pharmacist or physician: None reported  Questions for the pharmacist: No    Confirmed patient received a Conservation officer, historic buildings and a Surveyor, mining with first shipment. The patient will receive a drug information handout for each medication shipped and additional FDA Medication Guides as required.       DISEASE/MEDICATION-SPECIFIC INFORMATION        N/A    SPECIALTY MEDICATION ADHERENCE     Medication Adherence    Patient reported X missed doses in the last month: 0  Specialty Medication: cycloSPORINE  modified 25 MG capsule (NEORAL )  Patient is on additional specialty medications: No  Patient is on more than two specialty medications: No  Any gaps in refill history greater than 2 weeks in the last 3 months: no  Demonstrates understanding of importance of adherence: yes              Were doses missed due to medication being on hold? No    cycloSPORINE  modified 25   mg: 9 days of medicine on hand       REFERRAL TO PHARMACIST     Referral to the pharmacist: Not needed      [facility]     Shipping address confirmed in Epic.     Cost and Payment: Patient has a $0 copay, payment information is not required.    Delivery Scheduled: Yes, Expected medication delivery date: 01/17/24.     Medication will be delivered via UPS to the prescription address in Epic WAM.    Gina Hart   [facility]  Specialty Technician

## 2024-01-15 DIAGNOSIS — Z940 Kidney transplant status: Principal | ICD-10-CM

## 2024-01-15 MED ORDER — FOSFOMYCIN TROMETHAMINE 3 GRAM ORAL PACKET
ORAL | 0 refills | 0.00000 days | Status: CP
Start: 2024-01-15 — End: ?

## 2024-01-15 NOTE — Unmapped (Signed)
 Patient paged on call. Called back, no answer and left VM asking she call back or call her primary TNC if she needs assistance.

## 2024-01-15 NOTE — Unmapped (Signed)
 Called patient regarding UTI symptoms that she reached out to on call coordinator about.  Per Dr Macie, patient to take Fosfomycin 3 g x 3 doses and await culture results.    No answer.  Left detailed VM about this and that script was sent to [facility].  Culture results pending.

## 2024-01-15 NOTE — Unmapped (Signed)
 Patient reports UTI symptoms, pain and frequency. States she left a urine sample at the lab yesterday.    Let her know I will talk to Dr Macie and call her back. She verbalized understanding

## 2024-01-16 DIAGNOSIS — Z940 Kidney transplant status: Principal | ICD-10-CM

## 2024-01-16 DIAGNOSIS — R0602 Shortness of breath: Principal | ICD-10-CM

## 2024-01-16 MED FILL — CYCLOSPORINE MODIFIED 25 MG CAPSULE: ORAL | 30 days supply | Qty: 300 | Fill #6

## 2024-01-17 LAB — CYCLOSPORINE, TROUGH: CYCLOSPORINE, TROUGH: 280

## 2024-01-21 ENCOUNTER — Inpatient Hospital Stay: Admit: 2024-01-21 | Discharge: 2024-01-21 | Payer: Medicare (Managed Care)

## 2024-01-21 ENCOUNTER — Ambulatory Visit
Admit: 2024-01-21 | Discharge: 2024-01-21 | Payer: Medicare (Managed Care) | Attending: Student in an Organized Health Care Education/Training Program | Primary: Student in an Organized Health Care Education/Training Program

## 2024-01-21 DIAGNOSIS — Z940 Kidney transplant status: Principal | ICD-10-CM

## 2024-01-21 MED ORDER — CYCLOSPORINE MODIFIED 25 MG CAPSULE
ORAL_CAPSULE | Freq: Two times a day (BID) | ORAL | 11 refills | 30.00000 days | Status: CP
Start: 2024-01-21 — End: 2025-01-20
  Filled 2024-02-11: qty 720, 90d supply, fill #0

## 2024-01-21 NOTE — Unmapped (Signed)
 Called patient regarding Cyclosporine  level of 280.  Per Dr Freya, patient to reduce to 100 mg BID of Cyclosporine  for a goal of 100-150.  Left detailed VM about this change.  My Chart message

## 2024-01-21 NOTE — Progress Notes (Unsigned)
 Visit canceled - note opened in error. updated in the patient's electronic medical record. Pertinent items are noted above.           PHYSICAL EXAM:      Physical Exam:There were no vitals filed for this visit.  There is no height or weight on file to calculate BMI.    General: WDWN/Obese/Cachectic, Non-toxic. NAD.   Eyes: PERRL. Anicteric sclera.   ENT:  Nasal mucosa clear. No polyps seen. No crusting.  Lymph: No cervical or supraclavicular lymphadenopathy.  Respiratory: *** WOB***  Cardiovascular: RRR, +s1/s2, Normal P2, no RV heave, No MRG, No edema  Abdomen: NABS, Soft, Non-tender, non-distended.  Musculoskeletal/extremities: Normal muscle tone and strength. No cyanosis. No clubbing.   Skin: No skin rashes on clothed exam  Neuro: Alert and oriented x 3. CN grossly intact. No focal neurological deficits  Psych: mood ***, affect congruent         LABORATORY and RADIOLOGY DATA:     Pulmonary Function Tests/Interpretation:        Pulmonary Function Test Results  Date FVC FEV1 FEV1/FVC TLC DLCO Other   05/14/15 2.17 (85) 1.85 (87) 85  11.4 (52) Not adjusted for hgb              :  - 05/04/15 - normal aside from inappropriately low HR increase with exertion    Pertinent Laboratory Data:  ***       Pertinent Imaging Data:  Images were personally reviewed with attending.     CT chest 11/03/2023 - scoliosis s/o spinal fusions, otherwise wnl  Echo 7/12/208 -  mildly elevated PASP    PSG 2021 reportedly normal aside from periodic limb movements

## 2024-01-24 DIAGNOSIS — Z940 Kidney transplant status: Principal | ICD-10-CM

## 2024-01-25 DIAGNOSIS — Z940 Kidney transplant status: Principal | ICD-10-CM

## 2024-01-28 DIAGNOSIS — Z940 Kidney transplant status: Principal | ICD-10-CM

## 2024-01-28 NOTE — Unmapped (Addendum)
 [facility] Pharmacist has reviewed a new prescription for cyclosporine  that indicates a dose decrease.  Patient was counseled on this dosage change by AS- see epic note from 7/28.  Next refill call date adjusted if necessary.      Clinical Assessment Needed For: Dose Change  Medication: Cyclosporine  25 mg   Last Fill Date/Day Supply: 01/16/2024 / 30  Refill Too Soon until 02/10/2024  Was previous dose already scheduled to fill: No    Notes to Pharmacist: Will retest

## 2024-01-31 DIAGNOSIS — I10 Essential (primary) hypertension: Principal | ICD-10-CM

## 2024-01-31 DIAGNOSIS — Z940 Kidney transplant status: Principal | ICD-10-CM

## 2024-01-31 MED ORDER — METOPROLOL TARTRATE 100 MG TABLET
ORAL_TABLET | Freq: Two times a day (BID) | ORAL | 3 refills | 90.00000 days | Status: CP
Start: 2024-01-31 — End: ?

## 2024-02-01 DIAGNOSIS — Z940 Kidney transplant status: Principal | ICD-10-CM

## 2024-02-01 MED ORDER — WEGOVY 0.25 MG/0.5 ML SUBCUTANEOUS PEN INJECTOR
SUBCUTANEOUS | 11 refills | 0.00000 days | Status: CP
Start: 2024-02-01 — End: 2025-01-31

## 2024-02-04 DIAGNOSIS — R0602 Shortness of breath: Principal | ICD-10-CM

## 2024-02-04 DIAGNOSIS — Z940 Kidney transplant status: Principal | ICD-10-CM

## 2024-02-04 NOTE — Unmapped (Signed)
 Spoke with patient about her not being able to receive any calls from this TNC. Pattient paged on call TNC from daughter's number. Advised that calls are going straight to VM and mailbox full.  She will look into this with her phone service provider.  Also advised to delete some messages.  She is going to Urgent Care for Vertigo symptoms.  She will keep TNC updated as able.

## 2024-02-04 NOTE — Unmapped (Signed)
 Patient was supposed to be seen for new evaluation in pulmonary clinic on 7/28, however, she was unable to keep the visit and only had PFTs done. She has a new patient visit now scheduled in September. Her PFTs show possible restriction - I have ordered lung volumes to further characterize prior to the visit with Dr. Edwina next month.

## 2024-02-05 NOTE — Unmapped (Addendum)
 8/18: received notification from [facility] in triage that patient was able to get 90ds cyclosporine . As this is only specialty medication patient receives from [facility], next refill call date adjusted accordingly -ef    [facility] Refill Coordination Note    Specialty Medication(s) to be Shipped:   Transplant: cyclosporine  25mg     Other medication(s) to be shipped: No additional medications requested for fill at this time    Specialty Medications not needed at this time: N/A     Gina Hart, DOB: Nov 09, 1975  Phone: [phone] (home)       All above HIPAA information was verified with patient.     Was a Nurse, learning disability used for this call? No    Completed refill call assessment today to schedule patient's medication shipment from the [facility]  [phone]).  All relevant notes have been reviewed.     Specialty medication(s) and dose(s) confirmed: Regimen is correct and unchanged.   Changes to medications: Cami reports no changes at this time.  Changes to insurance: No  New side effects reported not previously addressed with a pharmacist or physician: None reported  Questions for the pharmacist: No    Confirmed patient received a Conservation officer, historic buildings and a Surveyor, mining with first shipment. The patient will receive a drug information handout for each medication shipped and additional FDA Medication Guides as required.       DISEASE/MEDICATION-SPECIFIC INFORMATION        N/A    SPECIALTY MEDICATION ADHERENCE     Medication Adherence    Patient reported X missed doses in the last month: 0  Specialty Medication: cycloSPORINE  modified 25 MG capsule (NEORAL )  Patient is on additional specialty medications: No              Were doses missed due to medication being on hold? No      cycloSPORINE  modified 25 MG capsule (NEORAL ): 7-10 days of medicine on hand       REFERRAL TO PHARMACIST     Referral to the pharmacist: Not needed      [facility]     Shipping address confirmed in Epic.     Cost and Payment: Patient has a $0 copay, payment information is not required.    Delivery Scheduled: Yes, Expected medication delivery date: 02/12/2024.     Medication will be delivered via UPS to the prescription address in Epic WAM.    [facility]  Specialty Technician

## 2024-02-08 DIAGNOSIS — Z940 Kidney transplant status: Principal | ICD-10-CM

## 2024-02-11 DIAGNOSIS — Z940 Kidney transplant status: Principal | ICD-10-CM

## 2024-02-12 NOTE — Unmapped (Signed)
Therapy Update Follow Up: No issues - Copay = $0.00

## 2024-02-14 DIAGNOSIS — J189 Pneumonia, unspecified organism: Principal | ICD-10-CM

## 2024-02-14 MED ORDER — ALBUTEROL SULFATE HFA 90 MCG/ACTUATION AEROSOL INHALER
Freq: Four times a day (QID) | RESPIRATORY_TRACT | 0 refills | 0.00000 days | PRN
Start: 2024-02-14 — End: ?

## 2024-02-15 DIAGNOSIS — Z1211 Encounter for screening for malignant neoplasm of colon: Principal | ICD-10-CM

## 2024-02-15 DIAGNOSIS — Z1231 Encounter for screening mammogram for malignant neoplasm of breast: Principal | ICD-10-CM

## 2024-02-15 DIAGNOSIS — Z124 Encounter for screening for malignant neoplasm of cervix: Principal | ICD-10-CM

## 2024-02-15 MED ORDER — ALBUTEROL SULFATE HFA 90 MCG/ACTUATION AEROSOL INHALER
Freq: Four times a day (QID) | RESPIRATORY_TRACT | 0 refills | 0.00000 days | Status: CP | PRN
Start: 2024-02-15 — End: ?

## 2024-02-15 NOTE — Unmapped (Signed)
 Ms. Dunkleberger,    It is so great to meet you! I'm going to enjoy being your doctor. A few things: I think compression stockings would be helpful. You can get these at any medical supply store. Start with low compression 15~5mmHg and try to work your way up to moderate compression 20-30mmHg.   I am also going to look into lymphedema clinic options for you given your history of pelvic surgery. They should call oyu. Give them a few weeks.  The surgery team upstairs will call you regarding your colonoscopy.    Our team will work on the back end to obtain records for your Pap and Mammogram.    Come back at your convenience and we will discuss long Covid. I will do some research on treatment options before that visit.     Lynwood KATHEE Gasman, MD

## 2024-02-15 NOTE — Unmapped (Signed)
 Assessment and Plan:      Assessment & Plan  Lymphedema  Exam c/w lymphedema, suspect related to history of pelvic surgery given prior renal transplant.   Orders:    Ambulatory referral to Physical Therapy; Future         Return in about 4 weeks (around 03/14/2024), or Long covid.        Subjective:      Chief Complaint   Patient presents with    Establish Care     Establish care Fasting         Gina Hart 48 y.o.female  is being seen for a complete physical exam.    She has a medical history significant for prior kidney transplant secondary to hypertensive kidney disease.  She continues to follow with the renal transplant team at [facility].    Her only complaint today is some longstanding swelling in her bilateral feet.  She is uncertain exactly when this started.  It is not necessarily any worse today than usual.  Does not seem associated with any particular time of day or activity.  She denies any chest pain or shortness of breath.    She also mentions concern for long COVID given continued anosmia after having COVID about a year ago.  Thinks she may also have some fatigue related to this.  She is willing to table this conversation until follow-up in 1 month.    She believes that she is up-to-date on both her breast cancer and cervical cancer screenings.  She gives her permission for us  to send records request to her prior providers to get these on file and update her chart accordingly.    She has never had a colonoscopy.  She is amenable to referral for colonoscopy today.    Menstrual concerns: N/A post menopausal  Contraception: post menopausal.     Objective:      Physical Exam   BP 110/84 (BP Site: L Arm, BP Position: Sitting, BP Cuff Size: Large)  - Pulse 66  - Temp 36.8 ??C (98.2 ??F) (Temporal)  - Resp 20  - Ht 149 cm (4' 10.66)  - Wt 95.1 kg (209 lb 9.6 oz)  - LMP 04/30/2016  - SpO2 94%  - BMI 42.82 kg/m??    BP Readings from Last 3 Encounters:   02/15/24 110/84   11/05/23 130/85   10/12/23 119/69      Wt Readings from Last 3 Encounters:   02/15/24 95.1 kg (209 lb 9.6 oz)   01/21/24 94.8 kg (209 lb)   11/05/23 95.2 kg (209 lb 12.8 oz)            Healthcare Maintenance:    Breast Cancer Screening-  Age 44+Mammogram date: 05/16/2016 patient believes that she is up-to-date here.  I sent this to us  sending record request.      Colonoscopy- Colon Cancer Screening Age 37+ - surgery referral placed today.  Colonoscopy date: Not Found  FOBT/FIT date: Not Found  Sigmoidoscopy date: Not Found  FIT-DNA date: Not Found       Cervical Cancer Screening- Age 44-29 w/out HPV, 30-65 + with HPVNo results found for: INTERPGYN, SPECADGYN, HPVRES patient believes she is up-to-date.  Will send a records request to her prior provider.      HIV screening(as per USPSTF age 53-65)-    Lab Results   Component Value Date    HIV Nonreactive 01/11/2017    up to date    Hep C screening (age 22-64 year old  as per USPSTF guidelines)-   Lab Results   Component Value Date    HEPCAB Nonreactive 01/02/2018    UTD, results reviewed     Immunization History:  Shingles (Shingrix): UTD.     Pneumovax/Prevnar: UTD.      Tdap: UTD. TD, ADSORBED, 5LF TETANUS TOXOID, PF(TENIVAC/DECAVAC) 06/15/2020   Flu Vaccine:N/AInfluenza Virus Vaccine, unspecified formulation       ASCVD Risk assessment:  The 10-year ASCVD risk score (Arnett DK, et al., 2019) is: 1.2%   Statin Indicated: Statin not indicated, will continue to monitor regularly

## 2024-02-18 DIAGNOSIS — Z940 Kidney transplant status: Principal | ICD-10-CM

## 2024-02-19 DIAGNOSIS — Z940 Kidney transplant status: Principal | ICD-10-CM

## 2024-02-19 MED ORDER — MAGNESIUM OXIDE 400 MG (241.3 MG MAGNESIUM) TABLET
ORAL_TABLET | Freq: Every day | ORAL | 11 refills | 30.00000 days | Status: CP
Start: 2024-02-19 — End: 2025-02-18

## 2024-03-03 ENCOUNTER — Ambulatory Visit: Admit: 2024-03-03 | Discharge: 2024-04-01 | Payer: Medicare (Managed Care)

## 2024-03-03 ENCOUNTER — Ambulatory Visit: Admit: 2024-03-03 | Payer: Medicare (Managed Care)

## 2024-03-03 DIAGNOSIS — I890 Lymphedema, not elsewhere classified: Principal | ICD-10-CM

## 2024-03-03 NOTE — Progress Notes (Signed)
 [facility]  Pulmonary Clinic - Initial Visit    Referring Physician :  Verdon Tedi Eans*  PCP:     Marlee Lynwood Benders, MD  Reason for Consult:   Cough    ASSESSMENT and PLAN     Vicki Mercer is a 48 y.o. female with history of renal transplant (2018), obesity, HTN, recurrent UTIs who presents for a new patient establish care visit for cough.     Chronic cough  Chronic cough worse after covid-19 infection. Some improvement with albuterol  PRN.   - symbicort  2 puff BID  - Spacer provided in clinic    Dyspnea  Feels short of breath, difficulty walking on flat surface. This is likely multifactorial in setting of mild restriction on FVL, post covid syndrome, scoliosis and elevated hemidiaphragm. More investigation necessary.   - Follow up lung volumes (suggest restriction on FVL)  - Sniff test (elevated hemidiaphragm on xray)  - Pulmonary rehab referral placed.       Plan of care was discussed with the patient who acknowledged understanding and is in agreement.    Patient will No follow-ups on file. or sooner if needed.      Vicki Mercer was seen, examined and discussed with Dr. Wanda Pippin who agrees with the assessment and plan above.     RR:Wpxpuj Antoinette Harri*, Marlee Lynwood Benders, MD    Maurilio Leer, MD, MPH  Pulmonary and Critical Care Fellow    HISTORY:      History of Present Illness: Vicki Mercer is a 48 y.o. female never smoker with PMHx of kidney transplant, long covid syndrome who is seen in consultation at the request of Harrington, Verdon Poser* for comprehensive evaluation of chronic cough and shortness of breath.     History of Present Illness  Vicki Mercer is a 48 year old female with a history of kidney transplant and long COVID who presents with chronic cough. Referred by the post COVID recovery clinic for history of chronic cough.    She experiences a chronic intermittent cough that occurs with exertion. The exact onset of the cough is unclear. She uses albuterol  nebulizers as needed for symptom relief.    Her medical history includes a kidney transplant, and she is currently immunosuppressed, taking cyclosporine . 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 She does not have a history of PTLD.    She has a history of long COVID since August 07, 2020, with initial symptoms of fatigue, reduced appetite, loss of smell, fever, cough, and shortness of breath. Most symptoms have resolved except for brain fog, cough, and dyspnea on exertion.    Previous pulmonary function tests before her transplant in 2016 showed normal spirometry with severely reduced DLCO. An echocardiogram in 2018 showed normal EF with mild elevation in pulmonary artery systolic pressure.    A chest X-ray from July 20, 2023, showed bronchial thickening, while a CT chest from October 12, 2023, was normal without enlarged lymph nodes.    She was seen by ENT in 2021 and underwent PET CT, laryngoscopy, and a follow-up PET scan in 2024. She has nasal congestion and septal deviation. She has tried budesonide  nasal irrigations for topical nasal steroids to see if this would help with inflammation, and has undergone allergy testing.    This morning woke up with some congestion in her throat. Still can't breathe through her nose. Doesn't have cough alll the time. It comes and goes. Wakes up in the morning with congestion.  She uses albuterol  PRN which provides short term relief. Her SOB is worse with activity. Reports that walking on flat ground, she would need to stop and rest after about 100 yards. No pets but is exposed to a cat about 18 hours per week.     History of scoliosis, sp repair in 1991.     OSH records and referral documentation reviewed.     Review of Systems:  A comprehensive review of systems was completed and negative except as noted in HPI.    PMHx:  - Renal transplant 2018  - Hx thickened lingual tonsils, follows with ENT for chronic sinusitis and nasal obstruction    PSHx:  - Renal transplant 2018  - Scoliosis surgery 1991    FHx:  - Family history of hypertension    Social   Smoking (cigarettes, vaping, marijuana)   EtoH  Illicits  Occupational:  Exposures: no asbestos, silica, cermaics, dusts, molds, hot tubs, sauna or flooding exposures.   Pets: none    Relevant Medications:     Allergies: Reviewed.    Other History:  The past medical history, surgical history, social history, family history, medications and allergies were personally reviewed and updated in the patient's electronic medical record. Pertinent items are noted above.           PHYSICAL EXAM:      Physical Exam:  Vitals:    03/05/24 1000   BP: 133/83   BP Site: L Arm   BP Position: Sitting   BP Cuff Size: Medium   Pulse: 68   SpO2: 97%   Weight: 93.9 kg (207 lb)     Body mass index is 42.29 kg/m??.    General: WDWN/Obese/Cachectic, Non-toxic. NAD.   Eyes: PERRL. Anicteric sclera.   ENT:  Nasal mucosa clear. No polyps seen. No crusting.  Lymph: No cervical or supraclavicular lymphadenopathy.  Respiratory: Normal WOB on room air. No   Cardiovascular: RRR, +s1/s2, Normal P2, no RV heave, No MRG, No edema  Abdomen: NABS, Soft, Non-tender, non-distended.  Musculoskeletal/extremities: Normal muscle tone and strength. No cyanosis. No clubbing.   Skin: No skin rashes on clothed exam  Neuro: Alert and oriented x 3. CN grossly intact. No focal neurological deficits  Psych: mood normal, affect congruent         LABORATORY and RADIOLOGY DATA:     Pulmonary Function Tests/Interpretation:      Pulmonary Function Test Results  Date FVC FEV1 FEV1/FVC TLC DLCO Other   05/14/15 2.17 (85) 1.85 (87) 85  11.4 (52) Not adjusted for hgb   01/28/24 1.39 (54%) 1.2 (56%) 87  9.97 (56%)      :  - 05/04/15 - normal aside from inappropriately low HR increase with exertion    Pertinent Laboratory Data:  - Hg 13.8       Pertinent Imaging Data:  Images were personally reviewed with attending.     CT chest 11/03/2023 - scoliosis s/o spinal fusions, otherwise wnl  Echo 7/12/208 -  mildly elevated PASP    PSG 2021 reportedly normal aside from periodic limb movements

## 2024-03-03 NOTE — Unmapped (Signed)
 [facility] OCCUPATIONAL THERAPY [place]  OUTPATIENT OCCUPATIONAL THERAPY  03/03/2024  Note Type: Evaluation       Patient Name: Gina Hart  Date of Birth:02-Feb-1976  Diagnosis:   Encounter Diagnosis   Name Primary?    Lymphedema Yes     Referring MD:  Marlee Lynwood Benders, *     Date of Onset of Impairment-06/26/2017  Date OT Care Plan Established or Reviewed-03/03/2024  Date OT Treatment Started-03/03/2024   Visit Count: 1  Plan of Care Effective Date: 03/03/2024 - 04/28/2024    Assessment/Plan:    Assessment  Assessment details:    Pt is a 48 year old female with a medical and treatment dx of lymphedema. Pt currently presents with stage II secondary lymphedema in bilateral lower extremities. Pt does have a complicated medical hx where lymphedema will need to be treated carefully with appropriate assessment of kidney function throughout treatment. Lymphedema will need to be gradual as to not overload working kidney. This will also be taken into consideration with management including settings for pneumatic pump.  Pt demonstrates body system impairments including edema, decreased skin integrity in bilateral LE that has the potential to impact her ADL, IADL functions, ambulation, as well as general health. She would benefit from outpatient occupational therapy to address the above-mentioned impairments to return to PLOF with independent management of lymphedema. Pt would benefit from a pneumatic pump and additional compression for daily use.       Impairments: decreased skin integrity, increased edema and decreased endurance        Prognosis: good prognosis    Personal Factors/Comorbidities: 3+      Examination of Body Systems: activity/participation, lymphatic and integumentary    Clinical Decision Making: complex    Positive Prognosis Rationale: age, motivated for treatment and Pain Status.  Negative Prognosis Rationale: medical status/condition and chronicity of condition.    Clinical Presentation: evolving    Therapy Goals Goals:       Short Term Goals (4 weeks)  1. Pt's circumference of BLE will be reduced by 1cm at all measurements within 4  weeks.   2. Pt's skin integrity of BLE will improve in 4 weeks to decrease potential for infection.  3. Pt/family will be educated on self MLD and recall it with 100% accuracy to perform in the home in  4 weeks.   4. Pt's skin texture of BLE will improve to a soft, supple state within 4 Weeks.     Long Term Goals (8 weeks)  1. Pt's circumference of BLE will be reduced by 1.5-2cm at all measurements within 8 weeks to increase ADL/IADL function.   2. Pt will recall all precautions to prevent lymphedema exacerbation at 100% in 8 weeks.  3. Pt will be independent with home management of lymphedema in 8 weeks.       Plan    Therapy options: will be seen for skilled occupational therapy services    Planned therapy interventions: 97110-Therapeutic Exercises, 97140-Manual Therapy, 97168-Re-evaluation (OT), 97530-Therapeutic Activities, 97750-Physical Performance Test, 97763-Orthotic and/or prosthetic management/training (subsequent encounter) and 97760-Orthotic Fitting/Training (initial encounter)    DME Equipment: compression pump and compression garments.    Frequency: 1-2x per week.    Duration in weeks: 8 weeks    Education provided to: patient.    Education provided: HEP, Furniture conservator/restorer, DME education, Lymphedema precautions, Role of therapy in Rehabilitation, Manual lymph drainage, Fall prevention, Treatment options and plan and Symptom management    Education results: needs reinforcement.    Communication/Consultation: Medicare Cert/POC  sent to Referring Provider.      Total Treatment Time: 50      Treatment rendered today:      Initial evaluation, education        Subjective:     History of Present Condition     Date of onset:  06/26/2017    History of Present Condition/Chief Complaint:  Pt is a 48 year old female with a medical dx of lymphedema. Pt reports that she had a renal transplant in 2018 (R) and that her L kidney is non-functioning. Pt reports that she did not notice swelling in BLE post surgery, specifically R ankle. She reports after kidney transplant she noticed that R foot/ankle started to swell more and would not go down as easily as they previously did. Pt reports that her PCP recommended compression socks and she has gotten 15-25mmHg compression and tolerates them well. Pt is seeking occupational therapy at this time to become independent with management of lymphedema symptoms.     PMHx: Long Covid (2022), hx of scoliosis, s/p repair 1991, Renal transplant 2018, HTN. Pt is being followed by Critical Care Medicine.   Subjective:  Pt reports no pain with BLE. Pt does report heaviness in BLE.      Quality of life:  Good  Pain:     no pain reported      Pain related Behaviors:  None    Red Flags:  Kidney  Precautions/Equipment   Precautions:  Hypertension    Current Braces/Orthoses:  None    Equipment Currently Used:  None  Prior Functional Status     Physical limitation(s):  Pt reports limitations due to long Covid, swelling in BLE s/p kidney transplant  Current Functional Status:    limited household activities and limited standing tolerance  Social Support:     Lives Environment:  One-story house    Lives with:  Adult children    Hand dominance:  Right    Communication Preference:  Verbal, written and visual  Barriers to Learning:  No Barriers    Work/School:  [employer]  Diagnostic Tests:     None    Treatments:     None      Current treatment: occupational therapy    Patient Goals:     Patient/Family goals for therapy:  Decreased edema, improve fit of clothing, improve fit of shoes, improved ambulation, other and obtain compression garment    Other patient goals:  Obtain pneumatic pump      Objective:   Lymphedema:   Treatment:     Lymph node removal: No      Chemotherapy: No      Radiation therapy: No      Previous lymphedema treatment: No      Reoccurrence?: No    Current Management:     Daytime:  Knee high stockings (15-37mmHg)    Nighttime:  Nothing  Contraindications:     Contraindications:  Neck treatment contraindications    Neck treatment contraindications:  Hyper/hypotension  Objective:     Location of swelling:  right, left, foot, knee, calf, toes and ankle    Fold thickness:  Normal    Fibrosis: No      Skin assessment:  Edema, taut and dry    Color:  flesh    Temperature:  Normal    Hair growth:  Normal    Stemmer's sign:  Positive    Wound: No      Pitting: Yes  Numbers; edema:  1+    Additional objective findings:    RLE  MTP: 21.1cm  Heel: 31.6cm  Ankle: 26.9cm  10 cm: 33.1cm  20 cm: 41cm  Knee:  42cm  10cm: 51cm  20cm: 63.8cm     LLE:  MTP: 20.5cm  Heel: 31.7cm  Ankle: 26cm  10 cm: 31.5cm  20 cm: 40cm  Knee:  40cm  10cm: 53cm  20cm: 64cm   Posture/Observations:     Sitting:  Rounded shoulders    Sleeping:  Bed  ROM and Strength:     ROM and Strength findings:  BLE WFL  Strength: BLE 4/5 with MMT  Tests:     Lower Extremity Functional Scale Score (out of 80):  18    LLI Scale:  25        I attest that I have reviewed the above information.  Signed: Lauraine JAYSON Custard, OT  03/03/2024 10:56 AM

## 2024-03-03 NOTE — Unmapped (Signed)
 [facility]  Pulmonary Clinic - Initial Visit    Referring Physician :  Verdon Tedi Eans*  PCP:     Marlee Lynwood Benders, MD  Reason for Consult:   Cough    ASSESSMENT and PLAN     Gina Hart is a 48 y.o. female with history of renal transplant (2018), obesity, HTN, recurrent UTIs who presents for a new patient establish care visit for cough.     Chronic cough  Chronic cough worse after covid-19 infection. Some improvement with albuterol  PRN.   - symbicort  2 puff BID  - Spacer provided in clinic    Dyspnea  Feels short of breath, difficulty walking on flat surface. This is likely multifactorial in setting of mild restriction on FVL, post covid syndrome, scoliosis and elevated hemidiaphragm. More investigation necessary.   - Follow up lung volumes (suggest restriction on FVL)  - Sniff test (elevated hemidiaphragm on xray)  - Pulmonary rehab referral placed.       Plan of care was discussed with the patient who acknowledged understanding and is in agreement.    Patient will No follow-ups on file. or sooner if needed.      Gina Hart was seen, examined and discussed with Dr. Wanda Pippin who agrees with the assessment and plan above.     RR:Wpxpuj Antoinette Harri*, Marlee Lynwood Benders, MD    Maurilio Leer, MD, MPH  Pulmonary and Critical Care Fellow    HISTORY:      History of Present Illness: Gina Hart is a 48 y.o. female never smoker with PMHx of kidney transplant, long covid syndrome who is seen in consultation at the request of Harrington, Verdon Poser* for comprehensive evaluation of chronic cough and shortness of breath.     History of Present Illness  Gina Hart is a 48 year old female with a history of kidney transplant and long COVID who presents with chronic cough. Referred by the post COVID recovery clinic for history of chronic cough.    She experiences a chronic intermittent cough that occurs with exertion. The exact onset of the cough is unclear. She uses albuterol  nebulizers as needed for symptom relief.    Her medical history includes a kidney transplant, and she is currently immunosuppressed, taking cyclosporine . 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 She does not have a history of PTLD.    She has a history of long COVID since August 07, 2020, with initial symptoms of fatigue, reduced appetite, loss of smell, fever, cough, and shortness of breath. Most symptoms have resolved except for brain fog, cough, and dyspnea on exertion.    Previous pulmonary function tests before her transplant in 2016 showed normal spirometry with severely reduced DLCO. An echocardiogram in 2018 showed normal EF with mild elevation in pulmonary artery systolic pressure.    A chest X-ray from July 20, 2023, showed bronchial thickening, while a CT chest from October 12, 2023, was normal without enlarged lymph nodes.    She was seen by ENT in 2021 and underwent PET CT, laryngoscopy, and a follow-up PET scan in 2024. She has nasal congestion and septal deviation. She has tried budesonide  nasal irrigations for topical nasal steroids to see if this would help with inflammation, and has undergone allergy testing.    This morning woke up with some congestion in her throat. Still can't breathe through her nose. Doesn't have cough alll the time. It comes and goes. Wakes up in the morning with congestion.  She uses albuterol  PRN which provides short term relief. Her SOB is worse with activity. Reports that walking on flat ground, she would need to stop and rest after about 100 yards. No pets but is exposed to a cat about 18 hours per week.     History of scoliosis, sp repair in 1991.     OSH records and referral documentation reviewed.     Review of Systems:  A comprehensive review of systems was completed and negative except as noted in HPI.    PMHx:  - Renal transplant 2018  - Hx thickened lingual tonsils, follows with ENT for chronic sinusitis and nasal obstruction    PSHx:  - Renal transplant 2018  - Scoliosis surgery 1991    FHx:  - Family history of hypertension    Social   Smoking (cigarettes, vaping, marijuana)   EtoH  Illicits  Occupational:  Exposures: no asbestos, silica, cermaics, dusts, molds, hot tubs, sauna or flooding exposures.   Pets: none    Relevant Medications:     Allergies: Reviewed.    Other History:  The past medical history, surgical history, social history, family history, medications and allergies were personally reviewed and updated in the patient's electronic medical record. Pertinent items are noted above.           PHYSICAL EXAM:      Physical Exam:  Vitals:    03/05/24 1000   BP: 133/83   BP Site: L Arm   BP Position: Sitting   BP Cuff Size: Medium   Pulse: 68   SpO2: 97%   Weight: 93.9 kg (207 lb)     Body mass index is 42.29 kg/m??.    General: WDWN/Obese/Cachectic, Non-toxic. NAD.   Eyes: PERRL. Anicteric sclera.   ENT:  Nasal mucosa clear. No polyps seen. No crusting.  Lymph: No cervical or supraclavicular lymphadenopathy.  Respiratory: Normal WOB on room air. No   Cardiovascular: RRR, +s1/s2, Normal P2, no RV heave, No MRG, No edema  Abdomen: NABS, Soft, Non-tender, non-distended.  Musculoskeletal/extremities: Normal muscle tone and strength. No cyanosis. No clubbing.   Skin: No skin rashes on clothed exam  Neuro: Alert and oriented x 3. CN grossly intact. No focal neurological deficits  Psych: mood normal, affect congruent         LABORATORY and RADIOLOGY DATA:     Pulmonary Function Tests/Interpretation:      Pulmonary Function Test Results  Date FVC FEV1 FEV1/FVC TLC DLCO Other   05/14/15 2.17 (85) 1.85 (87) 85  11.4 (52) Not adjusted for hgb   01/28/24 1.39 (54%) 1.2 (56%) 87  9.97 (56%)      :  - 05/04/15 - normal aside from inappropriately low HR increase with exertion    Pertinent Laboratory Data:  - Hg 13.8       Pertinent Imaging Data:  Images were personally reviewed with attending.     CT chest 11/03/2023 - scoliosis s/o spinal fusions, otherwise wnl  Echo 7/12/208 -  mildly elevated PASP    PSG 2021 reportedly normal aside from periodic limb movements

## 2024-03-05 ENCOUNTER — Ambulatory Visit
Admit: 2024-03-05 | Discharge: 2024-03-06 | Payer: Medicare (Managed Care) | Attending: Student in an Organized Health Care Education/Training Program | Primary: Student in an Organized Health Care Education/Training Program

## 2024-03-05 DIAGNOSIS — R0600 Dyspnea, unspecified: Principal | ICD-10-CM

## 2024-03-05 DIAGNOSIS — R053 Chronic cough: Principal | ICD-10-CM

## 2024-03-05 MED ORDER — BUDESONIDE-FORMOTEROL HFA 160 MCG-4.5 MCG/ACTUATION AEROSOL INHALER
RESPIRATORY_TRACT | 11 refills | 0.00000 days | Status: CP
Start: 2024-03-05 — End: ?

## 2024-03-05 NOTE — Unmapped (Signed)
 Chronic cough worse after covid-19 infection. Some improvement with albuterol  PRN.   - symbicort  2 puff BID  - Spacer provided in clinic

## 2024-03-05 NOTE — Unmapped (Signed)
 Feels short of breath, difficulty walking on flat surface. This is likely multifactorial in setting of mild restriction on FVL, post covid syndrome, scoliosis and elevated hemidiaphragm. More investigation necessary.   - Follow up lung volumes (suggest restriction on FVL)  - Sniff test (elevated hemidiaphragm on xray)  - Pulmonary rehab referral placed.

## 2024-03-05 NOTE — Unmapped (Signed)
 Hello Bascom Sluder,    It was great meeting you today. Please see our plan from today below:  - START symbicort  2 puff twice per day, rinse your mouth out afterwards  - I have sent a referral in for pulmonary rehabilitation    Please return to clinic in 6 months.     If you have any non-urgent questions or concerns please reach out by calling the clinic [phone]) or via MyChart.    Maurilio Leer, MD, MPH  Pulmonary and Critical Care Fellow

## 2024-03-05 NOTE — Unmapped (Signed)
 I saw and evaluated the patient, participating in the key portions of the service.  I reviewed the resident???s note.  I agree with the resident???s findings and plan.     48 yo woman with h/o scoliosis, obesity, renal transplant on cyclosporine  and prednisone  who is here for cough and shortness of breath. Starting ICS/LABA for likely post-viral cough. For dypsnea, she has a history of scoliosis s/p surgery in 1990s, with FVL c/w possible restriction. In addition to getting lung volumes, will workup elevated L hemidiaphragm with Sniff test.    Wanda LITTIE Pippin, MD

## 2024-03-10 DIAGNOSIS — Z1211 Encounter for screening for malignant neoplasm of colon: Principal | ICD-10-CM

## 2024-03-10 NOTE — Progress Notes (Signed)
 ------------------------------------------------------------------------------- Summary: Colon cancer screening -------------------------------------------------------------------------------             Vicki Mercer 48 y.o. 1976/04/07 Phone: [phone] (home)  Address: [address] [facility] [place] [address]  MRN: [id number] Primary MD : Lynwood Con Gasman  [facility]   Problem List Items Addressed This Visit       Other   Colon cancer screening - Primary   48 year old female with multiple medical problems presents for colon cancer screening.  Colonoscopy is recommended however she will require a preoperative anesthesia visit to help coordinate care.  Patient is apprehensive about undergoing procedure at this time and would like to take some time to consider it.  She may follow-up here as needed      Endoscopy Consult Note  Past Medical History:  Diagnosis Date  . ESRD (end stage renal disease)    (CMS-HCC)   . Focal segmental glomerulosclerosis 1998  . Hypertension   . Pulmonary hypertension    (CMS-HCC)   . Scoliosis (and kyphoscoliosis), idiopathic    Patient Active Problem List  Diagnosis  . Focal segmental glomerulosclerosis  . Hypertension  . Displacement of lumbar intervertebral disc  . Thoracic or lumbosacral neuritis or radiculitis  . Anemia  . Mixed anxiety and depressive disorder  . Pulmonary hypertension    (CMS-HCC)  . Kidney replaced by transplant (HHS-HCC)  . Immunosuppressed status (HHS-HCC)  . Aftercare following organ transplant  . Neck pain  . Postlaminectomy syndrome, thoracic  . Radicular pain of right lower extremity  . Low back pain of over 3 months duration  . Viral upper respiratory tract infection  . Morbid (severe) obesity due to excess calories (CMS-HCC)  . COVID  . Cervical dysplasia  . Chronic cough  . Dyspnea  . Colon cancer screening   Past Surgical History:  Procedure Laterality Date  . BACK SURGERY     . CHG US  GUIDE, VASCULAR ACCESS Right 07/11/2013   Procedure: ULTRASOUND GUIDANCE FOR VASC ACCESS REQUIRING US  EVAL OF POTENTIAL ACCESS SITES;  Surgeon: Fairy JINNY Moons, MD;  Location: MAIN OR [facility];  Service: Vascular  . PR AV ANAST,FOREARM VEIN TRANSPOSITION Right 07/11/2013   Procedure: R. UE BRACHIOCEPHALIC FISTULA;  Surgeon: Fairy JINNY Moons, MD;  Location: MAIN OR [facility];  Service: Vascular  . PR AV ANAST,UP ARM BASILIC VEIN TRANSPOSIT Right 07/11/2013   Procedure: BASILIC VEIN TRANSPOSITION;  Surgeon: Fairy JINNY Moons, MD;  Location: MAIN OR [facility];  Service: Vascular  . PR RIGHT HEART CATH O2 SATURATION & CARDIAC OUTPUT N/A 06/16/2015   Procedure: Right Heart Catheterization;  Surgeon: Zachary Prentice Car, MD;  Location: [facility];  Service: Cardiology  . PR TRANSPLANTATION OF KIDNEY Right 01/11/2017   Procedure: RENAL ALLOTRANSPLANTATION, IMPLANTATION OF GRAFT; WITHOUT RECIPIENT NEPHRECTOMY;  Surgeon: Marsa Sam Boss, MD;  Location: MAIN OR [facility];  Service: Transplant   Allergies  Allergen Reactions  . Ibuprofen Swelling    Other reaction(s): SWELLING/EDEMA  . Sulfa (Sulfonamide Antibiotics)    Meds:  Current Outpatient Medications:  .  acetaminophen (TYLENOL) 325 MG tablet, Take 1-2 tablets (325-650 mg total) by mouth every four (4) hours as needed for pain., Disp: 100 tablet, Rfl: 0 .  albuterol 2.5 mg /3 mL (0.083 %) nebulizer solution, Inhale 3 mL (2.5 mg total) by nebulization continuous as needed., Disp: , Rfl:  .  amlodipine (NORVASC) 2.5 MG tablet, Take 3 tablets (7.5 mg total) by mouth daily., Disp: 90 tablet, Rfl: 11 .  aspirin (ECOTRIN)  81 MG tablet, Take 1 tablet (81 mg total) by mouth daily., Disp: , Rfl:  .  biotin 5 mg cap, Take 1 capsule (5,000 mcg total) by mouth daily., Disp: 30 capsule, Rfl: 11 .  budesonide-formoterol (SYMBICORT) 160-4.5 mcg/actuation inhaler, 2 puff with spacer bid, Disp: 10.2 g, Rfl: 11 .  chlorthalidone (HYGROTON) 25 MG tablet, Take 1 tablet  (25 mg total) by mouth every morning., Disp: 90 tablet, Rfl: 3 .  cholecalciferol, vitamin D3-50 mcg, 2,000 unit,, 50 mcg (2,000 unit) cap, Take 1 capsule (50 mcg total) by mouth in the morning., Disp: 30 capsule, Rfl: 11 .  cycloSPORINE modified (NEORAL) 25 MG capsule, Take 4 capsules (100 mg total) by mouth two (2) times a day., Disp: 240 capsule, Rfl: 11 .  ferrous sulfate 325 (65 FE) MG tablet, Take 1 tablet (325 mg total) by mouth two (2) times a day., Disp: 60 tablet, Rfl: 11 .  fluticasone propionate (FLONASE) 50 mcg/actuation nasal spray, 1 spray by Each Nare route daily., Disp: 16 g, Rfl: 4 .  loratadine (CLARITIN) 10 mg tablet, Take 1 tablet (10 mg total) by mouth daily as needed for allergies., Disp: , Rfl:  .  magnesium oxide (MAG-OX) 400 mg (241.3 mg elemental magnesium) tablet, Take 1 tablet (400 mg total) by mouth daily., Disp: 30 tablet, Rfl: 11 .  meclizine  (ANTIVERT ) 25 mg tablet, Take 1 tablet (25 mg total) by mouth as needed., Disp: , Rfl:  .  melatonin 5 mg tablet, Take 1 tablet (5 mg total) by mouth nightly as needed., Disp: 30 tablet, Rfl: 11 .  metoPROLOL tartrate (LOPRESSOR) 100 MG tablet, TAKE ONE TABLET TWICE DAILY, Disp: 180 tablet, Rfl: 3 .  polyethylene glycol (GLYCOLAX) 17 gram/dose powder, Take 17 g by mouth continuous as needed., Disp: , Rfl:  .  potassium chloride 20 MEQ ER tablet, Take 2 tablets (40 mEq total) by mouth two (2) times a day., Disp: 120 tablet, Rfl: 11 .  predniSONE (DELTASONE) 5 MG tablet, TAKE ONE TABLET BY MOUTH ONCE DAILY, Disp: 30 tablet, Rfl: 11 .  rosuvastatin (CRESTOR) 5 MG tablet, Take 1 tablet (5 mg total) by mouth daily., Disp: 90 tablet, Rfl: 3 .  albuterol HFA 90 mcg/actuation inhaler, INHALE TWO PUFFS BY MOUTH EVERY 6 HOURS AS NEEDED FOR WHEEZING (Patient not taking: Reported on 03/10/2024), Disp: 8.5 g, Rfl: 0 .  budesonide (PULMICORT) 0.5 mg/2 mL nebulizer solution, 2 mL (0.5 mg total) by Other route two (2) times a day. Add one respule  (0.5mg /64mL) to your morning sinus rinse bottle and shake bottle, then irrigate each nostril. Do the same with an additional respule in the evening. (Patient not taking: Reported on 03/10/2024), Disp: 120 mL, Rfl: 3 .  esomeprazole (NEXIUM) 20 MG capsule, Take 2 capsules (40 mg total) by mouth daily. For two months, take 40mg  (two tablets) at night. Then decrease dose to 20mg  (one tablet) for one month. (Patient not taking: Reported on 03/10/2024), Disp: 60 capsule, Rfl: 2 .  fosfomycin (MONUROL) 3 gram Pack, Take 1 packet by mouth every 3 days for 3 doses. (Patient not taking: Reported on 03/10/2024), Disp: 9 g, Rfl: 0 .  WEGOVY 0.25 MG/0.5 ML SUBCUTANEOUS PEN INJECTOR, Inject 0.25 mg under the skin every seven (7) days. (Patient not taking: Reported on 03/10/2024), Disp: 2 mL, Rfl: 11 SocHx:  reports that she has never smoked. She has never used smokeless tobacco. She reports that she does not drink alcohol and does not use drugs. FamHx:  She indicated that her mother is alive. She indicated that her father is deceased. She indicated that the status of her sister is unknown. She indicated that the status of her maternal grandmother is unknown. She indicated that the status of her maternal grandfather is unknown. She indicated that the status of her paternal grandmother is unknown. She indicated that the status of her paternal grandfather is unknown. She indicated that the status of her daughter is unknown. She indicated that the status of her neg hx is unknown.  Review of Systems Review of Systems  Constitutional:  Negative for chills, fever, malaise/fatigue and weight loss.  HENT:  Negative for congestion and sore throat.   Eyes:  Negative for double vision, photophobia and redness.  Respiratory:  Negative for cough, hemoptysis, shortness of breath, wheezing and stridor.   Cardiovascular:  Negative for chest pain, palpitations, orthopnea, claudication and leg swelling.  Gastrointestinal:  Negative for  abdominal pain, blood in stool, constipation, diarrhea, melena, nausea and vomiting.  Genitourinary:  Negative for frequency, hematuria and urgency.  Musculoskeletal:  Negative for joint pain.  Skin:  Negative for itching and rash.  Neurological:  Negative for dizziness, tremors, focal weakness, seizures, loss of consciousness, weakness and headaches.  Endo/Heme/Allergies:  Negative for polydipsia. Does not bruise/bleed easily.  Psychiatric/Behavioral:  Negative for memory loss and suicidal ideas. The patient does not have insomnia.     Special Needs: No special needs identified History of Present Illness: The patient is 48 y.o., female here for evaluation for Colonoscopy Last colonoscopy:5 to 10 years ago Reasons for testing: Routine screening by age Current symptoms: none Follow will occur in person  Vitals:   03/10/24 1042  BP: (P) 121/80  Pulse: (P) 70  Temp: (P) 36.5 C (97.7 F)   Physical Exam Constitutional:      General: She is not in acute distress.    Appearance: Normal appearance. She is well-developed. She is morbidly obese. She is not ill-appearing, toxic-appearing or diaphoretic.  HENT:     Head: Normocephalic and atraumatic.     Right Ear: External ear normal.     Left Ear: External ear normal.  Eyes:     General: No scleral icterus.       Right eye: No discharge.        Left eye: No discharge.     Extraocular Movements: Extraocular movements intact.     Conjunctiva/sclera: Conjunctivae normal.  Neck:     Thyroid: No thyromegaly.     Vascular: No JVD.     Trachea: No tracheal deviation.  Cardiovascular:     Rate and Rhythm: Normal rate and regular rhythm.     Heart sounds: Normal heart sounds. No murmur heard.    No friction rub. No gallop.  Pulmonary:     Effort: Pulmonary effort is normal. No respiratory distress.     Breath sounds: Normal breath sounds. No stridor. No wheezing, rhonchi or rales.  Chest:     Chest wall: No tenderness.  Abdominal:      General: There is no distension.     Palpations: Abdomen is soft. There is no mass.     Tenderness: There is no abdominal tenderness. There is no right CVA tenderness, left CVA tenderness, guarding or rebound.     Hernia: No hernia is present.  Musculoskeletal:        General: No swelling, tenderness, deformity or signs of injury.     Cervical back: Normal range of motion and neck supple.  Right lower leg: No edema.     Left lower leg: No edema.  Skin:    General: Skin is warm and dry.     Coloration: Skin is not jaundiced or pale.     Findings: No bruising, erythema, lesion or rash.  Neurological:     General: No focal deficit present.     Mental Status: She is alert and oriented to person, place, and time.     Cranial Nerves: No cranial nerve deficit.     Motor: No weakness.     Gait: Gait normal.  Psychiatric:        Mood and Affect: Mood normal.        Behavior: Behavior normal.        Thought Content: Thought content normal.        Judgment: Judgment normal.     Lab Results  Component Value Date   WBC 3.8 (L) 01/14/2024   HGB 13.8 01/14/2024   HCT 40.0 01/14/2024   PLT 246 01/14/2024    Lab Results  Component Value Date   NA 139 01/14/2024   K 3.0 (L) 01/14/2024   CL 101 01/14/2024   CO2 27.7 01/14/2024   BUN 18 01/14/2024   CREATININE 1.1 01/14/2024   GLU 115 (H) 01/14/2024   CALCIUM 9.7 01/14/2024   MG 1.6 (L) 01/14/2024   PHOS 2.5 (L) 01/14/2024    Lab Results  Component Value Date   BILITOT 0.6 10/12/2023   BILIDIR 0.20 10/12/2023   PROT 8.2 10/12/2023   ALBUMIN 3.8 10/12/2023   ALT 16 10/12/2023   AST 21 10/12/2023   ALKPHOS 71 10/12/2023   GGT 61 05/23/2021    Lab Results  Component Value Date   PT 11.4 01/11/2017   INR 1.00 01/11/2017   APTT 31.3 01/11/2017

## 2024-03-10 NOTE — Unmapped (Signed)
 48 year old female with multiple medical problems presents for colon cancer screening.  Colonoscopy is recommended however she will require a preoperative anesthesia visit to help coordinate care.  Patient is apprehensive about undergoing procedure at this time and would like to take some time to consider it.  She may follow-up here as needed

## 2024-03-10 NOTE — Unmapped (Signed)
 Gina Hart  48 y.o. [birth year]  Phone: [phone] (home)   Address: [address]  [facility] [place] [address]   MRN: [id number]  Primary MD : Lynwood Con Gasman   [facility] at [facility]     Problem List Items Addressed This Visit          Other    Colon cancer screening - Primary    48 year old female with multiple medical problems presents for colon cancer screening.  Colonoscopy is recommended however she will require a preoperative anesthesia visit to help coordinate care.  Patient is apprehensive about undergoing procedure at this time and would like to take some time to consider it.  She may follow-up here as needed          Endoscopy Consult Note    Past Medical History:   Diagnosis Date    ESRD (end stage renal disease)    (CMS-HCC)     Focal segmental glomerulosclerosis 1998    Hypertension     Pulmonary hypertension    (CMS-HCC)     Scoliosis (and kyphoscoliosis), idiopathic      Patient Active Problem List   Diagnosis    Focal segmental glomerulosclerosis    Hypertension    Displacement of lumbar intervertebral disc    Thoracic or lumbosacral neuritis or radiculitis    Anemia    Mixed anxiety and depressive disorder    Pulmonary hypertension    (CMS-HCC)    Kidney replaced by transplant (HHS-HCC)    Immunosuppressed status (HHS-HCC)    Aftercare following organ transplant    Neck pain    Postlaminectomy syndrome, thoracic    Radicular pain of right lower extremity    Low back pain of over 3 months duration    Viral upper respiratory tract infection    Morbid (severe) obesity due to excess calories (CMS-HCC)    COVID    Cervical dysplasia    Chronic cough    Dyspnea    Colon cancer screening     Past Surgical History:   Procedure Laterality Date    BACK SURGERY      CHG US  GUIDE, VASCULAR ACCESS Right 07/11/2013    Procedure: ULTRASOUND GUIDANCE FOR VASC ACCESS REQUIRING US  EVAL OF POTENTIAL ACCESS SITES;  Surgeon: Fairy JINNY Moons, MD;  Location: MAIN OR [facility];  Service: Vascular    PR AV ANAST,FOREARM VEIN TRANSPOSITION Right 07/11/2013    Procedure: R. UE BRACHIOCEPHALIC FISTULA;  Surgeon: Fairy JINNY Moons, MD;  Location: MAIN OR [facility];  Service: Vascular    PR AV ANAST,UP ARM BASILIC VEIN TRANSPOSIT Right 07/11/2013    Procedure: BASILIC VEIN TRANSPOSITION;  Surgeon: Fairy JINNY Moons, MD;  Location: MAIN OR [facility];  Service: Vascular    PR RIGHT HEART CATH O2 SATURATION & CARDIAC OUTPUT N/A 06/16/2015    Procedure: Right Heart Catheterization;  Surgeon: Zachary Prentice Car, MD;  Location: [facility];  Service: Cardiology    PR TRANSPLANTATION OF KIDNEY Right 01/11/2017    Procedure: RENAL ALLOTRANSPLANTATION, IMPLANTATION OF GRAFT; WITHOUT RECIPIENT NEPHRECTOMY;  Surgeon: Marsa Sam Boss, MD;  Location: MAIN OR [facility];  Service: Transplant     Allergies   Allergen Reactions    Ibuprofen Swelling     Other reaction(s): SWELLING/EDEMA    Sulfa  (Sulfonamide Antibiotics)      Meds:    Current Outpatient Medications:     acetaminophen  (TYLENOL ) 325 MG tablet, Take 1-2 tablets (325-650 mg total) by mouth every four (4) hours as needed for pain.,  Disp: 100 tablet, Rfl: 0    albuterol  2.5 mg /3 mL (0.083 %) nebulizer solution, Inhale 3 mL (2.5 mg total) by nebulization continuous as needed., Disp: , Rfl:     amlodipine  (NORVASC ) 2.5 MG tablet, Take 3 tablets (7.5 mg total) by mouth daily., Disp: 90 tablet, Rfl: 11    aspirin  (ECOTRIN) 81 MG tablet, Take 1 tablet (81 mg total) by mouth daily., Disp: , Rfl:     biotin  5 mg cap, Take 1 capsule (5,000 mcg total) by mouth daily., Disp: 30 capsule, Rfl: 11    budesonide -formoterol  (SYMBICORT ) 160-4.5 mcg/actuation inhaler, 2 puff with spacer bid, Disp: 10.2 g, Rfl: 11    chlorthalidone  (HYGROTON ) 25 MG tablet, Take 1 tablet (25 mg total) by mouth every morning., Disp: 90 tablet, Rfl: 3    cholecalciferol , vitamin D3-50 mcg, 2,000 unit,, 50 mcg (2,000 unit) cap, Take 1 capsule (50 mcg total) by mouth in the morning., Disp: 30 capsule, Rfl: 11    cycloSPORINE  modified (NEORAL ) 25 MG capsule, Take 4 capsules (100 mg total) by mouth two (2) times a day., Disp: 240 capsule, Rfl: 11    ferrous sulfate  325 (65 FE) MG tablet, Take 1 tablet (325 mg total) by mouth two (2) times a day., Disp: 60 tablet, Rfl: 11    fluticasone  propionate (FLONASE ) 50 mcg/actuation nasal spray, 1 spray by Each Nare route daily., Disp: 16 g, Rfl: 4    loratadine (CLARITIN) 10 mg tablet, Take 1 tablet (10 mg total) by mouth daily as needed for allergies., Disp: , Rfl:     magnesium  oxide (MAG-OX) 400 mg (241.3 mg elemental magnesium ) tablet, Take 1 tablet (400 mg total) by mouth daily., Disp: 30 tablet, Rfl: 11    meclizine (ANTIVERT) 25 mg tablet, Take 1 tablet (25 mg total) by mouth as needed., Disp: , Rfl:     melatonin 5 mg tablet, Take 1 tablet (5 mg total) by mouth nightly as needed., Disp: 30 tablet, Rfl: 11    metoPROLOL  tartrate (LOPRESSOR ) 100 MG tablet, TAKE ONE TABLET TWICE DAILY, Disp: 180 tablet, Rfl: 3    polyethylene glycol (GLYCOLAX ) 17 gram/dose powder, Take 17 g by mouth continuous as needed., Disp: , Rfl:     potassium chloride  20 MEQ ER tablet, Take 2 tablets (40 mEq total) by mouth two (2) times a day., Disp: 120 tablet, Rfl: 11    predniSONE  (DELTASONE ) 5 MG tablet, TAKE ONE TABLET BY MOUTH ONCE DAILY, Disp: 30 tablet, Rfl: 11    rosuvastatin  (CRESTOR ) 5 MG tablet, Take 1 tablet (5 mg total) by mouth daily., Disp: 90 tablet, Rfl: 3    albuterol  HFA 90 mcg/actuation inhaler, INHALE TWO PUFFS BY MOUTH EVERY 6 HOURS AS NEEDED FOR WHEEZING (Patient not taking: Reported on 03/10/2024), Disp: 8.5 g, Rfl: 0    budesonide  (PULMICORT ) 0.5 mg/2 mL nebulizer solution, 2 mL (0.5 mg total) by Other route two (2) times a day. Add one respule (0.5mg /54mL) to your morning sinus rinse bottle and shake bottle, then irrigate each nostril. Do the same with an additional respule in the evening. (Patient not taking: Reported on 03/10/2024), Disp: 120 mL, Rfl: 3    esomeprazole  (NEXIUM ) 20 MG capsule, Take 2 capsules (40 mg total) by mouth daily. For two months, take 40mg  (two tablets) at night. Then decrease dose to 20mg  (one tablet) for one month. (Patient not taking: Reported on 03/10/2024), Disp: 60 capsule, Rfl: 2    fosfomycin (MONUROL ) 3 gram Pack, Take 1 packet  by mouth every 3 days for 3 doses. (Patient not taking: Reported on 03/10/2024), Disp: 9 g, Rfl: 0    WEGOVY  0.25 MG/0.5 ML SUBCUTANEOUS PEN INJECTOR, Inject 0.25 mg under the skin every seven (7) days. (Patient not taking: Reported on 03/10/2024), Disp: 2 mL, Rfl: 11  SocHx:   reports that she has never smoked. She has never used smokeless tobacco. She reports that she does not drink alcohol and does not use drugs.  FamHx:  She indicated that her mother is alive. She indicated that her father is deceased. She indicated that the status of her sister is unknown. She indicated that the status of her maternal grandmother is unknown. She indicated that the status of her maternal grandfather is unknown. She indicated that the status of her paternal grandmother is unknown. She indicated that the status of her paternal grandfather is unknown. She indicated that the status of her daughter is unknown. She indicated that the status of her neg hx is unknown.    Review of Systems  Review of Systems   Constitutional:  Negative for chills, fever, malaise/fatigue and weight loss.   HENT:  Negative for congestion and sore throat.    Eyes:  Negative for double vision, photophobia and redness.   Respiratory:  Negative for cough, hemoptysis, shortness of breath, wheezing and stridor.    Cardiovascular:  Negative for chest pain, palpitations, orthopnea, claudication and leg swelling.   Gastrointestinal:  Negative for abdominal pain, blood in stool, constipation, diarrhea, melena, nausea and vomiting.   Genitourinary:  Negative for frequency, hematuria and urgency.   Musculoskeletal:  Negative for joint pain.   Skin:  Negative for itching and rash.   Neurological: Negative for dizziness, tremors, focal weakness, seizures, loss of consciousness, weakness and headaches.   Endo/Heme/Allergies:  Negative for polydipsia. Does not bruise/bleed easily.   Psychiatric/Behavioral:  Negative for memory loss and suicidal ideas. The patient does not have insomnia.        Special Needs:  No special needs identified History of Present Illness:  The patient is 48 y.o., female here for evaluation for Colonoscopy  Last colonoscopy:5 to 10 years ago  Reasons for testing: Routine screening by age  Current symptoms: none  Follow will occur in person    Vitals:    03/10/24 1042   BP: (P) 121/80   Pulse: (P) 70   Temp: (P) 36.5 ??C (97.7 ??F)     Physical Exam  Constitutional:       General: She is not in acute distress.     Appearance: Normal appearance. She is well-developed. She is morbidly obese. She is not ill-appearing, toxic-appearing or diaphoretic.   HENT:      Head: Normocephalic and atraumatic.      Right Ear: External ear normal.      Left Ear: External ear normal.   Eyes:      General: No scleral icterus.        Right eye: No discharge.         Left eye: No discharge.      Extraocular Movements: Extraocular movements intact.      Conjunctiva/sclera: Conjunctivae normal.   Neck:      Thyroid: No thyromegaly.      Vascular: No JVD.      Trachea: No tracheal deviation.   Cardiovascular:      Rate and Rhythm: Normal rate and regular rhythm.      Heart sounds: Normal heart sounds. No murmur heard.  No friction rub. No gallop.   Pulmonary:      Effort: Pulmonary effort is normal. No respiratory distress.      Breath sounds: Normal breath sounds. No stridor. No wheezing, rhonchi or rales.   Chest:      Chest wall: No tenderness.   Abdominal:      General: There is no distension.      Palpations: Abdomen is soft. There is no mass.      Tenderness: There is no abdominal tenderness. There is no right CVA tenderness, left CVA tenderness, guarding or rebound.      Hernia: No hernia is present. Musculoskeletal:         General: No swelling, tenderness, deformity or signs of injury.      Cervical back: Normal range of motion and neck supple.      Right lower leg: No edema.      Left lower leg: No edema.   Skin:     General: Skin is warm and dry.      Coloration: Skin is not jaundiced or pale.      Findings: No bruising, erythema, lesion or rash.   Neurological:      General: No focal deficit present.      Mental Status: She is alert and oriented to person, place, and time.      Cranial Nerves: No cranial nerve deficit.      Motor: No weakness.      Gait: Gait normal.   Psychiatric:         Mood and Affect: Mood normal.         Behavior: Behavior normal.         Thought Content: Thought content normal.         Judgment: Judgment normal.         Lab Results   Component Value Date    WBC 3.8 (L) 01/14/2024    HGB 13.8 01/14/2024    HCT 40.0 01/14/2024    PLT 246 01/14/2024       Lab Results   Component Value Date    NA 139 01/14/2024    K 3.0 (L) 01/14/2024    CL 101 01/14/2024    CO2 27.7 01/14/2024    BUN 18 01/14/2024    CREATININE 1.1 01/14/2024    GLU 115 (H) 01/14/2024    CALCIUM 9.7 01/14/2024    MG 1.6 (L) 01/14/2024    PHOS 2.5 (L) 01/14/2024       Lab Results   Component Value Date    BILITOT 0.6 10/12/2023    BILIDIR 0.20 10/12/2023    PROT 8.2 10/12/2023    ALBUMIN 3.8 10/12/2023    ALT 16 10/12/2023    AST 21 10/12/2023    ALKPHOS 71 10/12/2023    GGT 61 05/23/2021       Lab Results   Component Value Date    PT 11.4 01/11/2017    INR 1.00 01/11/2017    APTT 31.3 01/11/2017

## 2024-03-14 DIAGNOSIS — Z940 Kidney transplant status: Principal | ICD-10-CM

## 2024-03-14 NOTE — Unmapped (Signed)
 [facility] orders.

## 2024-03-17 DIAGNOSIS — I890 Lymphedema, not elsewhere classified: Principal | ICD-10-CM

## 2024-03-17 NOTE — Unmapped (Signed)
 Patient stopped by the office this morning wanting to schedule her colonoscopy. Patient has been scheduled for 05/15/24 @ 9:30 with an 8:00 arrival, patient will hold low dose aspirin  starting 05/10/24, she will have a pre-op interview with anaesthesia on 05/12/24 @ 10:00 am.

## 2024-03-17 NOTE — Unmapped (Signed)
 Colonoscopy 05/15/24  Z12.11  [id number]  [id number]  [id number]  Outpatient  [facility]

## 2024-03-17 NOTE — Unmapped (Signed)
 [facility] OCCUPATIONAL THERAPY [facility]  OUTPATIENT OCCUPATIONAL THERAPY  03/17/2024  Note Type: Treatment Note       Patient Name: Gina Hart  Date of Birth:Oct 01, 1975  Diagnosis:   Encounter Diagnosis   Name Primary?    Lymphedema Yes     Referring MD:  Marlee Lynwood Benders, *     Date of Onset of Impairment-06/26/2017  Date OT Care Plan Established or Reviewed-03/03/2024  Date OT Treatment Started-03/03/2024   Visit Count: 2  Plan of Care Effective Date: 03/03/2024 - 04/28/2024    Assessment/Plan:    Assessment  Assessment details:        Pt is a 48 year old female with a medical and treatment dx of lymphedema. Pt currently presents with stage II secondary lymphedema in bilateral lower extremities. Pt does have a complicated medical hx where lymphedema will need to be treated carefully with appropriate assessment of kidney function throughout treatment. Lymphedema will need to be gradual as to not overload working kidney. This will also be taken into consideration with management including settings for pneumatic pump.  Pt demonstrates body system impairments including edema, decreased skin integrity in bilateral LE that has the potential to impact her ADL, IADL functions, ambulation, as well as general health. She would benefit from outpatient occupational therapy to address the above-mentioned impairments to return to PLOF with independent management of lymphedema. Pt would benefit from a pneumatic pump and additional compression for daily use.       Impairments: decreased skin integrity, increased edema and decreased endurance        Prognosis: good prognosis    Personal Factors/Comorbidities: 3+      Examination of Body Systems: activity/participation, lymphatic and integumentary    Clinical Decision Making: complex    Positive Prognosis Rationale: age, motivated for treatment and Pain Status.  Negative Prognosis Rationale: medical status/condition and chronicity of condition.    Clinical Presentation: evolving    Therapy Goals      Goals:       Short Term Goals (4 weeks)  1. Pt's circumference of BLE will be reduced by 1cm at all measurements within 4  weeks.   2. Pt's skin integrity of BLE will improve in 4 weeks to decrease potential for infection.  3. Pt/family will be educated on self MLD and recall it with 100% accuracy to perform in the home in  4 weeks.   4. Pt's skin texture of BLE will improve to a soft, supple state within 4 Weeks.     Long Term Goals (8 weeks)  1. Pt's circumference of BLE will be reduced by 1.5-2cm at all measurements within 8 weeks to increase ADL/IADL function.   2. Pt will recall all precautions to prevent lymphedema exacerbation at 100% in 8 weeks.  3. Pt will be independent with home management of lymphedema in 8 weeks.       Plan    Therapy options: will be seen for skilled occupational therapy services    Planned therapy interventions: 97110-Therapeutic Exercises, 97140-Manual Therapy, 97168-Re-evaluation (OT), 97530-Therapeutic Activities, 97750-Physical Performance Test, 97763-Orthotic and/or prosthetic management/training (subsequent encounter) and 97760-Orthotic Fitting/Training (initial encounter)    DME Equipment: compression pump and compression garments.    Frequency: 1-2x per week.    Duration in weeks: 8 weeks    Education provided to: patient.    Education provided: HEP, Furniture conservator/restorer, DME education, Lymphedema precautions, Role of therapy in Rehabilitation, Manual lymph drainage, Fall prevention, Treatment options and plan and Symptom management  Education results: needs reinforcement.    Communication/Consultation: Medicare Cert/POC sent to Referring Provider.      Total Treatment Time: 55      Treatment rendered today:       Pt was positioned in supine with HOB elevated for comfort. Manual lymph drainage pretreatment was completed to abdomen, R axilla and R lateral trunk. Manual lymph drainage was completed to RLE, L axilla and L lateral trunk. Manual lymph drainage was completed to LLE. No compression placed on this date.         Subjective:     History of Present Condition     Date of onset:  06/26/2017    History of Present Condition/Chief Complaint:  Pt is a 48 year old female with a medical dx of lymphedema. Pt reports that she had a renal transplant in 2018 (R) and that her L kidney is non-functioning. Pt reports that she did not notice swelling in BLE post surgery, specifically R ankle. She reports after kidney transplant she noticed that R foot/ankle started to swell more and would not go down as easily as they previously did. Pt reports that her PCP recommended compression socks and she has gotten 15-69mmHg compression and tolerates them well. Pt is seeking occupational therapy at this time to become independent with management of lymphedema symptoms.     PMHx: Long Covid (2022), hx of scoliosis, s/p repair 1991, Renal transplant 2018, HTN. Pt is being followed by Critical Care Medicine.   Subjective:  Pt reports no pain on this date. She reports that she is ready to start treatment. Therapist reviews self MLD with good understanding. Pt reports that pump vendor will be coming to her home Wednesday for trial.      Quality of life:  Good  Pain:     no pain reported      Pain related Behaviors:  None    Red Flags:  Kidney  Precautions/Equipment   Precautions:  Hypertension    Current Braces/Orthoses:  None    Equipment Currently Used:  None  Prior Functional Status     Physical limitation(s):  Pt reports limitations due to long Covid, swelling in BLE s/p kidney transplant  Current Functional Status:    limited household activities and limited standing tolerance  Social Support:     Lives Environment:  One-story house    Lives with:  Adult children    Hand dominance:  Right    Communication Preference:  Verbal, written and visual  Barriers to Learning:  No Barriers    Work/School:  [employer]  Diagnostic Tests:     None    Treatments:     None      Current treatment: occupational therapy    Patient Goals:     Patient/Family goals for therapy:  Decreased edema, improve fit of clothing, improve fit of shoes, improved ambulation, other and obtain compression garment    Other patient goals:  Obtain pneumatic pump      Objective:   Lymphedema:   Treatment:     Lymph node removal: No      Chemotherapy: No      Radiation therapy: No      Previous lymphedema treatment: No      Reoccurrence?: No    Current Management:     Daytime:  Knee high stockings (15-11mmHg)    Nighttime:  Nothing  Contraindications:     Contraindications:  Neck treatment contraindications    Neck treatment contraindications:  Hyper/hypotension  Objective:     Location of swelling:  right, left, foot, knee, calf, toes and ankle    Fold thickness:  Normal    Fibrosis: No      Skin assessment:  Edema, taut and dry    Color:  flesh    Temperature:  Normal    Hair growth:  Normal    Stemmer's sign:  Positive    Wound: No      Pitting: Yes      Numbers; edema:  1+    Additional objective findings:    RLE  MTP: 21.1cm  Heel: 31.6cm  Ankle: 26.9cm  10 cm: 33.1cm  20 cm: 41cm  Knee:  42cm  10cm: 51cm  20cm: 63.8cm     LLE:  MTP: 20.5cm  Heel: 31.7cm  Ankle: 26cm  10 cm: 31.5cm  20 cm: 40cm  Knee:  40cm  10cm: 53cm  20cm: 64cm   Posture/Observations:     Sitting:  Rounded shoulders    Sleeping:  Bed  ROM and Strength:     ROM and Strength findings:  BLE WFL  Strength: BLE 4/5 with MMT  Tests:     Lower Extremity Functional Scale Score (out of 80):  18    LLI Scale:  25    Charges for this date: manual therapy - 55 minutes    I attest that I have reviewed the above information.  Signed: Lauraine JAYSON Custard, OT  03/17/2024 11:06 AM

## 2024-03-19 DIAGNOSIS — Z940 Kidney transplant status: Principal | ICD-10-CM

## 2024-03-20 DIAGNOSIS — Z940 Kidney transplant status: Principal | ICD-10-CM

## 2024-03-24 LAB — CBC W/ DIFFERENTIAL
BASOPHILS ABSOLUTE COUNT: 0 — ABNORMAL LOW
BASOPHILS RELATIVE PERCENT: 0.5
EOSINOPHILS ABSOLUTE COUNT: 0.1
EOSINOPHILS RELATIVE PERCENT: 2.1
HEMATOCRIT: 85.5
HEMOGLOBIN: 14.1
LYMPHOCYTES ABSOLUTE COUNT: 0.8 — ABNORMAL LOW
LYMPHOCYTES RELATIVE PERCENT: 23.5
MEAN CORPUSCULAR HEMOGLOBIN CONC: 34.1
MEAN CORPUSCULAR HEMOGLOBIN: 29.1
MEAN PLATELET VOLUME: 7.9
MONOCYTES ABSOLUTE COUNT: 0.7
MONOCYTES RELATIVE PERCENT: 20.6 — ABNORMAL HIGH
NEUTROPHILS ABSOLUTE COUNT: 1.9
NEUTROPHILS RELATIVE PERCENT: 53.3
PLATELET COUNT: 234
RED BLOOD CELL COUNT: 4.8
RED CELL DISTRIBUTION WIDTH: 14.1
WHITE BLOOD CELL COUNT: 3.6 — ABNORMAL LOW

## 2024-03-24 LAB — PROTEIN / CREATININE RATIO, URINE
CREATININE, URINE: 85.4
PROTEIN URINE: 23.1
PROTEIN/CREAT RATIO, URINE: 270 — ABNORMAL HIGH

## 2024-03-24 LAB — URINALYSIS WITH MICROSCOPY
BILIRUBIN UA: NEGATIVE
GLUCOSE UA: NEGATIVE
KETONES UA: NEGATIVE
LEUKOCYTE ESTERASE UA: NEGATIVE
NITRITE UA: NEGATIVE
PH UA: 6
PROTEIN UA: NEGATIVE
SPECIFIC GRAVITY UA: 1.015
UROBILINOGEN UA: 0.2

## 2024-03-24 LAB — BASIC METABOLIC PANEL
ANION GAP: 7.5
BLOOD UREA NITROGEN: 22 — ABNORMAL HIGH
BUN / CREAT RATIO: 18.3 — ABNORMAL HIGH
CALCIUM: 10
CHLORIDE: 102
CO2: 33.5 — ABNORMAL HIGH
CREATININE: 1.2
EGFR CKD-EPI AA FEMALE: 55
GLUCOSE RANDOM: 89
MAGNESIUM: 1.9
OSMOLALITY CALCULATION: 298
PHOSPHORUS: 3.3
POTASSIUM: 3.2 — ABNORMAL LOW
SODIUM: 143

## 2024-03-26 LAB — CYCLOSPORINE LEVEL: CYCLOSPORINE (FPIA) BLOOD: 182 ng/mL (ref 100–400)

## 2024-03-26 NOTE — Unmapped (Signed)
 [facility] OCCUPATIONAL THERAPY [facility]  OUTPATIENT OCCUPATIONAL THERAPY  03/24/2024  Note Type: Treatment Note       Patient Name: Gina Hart  Date of Birth:02-15-1976  Diagnosis:   Encounter Diagnosis   Name Primary?    Lymphedema Yes     Referring MD:  Marlee Lynwood Benders, *     Date of Onset of Impairment-06/26/2017  Date OT Care Plan Established or Reviewed-03/03/2024  Date OT Treatment Started-03/03/2024   Visit Count: 3  Plan of Care Effective Date: 03/03/2024 - 04/28/2024    Assessment/Plan:    Assessment  Assessment details:    Pt tolerated treatment methods well at this date. Pt demonstrated minimal edema in BLE that softened with manual lymph drainage. R ankle demonstrates more edema on this date.  R worse than L and on this date. No wounds or s/s of infection.Pt tolerated light compression well. Pt would benefit from continued outpatient OT to address all goals in order to reach highest level of potential.    Pt is a 48 year old female with a medical and treatment dx of lymphedema. Pt currently presents with stage II secondary lymphedema in bilateral lower extremities. Pt does have a complicated medical hx where lymphedema will need to be treated carefully with appropriate assessment of kidney function throughout treatment. Lymphedema will need to be gradual as to not overload working kidney. This will also be taken into consideration with management including settings for pneumatic pump.  Pt demonstrates body system impairments including edema, decreased skin integrity in bilateral LE that has the potential to impact her ADL, IADL functions, ambulation, as well as general health. She would benefit from outpatient occupational therapy to address the above-mentioned impairments to return to PLOF with independent management of lymphedema. Pt would benefit from a pneumatic pump and additional compression for daily use.       Impairments: decreased skin integrity, increased edema and decreased endurance        Prognosis: good prognosis    Personal Factors/Comorbidities: 3+      Examination of Body Systems: activity/participation, lymphatic and integumentary    Clinical Decision Making: complex    Positive Prognosis Rationale: age, motivated for treatment and Pain Status.  Negative Prognosis Rationale: medical status/condition and chronicity of condition.    Clinical Presentation: evolving    Therapy Goals      Goals:       Short Term Goals (4 weeks)  1. Pt's circumference of BLE will be reduced by 1cm at all measurements within 4  weeks.   2. Pt's skin integrity of BLE will improve in 4 weeks to decrease potential for infection.  3. Pt/family will be educated on self MLD and recall it with 100% accuracy to perform in the home in  4 weeks.   4. Pt's skin texture of BLE will improve to a soft, supple state within 4 Weeks.     Long Term Goals (8 weeks)  1. Pt's circumference of BLE will be reduced by 1.5-2cm at all measurements within 8 weeks to increase ADL/IADL function.   2. Pt will recall all precautions to prevent lymphedema exacerbation at 100% in 8 weeks.  3. Pt will be independent with home management of lymphedema in 8 weeks.       Plan    Therapy options: will be seen for skilled occupational therapy services    Planned therapy interventions: 97110-Therapeutic Exercises, 97140-Manual Therapy, 97168-Re-evaluation (OT), 97530-Therapeutic Activities, 97750-Physical Performance Test, 97763-Orthotic and/or prosthetic management/training (subsequent encounter) and 97760-Orthotic Fitting/Training (initial  encounter)    DME Equipment: compression pump and compression garments.    Frequency: 1-2x per week.    Duration in weeks: 8 weeks    Education provided to: patient.    Education provided: HEP, Furniture conservator/restorer, DME education, Lymphedema precautions, Role of therapy in Rehabilitation, Manual lymph drainage, Fall prevention, Treatment options and plan and Symptom management    Education results: needs reinforcement. Communication/Consultation: Medicare Cert/POC sent to Referring Provider.      Total Treatment Time: 55      Treatment rendered today:       Pt was positioned in supine with HOB elevated for comfort. Manual lymph drainage pretreatment was completed to abdomen, R axilla and R lateral trunk. Manual lymph drainage was completed to RLE, L axilla and L lateral trunk. Manual lymph drainage was completed to LLE. No compression placed on this date.         Subjective:     History of Present Condition     Date of onset:  06/26/2017    History of Present Condition/Chief Complaint:  Pt is a 48 year old female with a medical dx of lymphedema. Pt reports that she had a renal transplant in 2018 (R) and that her L kidney is non-functioning. Pt reports that she did not notice swelling in BLE post surgery, specifically R ankle. She reports after kidney transplant she noticed that R foot/ankle started to swell more and would not go down as easily as they previously did. Pt reports that her PCP recommended compression socks and she has gotten 15-77mmHg compression and tolerates them well. Pt is seeking occupational therapy at this time to become independent with management of lymphedema symptoms.     PMHx: Long Covid (2022), hx of scoliosis, s/p repair 1991, Renal transplant 2018, HTN. Pt is being followed by Critical Care Medicine.   Subjective:  Pt reports no pain on this date. After previous session that her shoulders were sore. Therapist assured pt that this was not due to complications from lymphedema or treatment but rather the position she was in and the stiffness of the bed. We will place additional pillows under pt so that she is more comfortable at following sessions.      Quality of life:  Good  Pain:     no pain reported      Pain related Behaviors:  None    Red Flags:  Kidney  Precautions/Equipment   Precautions:  Hypertension    Current Braces/Orthoses:  None    Equipment Currently Used:  None  Prior Functional Status Physical limitation(s):  Pt reports limitations due to long Covid, swelling in BLE s/p kidney transplant  Current Functional Status:    limited household activities and limited standing tolerance  Social Support:     Lives Environment:  One-story house    Lives with:  Adult children    Hand dominance:  Right    Communication Preference:  Verbal, written and visual  Barriers to Learning:  No Barriers    Work/School:  [employer]  Diagnostic Tests:     None    Treatments:     None      Current treatment: occupational therapy    Patient Goals:     Patient/Family goals for therapy:  Decreased edema, improve fit of clothing, improve fit of shoes, improved ambulation, other and obtain compression garment    Other patient goals:  Obtain pneumatic pump      Objective:   Lymphedema:   Treatment:  Lymph node removal: No      Chemotherapy: No      Radiation therapy: No      Previous lymphedema treatment: No      Reoccurrence?: No    Current Management:     Daytime:  Knee high stockings (15-5mmHg)    Nighttime:  Nothing  Contraindications:     Contraindications:  Neck treatment contraindications    Neck treatment contraindications:  Hyper/hypotension  Objective:     Location of swelling:  right, left, foot, knee, calf, toes and ankle    Fold thickness:  Normal    Fibrosis: No      Skin assessment:  Edema, taut and dry    Color:  flesh    Temperature:  Normal    Hair growth:  Normal    Stemmer's sign:  Positive    Wound: No      Pitting: Yes      Numbers; edema:  1+    Additional objective findings:    RLE  MTP: 21.1cm  Heel: 31.6cm  Ankle: 26.9cm  10 cm: 33.1cm  20 cm: 41cm  Knee:  42cm  10cm: 51cm  20cm: 63.8cm     LLE:  MTP: 20.5cm  Heel: 31.7cm  Ankle: 26cm  10 cm: 31.5cm  20 cm: 40cm  Knee:  40cm  10cm: 53cm  20cm: 64cm   Posture/Observations:     Sitting:  Rounded shoulders    Sleeping:  Bed  ROM and Strength:     ROM and Strength findings:  BLE WFL  Strength: BLE 4/5 with MMT  Tests:     Lower Extremity Functional Scale Score (out of 80):  18    LLI Scale:  25    Charges for this date: manual therapy - 55 minutes    I attest that I have reviewed the above information.  Signed: Lauraine JAYSON Custard, OT  03/24/2024 8:14 AM

## 2024-04-07 ENCOUNTER — Encounter: Admit: 2024-04-07 | Discharge: 2024-04-07 | Payer: Medicare (Managed Care)

## 2024-04-07 ENCOUNTER — Ambulatory Visit: Admit: 2024-04-07 | Payer: Medicare (Managed Care)

## 2024-04-07 DIAGNOSIS — Z1239 Encounter for other screening for malignant neoplasm of breast: Principal | ICD-10-CM

## 2024-04-07 DIAGNOSIS — Z1231 Encounter for screening mammogram for malignant neoplasm of breast: Principal | ICD-10-CM

## 2024-04-07 DIAGNOSIS — Z940 Kidney transplant status: Principal | ICD-10-CM

## 2024-04-07 DIAGNOSIS — I890 Lymphedema, not elsewhere classified: Principal | ICD-10-CM

## 2024-04-07 DIAGNOSIS — R7303 Prediabetes: Principal | ICD-10-CM

## 2024-04-07 DIAGNOSIS — Z23 Encounter for immunization: Principal | ICD-10-CM

## 2024-04-07 DIAGNOSIS — L918 Other hypertrophic disorders of the skin: Principal | ICD-10-CM

## 2024-04-07 LAB — HEMOGLOBIN A1C
ESTIMATED AVERAGE GLUCOSE: 114 mg/dL
HEMOGLOBIN A1C: 5.6 % (ref 4.8–5.6)

## 2024-04-07 NOTE — Unmapped (Signed)
 [facility] OCCUPATIONAL THERAPY [facility]  OUTPATIENT OCCUPATIONAL THERAPY  04/07/2024  Note Type: Progress Note       Patient Name: Yuridiana Formanek  Date of Birth:[birth year]  Diagnosis:   Encounter Diagnosis   Name Primary?    Lymphedema Yes     Referring MD:  Marlee Lynwood Benders, *     Date of Onset of Impairment-06/26/2017  Date OT Care Plan Established or Reviewed-03/03/2024  Date OT Treatment Started-03/03/2024   Visit Count: 4  Plan of Care Effective Date: 03/03/2024 - 04/28/2024    Assessment/Plan:    Assessment  Assessment details:    Pt tolerated treatment methods well at this date. Pt demonstrated minimal edema in BLE that softened with manual lymph drainage. R ankle demonstrates less edema on this date.  Good progress on this date. No wounds or s/s of infection.Pt tolerates compression well. Pt would benefit from continued outpatient OT to address all goals in order to reach highest level of potential.    Pt is a 48 year old female with a medical and treatment dx of lymphedema. Pt currently presents with stage II secondary lymphedema in bilateral lower extremities. She has been seen for 4 outpatient occupational therapy treatment sessions with good tolerance for treatment. No adverse reactions to treatment.  Pt does have a complicated medical hx where lymphedema will need to be treated carefully with appropriate assessment of kidney function throughout treatment. Lymphedema will need to be gradual as to not overload working kidney. This will also be taken into consideration with management including settings for pneumatic pump.  Pt demonstrates body system impairments including edema, decreased skin integrity in bilateral LE that has the potential to impact her ADL, IADL functions, ambulation, as well as general health. Please see GOALs for details of progress. She would benefit from outpatient occupational therapy to address the above-mentioned impairments to return to PLOF with independent management of lymphedema. Pt would benefit from a pneumatic pump and additional compression for daily use.       Please note:  The patient is being re-evaluated today for chronic lymphedema after completing a 4-week trial of daily compression, elevation and regular exercise. Their lymphedema has not improved, and pt continues to experience symptoms including hyperpigmentation. A basic [id number] device will not provide satisfactory results due to the patient's unique characteristics of significant pain from fluid accumulation causing nerve pressure and restricted movement. The patient will require an E0652 advanced device with the ability to manually program pressure. Thank you.       Impairments: decreased skin integrity, increased edema and decreased endurance        Prognosis: good prognosis    Personal Factors/Comorbidities: 3+      Examination of Body Systems: activity/participation, lymphatic and integumentary    Clinical Decision Making: complex    Positive Prognosis Rationale: age, motivated for treatment and Pain Status.  Negative Prognosis Rationale: medical status/condition and chronicity of condition.    Clinical Presentation: evolving    Therapy Goals      Goals:       Short Term Goals (4 weeks)  1. Pt's circumference of BLE will be reduced by 1cm at all measurements within 4  weeks.   CURRENT STATUS: Progress made. See measurements for details.   2. Pt's skin integrity of BLE will improve in 4 weeks to decrease potential for infection.  CURRENT STATUS: Progress made. Skin integrity improved with no current evidence of wounds.   3. Pt/family will be educated on self MLD and recall it with  100% accuracy to perform in the home in  4 weeks.   CURRENT STATUS: GOAL MET  4. Pt's skin texture of BLE will improve to a soft, supple state within 4 Weeks.  CURRENT STATUS: Progress made.      Long Term Goals (8 weeks)  1. Pt's circumference of BLE will be reduced by 1.5-2cm at all measurements within 8 weeks to increase ADL/IADL function.   CURRENT STATUS: Progress made. See measurements for details.   2. Pt will recall all precautions to prevent lymphedema exacerbation at 100% in 8 weeks.  CURRENT STATUS: GOAL MET  3. Pt will be independent with home management of lymphedema in 8 weeks.   CURRENT STATUS: Pt does not have all tools to be independent with home management. Continue for mastery.       Plan    Therapy options: will be seen for skilled occupational therapy services    Planned therapy interventions: 97110-Therapeutic Exercises, 97140-Manual Therapy, 97168-Re-evaluation (OT), 97530-Therapeutic Activities, 97750-Physical Performance Test, 97763-Orthotic and/or prosthetic management/training (subsequent encounter) and 97760-Orthotic Fitting/Training (initial encounter)    DME Equipment: compression pump and compression garments.    Frequency: 1-2x per week.    Duration in weeks: 8 weeks    Education provided to: patient.    Education provided: HEP, Furniture conservator/restorer, DME education, Lymphedema precautions, Role of therapy in Rehabilitation, Manual lymph drainage, Fall prevention, Treatment options and plan and Symptom management    Education results: needs reinforcement.    Communication/Consultation: Medicare Cert/POC sent to Referring Provider.      Total Treatment Time: 55      Treatment rendered today:       Pt was positioned in supine with HOB elevated for comfort. Manual lymph drainage pretreatment was completed to abdomen, R axilla and R lateral trunk. Manual lymph drainage was completed to RLE, L axilla and L lateral trunk. Manual lymph drainage was completed to LLE. No compression placed on this date.         Subjective:     History of Present Condition     Date of onset:  06/26/2017    History of Present Condition/Chief Complaint:  Pt is a 48 year old female with a medical dx of lymphedema. Pt reports that she had a renal transplant in 2018 (R) and that her L kidney is non-functioning. Pt reports that she did not notice swelling in BLE post surgery, specifically R ankle. She reports after kidney transplant she noticed that R foot/ankle started to swell more and would not go down as easily as they previously did. Pt reports that her PCP recommended compression socks and she has gotten 15-65mmHg compression and tolerates them well. Pt is seeking occupational therapy at this time to become independent with management of lymphedema symptoms.     PMHx: Long Covid (2022), hx of scoliosis, s/p repair 1991, Renal transplant 2018, HTN. Pt is being followed by Critical Care Medicine.   Subjective:  Pt reports no pain on this date but does report that  Pt reports that she was able to stay off her feet yesterday and notes less edema. Pt has not yet received her compression or pneumatic pump. She is able to complete exercise, self MLD, wear compression, and follow lymphedema risk precautions.      Quality of life:  Good  Pain:     Current pain rating:  0    At best pain rating:  0    At worst pain rating:  5  Location:  BLE  Quality:  Aching    Pain related Behaviors:  None    Red Flags:  Kidney  Precautions/Equipment   Precautions:  Hypertension    Current Braces/Orthoses:  None    Equipment Currently Used:  None  Prior Functional Status     Physical limitation(s):  Pt reports limitations due to long Covid, swelling in BLE s/p kidney transplant  Current Functional Status:    limited household activities and limited standing tolerance  Social Support:     Lives Environment:  One-story house    Lives with:  Adult children    Hand dominance:  Right    Communication Preference:  Verbal, written and visual  Barriers to Learning:  No Barriers    Work/School:  [employer]  Diagnostic Tests:     None    Treatments:     None      Current treatment: occupational therapy    Patient Goals:     Patient/Family goals for therapy:  Decreased edema, improve fit of clothing, improve fit of shoes, improved ambulation, other and obtain compression garment    Other patient goals:  Obtain pneumatic pump      Objective:   Lymphedema:   Treatment:     Lymph node removal: No      Chemotherapy: No      Radiation therapy: No      Previous lymphedema treatment: No      Reoccurrence?: No    Current Management:     Current management details:  Complete decongestive therapy in the outpatient setting.     Daytime:  Knee high stockings, performs exercise, performs self-MLD and wears garment for exercise (15-25mmHg, elevation of BLE)    Nighttime:  Nothing  Contraindications:     Contraindications:  Neck treatment contraindications    Neck treatment contraindications:  Hyper/hypotension  Objective:     Location of swelling:  right, left, foot, knee, calf, toes and ankle    Fold thickness:  Normal    Fibrosis: No      Skin assessment:  Edema, taut and dry    Color:  flesh    Temperature:  Normal    Hair growth:  Normal    Stemmer's sign:  Positive    Wound: No      Pitting: Yes      Numbers; edema:  1+    Additional objective findings:    RLE                04/07/24  MTP: 21.1cm     22.cm  Heel: 31.6cm     30cm    Ankle: 26.9cm   25.4cm  10 cm: 33.1cm  31.5cm  20 cm: 41cm     39.4cm  Knee:  42cm     40cm  10cm: 51cm      50.5cm  20cm: 63.8cm   61cm     LLE:  MTP: 20.5cm    20.8cm  Heel: 31.7cm    30cm  Ankle: 26cm     24.8cm  10 cm: 31.5cm  31cm  20 cm: 40cm     38.7cm  Knee:  40cm     40.8cm  10cm: 53cm      49.9cm  20cm: 64cm      61.7cm  Posture/Observations:     Sitting:  Rounded shoulders    Sleeping:  Bed  ROM and Strength:     ROM and Strength findings:  BLE WFL  Strength: BLE 4/5 with MMT  Tests:     Lower Extremity Functional Scale Score (out of 80):  25    LLI Scale:  22    Charges for this date: manual therapy - 45 minutes, Physical performance test - 10 minutes    I attest that I have reviewed the above information.  Signed: Lauraine JAYSON Custard, OT  04/07/2024 10:04 AM

## 2024-04-07 NOTE — Unmapped (Signed)
 Assessment/Plan:     Assessment & Plan  Skin Tags and Acanthosis Nigricans  The skin lesions she is concerned about are consistent with simple skin tags. Not currently catching on clothing or bothering her at all.   - Given concomitant skin tags and acanthosis, will check an A1c  - Monitor skin tags for changes or irritation. Would be happy to remove them in the future if warranted. No changes for now.     Kidney Transplant  Kidney transplant recipient on cyclosporin immunosuppression.  - Administer high-dose flu vaccine due to transplant history.      Olfactory Dysfunction following COVID-19 (Anosmia) and Long COVID  Persistent anosmia post-COVID-19 infection. Olfactory training discussed with potential improvement rates.  - Recommend olfactory training kit with at least four different scents.  - Instruct to perform olfactory training twice daily for one month.  - If no improvement in one month, consider addition of palmitoylethanolamide with luteolin (PEA-LUT)      Assessment & Plan  Encounter for screening for malignant neoplasm of breast, unspecified screening modality    Orders:    Mammo Screening Bilateral; Future    Prediabetes    Orders:    Hemoglobin A1c; Future    Skin tag    Orders:    Hemoglobin A1c; Future    Encounter for screening mammogram for malignant neoplasm of breast    Orders:    Mammo Screening Bilateral; Future    Influenza vaccine needed  Appropriate for high dose flu given history of renal transplant.   Orders:    INFLUENZA IIV3 HIGH DOSE 6YRS+(FLUZONE)    INFLUENZA ADJUVANTED PF, IIV3(31YR UP)(FLUAD)      No follow-ups on file.     Subjective:     Chief Complaint: Follow-up (Follow up  would like to discuss moles on skin areas - mult areas of body )       History of Present Illness  Gina Hart is a 48 year old female with a history of kidney transplant who presents for a mammogram and evaluation of skin changes.    Breast cancer screening  - Uncertain mammogram history, recalls last mammogram in 2017  - No record of a recent mammogram    Cutaneous lesions  - Skin tags present under both breasts for several months, non-tender and asymptomatic  - Additional skin tags present on her side  - No skin tags observed on her back  - Darkened skin present on the posterior neck, patient unaware of this finding  - No family history of skin cancer    Olfactory dysfunction  - Persistent anosmia since COVID-19 infection    Immunosuppression status  - History of kidney transplant  - Currently taking cyclosporin for immunosuppression         Objective:        Physical Exam  Constitutional:       General: She is not in acute distress.  Neck:      Comments: Acanthosis nigricans of the neck. Neck is supple.   Cardiovascular:      Rate and Rhythm: Normal rate and regular rhythm.   Pulmonary:      Effort: Pulmonary effort is normal.   Musculoskeletal:         General: No swelling or deformity.   Skin:     General: Skin is warm and dry.      Comments: There are lesions to the chest just below the breasts consistent with multiple small (<71mm) skin tags   Neurological:  General: No focal deficit present.      Mental Status: She is alert.   Psychiatric:         Mood and Affect: Mood normal.        Visit Vitals  BP 118/78 (BP Site: L Arm, BP Position: Sitting, BP Cuff Size: Large)   Pulse 68   Temp 36.4 ??C (97.5 ??F) (Temporal)   Resp 20   Ht 147.3 cm (4' 9.99)   Wt 94.7 kg (208 lb 12.8 oz)   LMP 04/30/2016   SpO2 98%   BMI 43.65 kg/m??     BP Readings from Last 3 Encounters:   04/07/24 118/78   03/10/24 (P) 121/80   03/05/24 133/83     Wt Readings from Last 3 Encounters:   04/07/24 94.7 kg (208 lb 12.8 oz)   03/10/24 (P) 94.2 kg (207 lb 9.6 oz)   03/05/24 93.9 kg (207 lb)

## 2024-04-07 NOTE — Unmapped (Signed)
 Venipuncture performed.  Drawn from left hand.  Bandage applied.  Pt tolerated well.

## 2024-04-07 NOTE — Unmapped (Signed)
 Ms. Gina Hart,    It is great to see you! We will get you set up for a mammogram at the [facility] next door.  For your lack of smell since COVID, the best treatment is at home olfactory training. Just look up an Financial controller on [employer] and buy one that has at least four scents. The kit should come with instructions for use, but be sure you are using it twice daily for one month. If no better in one month, shoot me a MyChart message and there are some supplements we can try, but with your history of transplant, I want to hold off for now.   The spots under your breasts are skin tags, these are not dangerous or cancerous. Do keep an eye out for anything that is changing. If these start getting caught on clothing or bothering you, come back and we will snip them off. In the meantime, let me know if you get any other funny skin stuff. We will check your A1c because there is an association between skin tags and diabetes.     [name], MD  [facility] at [facility]

## 2024-04-10 DIAGNOSIS — Z940 Kidney transplant status: Principal | ICD-10-CM

## 2024-04-14 DIAGNOSIS — I890 Lymphedema, not elsewhere classified: Principal | ICD-10-CM

## 2024-04-14 NOTE — Unmapped (Signed)
 [facility] OCCUPATIONAL THERAPY [place]  OUTPATIENT OCCUPATIONAL THERAPY  04/14/2024  Note Type: Treatment Note       Patient Name: Gina Hart  Date of Birth:Apr 23, 1976  Diagnosis:   Encounter Diagnosis   Name Primary?    Lymphedema Yes     Referring MD:  Marlee Lynwood Benders, *     Date of Onset of Impairment-06/26/2017  Date OT Care Plan Established or Reviewed-03/03/2024  Date OT Treatment Started-03/03/2024   Visit Count: 5  Plan of Care Effective Date: 03/03/2024 - 04/28/2024    Assessment/Plan:    Assessment  Assessment details:    Pt tolerated treatment methods well at this date. Pt demonstrated minimal edema in BLE that softened with manual lymph drainage. B ankles are more swollen on this date.   No wounds or s/s of infection.Pt tolerates compression well. Pt would benefit from continued outpatient OT to address all goals in order to reach highest level of potential.    Pt is a 48 year old female with a medical and treatment dx of lymphedema. Pt currently presents with stage II secondary lymphedema in bilateral lower extremities. She has been seen for 4 outpatient occupational therapy treatment sessions with good tolerance for treatment. No adverse reactions to treatment.  Pt does have a complicated medical hx where lymphedema will need to be treated carefully with appropriate assessment of kidney function throughout treatment. Lymphedema will need to be gradual as to not overload working kidney. This will also be taken into consideration with management including settings for pneumatic pump.  Pt demonstrates body system impairments including edema, decreased skin integrity in bilateral LE that has the potential to impact her ADL, IADL functions, ambulation, as well as general health. Please see GOALs for details of progress. She would benefit from outpatient occupational therapy to address the above-mentioned impairments to return to PLOF with independent management of lymphedema. Pt would benefit from a pneumatic pump and additional compression for daily use.       Please note:  The patient is being re-evaluated today for chronic lymphedema after completing a 4-week trial of daily compression, elevation and regular exercise. Their lymphedema has not improved, and pt continues to experience symptoms including hyperpigmentation. A basic [id number] device will not provide satisfactory results due to the patient's unique characteristics of significant pain from fluid accumulation causing nerve pressure and restricted movement. The patient will require an E0652 advanced device with the ability to manually program pressure. Thank you.       Impairments: decreased skin integrity, increased edema and decreased endurance        Prognosis: good prognosis    Personal Factors/Comorbidities: 3+      Examination of Body Systems: activity/participation, lymphatic and integumentary    Clinical Decision Making: complex    Positive Prognosis Rationale: age, motivated for treatment and Pain Status.  Negative Prognosis Rationale: medical status/condition and chronicity of condition.    Clinical Presentation: evolving    Therapy Goals      Goals:       Short Term Goals (4 weeks)  1. Pt's circumference of BLE will be reduced by 1cm at all measurements within 4  weeks.   CURRENT STATUS: Progress made. See measurements for details.   2. Pt's skin integrity of BLE will improve in 4 weeks to decrease potential for infection.  CURRENT STATUS: Progress made. Skin integrity improved with no current evidence of wounds.   3. Pt/family will be educated on self MLD and recall it with 100% accuracy to perform  in the home in  4 weeks.   CURRENT STATUS: GOAL MET  4. Pt's skin texture of BLE will improve to a soft, supple state within 4 Weeks.  CURRENT STATUS: Progress made.      Long Term Goals (8 weeks)  1. Pt's circumference of BLE will be reduced by 1.5-2cm at all measurements within 8 weeks to increase ADL/IADL function.   CURRENT STATUS: Progress made. See measurements for details.   2. Pt will recall all precautions to prevent lymphedema exacerbation at 100% in 8 weeks.  CURRENT STATUS: GOAL MET  3. Pt will be independent with home management of lymphedema in 8 weeks.   CURRENT STATUS: Pt does not have all tools to be independent with home management. Continue for mastery.       Plan    Therapy options: will be seen for skilled occupational therapy services    Planned therapy interventions: 97110-Therapeutic Exercises, 97140-Manual Therapy, 97168-Re-evaluation (OT), 97530-Therapeutic Activities, 97750-Physical Performance Test, 97763-Orthotic and/or prosthetic management/training (subsequent encounter) and 97760-Orthotic Fitting/Training (initial encounter)    DME Equipment: compression pump and compression garments.    Frequency: 1-2x per week.    Duration in weeks: 8 weeks    Education provided to: patient.    Education provided: HEP, Furniture conservator/restorer, DME education, Lymphedema precautions, Role of therapy in Rehabilitation, Manual lymph drainage, Fall prevention, Treatment options and plan and Symptom management    Education results: needs reinforcement.    Communication/Consultation: Medicare Cert/POC sent to Referring Provider.      Total Treatment Time: 55      Treatment rendered today:       Pt was positioned in supine with HOB elevated for comfort. Manual lymph drainage pretreatment was completed to abdomen, R axilla and R lateral trunk. Manual lymph drainage was completed to RLE, L axilla and L lateral trunk. Manual lymph drainage was completed to LLE. Compression removed for therapy and replaced at conclusion of session.         Subjective:     History of Present Condition     Date of onset:  06/26/2017    History of Present Condition/Chief Complaint:  Pt is a 48 year old female with a medical dx of lymphedema. Pt reports that she had a renal transplant in 2018 (R) and that her L kidney is non-functioning. Pt reports that she did not notice swelling in BLE post surgery, specifically R ankle. She reports after kidney transplant she noticed that R foot/ankle started to swell more and would not go down as easily as they previously did. Pt reports that her PCP recommended compression socks and she has gotten 15-25mmHg compression and tolerates them well. Pt is seeking occupational therapy at this time to become independent with management of lymphedema symptoms.     PMHx: Long Covid (2022), hx of scoliosis, s/p repair 1991, Renal transplant 2018, HTN. Pt is being followed by Critical Care Medicine.   Subjective:  Pt reports no pain on this date. She reports that she was not able to keep her legs up as much this weekend and thus they are swollen a little more.      Quality of life:  Good  Pain:     Current pain rating:  0    At best pain rating:  0    At worst pain rating:  5  Location:  BLE    Quality:  Aching    Pain related Behaviors:  None    Red Flags:  Kidney  Precautions/Equipment   Precautions:  Hypertension    Current Braces/Orthoses:  None    Equipment Currently Used:  None  Prior Functional Status     Physical limitation(s):  Pt reports limitations due to long Covid, swelling in BLE s/p kidney transplant  Current Functional Status:    limited household activities and limited standing tolerance  Social Support:     Lives Environment:  One-story house    Lives with:  Adult children    Hand dominance:  Right    Communication Preference:  Verbal, written and visual  Barriers to Learning:  No Barriers    Work/School:  [employer]  Diagnostic Tests:     None    Treatments:     None      Current treatment: occupational therapy    Patient Goals:     Patient/Family goals for therapy:  Decreased edema, improve fit of clothing, improve fit of shoes, improved ambulation, other and obtain compression garment    Other patient goals:  Obtain pneumatic pump      Objective:   Lymphedema:   Treatment:     Lymph node removal: No      Chemotherapy: No      Radiation therapy: No      Previous lymphedema treatment: No      Reoccurrence?: No    Current Management:     Current management details:  Complete decongestive therapy in the outpatient setting.     Daytime:  Knee high stockings, performs exercise, performs self-MLD and wears garment for exercise (15-52mmHg, elevation of BLE)    Nighttime:  Nothing  Contraindications:     Contraindications:  Neck treatment contraindications    Neck treatment contraindications:  Hyper/hypotension  Objective:     Location of swelling:  right, left, foot, knee, calf, toes and ankle    Fold thickness:  Normal    Fibrosis: No      Skin assessment:  Edema, taut and dry    Color:  flesh    Temperature:  Normal    Hair growth:  Normal    Stemmer's sign:  Positive    Wound: No      Pitting: Yes      Numbers; edema:  1+    Additional objective findings:    RLE                04/07/24  MTP: 21.1cm     22.cm  Heel: 31.6cm     30cm    Ankle: 26.9cm   25.4cm  10 cm: 33.1cm  31.5cm  20 cm: 41cm     39.4cm  Knee:  42cm     40cm  10cm: 51cm      50.5cm  20cm: 63.8cm   61cm     LLE:  MTP: 20.5cm    20.8cm  Heel: 31.7cm    30cm  Ankle: 26cm     24.8cm  10 cm: 31.5cm  31cm  20 cm: 40cm     38.7cm  Knee:  40cm     40.8cm  10cm: 53cm      49.9cm  20cm: 64cm      61.7cm  Posture/Observations:     Sitting:  Rounded shoulders    Sleeping:  Bed  ROM and Strength:     ROM and Strength findings:  BLE WFL  Strength: BLE 4/5 with MMT  Tests:     Lower Extremity Functional Scale Score (out of 80):  25    LLI  Scale:  22    Charges for this date: manual therapy - 55 minutes    I attest that I have reviewed the above information.  Signed: Lauraine JAYSON Custard, OT  04/14/2024 12:42 PM

## 2024-04-17 DIAGNOSIS — Z940 Kidney transplant status: Principal | ICD-10-CM

## 2024-04-17 NOTE — Progress Notes (Signed)
 [facility] Clinical Assessment & Refill Coordination Note    Gina Hart, DOB: 11/18/1975  Phone: [phone] (home)     All above HIPAA information was verified with patient.     Was a Nurse, learning disability used for this call? No    Specialty Medication(s):   Transplant: cyclosporine  25mg      Current Medications[1]     Changes to medications: Gina Hart Reports stopping the following medications: albuterol , pulmicort , monurol , nexium  - message sent to clinic team to dc meds in epic med list. Also pt verified never started wegovy , mesaged clinic to dc in med list and rx at [facility]    Medication list has been reviewed and updated in Epic: Yes  3 high priority alerts noted in med rec today:  Ibuprofen allergy with aspirin  - pt says ok with aspirin   Pulmicort  and symbicort  duplication (not from [facility]) - pt not on pulmicort , messaged clinic to dc on med list  Interaction meclizine and potassium (neither from [facility]) - clinic notified    Allergies[2]    Changes to allergies: No    Allergies have been reviewed and updated in Epic: Yes    SPECIALTY MEDICATION ADHERENCE     Cyclosporine  25mg   : 25 days of medicine on hand       Medication Adherence    Patient reported X missed doses in the last month: 0  Specialty Medication: cyclosporine  25mg   Patient is on additional specialty medications: No          Specialty medication(s) dose(s) confirmed: Regimen is correct and unchanged.     Are there any concerns with adherence? No    Adherence counseling provided? Not needed    CLINICAL MANAGEMENT AND INTERVENTION      Clinical Benefit Assessment:    Do you feel the medicine is effective or helping your condition? Yes    Clinical Benefit counseling provided? Not needed    Adverse Effects Assessment:    Are you experiencing any side effects? No    Are you experiencing difficulty administering your medicine? No    Quality of Life Assessment:    Quality of Life    Rheumatology  Oncology  Dermatology  Cystic Fibrosis How many days over the past month did your transplant  keep you from your normal activities? For example, brushing your teeth or getting up in the morning. 0    Have you discussed this with your provider? Not needed    Acute Infection Status:    Acute infections noted within Epic:  No active infections    Patient reported infection: None    Therapy Appropriateness:    Is the medication and dose appropriate considering the patient???s diagnosis, treatment, and disease journey, comorbidities, medical history, current medications, allergies, therapeutic goals, self-administration ability, and access barriers? Yes, therapy is appropriate and should be continued     Clinical Intervention:    Was an intervention completed as part of this clinical assessment? No    DISEASE/MEDICATION-SPECIFIC INFORMATION      N/A    Solid Organ Transplant: Not Applicable    PATIENT SPECIFIC NEEDS     Does the patient have any physical, cognitive, or cultural barriers? No    Is the patient high risk? No    Does the patient require physician intervention or other additional services (i.e., nutrition, smoking cessation, social work)? No    Does the patient have an additional or emergency contact listed in their chart? Yes    SOCIAL DETERMINANTS OF  HEALTH     At the [facility], we have learned that life circumstances - like trouble affording food, housing, utilities, or transportation can affect the health of many of our patients.   That is why we wanted to ask: are you currently experiencing any life circumstances that are negatively impacting your health and/or quality of life? Patient declined to answer    Social Drivers of Health     Food Insecurity: No Food Insecurity (04/07/2024)    Hunger Vital Sign     Worried About Running Out of Food in the Last Year: Never true     Ran Out of Food in the Last Year: Never true   Tobacco Use: Low Risk  (04/14/2024)    Patient History     Smoking Tobacco Use: Never     Smokeless Tobacco Use: Never     Passive Exposure: Not on file   Transportation Needs: No Transportation Needs (04/07/2024)    PRAPARE - Transportation     Lack of Transportation (Medical): No     Lack of Transportation (Non-Medical): No   Alcohol Use: Not At Risk (02/15/2024)    Alcohol Use     How often do you have a drink containing alcohol?: Never     How many drinks containing alcohol do you have on a typical day when you are drinking?: 1 - 2     How often do you have 5 or more drinks on one occasion?: Never   Housing: Low Risk  (04/07/2024)    Housing     Within the past 12 months, have you ever stayed: outside, in a car, in a tent, in an overnight shelter, or temporarily in someone else's home (i.e. couch-surfing)?: No     Are you worried about losing your housing?: No   Physical Activity: Insufficiently Active (02/15/2024)    Exercise Vital Sign     Days of Exercise per Week: 2 days     Minutes of Exercise per Session: 30 min   Utilities: Low Risk  (04/07/2024)    Utilities     Within the past 12 months, have you been unable to get utilities (heat, electricity) when it was really needed?: No   Stress: No Stress Concern Present (02/15/2024)    [employer] of Occupational Health - Occupational Stress Questionnaire     Feeling of Stress: Only a little   Interpersonal Safety: Not At Risk (04/07/2024)    Interpersonal Safety     Unsafe Where You Currently Live: No     Physically Hurt by Anyone: No     Abused by Anyone: No   Substance Use: Low Risk  (02/15/2024)    Substance Use     In the past year, how often have you used prescription drugs for non-medical reasons?: Never     In the past year, how often have you used illegal drugs?: Never     In the past year, have you used any substance for non-medical reasons?: No   Intimate Partner Violence: Not At Risk (02/15/2024)    Humiliation, Afraid, Rape, and Kick questionnaire     Fear of Current or Ex-Partner: No     Emotionally Abused: No     Physically Abused: No     Sexually Abused: No Social Connections: Socially Isolated (02/15/2024)    Social Connection and Isolation Panel     Frequency of Communication with Friends and Family: More than three times a week     Frequency of  Social Gatherings with Friends and Family: Three times a week     Attends Religious Services: Never     Active Member of Clubs or Organizations: No     Attends Engineer, structural: Never     Marital Status: Never married   Programmer, applications: Not on file   Health Literacy: Low Risk  (02/15/2024)    Health Literacy     : Never   Internet Connectivity: Not on file       Would you be willing to receive help with any of the needs that you have identified today? Not applicable       SHIPPING     Specialty Medication(s) to be Shipped:   N/a    Other medication(s) to be shipped: No additional medications requested for fill at this time    Specialty Medications not needed at this time: Transplant: cyclosporine  25mg      Changes to insurance: No    Cost and Payment: n/a    Delivery Scheduled: Patient declined refill at this time due to supply on hand, patient is aware next call set up for 2 weeks out but to contact [facility] with any needs sooner.     Medication will be delivered via n/a to the confirmed n/a address in [facility].    The patient will receive a drug information handout for each medication shipped and additional FDA Medication Guides as required.  Verified that patient has previously received a Conservation officer, historic buildings and a Surveyor, mining.    The patient or caregiver noted above participated in the development of this care plan and knows that they can request review of or adjustments to the care plan at any time.      All of the patient's questions and concerns have been addressed.    Ronal FORBES Reusing, PharmD   [facility] Specialty Pharmacist       [1]   Current Outpatient Medications   Medication Sig Dispense Refill    acetaminophen  (TYLENOL ) 325 MG tablet Take 1-2 tablets (325-650 mg total) by mouth every four (4) hours as needed for pain. 100 tablet 0    albuterol  2.5 mg /3 mL (0.083 %) nebulizer solution Inhale 3 mL (2.5 mg total) by nebulization continuous as needed.      albuterol  HFA 90 mcg/actuation inhaler INHALE TWO PUFFS BY MOUTH EVERY 6 HOURS AS NEEDED FOR WHEEZING (Patient not taking: Reported on 04/07/2024) 8.5 g 0    amlodipine  (NORVASC ) 2.5 MG tablet Take 3 tablets (7.5 mg total) by mouth daily. 90 tablet 11    aspirin  (ECOTRIN) 81 MG tablet Take 1 tablet (81 mg total) by mouth daily.      biotin  5 mg cap Take 1 capsule (5,000 mcg total) by mouth daily. 30 capsule 11    budesonide  (PULMICORT ) 0.5 mg/2 mL nebulizer solution 2 mL (0.5 mg total) by Other route two (2) times a day. Add one respule (0.5mg /50mL) to your morning sinus rinse bottle and shake bottle, then irrigate each nostril. Do the same with an additional respule in the evening. (Patient not taking: Reported on 04/07/2024) 120 mL 3    budesonide -formoterol  (SYMBICORT ) 160-4.5 mcg/actuation inhaler 2 puff with spacer bid 10.2 g 11    chlorthalidone  (HYGROTON ) 25 MG tablet Take 1 tablet (25 mg total) by mouth every morning. 90 tablet 3    cholecalciferol , vitamin D3-50 mcg, 2,000 unit,, 50 mcg (2,000 unit) cap Take 1 capsule (50 mcg total)  by mouth in the morning. 30 capsule 11    cycloSPORINE  modified (NEORAL ) 25 MG capsule Take 4 capsules (100 mg total) by mouth two (2) times a day. 240 capsule 11    esomeprazole  (NEXIUM ) 20 MG capsule Take 2 capsules (40 mg total) by mouth daily. For two months, take 40mg  (two tablets) at night. Then decrease dose to 20mg  (one tablet) for one month. (Patient not taking: Reported on 04/07/2024) 60 capsule 2    ferrous sulfate  325 (65 FE) MG tablet Take 1 tablet (325 mg total) by mouth two (2) times a day. 60 tablet 11    fluticasone  propionate (FLONASE ) 50 mcg/actuation nasal spray 1 spray by Each Nare route daily. 16 g 4    fosfomycin (MONUROL ) 3 gram Pack Take 1 packet by mouth every 3 days for 3 doses. (Patient not taking: Reported on 04/07/2024) 9 g 0    loratadine (CLARITIN) 10 mg tablet Take 1 tablet (10 mg total) by mouth daily as needed for allergies.      magnesium  oxide (MAG-OX) 400 mg (241.3 mg elemental magnesium ) tablet Take 1 tablet (400 mg total) by mouth daily. 30 tablet 11    meclizine (ANTIVERT) 25 mg tablet Take 1 tablet (25 mg total) by mouth as needed.      melatonin 5 mg tablet Take 1 tablet (5 mg total) by mouth nightly as needed. 30 tablet 11    metoPROLOL  tartrate (LOPRESSOR ) 100 MG tablet TAKE ONE TABLET TWICE DAILY 180 tablet 3    nebulizer and compressor Devi COMPRESSOR NEBULIZER      polyethylene glycol (GLYCOLAX ) 17 gram/dose powder Take 17 g by mouth continuous as needed.      potassium chloride  20 MEQ ER tablet Take 2 tablets (40 mEq total) by mouth two (2) times a day. 120 tablet 11    predniSONE  (DELTASONE ) 5 MG tablet TAKE ONE TABLET BY MOUTH ONCE DAILY 30 tablet 11    rosuvastatin  (CRESTOR ) 5 MG tablet Take 1 tablet (5 mg total) by mouth daily. 90 tablet 3    WEGOVY  0.25 MG/0.5 ML SUBCUTANEOUS PEN INJECTOR Inject 0.25 mg under the skin every seven (7) days. (Patient not taking: Reported on 04/17/2024) 2 mL 11     No current facility-administered medications for this visit.   [2]   Allergies  Allergen Reactions    Ibuprofen Swelling     Other reaction(s): SWELLING/EDEMA    Sulfa  (Sulfonamide Antibiotics)

## 2024-04-18 DIAGNOSIS — Z940 Kidney transplant status: Principal | ICD-10-CM

## 2024-04-18 NOTE — Telephone Encounter (Signed)
 I spoke with the patient (or family member) to notify them that their insurance will no longer be in-network as of 06/26/2024. I informed them of their option to switch to Original Medicare or a different Medicare Advantage plan that includes [facility] during the annual enrollment period. I provided them with contact information for Chapter who can assist them with finding another plan that keeps [facility] in network.    I also informed them that if they continue to receive post-transplant care under their existing plan after 06/26/2024 or another out of network plan, they will be required to pay out of pocket for any services and submit claims on their own behalf to their insurance company.    Patient states I heard about it but I don't know what to do. Charter information given with encouragement to call them sooner rather than later.

## 2024-04-22 DIAGNOSIS — Z940 Kidney transplant status: Principal | ICD-10-CM

## 2024-04-22 DIAGNOSIS — D849 Immunodeficiency, unspecified: Principal | ICD-10-CM

## 2024-04-22 DIAGNOSIS — R82998 Other abnormal findings in urine: Principal | ICD-10-CM

## 2024-04-22 DIAGNOSIS — N390 Urinary tract infection, site not specified: Principal | ICD-10-CM

## 2024-04-22 DIAGNOSIS — R799 Abnormal finding of blood chemistry, unspecified: Principal | ICD-10-CM

## 2024-04-25 ENCOUNTER — Ambulatory Visit: Admit: 2024-04-25 | Discharge: 2024-04-25 | Payer: Medicare (Managed Care) | Attending: Nephrology | Primary: Nephrology

## 2024-04-25 ENCOUNTER — Inpatient Hospital Stay: Admit: 2024-04-25 | Discharge: 2024-04-25 | Payer: Medicare (Managed Care)

## 2024-04-25 DIAGNOSIS — Z940 Kidney transplant status: Principal | ICD-10-CM

## 2024-04-25 DIAGNOSIS — R82998 Other abnormal findings in urine: Principal | ICD-10-CM

## 2024-04-25 DIAGNOSIS — D849 Immunodeficiency, unspecified: Principal | ICD-10-CM

## 2024-04-25 LAB — PROTEIN / CREATININE RATIO, URINE
CREATININE, URINE: 42.1 mg/dL
PROTEIN URINE: 12.6 mg/dL
PROTEIN/CREAT RATIO, URINE: 0.299

## 2024-04-25 LAB — URINALYSIS WITH MICROSCOPY WITH CULTURE REFLEX PERFORMABLE
BACTERIA: NONE SEEN /HPF
BILIRUBIN UA: NEGATIVE
BLOOD UA: NEGATIVE
GLUCOSE UA: NEGATIVE
KETONES UA: NEGATIVE
LEUKOCYTE ESTERASE UA: NEGATIVE
NITRITE UA: NEGATIVE
PH UA: 6.5 (ref 5.0–9.0)
PROTEIN UA: NEGATIVE
RBC UA: 1 /HPF (ref <=4)
SPECIFIC GRAVITY UA: 1.01 (ref 1.003–1.030)
SQUAMOUS EPITHELIAL: 1 /HPF (ref 0–5)
UROBILINOGEN UA: <2
WBC UA: 1 /HPF (ref 0–5)

## 2024-04-25 NOTE — Progress Notes (Signed)
 Transplant Coordinator, Clinic Visit   Pt seen today by transplant nephrology for follow up, reviewed medications and symptoms.   Met with pt during clinic visit. Pt doing ok, no concerns. PT will get labs after clinic visit- last took cyclosporine  at 830 last night   Pt with concern of not being able to breathe through her nose- she has seen ENT for this.   Per Dr. Macie-   Pt needs ENT referral  Referral for Gina Hart bariatric surgery  Endoscopy    Assessment  BP: 132/87 today in clinic   Lightheaded: denies  Headache: at times  Hand tremors: denies  Numbness/tingling: denies  Fevers: denies  Chills/sweats: denies  Shortness of breath: yes  Chest pain or pressure: denies  Palpitations: denies  Abdominal pain: denies  Heart burn: denies  Nausea/vomiting: denies  Diarrhea/constipation: denies  UTI symptoms: denies  Swelling: denies    Good appetite; reports adequate hydration.     Any new medications? denies  Immunosuppressant last taken: last night at 2030    Functional Score: 100   Normal no complaints; no evidence of  disease.     I spent a total of 10 minutes with Gina Hart reviewing medications and symptoms.

## 2024-04-25 NOTE — Progress Notes (Addendum)
 Transplant Nephrology Clinic Visit       Assessment and Plan  Gina Hart is a 48 y.o. female who is s/p deceased donor kidney transplant on 01/11/17. She is seen today for follow up of her kidney transplant, immunosuppression and associated medical problems.     Status post deceased donor kidney transplant 01/11/17  - No history of rejection or evidence of FSGS recurrence.   - Serum creatinine is stable at 1.1 mg/dL (baseline 9.2-8.7 mg/dL).   - UP/C has been historically normal and is 0.299 today  - UA today is unremarkable.     - DSA screens have been negative (most recent 10/12/23)    Immunosuppression management.   - Cyclosporine  level today is not a trough level. Last trough was 182 on 03/19/24 (target trough 125-225)  - Will continue cyclosporine  125 mg BID and prednisone  5 mg daily.   - Myfortic  has been held due to EBV viremia.      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   - PET CT 06/25/19 done at [facility] revealed symmetric hypermetabolic activity in the lingual tonsils without evidence of lymphadenopathy elsewhere. ENT evaluation on 07/09/19 revealed enlarged tonsils, enlarged turbinates, and congested nasal mucosa. Repeat PET CT at [facility] on 07/09/19 revealed only slightly increased uptake in the lingual tonsils without evidence of malignancy. PET CT 09/19/22 without evidence of PTLD. ENT follow up was most recently on 09/28/23.  - EBV viral load 899 on 08/03/22  - Needs EBV viral load with next lab draw    Hypertension.    - Home BP 120-140 systolic. Clinic BP today 132/81  - Edema persists on amlodipine    - Will continue amlodipine  2.5 mg, chlorthalidone  25 mg daily, metoprolol  tartrate 100 mg bid     History of UTI   - Most recent UTI 09/17/23 with 3 organism  - Symptoms of urinary frequency on 10/08/23 led to initiation of Levaquin  on 10/08/23. She tolerated this without tendinitis.   - CTAP without contrast without evidence for possible fistula to bladder   - Denies current UTI symptoms, UA not suggestive of UTI    History of COVID-19, onset 08/07/20, diagnosed 08/11/20, with long-COVID symptoms   - Had received 2 doses of COVID vaccine prior to illness  - Symptoms included fatigue, reduced appetite, loss of sense of smell, fever, cough, shortness of breath  - No O2 requirement  - Sotrovimab  administered 08/12/20   - Though most symptoms resolved she has experienced insomnia, dyspnea on exertion, loss of smell, fatigue, brain fog, and occasional vertigo subsequetly  - Not interested in COVID vaccine    Back pain, neck pain, left leg pain with history of spinal fusion and cervical spinal stenosis   - She is s/p spinal fusion of thoracic spine through L2-3  - No longer on gabapentin  due to side effects    Hyperlipidemia management  - Continue rosuvastatin  5 mg daily    Immunizations/Infection Prevention  Flu vaccine 04/07/24  Prevnar-20 08/04/21  COVID-19 vaccine booster recommended, patient declines    Obesity  - Patient was unable to fill [employer]  due to cost/lack of insurance coverage  - BMI now 43.27 kg/m2  - Referral to [facility] clinic made today     Memory Loss/Confusion.  - Brain MRI 07/06/18 and 06/15/20 was normal.   - Symptoms initially improved after  switching to cyclosporine  from tacrolimus , but appear now to be persistent.  - Symptoms worsened after COVID infection  - Sleep study 12/17/19 revealed no sleep disordered breathing with mild to moderate periodic limb movement.  - Will continue melatonin for insomnia     Chronic cough, DOE, nasal congestion, loss of smell  - Chest CT 11/03/23 without lung disease  - Has deviated septum  - Patient requests another ENT referral      Cholesterol Plaque on Ophthalmology Exam  - Patient reported this finding was made at an ophtho appointment.   - She may need carotid dopplers or other imaging of cerebrovascular system. This was not addressed at her Neuro appointment  - Continue aspirin  and atorvastatin .     Iron  Deficiency without anemia  - Will start iron  supplement  - Schedule colonoscopy and EGD    Cancer screening  - Will follow up with PCP.   - Needs PAP smear. 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 female who is s/p deceased kidney transplant on 2017/02/06. She has experienced no episodes of rejection with normal urinary protein and baseline creatinine <1.2 mg/dL.     She presents today is feeling well. She continues to endorse loss of sense of smell, dyspnea on exertion, and cough. She denies any fatigue, loss of appetite, chest pain, fever, or chills. Urine culture on 09/17/23 grew pseudomonas, E.coli and enterococcus all sensitive to levofloxacin  that she started on 10/08/23.     Home BP has been 120-140 systolic. Short term memory problems and brain fog are persistent.      Immunosuppressive medication doses are unchanged (cyclosporine  125 mg bid, prednisone  5 mg daily). She has held Myfortic  due to the EBV viremia history.     Transplant History:  1. 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  2. S/P kidney transplant at [facility] January 28, 2107. Deceased donor with KDPI 19%. CMV D+/R-; EBV D+/R+  3. Immunosuppression = campath induction followed by tacrolimus  and myfortic  maintenance therapy, steroid free regimen.   4. Tacrolimus  changed to cyclosporine  07/31/2017 due to neurotoxicity with memory deficits and headaches as primary manifestation.  5. RSV infection 09/10/18  6. EBV viremia 05/2020 (VL 9010) with increased tonsillar uptake on PET CT scans 06/24/20 and 07/11/19. No evidence of PTLD.   7. COVID-19 infection 07/2020    Past Medical History:  1. ESRD secondary to FSGS.   2. Hypertension  3. Scoliosis with history of spinal fusion thoracic spine to L2-3  4. COVID-19 07/2020  5. S/P kidney transplant as stated above    Review of Systems    All other systems are reviewed and are negative.    Medications  Current Outpatient Medications   Medication Sig Dispense Refill    acetaminophen  (TYLENOL ) 325 MG tablet Take 1-2 tablets (325-650 mg total) by mouth every four (4) hours as needed for pain. 100 tablet 0    albuterol  HFA 90 mcg/actuation inhaler INHALE TWO PUFFS BY MOUTH EVERY 6 HOURS AS NEEDED FOR WHEEZING (Patient  not taking: Reported on 04/07/2024) 8.5 g 0    amlodipine  (NORVASC ) 2.5 MG tablet Take 3 tablets (7.5 mg total) by mouth daily. 90 tablet 11    aspirin  (ECOTRIN) 81 MG tablet Take 1 tablet (81 mg total) by mouth daily.      biotin  5 mg cap Take 1 capsule (5,000 mcg total) by mouth daily. 30 capsule 11    budesonide -formoterol  (SYMBICORT ) 160-4.5 mcg/actuation inhaler 2 puff with spacer bid 10.2 g 11    chlorthalidone  (HYGROTON ) 25 MG tablet Take 1 tablet (25 mg total) by mouth every morning. 90 tablet 3    cholecalciferol , vitamin D3-50 mcg, 2,000 unit,, 50 mcg (2,000 unit) cap Take 1 capsule (50 mcg total) by mouth in the morning. 30 capsule 11    cycloSPORINE  modified (NEORAL ) 25 MG capsule Take 4 capsules (100 mg total) by mouth two (2) times a day. 240 capsule 11    ferrous sulfate  325 (65 FE) MG tablet Take 1 tablet (325 mg total) by mouth two (2) times a day. 60 tablet 11    fluticasone  propionate (FLONASE ) 50 mcg/actuation nasal spray 1 spray by Each Nare route daily. 16 g 4    loratadine (CLARITIN) 10 mg tablet Take 1 tablet (10 mg total) by mouth daily as needed for allergies.      magnesium  oxide (MAG-OX) 400 mg (241.3 mg elemental magnesium ) tablet Take 1 tablet (400 mg total) by mouth daily. 30 tablet 11    meclizine (ANTIVERT) 25 mg tablet Take 1 tablet (25 mg total) by mouth as needed.      melatonin 5 mg tablet Take 1 tablet (5 mg total) by mouth nightly as needed. 30 tablet 11    metoPROLOL  tartrate (LOPRESSOR ) 100 MG tablet TAKE ONE TABLET TWICE DAILY 180 tablet 3    nebulizer and compressor Devi COMPRESSOR NEBULIZER      polyethylene glycol (GLYCOLAX ) 17 gram/dose powder Take 17 g by mouth continuous as needed.      potassium chloride  20 MEQ ER tablet Take 2 tablets (40 mEq total) by mouth two (2) times a day. 120 tablet 11    predniSONE  (DELTASONE ) 5 MG tablet TAKE ONE TABLET BY MOUTH ONCE DAILY 30 tablet 11    rosuvastatin  (CRESTOR ) 5 MG tablet Take 1 tablet (5 mg total) by mouth daily. 90 tablet 3     No current facility-administered medications for this visit.       Physical Exam  BP 132/81 (BP Site: L Arm, BP Position: Sitting, BP Cuff Size: Large)  - Pulse 65  - Temp 36.7 ??C (98 ??F) (Temporal)  - Wt 93.9 kg (207 lb)  - LMP 04/30/2016  - BMI 43.27 kg/m??   General: Patient is a pleasant female in no apparent distress.  Eyes: Sclera anicteric.  Neck: Supple without LAD/JVD/bruits.  Lungs: Clear to auscultation bilaterally, no wheezes/rales/rhonchi.  Cardiovascular: Regular rate and rhythm without murmurs, rubs or gallops.  Abdomen: Soft, notender/nondistended. Positive bowel sounds. No hepatosplenomegaly, masses or bruits appreciated.  Extremities: Without edema, joints without evidence of synovitis  Skin: Without rash  Neurological: Grossly nonfocal.  Psychiatric: Mood and affect appropriate.     Laboratory Results and Imaging Studies Reviewed in EMR

## 2024-04-27 DIAGNOSIS — Z940 Kidney transplant status: Principal | ICD-10-CM

## 2024-04-28 DIAGNOSIS — Z940 Kidney transplant status: Principal | ICD-10-CM

## 2024-04-28 NOTE — Telephone Encounter (Signed)
 Disregard previous weight mgmt financial information note. Made in error

## 2024-04-28 NOTE — Telephone Encounter (Signed)
 Copied from CRM #[id number]. Topic: Other - Other  >> Apr 28, 2024  2:37 PM Gina Hart wrote:  Per pt she has sinus symptoms declined apt w/ Dr Jeanetta  Let me know if ok to schedule w/ Dr Marcelina ?  Pt requesting another ent Dr.

## 2024-04-28 NOTE — Telephone Encounter (Signed)
 Weight Management Services Financial Information Form    Hospital NPI: [id number] / Physicians NPI: [id number]   [employer] NPI: [id number]   [place] NPI: [id number]  [place] NPI: [id number]    MRN: [id number]  Patient: [employer]: [employer] Plan: [employer]  Telephone: [phone]  Policy/Subscriber ID: [id number]     Subscriber Information (if different from patient)  Name: n/a  Date of Birth:  n/a       Is insurance in-network: No.    Is Bariatric Surgery Covered: Yes  Covered Surgeries:   43644: Laparoscopic roux-en-y gastric bypass   43775: Laparoscopic gastric sleeve     **Completion of this form does not guarantee coverage and is subject to change. The patient is instructed at the time of scheduling that they need to call their insurance company directly to confirm bariatric surgery coverage benefits. If the patient has questions about coinsurance, deductibles or out-of-pocket expenses they should call their insurance carrier or the financial counselor for upfront surgery cost questions at [phone]**    What requirements must be met for surgery to be approved:  Patient must have referral on file within 6 months of surgery for approval (Follows Medicare Guidelines)    Physician Supervised Diet? Yes How Long? 6 months  Does it have to be consecutive? Yes  Does it have to be done by PCP or can it be by a dietitian?  Either (RD if physician supervised)  How recent does it have to be? (past , , etc.) within 1 year  Do I need a weight history? (36yr, 32yrs, 16yrs) Yes  Do I need a clearance/referral letter from a doctor? Yes   If yes, does it have to come from PCP? Yes  Are nutrition consultations a covered benefit? Only as listed below:  [id number]: 15, 30, 45, 60 min preventive : only covered for diabetics or kidney disease  97802: Initial 1:1 nutrition counseling : only covered for diabetics or kidney disease  [phone]: Follow up 1:1 nutrition counseling : only covered for diabetics or kidney disease  97804: Group MNT nutrition counseling : only covered for diabetics or kidney disease   [id number]: IBT Individual : only covered for diabetics or kidney disease  G0473: IBT Group : only covered for diabetics or kidney disease

## 2024-04-28 NOTE — Telephone Encounter (Signed)
 Pt called and advised to reach out to referral team to see if referral can be forwarded to a closer location , patient advised to call back to the office if this can't be done and RN will discuss with provider if she needs an appt to be seen and evaluated by this  office provider for referral need

## 2024-04-28 NOTE — Telephone Encounter (Signed)
 Copied from CRM [id number]. Topic: Access To Clinicians - Req Clinic Call Back  >> Apr 28, 2024 11:52 AM Jon Sharps wrote:  Reason of the Call: referral to ENT    Requesting: call back    Supporting details:pt states she has a referral from her kidney doctor for ENT but it's in [facility] is there any way she can get a referral closer to her home    Appointment: n/a      Does the caller want to be contacted regarding this request? Yes. Please contact The patient by Cell Phone Telephone Information:  Mobile          [phone]      Routine callback turnaround time: 24-48 business hours. (Caller Notified) call once referral is completed.

## 2024-04-30 DIAGNOSIS — Z1211 Encounter for screening for malignant neoplasm of colon: Principal | ICD-10-CM

## 2024-04-30 DIAGNOSIS — Z940 Kidney transplant status: Principal | ICD-10-CM

## 2024-04-30 DIAGNOSIS — R0683 Snoring: Principal | ICD-10-CM

## 2024-04-30 MED ORDER — ROSUVASTATIN 5 MG TABLET
ORAL_TABLET | Freq: Every day | ORAL | 3 refills | 90.00000 days | Status: CP
Start: 2024-04-30 — End: ?

## 2024-04-30 NOTE — Progress Notes (Signed)
 Assessment/Plan:     Assessment & Plan  Sore throat and white coating on tongue  Likely viral etiology. No scrapable plaque on tongue to suggest thrush or candidal esophagitis. Though she is at increased risk here given her immunocompromised status s/p solid organ transplant. No antifungal treatment warranted at this time, but would start fluconazole  if symptoms worsen.   - Advised to drink plenty of fluids and monitor symptoms.  - Instructed to call if symptoms worsen or do not improve by Friday.  - Advised to report any fever, chills, or systemic symptoms immediately.  - Plaque on tongue could be related to dry mouth/snoring, see #2    Snoring and sleep-disordered breathing  Symptoms suggestive of possible sleep-disordered breathing.  - Ordered home sleep study.  Snore- Yes.  Daytime tiredness- Yes.  Observed apneas- Yes.  On treatment for BP-Yes.  BMI >35- Yes.  Age >50-No.  Neck >40cm-Yes.43cm  Female Gender- No.      Encounter for screening for malignant neoplasm of colon  Discussed benefits of colonoscopy for detection and prevention of colon cancer. She prefers cologuard.  - Ordered Cologuard test.     Assessment & Plan      No follow-ups on file.     Subjective:     Chief Complaint: Sore Throat (States that she started having a sore throat and possible thrush on tongue.)       History of Present Illness  Gina Hart is a 48 year old female who presents with a sore throat and white coating on her tongue.    Pharyngitis and oral findings  - Sore throat for four days, most severe in the morning and at night, improving throughout the day  - White coating present on tongue, no white plaque on buccal mucosa  - No fever, cough, or rhinorrhea  - Able to eat and drink despite discomfort    Xerostomia  - Dry mouth attributed to medication use    Sleep-related symptoms  - Snoring present  - Two prior sleep studies, most recent in 2021           Objective:     Physical Exam  HEENT: Throat normal, no oral thrush Physical Exam  HENT:      Mouth/Throat:      Comments: Tongue with plaque, but not the scrapable white plaque typical of candidiasis. Oropharynx is clear, without erythema or exudate.   Neck:      Comments: 43cm neck  Cardiovascular:      Rate and Rhythm: Normal rate and regular rhythm.   Pulmonary:      Effort: Pulmonary effort is normal. No respiratory distress.   Lymphadenopathy:      Cervical: No cervical adenopathy.   Psychiatric:         Mood and Affect: Mood normal.        Visit Vitals  LMP 04/30/2016     BP Readings from Last 3 Encounters:   04/25/24 132/81   04/07/24 118/78   03/10/24 (P) 121/80     Wt Readings from Last 3 Encounters:   04/25/24 93.9 kg (207 lb)   04/07/24 94.7 kg (208 lb 12.8 oz)   03/10/24 (P) 94.2 kg (207 lb 9.6 oz)

## 2024-04-30 NOTE — Telephone Encounter (Signed)
 [facility]erral placed per patient's request

## 2024-04-30 NOTE — Telephone Encounter (Addendum)
 Patient calling, has colonoscopy on 05/15/2024 with Dr Maranda.  She wants to cancel and will call back to reschedule.      Surgery scheduler notified.

## 2024-04-30 NOTE — Patient Instructions (Addendum)
 Gina Hart,    I do not see any thrush on your exam. Your throat also looks just fine. The most likely cause here is a virus that will run its course.  I am not going to do any sort of treatment RIGHT now. HOWEVER, if you are worse at all over the next few days or are no better by Friday, let me know. Obviously any fever, chills, systemic symptoms need to be a call to both me and your transplant team.   With the snoring, dry mouth, and throat symptoms, I'm going to order another sleep study that will be done at home.     Cologuard will be mailed to your house.    Come back to see me for a pap smear.    [name], MD  [facility] at [facility]

## 2024-04-30 NOTE — Progress Notes (Signed)
 Gina Hart has been contacted in regards to their refill of cycloSPORINE  modified 25 MG capsule (NEORAL ). At this time, they have declined refill due to patient having 37  doses remaining. Refill assessment call date has been updated per the patient's request.

## 2024-05-01 DIAGNOSIS — Z940 Kidney transplant status: Principal | ICD-10-CM

## 2024-05-02 DIAGNOSIS — Z940 Kidney transplant status: Principal | ICD-10-CM

## 2024-05-02 LAB — URINALYSIS WITH MICROSCOPY WITH CULTURE REFLEX
BILIRUBIN UA: NEGATIVE
BLOOD UA: NEGATIVE
GLUCOSE UA: NEGATIVE
KETONES UA: NEGATIVE
LEUKOCYTE ESTERASE UA: NEGATIVE
NITRITE UA: NEGATIVE
PH UA: 7.5 (ref 4.5–8)
PROTEIN UA: NEGATIVE
SPECIFIC GRAVITY UA: 1.02 (ref 1.005–1.030)
UROBILINOGEN UA: 1

## 2024-05-02 LAB — LIPID PANEL
LDL CHOLESTEROL CALCULATED: 87
LDL/HDL RATIO: 3.175

## 2024-05-02 LAB — PROTEIN / CREATININE RATIO, URINE
CREATININE, URINE: 54.9
PROTEIN URINE: 19.1
PROTEIN/CREAT RATIO, URINE: 348 mg/g — ABNORMAL HIGH (ref 0–200)

## 2024-05-02 LAB — CHOLESTEROL, TOTAL: CHOLESTEROL: 181

## 2024-05-02 LAB — TRIGLYCERIDES: TRIGLYCERIDES: 179 (ref 30–200)

## 2024-05-02 LAB — HDL CHOLESTEROL: HDL CHOLESTEROL: 57 (ref 35–60)

## 2024-05-02 MED ORDER — CEFDINIR 300 MG CAPSULE
ORAL_CAPSULE | Freq: Two times a day (BID) | ORAL | 0 refills | 10.00000 days | Status: CP
Start: 2024-05-02 — End: 2024-05-12

## 2024-05-02 NOTE — Telephone Encounter (Signed)
 Called patient regarding positive urine culture.  Per Dr Macie, patient to start Cefdinir 300 mg BID x 10 days.  Left detailed VM about this as well as need for patient to get [facility] labs when she is able.  Some of these labs must be done at [facility] if patient is able to return for these in the next few weeks.    Script sent to [employer].

## 2024-05-02 NOTE — Telephone Encounter (Signed)
 Attempted to return a all back and speak with Randall with [facility] no answer left voice mail.

## 2024-05-05 ENCOUNTER — Ambulatory Visit: Admit: 2024-05-05 | Discharge: 2024-05-06 | Payer: Medicare (Managed Care)

## 2024-05-05 DIAGNOSIS — R799 Abnormal finding of blood chemistry, unspecified: Principal | ICD-10-CM

## 2024-05-05 DIAGNOSIS — R8279 Other abnormal findings on microbiological examination of urine: Principal | ICD-10-CM

## 2024-05-05 DIAGNOSIS — Z940 Kidney transplant status: Principal | ICD-10-CM

## 2024-05-05 LAB — CBC W/ AUTO DIFF
BASOPHILS ABSOLUTE COUNT: 0 10*9/L (ref 0.0–0.1)
BASOPHILS RELATIVE PERCENT: 0.8 %
EOSINOPHILS ABSOLUTE COUNT: 0.2 10*9/L (ref 0.0–0.5)
EOSINOPHILS RELATIVE PERCENT: 3.9 %
HEMATOCRIT: 42.1 % (ref 34.0–44.0)
HEMOGLOBIN: 14.4 g/dL (ref 11.3–14.9)
LYMPHOCYTES ABSOLUTE COUNT: 1 10*9/L — ABNORMAL LOW (ref 1.1–3.6)
LYMPHOCYTES RELATIVE PERCENT: 25.9 %
MEAN CORPUSCULAR HEMOGLOBIN CONC: 34.2 g/dL (ref 32.0–36.0)
MEAN CORPUSCULAR HEMOGLOBIN: 29.4 pg (ref 25.9–32.4)
MEAN CORPUSCULAR VOLUME: 85.9 fL (ref 77.6–95.7)
MEAN PLATELET VOLUME: 7.9 fL (ref 6.8–10.7)
MONOCYTES ABSOLUTE COUNT: 0.7 10*9/L (ref 0.3–0.8)
MONOCYTES RELATIVE PERCENT: 17.2 %
NEUTROPHILS ABSOLUTE COUNT: 2.1 10*9/L (ref 1.8–7.8)
NEUTROPHILS RELATIVE PERCENT: 52.2 %
NUCLEATED RED BLOOD CELLS: 0 /100{WBCs} (ref <=4)
PLATELET COUNT: 260 10*9/L (ref 150–450)
RED BLOOD CELL COUNT: 4.91 10*12/L (ref 3.95–5.13)
RED CELL DISTRIBUTION WIDTH: 13.3 % (ref 12.2–15.2)
WBC ADJUSTED: 3.9 10*9/L (ref 3.6–11.2)

## 2024-05-05 LAB — COMPREHENSIVE METABOLIC PANEL
ALBUMIN: 3.9 g/dL (ref 3.4–5.0)
ALKALINE PHOSPHATASE: 80 U/L (ref 46–116)
ALT (SGPT): 41 U/L (ref 10–49)
ANION GAP: 16 mmol/L — ABNORMAL HIGH (ref 5–14)
AST (SGOT): 41 U/L — ABNORMAL HIGH (ref <=34)
BILIRUBIN TOTAL: 0.5 mg/dL (ref 0.3–1.2)
BLOOD UREA NITROGEN: 17 mg/dL (ref 9–23)
BUN / CREAT RATIO: 19
CALCIUM: 10.5 mg/dL — ABNORMAL HIGH (ref 8.7–10.4)
CHLORIDE: 98 mmol/L (ref 98–107)
CO2: 26.5 mmol/L (ref 20.0–31.0)
CREATININE: 0.88 mg/dL (ref 0.55–1.02)
EGFR CKD-EPI (2021) FEMALE: 81 mL/min/1.73m2 (ref >=60)
GLUCOSE RANDOM: 97 mg/dL (ref 70–179)
POTASSIUM: 3.5 mmol/L (ref 3.4–4.8)
PROTEIN TOTAL: 8.3 g/dL — ABNORMAL HIGH (ref 5.7–8.2)
SODIUM: 140 mmol/L (ref 135–145)

## 2024-05-05 LAB — EBV QUANTITATIVE PCR, BLOOD
EBV QUANT LOG(10): 3.24 {Log_IU}/mL — ABNORMAL HIGH (ref <0.00)
EBV QUANT: 1730 [IU]/mL — ABNORMAL HIGH (ref <0)
EBV VIRAL LOAD RESULT: DETECTED — AB

## 2024-05-05 LAB — CYCLOSPORINE, TROUGH: CYCLOSPORINE, TROUGH: 181 ng/mL (ref 100–400)

## 2024-05-05 LAB — URINALYSIS WITH MICROSCOPY
BACTERIA: NONE SEEN /HPF
BILIRUBIN UA: NEGATIVE
BLOOD UA: NEGATIVE
GLUCOSE UA: NEGATIVE
KETONES UA: NEGATIVE
LEUKOCYTE ESTERASE UA: NEGATIVE
NITRITE UA: NEGATIVE
PH UA: 6 (ref 5.0–9.0)
PROTEIN UA: NEGATIVE
RBC UA: 1 /HPF (ref <=4)
SPECIFIC GRAVITY UA: 1.012 (ref 1.003–1.030)
SQUAMOUS EPITHELIAL: <1 /HPF (ref 0–5)
UROBILINOGEN UA: <2
WBC UA: 1 /HPF (ref 0–5)

## 2024-05-05 LAB — BK VIRUS QUANTITATIVE PCR, BLOOD: BK BLOOD RESULT: NOT DETECTED

## 2024-05-05 LAB — ALBUMIN / CREATININE URINE RATIO
ALBUMIN QUANT URINE: 4.1 mg/dL
ALBUMIN/CREATININE RATIO: 73.1 ug/mg — ABNORMAL HIGH (ref 0.0–30.0)
CREATININE, URINE: 56.1 mg/dL

## 2024-05-05 LAB — MAGNESIUM: MAGNESIUM: 1.7 mg/dL (ref 1.6–2.6)

## 2024-05-05 LAB — FERRITIN: FERRITIN: 156.8 ng/mL (ref 7.3–270.7)

## 2024-05-05 LAB — IRON & TIBC
IRON SATURATION (CALC): 18 %
IRON: 61 ug/dL (ref 50–170)
TOTAL IRON BINDING CAPACITY (CALC): 341.5 ug/dL (ref 250.0–425.0)
TRANSFERRIN: 271 mg/dL (ref 250.0–380.0)

## 2024-05-05 LAB — CMV DNA, QUANTITATIVE, PCR: CMV VIRAL LD: NOT DETECTED

## 2024-05-05 LAB — PHOSPHORUS: PHOSPHORUS: 3.2 mg/dL (ref 2.4–5.1)

## 2024-05-05 LAB — PARATHYROID HORMONE (PTH): PARATHYROID HORMONE INTACT: 96.1 pg/mL — ABNORMAL HIGH (ref 18.4–80.1)

## 2024-05-05 NOTE — Telephone Encounter (Signed)
 Patient paged on-call for lab orders at [facility] requested by Dr. Macie.

## 2024-05-07 DIAGNOSIS — Z940 Kidney transplant status: Principal | ICD-10-CM

## 2024-05-07 LAB — HLA DS POST TRANSPLANT
ANTI-DONOR DRW #1 MFI: 0 MFI
ANTI-DONOR DRW #2 MFI: 0 MFI
ANTI-DONOR HLA-A #1 MFI: 7 MFI
ANTI-DONOR HLA-A #2 MFI: 0 MFI
ANTI-DONOR HLA-B #1 MFI: 0 MFI
ANTI-DONOR HLA-B #2 MFI: 88 MFI
ANTI-DONOR HLA-C #1 MFI: 0 MFI
ANTI-DONOR HLA-C #2 MFI: 0 MFI
ANTI-DONOR HLA-DP #2 MFI: 0 MFI
ANTI-DONOR HLA-DQB #1 MFI: 0 MFI
ANTI-DONOR HLA-DQB #2 MFI: 0 MFI
ANTI-DONOR HLA-DR #1 MFI: 0 MFI
ANTI-DONOR HLA-DR #2 MFI: 0 MFI

## 2024-05-07 LAB — VITAMIN D 25 HYDROXY: VITAMIN D, TOTAL (25OH): 38.6 ng/mL (ref 20.0–80.0)

## 2024-05-07 LAB — FSAB CLASS 1 ANTIBODY SPECIFICITY: HLA CLASS 1 ANTIBODY RESULT: POSITIVE

## 2024-05-07 LAB — FSAB CLASS 2 ANTIBODY SPECIFICITY: HLA CL2 AB RESULT: NEGATIVE

## 2024-05-08 DIAGNOSIS — Z940 Kidney transplant status: Principal | ICD-10-CM

## 2024-05-08 LAB — ALLOSURE KIDNEY: ALLOSURE: 0.53 %

## 2024-05-08 NOTE — Telephone Encounter (Signed)
 Lab orders faxed.

## 2024-05-09 ENCOUNTER — Inpatient Hospital Stay: Admit: 2024-05-09 | Discharge: 2024-05-09 | Payer: Medicare (Managed Care)

## 2024-05-09 NOTE — Progress Notes (Signed)
 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 She can detect strong scents but struggles with subtle ones. Progress noted, continuing regimen without supplements due to complex medication profile.  - Continue olfactory training with current regimen.    Kidney transplant status  Well-managed, no new concerns. Recent labs routine, no significant abnormalities. Doing well post-transplant.  - Continue routine monitoring as per transplant team's recommendations.    Abnormal Pap smear, follow-up planned  Abnormal Pap smear noted a year ago. Due for repeat Pap smear to monitor changes.  - Will schedule repeat Pap smear with short interval return date    General Health Maintenance  Due for Cologuard screening.  - Proceed with Cologuard screening.     Assessment & Plan  Anosmia         Abnormal cervical Papanicolaou smear, unspecified abnormal pap finding         Status post kidney transplant (HHS-HCC)               No follow-ups on file.     Subjective:     Chief Complaint: Follow-up       History of Present Illness  A 48 year old female with a history of kidney transplant presents for a follow-up visit.    Olfactory dysfunction  - Persistent olfactory dysfunction attributed to long COVID  - Partial improvement with olfactory training, particularly with stronger scents such as eucalyptus  - Continues to use all four scents in her olfactory training regimen  - Uses a Vicks plug-in device at night    Recent urinary tract infection  - Recent urinary tract infection treated successfully  - Sore throat has improved since the last visit    Gynecologic and colorectal cancer screening  - Due for Pap smear; last Pap smear was abnormal one year ago  - Inquires about appropriate timing for Cologuard sample collection while on antibiotics         Objective:     Physical Exam       Physical Exam  Vitals reviewed.   Constitutional: General: She is not in acute distress.  HENT:      Mouth/Throat:      Mouth: Mucous membranes are moist.   Pulmonary:      Effort: Pulmonary effort is normal. No respiratory distress.   Abdominal:      General: There is no distension.   Musculoskeletal:         General: No swelling or deformity.   Neurological:      General: No focal deficit present.   Psychiatric:         Mood and Affect: Mood normal.        Visit Vitals  BP 118/72   Temp 36.6 ??C (97.8 ??F)   Ht 149.9 cm (4' 11)   Wt 94.1 kg (207 lb 6.4 oz)   LMP 04/30/2016   BMI 41.89 kg/m??     BP Readings from Last 3 Encounters:   05/09/24 118/72   04/30/24 102/82   04/25/24 132/81     Wt Readings from Last 3 Encounters:   05/09/24 94.1 kg (207 lb 6.4 oz)   04/30/24 94.3 kg (207 lb 14.4 oz)   04/25/24 93.9 kg (207 lb)

## 2024-05-09 NOTE — Patient Instructions (Signed)
 I'm glad the throat is feeling better and that your olfactory training is going well.    You CAN go ahead and collect cologuard.    Come back soon for repeat Pap smear since last year's was abnormal.    J. Con Gasman, MD  [facility] at [facility]

## 2024-05-15 DIAGNOSIS — Z940 Kidney transplant status: Principal | ICD-10-CM

## 2024-05-15 LAB — BASIC METABOLIC PANEL
ANION GAP: 9.8 (ref 2.0–15.0)
BLOOD UREA NITROGEN: 25 mg/dL — ABNORMAL HIGH (ref 0.6–1.3)
BUN / CREAT RATIO: 25 — ABNORMAL HIGH
CALCIUM: 10 (ref 8.5–10.1)
CHLORIDE: 101 mmol/L (ref 100–108)
CO2: 29.2 (ref 21–32)
CREATININE: 1
EGFR CKD-EPI AA FEMALE: 69 — ABNORMAL LOW
GLUCOSE RANDOM: 115 — ABNORMAL HIGH (ref 65–99)
MAGNESIUM: 1.7 — ABNORMAL LOW (ref 1.8–2.4)
OSMOLALITY CALCULATION: 295
PHOSPHORUS: 3 (ref 2.6–4.7)
POTASSIUM: 3 mmol/L — ABNORMAL LOW (ref 3.5–5.1)
SODIUM: 140 (ref 136–145)

## 2024-05-15 LAB — PROTEIN / CREATININE RATIO, URINE
CREATININE, URINE: 70.8
PROTEIN URINE: 21
PROTEIN/CREAT RATIO, URINE: 297 mg/g — ABNORMAL HIGH (ref 0–200)

## 2024-05-15 MED ORDER — LOSARTAN 25 MG TABLET
ORAL_TABLET | Freq: Two times a day (BID) | ORAL | 11 refills | 30.00000 days | Status: CP
Start: 2024-05-15 — End: 2025-05-15

## 2024-05-15 NOTE — Telephone Encounter (Signed)
 Received call from lab of a critical potassium of 3.0.  Spoke with Dr Macie and notified patient of the following plan:    She will stop Amlodipine  due to swelling.    She will stop Chlorthalidone  due to hypokalemia.    She will start Losartan 25 mg BID and monitor for edema and elevated blood pressure.  Script sent to local pharmacy per patient's request.      She will take an extra potassium supplement tonight with dose.      She will repeat labs Monday for repeat potassium level.    ID referral sent for chronically elevated EBV levels.

## 2024-05-16 NOTE — Telephone Encounter (Signed)
 Call back  made to [facility] in regards to see if an order for a compression pump and garment order advised that order has not been received, per staff they will refax again

## 2024-05-19 NOTE — Progress Notes (Signed)
 [facility] Refill Coordination Note    Specialty Medication(s) to be Shipped:   Transplant: cyclosporine  25mg     Other medication(s) to be shipped: No additional medications requested for fill at this time    Specialty Medications not needed at this time: N/A     Gina Hart, DOB: August 06, 1975  Phone: [phone] (home)       All above HIPAA information was verified with patient.     Was a nurse, learning disability used for this call? No    Completed refill call assessment today to schedule patient's medication shipment from the [facility]  [phone]).  All relevant notes have been reviewed.     Specialty medication(s) and dose(s) confirmed: Regimen is correct and unchanged.   Changes to medications: start lisinopril  Changes to insurance: No  New side effects reported not previously addressed with a pharmacist or physician: None reported  Questions for the pharmacist: No    Confirmed patient received a Conservation Officer, Historic Buildings and a Surveyor, Mining with first shipment. The patient will receive a drug information handout for each medication shipped and additional FDA Medication Guides as required.       DISEASE/MEDICATION-SPECIFIC INFORMATION        N/A    SPECIALTY MEDICATION ADHERENCE     Medication Adherence    Patient reported X missed doses in the last month: 0  Specialty Medication: cycloSPORINE  modified 25 MG capsule (NEORAL )  Patient is on additional specialty medications: No              Were doses missed due to medication being on hold? No      cycloSPORINE  modified 25 MG capsule (NEORAL ): 14 days of medicine on hand       REFERRAL TO PHARMACIST     Referral to the pharmacist: Not needed      [facility]     Shipping address confirmed in Epic.     Cost and Payment: Patient has a $0 copay, payment information is not required.    Delivery Scheduled: Yes, Expected medication delivery date: 05/27/24.     Medication will be delivered via UPS to the prescription address in Epic WAM.    [facility]  Specialty Technician

## 2024-05-20 DIAGNOSIS — Z940 Kidney transplant status: Principal | ICD-10-CM

## 2024-05-20 NOTE — Telephone Encounter (Signed)
 Returned patient's call.  She saw ENT in [place] and says she discussed a procedure with ENT where they would do a shaving of her septum.  She would like to discuss with transplant team.  Advised her that we would likely recommend holding Aspirin  7 days prior to procedure.  She states the team will likely send a form of clearance for transplant team.  Fax number provided via My Chart.      She will repeat Potassium level at local lab as well.  One time order faxed to local lab and sent via My Chart as well.

## 2024-05-21 LAB — CYCLOSPORINE, TROUGH: CYCLOSPORINE, TROUGH: 125 ng/mL (ref 100–400)

## 2024-05-26 LAB — URINALYSIS WITH MICROSCOPY WITH CULTURE REFLEX
BILIRUBIN UA: NEGATIVE
BLOOD UA: NEGATIVE
GLUCOSE UA: NEGATIVE
KETONES UA: NEGATIVE
LEUKOCYTE ESTERASE UA: NEGATIVE
NITRITE UA: NEGATIVE
PH UA: 6 (ref 4.5–8)
PROTEIN UA: NEGATIVE
SPECIFIC GRAVITY UA: 1.02 (ref 1.005–1.030)
UROBILINOGEN UA: 0.2

## 2024-05-26 LAB — BASIC METABOLIC PANEL: OSMOLALITY CALCULATION: 292

## 2024-05-26 LAB — COMPREHENSIVE METABOLIC PANEL
ALBUMIN/GLOBULIN RATIO: 0.9 — ABNORMAL LOW (ref 1.10–1.80)
ALBUMIN: 3.6 (ref 2.5–5.0)
ALKALINE PHOSPHATASE: 84
ALT (SGPT): 51
ANION GAP: 8.5 (ref 2.0–15.0)
AST (SGOT): 33 U/L (ref 15–70)
BILIRUBIN TOTAL: 0.4 (ref 0.0–1.0)
BLOOD UREA NITROGEN: 26 — ABNORMAL HIGH (ref 7–18)
BUN / CREAT RATIO: 26 — ABNORMAL HIGH
CALCIUM: 9.8 (ref 8.5–10.1)
CHLORIDE: 105 (ref 100–108)
CO2: 25.5 mmol/L (ref 21–32)
CREATININE: 1 (ref 0.6–1.3)
EGFR CKD-EPI NON-AA MALE: 69 mL/min
GLOBULIN, TOTAL: 4 — ABNORMAL HIGH (ref 2.3–3.5)
GLUCOSE RANDOM: 101 — ABNORMAL HIGH (ref 65–99)
POTASSIUM: 4.4 (ref 3.5–5.1)
PROTEIN TOTAL: 7.6 (ref 6.4–8.2)
SODIUM: 139 mmol/L (ref 136–145)

## 2024-05-26 LAB — PROTEIN / CREATININE RATIO, URINE
CREATININE, URINE: 57.1
PROTEIN URINE: 12.8
PROTEIN/CREAT RATIO, URINE: 224 mg/g — ABNORMAL HIGH (ref 0–200)

## 2024-05-26 MED FILL — CYCLOSPORINE MODIFIED 25 MG CAPSULE: ORAL | 90 days supply | Qty: 720 | Fill #1

## 2024-05-27 DIAGNOSIS — Z940 Kidney transplant status: Principal | ICD-10-CM

## 2024-05-28 DIAGNOSIS — Z940 Kidney transplant status: Principal | ICD-10-CM

## 2024-06-01 ENCOUNTER — Emergency Department
Admit: 2024-06-01 | Discharge: 2024-06-02 | Disposition: A | Discharge status: Home / Self Care | Payer: Medicare (Managed Care)

## 2024-06-01 DIAGNOSIS — R519 Acute nonintractable headache, unspecified headache type: Principal | ICD-10-CM

## 2024-06-01 DIAGNOSIS — Z940 Kidney transplant status: Principal | ICD-10-CM

## 2024-06-01 DIAGNOSIS — I10 Essential (primary) hypertension: Principal | ICD-10-CM

## 2024-06-01 LAB — CBC W/ AUTO DIFF
BASOPHILS ABSOLUTE COUNT: 0 10*9/L (ref 0.0–0.1)
BASOPHILS RELATIVE PERCENT: 0.2 %
EOSINOPHILS ABSOLUTE COUNT: 0 10*9/L (ref 0.0–0.5)
EOSINOPHILS RELATIVE PERCENT: 0.1 %
HEMATOCRIT: 40.2 % (ref 34.0–44.0)
HEMOGLOBIN: 13.7 g/dL (ref 11.3–14.9)
LYMPHOCYTES ABSOLUTE COUNT: 0.4 10*9/L — ABNORMAL LOW (ref 1.1–3.6)
LYMPHOCYTES RELATIVE PERCENT: 6.4 %
MEAN CORPUSCULAR HEMOGLOBIN CONC: 34 g/dL (ref 32.0–36.0)
MEAN CORPUSCULAR HEMOGLOBIN: 29.7 pg (ref 25.9–32.4)
MEAN CORPUSCULAR VOLUME: 87.1 fL (ref 77.6–95.7)
MEAN PLATELET VOLUME: 8.4 fL (ref 6.8–10.7)
MONOCYTES ABSOLUTE COUNT: 0.1 10*9/L — ABNORMAL LOW (ref 0.3–0.8)
MONOCYTES RELATIVE PERCENT: 1.1 %
NEUTROPHILS ABSOLUTE COUNT: 5.4 10*9/L (ref 1.8–7.8)
NEUTROPHILS RELATIVE PERCENT: 92.2 %
NUCLEATED RED BLOOD CELLS: 0 /100{WBCs} (ref <=4)
PLATELET COUNT: 255 10*9/L (ref 150–450)
RED BLOOD CELL COUNT: 4.62 10*12/L (ref 3.95–5.13)
RED CELL DISTRIBUTION WIDTH: 13.7 % (ref 12.2–15.2)
WBC ADJUSTED: 5.8 10*9/L (ref 3.6–11.2)

## 2024-06-01 LAB — COMPREHENSIVE METABOLIC PANEL
ALBUMIN: 3.9 g/dL (ref 3.4–5.0)
ALKALINE PHOSPHATASE: 71 U/L (ref 46–116)
ANION GAP: 14 mmol/L (ref 5–14)
AST (SGOT): 54 U/L — ABNORMAL HIGH (ref <=34)
BILIRUBIN TOTAL: 0.5 mg/dL (ref 0.3–1.2)
CALCIUM: 10.5 mg/dL — ABNORMAL HIGH (ref 8.7–10.4)
CHLORIDE: 104 mmol/L (ref 98–107)
CO2: 21.5 mmol/L (ref 20.0–31.0)
GLUCOSE RANDOM: 137 mg/dL (ref 70–179)
PROTEIN TOTAL: 8.8 g/dL — ABNORMAL HIGH (ref 5.7–8.2)
SODIUM: 139 mmol/L (ref 135–145)

## 2024-06-01 LAB — URINALYSIS WITH MICROSCOPY
BACTERIA: NONE SEEN /HPF
BILIRUBIN UA: NEGATIVE
BLOOD UA: NEGATIVE
GLUCOSE UA: NEGATIVE
KETONES UA: NEGATIVE
LEUKOCYTE ESTERASE UA: NEGATIVE
NITRITE UA: NEGATIVE
PH UA: 6 (ref 5.0–9.0)
RBC UA: 2 /HPF (ref <=4)
SPECIFIC GRAVITY UA: 1.022 (ref 1.003–1.030)
SQUAMOUS EPITHELIAL: <1 /HPF (ref 0–5)
UROBILINOGEN UA: <2
WBC UA: 1 /HPF (ref 0–5)

## 2024-06-01 LAB — BUN: BLOOD UREA NITROGEN: 13 mg/dL (ref 9–23)

## 2024-06-01 LAB — POTASSIUM: POTASSIUM: 4.7 mmol/L (ref 3.4–4.8)

## 2024-06-01 LAB — GREEN LITHIUM HEPARIN EXTRA TUBE: EXTRA TUBE GREEN LI HEP: 0

## 2024-06-01 LAB — MAGNESIUM: MAGNESIUM: 2 mg/dL (ref 1.6–2.6)

## 2024-06-01 LAB — C-REACTIVE PROTEIN: C-REACTIVE PROTEIN: <5 mg/L (ref <=10.0)

## 2024-06-01 LAB — SEDIMENTATION RATE: ERYTHROCYTE SEDIMENTATION RATE: 42 mm/h — ABNORMAL HIGH (ref 0–20)

## 2024-06-01 LAB — CREATININE
CREATININE: 0.72 mg/dL (ref 0.55–1.02)
EGFR CKD-EPI (2021) FEMALE: >90 mL/min/1.73m2 (ref >=60)

## 2024-06-01 LAB — HIGH SENSITIVITY TROPONIN I - SINGLE: HIGH SENSITIVITY TROPONIN I: <3 ng/L (ref <=34)

## 2024-06-01 MED ADMIN — loratadine (CLARITIN) tablet 10 mg: 10 mg | ORAL | Stop: 2024-06-01

## 2024-06-01 MED ADMIN — prochlorperazine (COMPAZINE) injection 5 mg: 5 mg | INTRAVENOUS | @ 15:00:00 | Stop: 2024-06-01

## 2024-06-01 MED ADMIN — acetaminophen (TYLENOL) tablet 650 mg: 650 mg | ORAL | @ 15:00:00 | Stop: 2024-06-01

## 2024-06-01 NOTE — Consults (Signed)
 Initial Consult Note        Requesting Attending Physician:  Kayla LELON Hoe, MD  Service Requesting Consult: Emergency Medicine     Assessment and Plan          Gina Hart is a 48 y.o. right handed female with PMH of ESRD, kidney transplant (2018), hypertension on whom I have been asked by Kayla LELON Hoe, MD to consult for concern for headache, blurry vision and concern for increased intracranial pressure.      # Headache  # Blurry vision  # Concern for optic nerve edema on admission  Presented to OSH ED for hypertension with BP 181/105 in the setting of recent antihypertensive medication changes. Left sided headache behind her eye w/ bilateral blurry vision, L ear pain which started 3 days ago. CT head 12/7 demonstrated mildly expanded partially empty sella which is nonspecific, but can be seen in IIH. OSH w/ concern for increased intracranial pressure with optic nerve edema on POCUS. Patient's headache and blurry vision resolved prior to transfer following dose of tylenol  and compazine . Patient denies any current symptoms. Highest on differential at this time is hypertensive urgency/emergency w/ hypertension and headache. Patient had prior, similar headache and vision changes in the setting of hypertension. No focal neurologic deficits on examination and CTH without evidence of PRES, infarct or mass lesion. IIH also on differential, but given patients symptomatic hypertension (BP >180/100 on home blood pressure monitoring in the setting of recent medication adjustments) favor hypertensive emergency as the etiology of the patient's headache and blurry vision.      Recommendations:  - Agree with Ophthalmology consult  - Can consider MRI brain with and without contrast and MRV if significant papilledema found on Ophtho exam  - Medical management of hypertension; recommend consulting Transplant Nephrology for assistance      This patient was seen and discussed with Dr. Vicci, who agrees with the above assessment and plan.  Recommendations were discussed with patient and primary team.    Charmaine Lore, MD  Pediatric Neurology, PGY-3  [facility]          HPI        Reason for Consult: idiopathic intracranial hypertension    Gina Hart is a 48 y.o. right handed female on whom I have been asked by Kayla LELON Hoe, MD to consult for idiopathic intracranial hypertension.    History of Present Illness  Gina Hart is a 48 year old female with hypertension and a history of kidney transplant who presents with headache and vision changes. She was transferred to [facility] for neurology for further evaluation.    Her symptoms began approximately four days ago following a change in her blood pressure medication regimen 1 week ago. Her blood pressure was 181/105, so she called EMS for hypertension greater than her baseline.     The headache is localized to the left side of her head, with a sensation of pressure behind the left eye and discomfort deep in the left ear.   She also experiences vertigo with the room spinning. She has had these headaches before when she has been hypertensive in the past. Headache is worse with lying flat. The headache was initially unresponsive to Tylenol , but she reports that it eased after receiving medication at [facility] earlier today. She notes that the headache is not currently present, but she experiences neck pain.    She describes her vision as blurry when looking at her phone. Her vision is otherwise clear when looking  at other objects, and the blurriness has improved since earlier today. She reports that she usually has blurry vision when looking at her phone at baseline and has seen an eye doctor who has recommended her use glasses for reading.     Her current medication include losartan , following a recent change from amlodipine  and chlorthalidone . She reports that she is also on metoprolol     No current weakness, dizziness, tinnitus, or double vision. No current headache, but reports neck pain. Allergies[1]     Current Medications[2] Prescriptions Prior to Admission[3]    Past Medical History[4]    Past Surgical History[5]    Social History[6]    Family History[7]    Code Status: Full Code     Review of Systems     A 10-system review of systems was conducted and was negative except as documented above in the HPI.       Objective        Temp:  [36.6 ??C (97.8 ??F)] 36.6 ??C (97.8 ??F)  Pulse:  [66-79] 69  SpO2 Pulse:  [66-74] 66  Resp:  [15-29] 28  BP: (145-167)/(86-97) 153/90  MAP (mmHg):  [103-116] 110  SpO2:  [95 %-99 %] 99 %  No intake/output data recorded.    Physical Exam:  General Appearance:Well appearing. In no acute distress.  HEENT: Head is atraumatic and normocephalic. Sclera anicteric without injection. Oropharyngeal membranes are moist with no erythema or exudate.  Neck: Supple.  Lungs: Comfortable work of breathing  Heart: Warm and well perfused   Abdomen: Soft, nontender, nondistended.  Extremities: No clubbing, cyanosis, or edema.  Skin: No bruises, rashes, or lesions noted    Neurological Examination:     Mental Status: Alert, conversant, able to follow conversation and interview. Spontaneous speech was fluent without word finding pauses, dysarthria, or paraphasic errors. Comprehension was intact to simple and multi-step commands. Memory for recent and remote events was intact.    Cranial Nerves: PERRL. Pursuit eye movements were uninterrupted with full range and without more than end-gaze nystagmus. Facial sensation intact bilaterally to light touch on the forehead, cheek, and chin. Face symmetric at rest. Normal facial movement bilaterally, including forehead, eye closure and grimace/smile. Hearing intact to conversation. Shoulder shrug full strength bilaterally. Palate movement is symmetric. Tongue protrudes midline and tongue movements are normal.    Motor Exam: Normal bulk.  No tremors, myoclonus, or other adventitious movement.  Pronator drift is absent.  Strength:  Dlt  Bic  Tri WE  WF  FgS  Grp  HF  KF  KE  PF  DF    Left 5 5    5    5    5    5    5    5    5    5    5    5       Right 5    5    5    5    5    5    5    5    5    5    5    5         Reflexes:   R L   Biceps +2 +2   Brachioradialis +2 +2   Triceps +2 +2   Patella +3 +3   Achilles +3 +3   Toes are mute bilaterally.    Sensory: Sensation normal to light touch in both hands and both feet  Cerebellar/Coordination/Gait: Rapid alternating movements are normal in bilateral upper extremities. Finger-to-nose is normal without ataxia or dysmetria bilaterally. Heel-to-shin is normal without ataxia or dysmetria bilaterally. Gait exam deferred           Diagnostic Studies      All Labs Last 24hrs:   Recent Results (from the past 24 hours)   ECG 12 Lead    Collection Time: 06/01/24  9:12 AM   Result Value Ref Range    EKG Systolic BP  mmHg    EKG Diastolic BP  mmHg    EKG Ventricular Rate 67 BPM    EKG Atrial Rate 67 BPM    EKG P-R Interval 164 ms    EKG QRS Duration 88 ms    EKG Q-T Interval 386 ms    EKG QTC Calculation 407 ms    EKG Calculated P Axis 34 degrees    EKG Calculated R Axis 45 degrees    EKG Calculated T Axis 35 degrees    QTC Fredericia 400 ms   Comprehensive Metabolic Panel    Collection Time: 06/01/24  9:42 AM   Result Value Ref Range    Sodium 139 135 - 145 mmol/L    Potassium      Chloride 104 98 - 107 mmol/L    CO2 21.5 20.0 - 31.0 mmol/L    Anion Gap 14 5 - 14 mmol/L    BUN      Creatinine      eGFR CKD-EPI (2021) Female      Glucose 137 70 - 179 mg/dL    Calcium 89.4 (H) 8.7 - 10.4 mg/dL    Albumin 3.9 3.4 - 5.0 g/dL    Total Protein 8.8 (H) 5.7 - 8.2 g/dL    Total Bilirubin 0.5 0.3 - 1.2 mg/dL    AST 54 (H) <=65 U/L    ALT      Alkaline Phosphatase 71 46 - 116 U/L   Magnesium     Collection Time: 06/01/24  9:42 AM   Result Value Ref Range    Magnesium  2.0 1.6 - 2.6 mg/dL   HS Troponin 0h    Collection Time: 06/01/24  9:42 AM   Result Value Ref Range    hsTroponin I <3 <=34 ng/L   C-reactive protein    Collection Time: 06/01/24  9:42 AM   Result Value Ref Range    CRP <5.0 <=10.0 mg/L   Respiratory Pathogen Panel with COVID (Nasopharyngeal)    Collection Time: 06/01/24  9:47 AM   Result Value Ref Range    Adenovirus Not Detected Not Detected    Coronavirus HKU1 Not Detected Not Detected    Coronavirus NL63 Not Detected Not Detected    Coronavirus 229E Not Detected Not Detected    Coronavirus OC43 PCR Not Detected Not Detected    Metapneumovirus Not Detected Not Detected    Rhinovirus/Enterovirus Not Detected Not Detected    Influenza A Not Detected Not Detected    Influenza B Not Detected Not Detected    Parainfluenza 1 Not Detected Not Detected    Parainfluenza 2 Not Detected Not Detected    Parainfluenza 3 Not Detected Not Detected    Parainfluenza 4 Not Detected Not Detected    RSV Not Detected Not Detected    Chlamydophila (Chlamydia) pneumoniae Not Detected Not Detected    Mycoplasma pneumoniae Not Detected Not Detected    SARS-CoV-2 PCR Not Detected Not Detected   CBC w/ Differential    Collection Time: 06/01/24  10:16 AM   Result Value Ref Range    WBC 5.8 3.6 - 11.2 10*9/L    RBC 4.62 3.95 - 5.13 10*12/L    HGB 13.7 11.3 - 14.9 g/dL    HCT 59.7 65.9 - 55.9 %    MCV 87.1 77.6 - 95.7 fL    MCH 29.7 25.9 - 32.4 pg    MCHC 34.0 32.0 - 36.0 g/dL    RDW 86.2 87.7 - 84.7 %    MPV 8.4 6.8 - 10.7 fL    Platelet 255 150 - 450 10*9/L    nRBC 0 <=4 /100 WBCs    Neutrophils % 92.2 %    Lymphocytes % 6.4 %    Monocytes % 1.1 %    Eosinophils % 0.1 %    Basophils % 0.2 %    Absolute Neutrophils 5.4 1.8 - 7.8 10*9/L    Absolute Lymphocytes 0.4 (L) 1.1 - 3.6 10*9/L    Absolute Monocytes 0.1 (L) 0.3 - 0.8 10*9/L    Absolute Eosinophils 0.0 0.0 - 0.5 10*9/L    Absolute Basophils 0.0 0.0 - 0.1 10*9/L   Sedimentation rate, manual    Collection Time: 06/01/24 10:16 AM   Result Value Ref Range    Sed Rate 42 (H) 0 - 20 mm/h   GREEN LITHIUM HEPARIN EXTRA TUBE    Collection Time: 06/01/24 10:37 AM   Result Value Ref Range    Extra Tube Green Lithium Heparin 0    Potassium Level    Collection Time: 06/01/24 10:37 AM   Result Value Ref Range    Potassium 4.7 3.4 - 4.8 mmol/L   BUN    Collection Time: 06/01/24 10:37 AM   Result Value Ref Range    BUN 13 9 - 23 mg/dL   Creatinine    Collection Time: 06/01/24 10:37 AM   Result Value Ref Range    Creatinine 0.72 0.55 - 1.02 mg/dL    eGFR CKD-EPI (7978) Female >90 >=60 mL/min/1.54m2   Urinalysis with Microscopy    Collection Time: 06/01/24 10:47 AM   Result Value Ref Range    Color, UA Light Yellow     Clarity, UA Clear     Specific Gravity, UA 1.022 1.003 - 1.030    pH, UA 6.0 5.0 - 9.0    Leukocyte Esterase, UA Negative Negative    Nitrite, UA Negative Negative    Protein, UA Trace (A) Negative    Glucose, UA Negative Negative    Ketones, UA Negative Negative    Urobilinogen, UA <2.0 mg/dL <7.9 mg/dL    Bilirubin, UA Negative Negative    Blood, UA Negative Negative    RBC, UA 2 <=4 /HPF    WBC, UA 1 0 - 5 /HPF    Squam Epithel, UA <1 0 - 5 /HPF    Bacteria, UA None Seen None Seen /HPF       Charmaine Lore, MD, personally reviewed:  CT head and labs         Attending Physician Attestation:  I saw the patient with the Resident. I discussed the findings, assessment, and plan with the Resident and agree with the findings and plan as documented in the Resident's note.     48 year-old woman with obesity class III (BMI 42), hypertensive ESRD s/p renal transplant on immunosuppression presenting from [facility] ED with headache, vertigo, blurry vision in the setting of hypertensive emergency. At the time of my evaluation, headache had resolved with tylenol  and  compazine . Neurologic examination was nonfocal. Dilated eye examination with ophthalmology showed no evidence of papilledema. Overall presentation most consistent with hypertensive emergency. Recommend medical management of hypertension; consider engaging transplant nephrology for assistance. Should headache recu, will treat with migraine cocktail (IV tylenol , compazine , benadryl, magnesium  and IV fluids). Can offer neurology follow up after discharge for episodic headache.    Geni CHARM Louder, MD, MS  Clinical Assistant Professor  [facility] Department of Neurology         [1]   Allergies  Allergen Reactions    Ibuprofen Swelling     Other reaction(s): SWELLING/EDEMA    Sulfa  (Sulfonamide Antibiotics)    [2]   No current facility-administered medications for this encounter.     Current Outpatient Medications   Medication Sig Dispense Refill    acetaminophen  (TYLENOL ) 325 MG tablet Take 1-2 tablets (325-650 mg total) by mouth every four (4) hours as needed for pain. 100 tablet 0    albuterol  HFA 90 mcg/actuation inhaler INHALE TWO PUFFS BY MOUTH EVERY 6 HOURS AS NEEDED FOR WHEEZING (Patient not taking: Reported on 04/30/2024) 8.5 g 0    amlodipine  (NORVASC ) 2.5 MG tablet Take 3 tablets (7.5 mg total) by mouth daily. 90 tablet 11    aspirin  (ECOTRIN) 81 MG tablet Take 1 tablet (81 mg total) by mouth daily.      biotin  5 mg cap Take 1 capsule (5,000 mcg total) by mouth daily. 30 capsule 11    budesonide -formoterol  (SYMBICORT ) 160-4.5 mcg/actuation inhaler 2 puff with spacer bid 10.2 g 11    chlorthalidone  (HYGROTON ) 25 MG tablet Take 1 tablet (25 mg total) by mouth every morning. 90 tablet 3    cholecalciferol , vitamin D3-50 mcg, 2,000 unit,, 50 mcg (2,000 unit) cap Take 1 capsule (50 mcg total) by mouth in the morning. 30 capsule 11    cycloSPORINE  modified (NEORAL ) 25 MG capsule Take 4 capsules (100 mg total) by mouth two (2) times a day. 240 capsule 11    ferrous sulfate  325 (65 FE) MG tablet Take 1 tablet (325 mg total) by mouth two (2) times a day. 60 tablet 11    fluticasone  propionate (FLONASE ) 50 mcg/actuation nasal spray 1 spray by Each Nare route daily. 16 g 4    loratadine  (CLARITIN ) 10 mg tablet Take 1 tablet (10 mg total) by mouth daily as needed for allergies.      losartan  (COZAAR ) 25 MG tablet Take 1 tablet (25 mg total) by mouth two (2) times a day. 60 tablet 11 magnesium  oxide (MAG-OX) 400 mg (241.3 mg elemental magnesium ) tablet Take 1 tablet (400 mg total) by mouth daily. 30 tablet 11    meclizine (ANTIVERT) 25 mg tablet Take 1 tablet (25 mg total) by mouth as needed.      melatonin 5 mg tablet Take 1 tablet (5 mg total) by mouth nightly as needed. 30 tablet 11    metoPROLOL  tartrate (LOPRESSOR ) 100 MG tablet TAKE ONE TABLET TWICE DAILY 180 tablet 3    nebulizer and compressor Devi COMPRESSOR NEBULIZER      polyethylene glycol (GLYCOLAX ) 17 gram/dose powder Take 17 g by mouth continuous as needed.      potassium chloride  20 MEQ ER tablet Take 2 tablets (40 mEq total) by mouth two (2) times a day. 120 tablet 11    predniSONE  (DELTASONE ) 5 MG tablet TAKE ONE TABLET BY MOUTH ONCE DAILY 30 tablet 11    rosuvastatin  (CRESTOR ) 5 MG tablet TAKE ONE TABLET BY MOUTH ONCE DAILY  90 tablet 3   [3] (Not in a hospital admission)   [4]   Past Medical History:  Diagnosis Date    ESRD (end stage renal disease) (CMS-HCC)     Focal segmental glomerulosclerosis 1998    Hypertension     Pulmonary hypertension    (CMS-HCC)     Scoliosis (and kyphoscoliosis), idiopathic    [5]   Past Surgical History:  Procedure Laterality Date    BACK SURGERY      CHG US  GUIDE, VASCULAR ACCESS Right 07/11/2013    Procedure: ULTRASOUND GUIDANCE FOR VASC ACCESS REQUIRING US  EVAL OF POTENTIAL ACCESS SITES;  Surgeon: Fairy JINNY Moons, MD;  Location: MAIN OR [facility];  Service: Vascular    PR AV ANAST,FOREARM VEIN TRANSPOSITION Right 07/11/2013    Procedure: R. UE BRACHIOCEPHALIC FISTULA;  Surgeon: Fairy JINNY Moons, MD;  Location: MAIN [facility];  Service: Vascular    PR AV ANAST,UP ARM BASILIC VEIN TRANSPOSIT Right 07/11/2013    Procedure: BASILIC VEIN TRANSPOSITION;  Surgeon: Fairy JINNY Moons, MD;  Location: MAIN OR [facility];  Service: Vascular    PR RIGHT HEART CATH O2 SATURATION & CARDIAC OUTPUT N/A 06/16/2015    Procedure: Right Heart Catheterization;  Surgeon: Zachary Prentice Car, MD;  Location: [facility];  Service: Cardiology    PR TRANSPLANTATION OF KIDNEY Right 01/11/2017    Procedure: RENAL ALLOTRANSPLANTATION, IMPLANTATION OF GRAFT; WITHOUT RECIPIENT NEPHRECTOMY;  Surgeon: Marsa Sam Boss, MD;  Location: MA[facility];  Service: Transplant   [6]   Social History  Socioeconomic History    Marital status: Divorced    Number of children: 3    Years of education: 12   Occupational History    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 No    Tanning bed use? No    Are you easily burned? No    Excessive sun exposure? No    Blistering sunburns? No     Social Drivers of Health     Food Insecurity: No Food Insecurity (04/07/2024)    Hunger Vital Sign     Worried About Running Out of Food in the Last Year: Never true     Ran Out of Food in the Last Year: Never true   Tobacco Use: Low Risk (05/09/2024)    Patient History     Smoking Tobacco Use: Never     Smokeless Tobacco Use: Never   Transportation Needs: No Transportation Needs (04/07/2024)    PRAPARE - Transportation     Lack of Transportation (Medical): No     Lack of Transportation (Non-Medical): No   Alcohol Use: Not At Risk (02/15/2024)    Alcohol Use     How often do you have a drink containing alcohol?: Never     How many drinks containing alcohol do you have on a typical day when you are drinking?: 1 - 2     How often do you have 5 or more drinks on one occasion?: Never   Housing: Low Risk (04/07/2024)    Housing     Within the past 12 months, have you ever stayed:  outside, in a car, in a tent, in an overnight shelter, or temporarily in someone else's home (i.e. couch-surfing)?: No     Are you worried about losing your housing?: No   Physical Activity: Insufficiently Active (02/15/2024)    Exercise Vital Sign     Days of Exercise per Week: 2 days     Minutes of Exercise per Session: 30 min   Utilities: Low Risk (04/07/2024)    Utilities     Within the past 12 months, have you been unable to get utilities (heat, electricity) when it was really needed?: No   Stress: No Stress Concern Present (02/15/2024)    [employer] of Occupational Health - Occupational Stress Questionnaire     Feeling of Stress: Only a little   Interpersonal Safety: Not At Risk (06/01/2024)    Interpersonal Safety     Unsafe Where You Currently Live: No     Physically Hurt by Anyone: No     Abused by Anyone: No   Substance Use: Low Risk (02/15/2024)    Substance Use     In the past year, how often have you used prescription drugs for non-medical reasons?: Never     In the past year, how often have you used illegal drugs?: Never     In the past year, have you used any substance for non-medical reasons?: No   Social Connections: Socially Isolated (02/15/2024)    Social Connection and Isolation Panel     Frequency of Communication with Friends and Family: More than three times a week     Frequency of Social Gatherings with Friends and Family: Three times a week     Attends Religious Services: Never     Active Member of Clubs or Organizations: No     Attends Banker Meetings: Never     Marital Status: Never married   Health Literacy: Low Risk (02/15/2024)    Health Literacy     : Never   [7]   Family History  Problem Relation Age of Onset    Hypertension Mother     No Known Problems Father     No Known Problems Sister     No Known Problems Daughter     No Known Problems Maternal Grandmother     No Known Problems Maternal Grandfather     No Known Problems Paternal Grandmother     No Known Problems Paternal Grandfather     BRCA 1/2 Neg Hx     Breast cancer Neg Hx     Cancer Neg Hx     Colon cancer Neg Hx     Endometrial cancer Neg Hx     Ovarian cancer Neg Hx     Melanoma Neg Hx     Basal cell carcinoma Neg Hx     Squamous cell carcinoma Neg Hx

## 2024-06-01 NOTE — ED Progress Note (Addendum)
 ED Progress Note    The patient is a 48 year old female with ESRD, history of kidney transplant (2018, on immunosuppression), and hypertension, transferred for evaluation of one episode of marked hypertension (181/105), several days of left-sided headache behind the left eye, bilateral blurry vision, and left ear pain. The headache and visual symptoms initially resolved with acetaminophen  and compazine  prior to transfer, but headache has since recurred. She has not taken her antihypertensive medications today; current BP is 144/84. Neurology and ophthalmology were consulted to evaluate for possible idiopathic intracranial hypertension (IIH) and papilledema.    Neurology noted that, while IIH was considered, the clinical picture was most consistent with hypertensive urgency/emergency given her history and symptom improvement with medical management. Neurologic exam was notable for symmetric hyperreflexia, otherwise nonfocal. Ophthalmology found no evidence of optic nerve edema or papilledema on exam, with only slightly tilted discs noted. Visual acuity, color vision, and intraocular pressure were all normal. Both consultants discussed that the absence of papilledema does not fully exclude elevated intracranial pressure, but the overall sensitivity of fundus exam for ICP is limited. CT head showed a mildly expanded, partially empty sella, which is nonspecific and occasionally seen in IIH.    After careful review and shared decision-making, we determined that the likelihood of a significant intracranial process requiring advanced imaging is low. Based on her controlled blood pressure, the resolution of most neurological symptoms, and the reassuring normal ophthalmologic findings, we elected not to pursue additional MRI/MRV imaging at this time. The benefits of obtaining advanced imaging are not expected to change management, nor are they justified given her improved clinical status, especially in the setting of her known hypertension which makes the current presentation more consistent with hypertensive urgency vs hypertension 2/2 uncontrolled headache rather than IIH.     I would in fact favor hypertension 2/2 uncontrolled headache, as her BP was noted to have dropped to 140-150s/80-90s after her headachew as controlled, and then uptrended slightly to 166/85 with return of her headache.    Patient elects for discharge at this time with OTC headache control, resumption of her antihypertensive medications, and maintaining close outpatient follow-up with both neurology and ophthalmology. This decision was made in conjunction with our consultants and after discussing the clinical reasoning with the patient.       The patient was counseled that while today???s evaluation is reassuring, no test or workup can eliminate all risk, and symptoms may evolve.     I discussed the clinical findings, diagnostic limitations, and potential risks of deterioration with the patient in detail. In a pleasant and collaborative conversation, we reviewed the reasoning behind the current plan and why admission or additional testing was not pursued at this time. I emphasized the importance of returning for any concerning changes.  Clear return precautions and follow-up recommendations were provided verbally and in writing. The patient verbalized understanding, had all questions answered, and agreed with the discharge plan. She agreed with the plan and commitment to seek immediate medical attention for any recurrence or worsening symptoms.

## 2024-06-01 NOTE — ED Notes (Signed)
Bed: 21-B  Expected date:   Expected time:   Means of arrival:   Comments:  transfer

## 2024-06-01 NOTE — ED Triage Note (Signed)
 Pt to triage with C/O BP being higher than normal. Pt is on blood pressure medications that have recently been change. Pt reporting left ear pain and vertigo that started 3 days ago also.

## 2024-06-01 NOTE — ED Progress Note (Signed)
 Received sign out from previous provider.    Patient Summary: Gina Hart is a 48 y.o. female  with history of ESRD, kidney transplant (2018, on immunosuppression), and hypertension who presents with 1 episode of measured hypertension (181/105) this morning, few days of left sided headache behind the left eye, with associated bilateral blurry vision and left ear pain.   Action List:   Paged neurology. Neurology noted that, while IIH was considered, the clinical picture was most consistent with hypertensive urgency/emergency given her history and symptom improvement with medical management.  Paged ophthalmology for evaluation of papilledema.  Signed out to next ED provider.     Updates  ED Course as of 06/03/24 0417   [place] Jun 01, 2024   1530 Patient transferred from [facility] to main campus for VIR LP and neurology evaluation of headache, ear pain, and vision changes.  Have consulted neurology.   60 Spoke with neurology consult team who plans to see her.  In the meantime they recommend consulting ophthalmology to rule out papilledema since there is concern that this patient has IIH   1541 Have paged ophthalmology consult team

## 2024-06-01 NOTE — Consults (Signed)
 This patient was dilated by Ophthalmology at 6:35pm on 06/01/24. Permission was given by Meghan Mallya/neurology.     Ophthalmology Consult Note      Requesting Attending Physician: Kayla LELON Hoe  Service Requesting Consult: Emergency Medicine   Consult Attending Physician: Dr. Berneda    Assessment:  48 y.o. female with a history of headaches and hypertension presenting/consulted to evaluate for optic disc edema.    Ophthalmology was consulted for assistance with evaluation and management.    #Headaches  #Hypertension  #No optic nerve edema, both eyes   - VA 20/20 OU, IOP wnl, no APD, color vision full OU  - BP elevated on arrival to ED, with recent change in BP medications outpatient  - Per patient she has had episodes of similar headache in past when BP medications were adjusted   - Pt endorses headache. Pt denies nausea/vomiting. Pt denies pulsatile tinnitus. Pt denies transient visual obscurations. Pt denies diplopia. Pt denies tetracycline or Vit A derivative use. Endorses PO prednisone . Pt denies other systemic symptoms.   - No appreciable optic nerve edema both eyes (slightly tilted disc OD, which gives the appearance of slight elevation of the nasal rim of the right ON, however the margins are crisp)  - note that the lack of optic nerve edema does NOT rule out increased intracranial pressure  - The sensitivity of optic nerve edema fundus exams (i.e., detecting papilledema) in identifying increased intracranial pressure (ICP) is generally considered to be low to moderate. According to a study by Sunnie dunker al., the incidence of papilledema in patients with acute elevated ICP was relatively uncommon. In their cohort, only a small fraction of patients with significantly elevated ICP demonstrated papilledema on fundus examination.  - CT head with partially empty sella which is nonspecific but can be seen in IIH  - Overall the patient's symptoms and exam seem most consistent with sequelae of hypertension    Plan:  - no obvious signs of optic nerve edema/papilledema  - if suspicion for increased intracranial pressure is still high, would suggest the following:  - MRI/MRV brain and orbits with and without contrast, CN II protocol, to evaluate for mass lesion or venous sinus thrombosis  - If MRI is unrevealing, neurology consultation for lumbar puncture to include:   - opening and closing pressure in lateral decubitus position   - CSF cell count   - CSF protein   - CSF glucose   - CSF culture (bacterial, fungal, AFB)    - Follow up as needed with local ophthalmologist       Discussed with Dr. Berneda.   ________________________    Shelvy Rhea, MD  [facility]  Department of Ophthalmology  Resident Physician, PGY-2     [facility]   [address] [place], [place] [address]  P: [phone]    __________________________________________________________________    Reason for Consult:  Headache/blurred vision    History of Present Illness:  Gina Hart is a 48 y.o. female whom we are asked to see in consultation for above and evaluate for papilledema given clinical concern for IIH.    The patient reports she has had 4 days of headache in the setting of changing her blood pressure medications. She says this has happened in the past when her medications have been changed and her HA improves when her blood pressure is better.     She reports the headaches are present when she wakes up and worse lying down,  but do not get much better during the day unless her BP is good. She denies diplopia and TVOs and pulsatile tinnitus. Headache is mostly left sided.     Hospital Problem List:  Problem List[1]    History:    Past Ocular History:  None    Past Medical History:  Past Medical History[2]    Past Surgical History:  Past Surgical History[3]    Family History:  Negative family ocular history    Social History:  Social History [4]  Unable to assess tobacco use    Medications:  Scheduled Meds:Scheduled Medications[5]  Continuous Infusions:Infusions Meds[6]  PRN Meds:.PRN Medications[7]    Allergies:  Allergies[8]    Review of Systems:  12 systems reviewed and negative unless otherwise stated in HPI or recent HPI    Ophthalmic Exam:  Base Eye Exam       Visual Acuity Latricia Near)         Right Left    Near cc 20/20 20/20-1              Tonometry (Icare, 6:39 PM)         Right Left    Pressure 14 15              Pupils         Pupils Shape React APD    Right PERRL Round Brisk None    Left PERRL Round Brisk None              Visual Fields (Counting fingers)         Left Right     Full Full              Extraocular Movement         Right Left     Full Full              Neuro/Psych       Oriented x3: Yes              Dilation       Both eyes: 1% Tropicamide, 2.5% Phenylephrine @ 6:39 PM                  Additional Tests       Color         Right Left    Eye Handbook App 12/12 12/12                  Slit Lamp and Fundus Exam       External Exam         Right Left    External Normal Normal              Slit Lamp Exam         Right Left    Lids/Lashes Normal Normal    Conjunctiva/Sclera White and quiet White and quiet    Cornea Clear Clear    Anterior Chamber Deep and quiet Deep and quiet    Iris Round and reactive Round and reactive    Lens Clear Clear    Anterior Vitreous Normal Normal              Fundus Exam         Right Left    Disc pink/crisp rim, slight nasal elevation (tilted?), no blurring of disc margin pink crisp rim    C/D Ratio 0.25 0.25    Macula Normal Normal    Vessels Mildly tortuous Mildly tortuous  Periphery Normal, no hemes/CWS Normal, no hemes/CWS                    Diagnostic Testing:  All pertinent labs and imaging results reviewed    __________________________________________________________________           [1]   Patient Active Problem List  Diagnosis    Focal segmental glomerulosclerosis    Hypertension    Displacement of lumbar intervertebral disc    Thoracic or lumbosacral neuritis or radiculitis    Anemia    Mixed anxiety and depressive disorder    Pulmonary hypertension    (CMS-HCC)    Kidney replaced by transplant (HHS-HCC)    Immunosuppressed status (HHS-HCC)    Aftercare following organ transplant    Neck pain    Postlaminectomy syndrome, thoracic    Radicular pain of right lower extremity    Low back pain of over 3 months duration    Viral upper respiratory tract infection    Morbid (severe) obesity due to excess calories (CMS-HCC)    COVID    Cervical dysplasia    Chronic cough    Dyspnea    Colon cancer screening    Abnormal cervical Papanicolaou smear    Back pain    Bacterial vaginosis    Ankle pain, left    Depot contraception    Diarrhea    Disorder of bone, unspecified    Fever    Herpes simplex infection    History of fall    History of hypertension    Need for influenza vaccination    Nephrotic syndrome with diffuse membranous glomerulonephritis    Other bilateral secondary osteoarthritis of hip    Other chronic allergic conjunctivitis    Other long term (current) drug therapy    Uses contraception    Pain in left shoulder    Pain in unspecified joint    Peritoneal dialysis status    Scoliosis deformity of spine    Acute bronchitis    Suspected severe acute respiratory syndrome coronavirus 2 (SARS-CoV-2) infection    Throat pain    Vertigo    Visit for pre-operative examination    Nausea and vomiting   [2]   Past Medical History:  Diagnosis Date    ESRD (end stage renal disease) (CMS-HCC)     Focal segmental glomerulosclerosis 1998    Hypertension     Pulmonary hypertension    (CMS-HCC)     Scoliosis (and kyphoscoliosis), idiopathic    [3]   Past Surgical History:  Procedure Laterality Date    BACK SURGERY      CHG US  GUIDE, VASCULAR ACCESS Right 07/11/2013    Procedure: ULTRASOUND GUIDANCE FOR VASC ACCESS REQUIRING US  EVAL OF POTENTIAL ACCESS SITES;  Surgeon: Fairy JINNY Moons, MD;  Location: MAIN OR [facility];  Service: Vascular    PR AV ANAST,FOREARM VEIN TRANSPOSITION Right 07/11/2013    Procedure: R. UE BRACHIOCEPHALIC FISTULA;  Surgeon: Fairy JINNY Moons, MD;  Location: MAIN OR [facility];  Service: Vascular    PR AV ANAST,UP ARM BASILIC VEIN TRANSPOSIT Right 07/11/2013    Procedure: BASILIC VEIN TRANSPOSITION;  Surgeon: Fairy JINNY Moons, MD;  Location: MAIN OR [facility];  Service: Vascular    PR RIGHT HEART CATH O2 SATURATION & CARDIAC OUTPUT N/A 06/16/2015    Procedure: Right Heart Catheterization;  Surgeon: Zachary Prentice Car, MD;  Location: [facility];  Service: Cardiology    PR TRANSPLANTATION OF KIDNEY Right 01/11/2017    Procedure: RENAL ALLOTRANSPLANTATION, IMPLANTATION  OF GRAFT; WITHOUT RECIPIENT NEPHRECTOMY;  Surgeon: Marsa Sam Boss, MD;  Location: MAIN OR [facility];  Service: Transplant   [4]   Social History  Socioeconomic History    Marital status: Divorced    Number of children: 3    Years of education: 12   Occupational History    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 No    Tanning bed use? No    Are you easily burned? No    Excessive sun exposure? No    Blistering sunburns? No     Social Drivers of Health     Food Insecurity: No Food Insecurity (04/07/2024)    Hunger Vital Sign     Worried About Running Out of Food in the Last Year: Never true     Ran Out of Food in the Last Year: Never true   Tobacco Use: Low Risk (05/09/2024)    Patient History     Smoking Tobacco Use: Never     Smokeless Tobacco Use: Never   Transportation Needs: No Transportation Needs (04/07/2024)    PRAPARE - Transportation     Lack of Transportation (Medical): No     Lack of Transportation (Non-Medical): No   Alcohol Use: Not At Risk (02/15/2024)    Alcohol Use     How often do you have a drink containing alcohol?: Never     How many drinks containing alcohol do you have on a typical day when you are drinking?: 1 - 2     How often do you have 5 or more drinks on one occasion?: Never   Housing: Low Risk (04/07/2024)    Housing     Within the past 12 months, have you ever stayed: outside, in a car, in a tent, in an overnight shelter, or temporarily in someone else's home (i.e. couch-surfing)?: No     Are you worried about losing your housing?: No   Physical Activity: Insufficiently Active (02/15/2024)    Exercise Vital Sign     Days of Exercise per Week: 2 days     Minutes of Exercise per Session: 30 min   Utilities: Low Risk (04/07/2024)    Utilities     Within the past 12 months, have you been unable to get utilities (heat, electricity) when it was really needed?: No   Stress: No Stress Concern Present (02/15/2024)    [employer] of Occupational Health - Occupational Stress Questionnaire     Feeling of Stress: Only a little   Interpersonal Safety: Not At Risk (06/01/2024)    Interpersonal Safety     Unsafe Where You Currently Live: No     Physically Hurt by Anyone: No     Abused by Anyone: No   Substance Use: Low Risk (02/15/2024)    Substance Use     In the past year, how often have you used prescription drugs for non-medical reasons?: Never     In the past year, how often have you used illegal drugs?: Never     In the past year, have you used any substance for non-medical reasons?: No  Social Connections: Socially Isolated (02/15/2024)    Social Connection and Isolation Panel     Frequency of Communication with Friends and Family: More than three times a week     Frequency of Social Gatherings with Friends and Family: Three times a week     Attends Religious Services: Never     Active Member of Clubs or Organizations: No     Attends Banker Meetings: Never     Marital Status: Never married   Health Literacy: Low Risk (02/15/2024)    Health Literacy     : Never   [5]    prochlorperazine   10 mg Intravenous Once   [6] [7] [8]   Allergies  Allergen Reactions    Ibuprofen Swelling     Other reaction(s): SWELLING/EDEMA    Sulfa  (Sulfonamide Antibiotics)

## 2024-06-01 NOTE — ED Provider Notes (Signed)
 [facility]  Emergency Department Provider Note     ED Clinical Impression     Final diagnoses:   Hypertension, unspecified type   Acute nonintractable headache, unspecified headache type (Primary)        Impression, Medical Decision Making, ED Course     Impression: 48 y.o. female with history of ESRD, kidney transplant (2018, on immunosuppression), and hypertension, who presents with measured hypertension to 181/105 this morning, as well as a few days of left sided headache, with associated left ear pain and bilateral blurry vision. This is in the setting of recent medications 1-2 weeks ago: she was taken off 10 mg amlodipine  and chlorthalidone  25 mg, and was started on losartan .      Vitals are hemodynamically stable and afebrile. On exam, the patient is in no acute distress. No left sided maxillary tenderness. Normal TM bilaterally. No obvious nasal congestion. No increased work of breathing.     DDx/MDM:     This patient's most likely diagnosis is likely hypertensive urgency with left-sided headache and blurry vision given new onset symptoms over days with elevated pressures and BP medication changes recently. Other diagnoses to consider include but not limited to tension HA, migraine, temporal arteritis, glaucoma, hypertensive retionpathy, PRES, ICH. Given no prior episodes of transient neurological symptoms and accompanying hypertension, did not consider autoimmune demyelinating etiologies of blurry vision such as MS. Will obtain CT head without contrast to rule out ICH or intracranial abnormality causing blurry vision. Other workup as listed below.    Will treat patient with IV LR and headache cocktail with Compazine  and Tylenol . Disposition likely discharge, but will pend disposition for pending workup.    More detailed discussion of differential diagnoses has been generated below based on HPI interview, discussion with patient of possible diagnoses and next steps, and clinical decision-making shared with patient.        Orders Placed This Encounter   Procedures    Urine Culture    Respiratory Pathogen Panel with COVID (Nasopharyngeal)    XR Chest Portable    ED POCUS    CT head WO contrast    CBC w/ Differential    Comprehensive Metabolic Panel    Urinalysis with Microscopy    Magnesium     HS Troponin 0h    Sedimentation rate, manual    C-reactive protein    ED Extra Tubes    Potassium Level    BUN    Creatinine    Ambulatory referral to Neurology    Visual acuity screening    ECG 12 Lead       ED Course as of 06/02/24 2107   Sun Jun 01, 2024   1243 XR Chest Portable  Cardiomegaly with pulmonary vascular congestion and mild interstitial pulmonary edema.     1243 Sedimentation rate, manual(!):    Sed Rate 42(!)   1243 HS Troponin 0h:    hsTroponin I <3   1252 CT head WO contrast  IMPRESSION:  No acute intracranial pathology.     Mildly expanded partially empty sella is nonspecific but can be seen in the setting of idiopathic intracranial hypertension.         Independent Interpretation of Studies: I have independently interpreted the following studies:  As above. See ED course for interpretation of studies by resident under supervision of attending provider attested separately.    Discussion of Management With Other Providers or Support Staff: I discussed the management of this patient with the:  Patient was discussed  with provider with attestation signed separately on duty on this date.  Attending agrees with discharge.    Considerations Regarding Disposition/Escalation of Care and Critical Care:  Patient does not require escalation of care at this time.    ____________________________________________    The case was discussed with the attending physician, who is in agreement with the above assessment and plan.      History     Chief Complaint  Chief Complaint   Patient presents with    Hypertension       HPI   Gina Hart is a 48 y.o. female with history of ESRD, kidney transplant (2018, on immunosuppression), and hypertension who presents with 1 episode of measured hypertension (181/105) this morning. The patient reports after taking her losartan , metoprolol , and ASA this morning, her blood pressure was elevated 181/105. This is above her typical range. She recently had medication changes 1-2 weeks ago 2/2 some hypokalemia: she was taken off 10 mg amlodipine  and chlorthalidone  25 mg, and was started on losartan . Upon interview, she also endorses a few days of left sided headache behind the left eye, with associated bilateral blurry vision and left ear pain. She further reports some frequent urination, but denies cloudy or malodorous urine. She does not have headaches or migraines at baseline, stating she typically experiences headaches only when her BP is high. Of note, the patient has a prescription for meclizine 2/2 an episode of room-spinning dizziness around 3 weeks ago. She denies dizziness today. Denies nausea, emesis, abdominal pain, chest pain, fever    Outside Historian(s): I have obtained additional history/collateral from family member if present at bedside at time of interview.    External Records Reviewed: I have reviewed external records from outpatient encounters.    Past Medical History[1]    Past Surgical History[2]    No current facility-administered medications for this encounter.    Current Outpatient Medications:     acetaminophen  (TYLENOL ) 325 MG tablet, Take 1-2 tablets (325-650 mg total) by mouth every four (4) hours as needed for pain., Disp: 100 tablet, Rfl: 0    albuterol  HFA 90 mcg/actuation inhaler, INHALE TWO PUFFS BY MOUTH EVERY 6 HOURS AS NEEDED FOR WHEEZING (Patient not taking: Reported on 04/30/2024), Disp: 8.5 g, Rfl: 0    amlodipine  (NORVASC ) 2.5 MG tablet, Take 3 tablets (7.5 mg total) by mouth daily., Disp: 90 tablet, Rfl: 11    aspirin  (ECOTRIN) 81 MG tablet, Take 1 tablet (81 mg total) by mouth daily., Disp: , Rfl:     biotin  5 mg cap, Take 1 capsule (5,000 mcg total) by mouth daily., Disp: 30 capsule, Rfl: 11    budesonide -formoterol  (SYMBICORT ) 160-4.5 mcg/actuation inhaler, 2 puff with spacer bid, Disp: 10.2 g, Rfl: 11    chlorthalidone  (HYGROTON ) 25 MG tablet, Take 1 tablet (25 mg total) by mouth every morning., Disp: 90 tablet, Rfl: 3    cholecalciferol , vitamin D3-50 mcg, 2,000 unit,, 50 mcg (2,000 unit) cap, Take 1 capsule (50 mcg total) by mouth in the morning., Disp: 30 capsule, Rfl: 11    cycloSPORINE  modified (NEORAL ) 25 MG capsule, Take 4 capsules (100 mg total) by mouth two (2) times a day., Disp: 240 capsule, Rfl: 11    ferrous sulfate  325 (65 FE) MG tablet, Take 1 tablet (325 mg total) by mouth two (2) times a day., Disp: 60 tablet, Rfl: 11    fluticasone  propionate (FLONASE ) 50 mcg/actuation nasal spray, 1 spray by Each Nare route daily., Disp: 16 g, Rfl: 4  loratadine  (CLARITIN ) 10 mg tablet, Take 1 tablet (10 mg total) by mouth daily as needed for allergies., Disp: , Rfl:     losartan  (COZAAR ) 25 MG tablet, Take 2 tablets (50 mg total) by mouth two (2) times a day., Disp: 120 tablet, Rfl: 11    magnesium  oxide (MAG-OX) 400 mg (241.3 mg elemental magnesium ) tablet, Take 1 tablet (400 mg total) by mouth daily., Disp: 30 tablet, Rfl: 11    meclizine (ANTIVERT) 25 mg tablet, Take 1 tablet (25 mg total) by mouth as needed., Disp: , Rfl:     melatonin 5 mg tablet, Take 1 tablet (5 mg total) by mouth nightly as needed., Disp: 30 tablet, Rfl: 11    metoPROLOL  tartrate (LOPRESSOR ) 100 MG tablet, TAKE ONE TABLET TWICE DAILY, Disp: 180 tablet, Rfl: 3    nebulizer and compressor Devi, COMPRESSOR NEBULIZER, Disp: , Rfl:     polyethylene glycol (GLYCOLAX ) 17 gram/dose powder, Take 17 g by mouth continuous as needed., Disp: , Rfl:     potassium chloride  20 MEQ ER tablet, Take 2 tablets (40 mEq total) by mouth two (2) times a day., Disp: 120 tablet, Rfl: 11    predniSONE  (DELTASONE ) 5 MG tablet, TAKE ONE TABLET BY MOUTH ONCE DAILY, Disp: 30 tablet, Rfl: 11    rosuvastatin  (CRESTOR ) 5 MG tablet, TAKE ONE TABLET BY MOUTH ONCE DAILY, Disp: 90 tablet, Rfl: 3    Allergies  Ibuprofen and Sulfa  (sulfonamide antibiotics)    Family History  Family History[3]    Social History  Short Social History[4]     Physical Exam     VITAL SIGNS:      Vitals:    06/01/24 1800 06/01/24 1900 06/01/24 2000 06/01/24 2059   BP: 144/84 161/91 166/85 151/87   Pulse: 77 78 82 88   Resp: 16 29 24 28    Temp:    36.6 ??C (97.9 ??F)   TempSrc:    Oral   SpO2: 97% 97% 96% 96%   Weight:           Constitutional: Alert and oriented. No acute distress.  Eyes: Conjunctivae are normal.  HEENT: Normocephalic and atraumatic. Conjunctivae clear. No congestion. Moist mucous membranes. No left sided maxillary tenderness. Normal TM Bilaterally.   Cardiovascular: Rate as above, regular rhythm. Normal and symmetric distal pulses. Brisk capillary refill. Normal skin turgor.  Respiratory: Normal respiratory effort. Breath sounds are normal. There are no wheezing or crackles heard.  Gastrointestinal: Soft, non-distended, non-tender.  Genitourinary: Deferred.  Musculoskeletal: Non-tender with normal range of motion in all extremities.  Neurologic: Normal speech and language. No gross focal neurologic deficits are appreciated. Patient is moving all extremities equally, face is symmetric at rest and with speech.  Skin: Skin is warm, dry and intact. No rash noted.  Psychiatric: Mood and affect are normal. Speech and behavior are normal.     Radiology     CT head WO contrast   Final Result   No acute intracranial pathology.      Mildly expanded partially empty sella is nonspecific but can be seen in the setting of idiopathic intracranial hypertension.            ED POCUS         XR Chest Portable   Final Result      Cardiomegaly with pulmonary vascular congestion and mild interstitial pulmonary edema.                Pertinent labs & imaging results that  were available during my care of the patient were independently interpreted by me and considered in my medical decision making (see chart for details).    Portions of this record have been created using Scientist, clinical (histocompatibility and immunogenetics). Dictation errors have been sought, but may not have been identified and corrected.    Documentation assistance was provided by Virgel Pottier, Scribe on June 01, 2024 at 8:51 AM for Rodrick Sills, MD.    Documentation assistance was provided by the scribe in my presence.  The documentation recorded by the scribe has been reviewed by me and accurately reflects the services I personally performed.      Rodrick Sills, MD  [facility], PGY-1         [1]   Past Medical History:  Diagnosis Date    ESRD (end stage renal disease) (CMS-HCC)     Focal segmental glomerulosclerosis 1998    Hypertension     Pulmonary hypertension    (CMS-HCC)     Scoliosis (and kyphoscoliosis), idiopathic    [2]   Past Surgical History:  Procedure Laterality Date    BACK SURGERY      CHG US  GUIDE, VASCULAR ACCESS Right 07/11/2013    Procedure: ULTRASOUND GUIDANCE FOR VASC ACCESS REQUIRING US  EVAL OF POTENTIAL ACCESS SITES;  Surgeon: Fairy JINNY Moons, MD;  Location: MAIN OR [facility]  Service: Vascular    PR AV ANAST,FOREARM VEIN TRANSPOSITION Right 07/11/2013    Procedure: R. UE BRACHIOCEPHALIC FISTULA;  Surgeon: Fairy JINNY Moons, MD;  Location: MAIN OR [facility];  Service: Vascular    PR AV ANAST,UP ARM BASILIC VEIN TRANSPOSIT Right 07/11/2013    Procedure: BASILIC VEIN TRANSPOSITION;  Surgeon: Fairy JINNY Moons, MD;  Location: MAIN OR [facility];  Service: Vascular    PR RIGHT HEART CATH O2 SATURATION & CARDIAC OUTPUT N/A 06/16/2015    Procedure: Right Heart Catheterization;  Surgeon: Zachary Prentice Car, MD;  Location: [facility];  Service: Cardiology    PR TRANSPLANTATION OF KIDNEY Right 01/11/2017    Procedure: RENAL ALLOTRANSPLANTATION, IMPLANTATION OF GRAFT; WITHOUT RECIPIENT NEPHRECTOMY;  Surgeon: Marsa Sam Boss, MD;  Location: MAIN OR [facility];  Service: Transplant   [3]   Family History  Problem Relation Age of Onset    Hypertension Mother     No Known Problems Father     No Known Problems Sister     No Known Problems Daughter     No Known Problems Maternal Grandmother     No Known Problems Maternal Grandfather     No Known Problems Paternal Grandmother     No Known Problems Paternal Grandfather     BRCA 1/2 Neg Hx     Breast cancer Neg Hx     Cancer Neg Hx     Colon cancer Neg Hx     Endometrial cancer Neg Hx     Ovarian cancer Neg Hx     Melanoma Neg Hx     Basal cell carcinoma Neg Hx     Squamous cell carcinoma Neg Hx    [4]   Social History  Tobacco Use    Smoking status: Never    Smokeless tobacco: Never   Vaping Use    Vaping status: Never Used   Substance Use Topics    Alcohol use: No    Drug use: No        Sills Rodrick BROCKS, MD  Resident  06/02/24 2109

## 2024-06-02 DIAGNOSIS — Z940 Kidney transplant status: Principal | ICD-10-CM

## 2024-06-02 MED ORDER — LOSARTAN 25 MG TABLET
ORAL_TABLET | Freq: Two times a day (BID) | ORAL | 11 refills | 30.00000 days | Status: CP
Start: 2024-06-02 — End: 2025-06-02

## 2024-06-02 MED ADMIN — acetaminophen (OFIRMEV) 10 mg/mL injection 1,000 mg: 1000 mg | INTRAVENOUS | @ 01:00:00 | Stop: 2024-06-01

## 2024-06-02 NOTE — Telephone Encounter (Addendum)
 Returned patient's call.  She was seen in ED over the weekend for BP's in 180's/100's, headache, blurred vision x 5 days.  She states that she was instructed to reach out to her Transplant team as they did not feel comfortable making any changes at that time to her medications.  Spoke with Dr Macie, who recommended increasing Losartan  to 50 mg BID.  Notified patient who is agreeable with this plan.  Script updated and sent to local pharmacy.

## 2024-06-03 MED ORDER — CEPHALEXIN 500 MG CAPSULE
ORAL_CAPSULE | Freq: Three times a day (TID) | ORAL | 0 refills | 5.00000 days | Status: CP
Start: 2024-06-03 — End: 2024-06-08

## 2024-06-03 NOTE — Progress Notes (Signed)
 Patient with a positive urine culture. Patient was not started on antibiotics prior to dispo from ED. EMAP messaged with results.     Status: Final result    Test Result Released: Yes (not seen)    Specimen Information: Clean Catch; Urine   0 Result Notes  Urine Culture, Comprehensive 10,000 to 50,000 CFU/mL Escherichia coli Abnormal    Susceptibility Testing By Consultation Only      Specimen Source: Clean Catch        Resulting Agency: [facility]          Specimen Collected: 06/01/24 10:47 Last Resulted: 06/02/24 15:48

## 2024-06-03 NOTE — ED Progress Note (Signed)
 EMAP test result follow-up note    June 03, 2024 4:16 PM     I was contacted by the ED resource nurse Merri Molt) with regard to [employer]. She was seen in the [facility] Emergency Department on 06/01/24 at which time a clean catch urine culture was done which has returned with the following result:    Urine Culture, Comprehensive 10,000 to 50,000 CFU/mL Escherichia coli - Abnormal    Susceptibility Testing By Consultation Only      Specimen Source: Clean Catch        Resulting Agency: [facility]     Specimen Collected: 06/01/24 10:47 Last Resulted: 06/02/24 15:48     I have reviewed the patient's chart from this ED visit. She is a 48 year-old female with history of kidney transplant who was evaluated for an elevated home blood pressure reading with associated headache. She reported some frequent urination but denied any cloudy or malodorous urine. Urinalysis showed negative leukocyte esterase and nitrite, 1 WBC, 2 RBC and <1 squamous epithelial cell with no bacteria. No antibiotics were administered during her ED visit or prescribed at the time of discharge. Urine culture has since returned with the findings documented above. Renal function was normal with BUN 13, Cr 0.72. There was no leukocytosis on CBC.    Although clinical picture is suggestive of a possible contaminant, given the patient's kidney transplant status and urinary frequency will treat for cystitis. Clinical picture is not suggestive of pyelonephritis involving the transplanted kidney (soft, nontender abdomen on exam). The ED resource nurse will contact the patient, inform her of the positive test result(s) and notify her that a course of cephalexin  500 mg by mouth three times-daily x 5 days has been prescribed to her pharmacy on record [facility] [employer] in [place]). The patient will be instructed to pick up and start taking this medication as soon as possible. She should also be instructed to contact her nephrologist to notify them of this course of treatment and schedule follow-up within the next few weeks. Return precautions should be reviewed and she should otherwise be instructed to follow-up as directed at the time of her ED visit.

## 2024-06-04 ENCOUNTER — Encounter: Admit: 2024-06-04 | Discharge: 2024-06-04 | Payer: Medicare (Managed Care)

## 2024-06-04 DIAGNOSIS — Z940 Kidney transplant status: Principal | ICD-10-CM

## 2024-06-04 LAB — BASIC METABOLIC PANEL
ANION GAP: 8 mmol/L (ref 3–11)
BLOOD UREA NITROGEN: 19 mg/dL (ref 8–20)
BUN / CREAT RATIO: 18
CALCIUM: 10 mg/dL (ref 8.5–10.1)
CHLORIDE: 102 mmol/L (ref 98–107)
CO2: 25.6 mmol/L (ref 21.0–32.0)
CREATININE: 1.05 mg/dL (ref 0.60–1.10)
EGFR CKD-EPI (2021) FEMALE: 65 mL/min/1.73m2 (ref >=60)
GLUCOSE RANDOM: 94 mg/dL (ref 70–179)
POTASSIUM: 5 mmol/L (ref 3.5–5.0)
SODIUM: 136 mmol/L (ref 135–145)

## 2024-06-04 MED ORDER — PROCHLORPERAZINE MALEATE 10 MG TABLET
ORAL_TABLET | Freq: Three times a day (TID) | ORAL | 0 refills | 7.00000 days | Status: CP | PRN
Start: 2024-06-04 — End: ?

## 2024-06-04 MED ORDER — AMLODIPINE 2.5 MG TABLET
ORAL_TABLET | Freq: Every day | ORAL | 3 refills | 30.00000 days | Status: CP
Start: 2024-06-04 — End: 2025-06-04

## 2024-06-04 NOTE — Patient Instructions (Addendum)
 Let's add back your amlodipine  at just 5mg  daily to see if we can avoid the swelling you were having at 7.5mg . KEEP taking losartan  50mg  twice daily. Keep taking metoprolol  100mg  twice daily.  I am sending in a limited supply of compazine  which is what they gave you for your headache in the ER that helped.   Let Dr. Macie know we made that change to your blood pressure medication.   I'll see you back in a few weeks.     Gina Con Gasman, MD  [facility] at [facility]

## 2024-06-04 NOTE — Progress Notes (Signed)
 Assessment/Plan:     Assessment & Plan  Hypertension  Blood pressure remains uncontrolled despite recent medication adjustments. Losartan  recently increased. Amlodipine  reintroduced at a lower dose to manage blood pressure without causing swelling.  - Restart amlodipine  but at just 5 mg daily.  - Continue losartan  50 mg twice daily.  - Continue metoprolol  100 mg twice daily.  - BMP today  - She will inform Dr. Levonne office (her transplant nephrology team) about medication changes.    Headache  Headache associated with elevated blood pressure. Possible viral etiology considered due to concurrent symptoms. Reassuringly has had negative head imaging and ophthalmology assessment.   - Prescribed Compazine  for headache management.  - Advised caution with Compazine  due to potential drowsiness.     Assessment & Plan      No follow-ups on file.     Subjective:     Chief Complaint: No chief complaint on file.       History of Present Illness  Gina Hart is a 48 year old female with hypertension who presents with persistent headaches and elevated blood pressure.    Hypertension  - Persistent elevated blood pressure despite losartan  50 mg twice daily and metoprolol  100 mg twice daily  - Recent ER visit for markedly elevated blood pressure; received IV hydralazine with temporary improvement, but blood pressure increased again after discharge  - Unable to tolerate chlorthalidone  or amlodipine  due to swelling; both medications discontinued    Headache and associated neurological symptoms  - Persistent pain behind the left eye and in the left ear  - Severe headache treated in ER with temporary relief  - No ongoing pain medication provided after ER visit  - Blurry vision present    Gastrointestinal and constitutional symptoms  - Decreased appetite  - Loose stools attributed to a recent viral illness       Objective:     Physical Exam       Physical Exam  Vitals reviewed.   Constitutional:       General: She is not in acute distress.  HENT:      Mouth/Throat:      Mouth: Mucous membranes are moist.   Pulmonary:      Effort: Pulmonary effort is normal. No respiratory distress.   Abdominal:      General: There is no distension.   Musculoskeletal:         General: No swelling or deformity.   Neurological:      General: No focal deficit present.   Psychiatric:         Mood and Affect: Mood normal.        Visit Vitals  BP 156/87   Pulse 65   Temp 36.6 ??C (97.9 ??F) (Temporal)   Resp 16   Ht 149.9 cm (4' 11)   Wt 93.4 kg (206 lb)   LMP 04/30/2016   SpO2 97%   BMI 41.61 kg/m??     BP Readings from Last 3 Encounters:   06/04/24 156/87   06/01/24 151/87   05/09/24 118/72     Wt Readings from Last 3 Encounters:   06/04/24 93.4 kg (206 lb)   06/01/24 94.6 kg (208 lb 9.6 oz)   05/09/24 94.1 kg (207 lb 6.4 oz)

## 2024-06-04 NOTE — Progress Notes (Signed)
 Venipuncture performed.  Drawn from left upper arm 2nd attempt 1st attempt left hand unsuccessful .  Bandage applied.  Pt tolerated well.

## 2024-06-10 DIAGNOSIS — Z940 Kidney transplant status: Principal | ICD-10-CM

## 2024-06-10 LAB — CBC W/ DIFFERENTIAL
BASOPHILS ABSOLUTE COUNT: 0 10^3/ul — ABNORMAL LOW (ref 0.01–0.09)
BASOPHILS RELATIVE PERCENT: 0.6 % (ref 0.0–2.0)
EOSINOPHILS ABSOLUTE COUNT: 0.1
EOSINOPHILS RELATIVE PERCENT: 3.3 % (ref 1.0–8.0)
HEMATOCRIT: 39.6 (ref 35–48)
HEMOGLOBIN: 13.3 (ref 11.9–16.0)
LYMPHOCYTES ABSOLUTE COUNT: 0.9 uL — ABNORMAL LOW (ref 1.0–2.7)
LYMPHOCYTES RELATIVE PERCENT: 21.4 % (ref 20–45)
MEAN CORPUSCULAR HEMOGLOBIN CONC: 33.7 (ref 33.3–35.5)
MEAN CORPUSCULAR HEMOGLOBIN: 29.5 pg (ref 25.8–33.0)
MEAN CORPUSCULAR VOLUME: 87.6 fl (ref 78–95)
MEAN PLATELET VOLUME: 8 fl (ref 7.5–11.2)
MONOCYTES ABSOLUTE COUNT: 0.7 10^3/uL (ref 0.2–0.8)
MONOCYTES RELATIVE PERCENT: 16.7 % — ABNORMAL HIGH (ref 4.0–12.0)
NEUTROPHILS ABSOLUTE COUNT: 2.3 (ref 1.7–6.7)
NEUTROPHILS RELATIVE PERCENT: 58 % (ref 42–74)
PLATELET COUNT: 250 uL (ref 150–480)
RED BLOOD CELL COUNT: 4.5 (ref 4.0–5.3)
RED CELL DISTRIBUTION WIDTH: 13.7 % (ref 12.1–15.0)
WHITE BLOOD CELL COUNT: 4 (ref 4.0–10.5)

## 2024-06-10 LAB — URINALYSIS WITH MICROSCOPY
BILIRUBIN UA: NEGATIVE
BLOOD UA: NEGATIVE
GLUCOSE UA: NEGATIVE
KETONES UA: NEGATIVE
NITRITE UA: NEGATIVE
PH UA: 6 (ref 4.5–8)
PROTEIN UA: NEGATIVE
SPECIFIC GRAVITY UA: 1.015 (ref 1.005–1.030)
UROBILINOGEN UA: 0.2

## 2024-06-10 LAB — BASIC METABOLIC PANEL
ANION GAP: 7.6 (ref 2.0–15.0)
BLOOD UREA NITROGEN: 22 — ABNORMAL HIGH (ref 0.6–1.3)
BUN / CREAT RATIO: 24.4 — ABNORMAL HIGH
CALCIUM: 9.8 (ref 8.5–10.1)
CHLORIDE: 102 (ref 100–108)
CO2: 27.4 mmol/L (ref 21–32)
CREATININE: 0.9
EGFR CKD-EPI AA FEMALE: 78
GLUCOSE RANDOM: 85 (ref 65–99)
MAGNESIUM: 1.8 (ref 1.8–2.4)
OSMOLALITY CALCULATION: 286 — ABNORMAL LOW
PHOSPHORUS: 3.7 (ref 2.6–4.7)
POTASSIUM: 4.4 (ref 3.5–5.1)
SODIUM: 137 mmol/L (ref 136–145)

## 2024-06-10 LAB — PROTEIN / CREATININE RATIO, URINE
CREATININE, URINE: 58.2
PROTEIN URINE: 9.9
PROTEIN/CREAT RATIO, URINE: 170 mg/g (ref 0–200)

## 2024-06-11 DIAGNOSIS — Z940 Kidney transplant status: Principal | ICD-10-CM

## 2024-06-11 MED ORDER — CARVEDILOL 12.5 MG TABLET
ORAL_TABLET | Freq: Two times a day (BID) | ORAL | 3 refills | 100.00000 days | Status: CP
Start: 2024-06-11 — End: 2025-07-16

## 2024-06-11 NOTE — Telephone Encounter (Addendum)
 Called patient regarding elevated blood pressure today.  She reports BP's of 150's/90's throughout the day.  Per Dr Macie, patient should STOP Metoprolol  and instead START Carvedilol  12.5 mg BID.      Advised patient of this who verbalized understanding.  Script sent to local pharmacy per patient's request.

## 2024-06-11 NOTE — Telephone Encounter (Signed)
 Patient paged on call.  Reports that her BP was 152/95 this AM. She did not check her BP yesterday.  Confirmed that she was taking the correct medications.  Reminded her to check in her BP AM/PM EVERYDAYY.    Let her know I would talk to her TNC and we will call her back.    Updated her primary TNC who will follow up

## 2024-06-16 DIAGNOSIS — Z940 Kidney transplant status: Principal | ICD-10-CM

## 2024-06-16 LAB — CYCLOSPORINE LEVEL: CYCLOSPORINE (FPIA) BLOOD: 107 ng/mL (ref 100–400)

## 2024-06-16 NOTE — Telephone Encounter (Signed)
 Patient paged on call to check the dose of her coreg  medication. I told her to take coreg  12.5mg  tabs 1 bid

## 2024-06-16 NOTE — Telephone Encounter (Signed)
 Spoke with patient.  She is confused about how she should be taking Carvedilol  and reports that she took 2 tablets in the morning.  Advised to take 1 tablet BID going forward and continue to monitor BP.  She reports current BP's of 140/80.  She will continue to check and notify TNC if numbers do not improve by the end of the week.

## 2024-06-17 DIAGNOSIS — R0981 Nasal congestion: Principal | ICD-10-CM

## 2024-06-17 DIAGNOSIS — B009 Herpesviral infection, unspecified: Principal | ICD-10-CM

## 2024-06-17 MED ORDER — ACYCLOVIR 5 % TOPICAL OINTMENT
TOPICAL | 0 refills | 6.00000 days | Status: CP
Start: 2024-06-17 — End: 2024-06-21

## 2024-06-17 MED ORDER — GUAIFENESIN ER 600 MG TABLET, EXTENDED RELEASE 12 HR
ORAL_TABLET | Freq: Two times a day (BID) | ORAL | 0 refills | 7.00000 days | Status: CP
Start: 2024-06-17 — End: 2024-06-24

## 2024-06-17 NOTE — Progress Notes (Addendum)
 Assessment/Plan:     Assessment & Plan  Nasal congestion and upper respiratory symptoms  Symptoms persist despite nasal saline rinse which patient reports was ineffective due to deviated septum. Current management includes Flonase  and allergy pills. Saline rinse ineffective due to blockage. Humidifier use ongoing.   - Prescribed Mucinex  for up to 7 days.  - Recommended nasal saline spray for congestion relief.  - Continue Flonase  daily  - Advised staying hydrated   - Encouraged blowing nose as needed.    Herpes simplex   Recurrent infection with crusty sores, triggered by frequent wiping during colds. Previous treatment with generic Zovirax  ointment effective.  - Prescribed generic Zovirax  ointment, apply five times a day for 4 days.    Lymphedema  Patient has no history of lymphedema which is being managed by Dr. Marlee, PCP.  She continues to have the need for lymphedema specific therapy.  - Continue with compression therapy as prescribed        No follow-ups on file.     Subjective:     Chief Complaint: Congestion (Started sneezing 1 week ago, only symptoms now is stuffy nose, sores on outside of nose (patient states that is typical for her). )       History of Present Illness  Gina Hart is a 48 year old female who presents with nasal congestion and crusting of skin under her nose. She has a hx of HTN, pulmonary HTN, hx of kidney transplant.     She has been experiencing nasal congestion and crusting for about a week, initially presenting with sneezing and rhinorrhea, which progressed to a sensation of nasal obstruction and congestion. She describes the congestion as 'stopped up' and 'really congested'.    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 Additionally, she has a deviated septum, with plans for corrective surgery next year.    She experiences herpes sores in her nose when frequently wiping it during colds, which become crusty. She applies a generic form of Zovirax  ointment topically three times a day to manage these sores.    She uses Flonase  nasal spray daily and takes allergy medication, though she cannot recall the specific name. She has attempted saline rinses, which were ineffective as the solution would not pass through her nasal passages. She also uses a humidifier to alleviate her symptoms.    She reports coughing up small flecks of yellow phlegm and notes that her nasal discharge was yellow when her nose was runny. No fever.         ROS  Negative unless notated in HPI    Objective:   Physical Exam  Constitutional:       General: She is not in acute distress.  HENT:      Right Ear: Tympanic membrane normal.      Left Ear: Tympanic membrane normal.      Ears:      Comments: Copious cerumen      Nose: Congestion present.      Mouth/Throat:      Mouth: Mucous membranes are moist.      Pharynx: Oropharynx is clear.   Eyes:      Extraocular Movements: Extraocular movements intact.      Conjunctiva/sclera: Conjunctivae normal.      Pupils: Pupils are equal, round, and reactive to light.   Cardiovascular:      Rate and Rhythm: Normal rate and regular rhythm.  Heart sounds: Normal heart sounds.   Pulmonary:      Effort: Pulmonary effort is normal.      Breath sounds: Normal breath sounds.   Skin:     Comments: Small patch of crusted skin under the nostrils        Visit Vitals  BP 126/83 (BP Site: L Arm, BP Position: Sitting, BP Cuff Size: Large)   Pulse 83   Temp 36.7 ??C (98.1 ??F) (Temporal)   Resp 18   Ht 149.9 cm (4' 11)   Wt 94.7 kg (208 lb 12.8 oz)   LMP 04/30/2016   SpO2 96%   BMI 42.17 kg/m??     BP Readings from Last 3 Encounters:   06/17/24 126/83   06/04/24 156/87   06/01/24 151/87     Wt Readings from Last 3 Encounters:   06/17/24 94.7 kg (208 lb 12.8 oz)   06/04/24 93.4 kg (206 lb)   06/01/24 94.6 kg (208 lb 9.6 oz)                            This note was created using Dragon/Abdridge dictation software and while every attempt has been made to minimize errors, some may still be   present.

## 2024-06-23 DIAGNOSIS — Z940 Kidney transplant status: Principal | ICD-10-CM

## 2024-06-23 NOTE — Telephone Encounter (Addendum)
 Spoke with patient about meds.  She confirms that she has actually been taking 100 mg Losartan  BID instead of 50 mg BID.  Her BP is 150's/80's.  She is also taking 12.5 mg Carvedilol  BID and Amlodipine  10 mg daily with minimal to no swelling.  She only has 2 Losartan  tablets left.      Spoke with Dr Macie who advised that patient should not be taking more than 50 mg Losartan  BID so this dose should not be increased.  Per Dr Macie, ok to increase Carvedilol  to 18.75 mg BID.  Script sent to [facility] per patient's request.  Patient encouraged to continue to monitor BP and follow up with Family Medicine about BP's at her appointment 1/13.

## 2024-06-24 DIAGNOSIS — Z940 Kidney transplant status: Principal | ICD-10-CM

## 2024-06-24 MED ORDER — LOSARTAN 50 MG TABLET
ORAL_TABLET | Freq: Two times a day (BID) | ORAL | 11 refills | 30.00000 days | Status: CP
Start: 2024-06-24 — End: 2025-06-24

## 2024-06-24 MED ORDER — CARVEDILOL 6.25 MG TABLET
ORAL_TABLET | Freq: Two times a day (BID) | ORAL | 11 refills | 30.00000 days | Status: CP
Start: 2024-06-24 — End: 2025-06-24

## 2024-06-24 NOTE — Addendum Note (Signed)
 Addended by: NICKOLA ROSINA SAILOR on: 06/24/2024 09:04 AM     Modules accepted: Orders

## 2024-06-30 DIAGNOSIS — Z940 Kidney transplant status: Principal | ICD-10-CM

## 2024-07-01 LAB — CBC W/ DIFFERENTIAL
BASOPHILS ABSOLUTE COUNT: 0.1
BASOPHILS RELATIVE PERCENT: 1.2
EOSINOPHILS ABSOLUTE COUNT: 0.1
EOSINOPHILS RELATIVE PERCENT: 2.6
HEMATOCRIT: 38.5
HEMOGLOBIN: 12.8
LYMPHOCYTES ABSOLUTE COUNT: 1.1
LYMPHOCYTES RELATIVE PERCENT: 22.4
MEAN CORPUSCULAR HEMOGLOBIN CONC: 33.2
MEAN CORPUSCULAR HEMOGLOBIN: 29
MEAN CORPUSCULAR VOLUME: 87.4
MEAN PLATELET VOLUME: 8.3
MONOCYTES ABSOLUTE COUNT: 0.5
MONOCYTES RELATIVE PERCENT: 11.2
NEUTROPHILS ABSOLUTE COUNT: 3
NEUTROPHILS RELATIVE PERCENT: 62.6
PLATELET COUNT: 253
RED BLOOD CELL COUNT: 4.41
RED CELL DISTRIBUTION WIDTH: 1
WHITE BLOOD CELL COUNT: 4.8

## 2024-07-01 LAB — BASIC METABOLIC PANEL
ANION GAP: 10.6
BLOOD UREA NITROGEN: 27
CALCIUM: 9.9
CHLORIDE: 103
CO2: 25.4
CREATININE: 1.16
GLUCOSE RANDOM: 83
MAGNESIUM: 1.9
OSMOLALITY CALCULATION: 282
PHOSPHORUS: 3.9
POTASSIUM: 4.5
SODIUM: 139

## 2024-07-01 LAB — URINALYSIS WITH MICROSCOPY
BILIRUBIN UA: NEGATIVE
BLOOD UA: NEGATIVE
GLUCOSE UA: NEGATIVE
KETONES UA: NEGATIVE
LEUKOCYTE ESTERASE UA: NEGATIVE
NITRITE UA: NEGATIVE
PH UA: 6
SPECIFIC GRAVITY UA: 1.015
UROBILINOGEN UA: 0.2

## 2024-07-01 LAB — PROTEIN / CREATININE RATIO, URINE
CREATININE, URINE: 51.94
PROTEIN/CREAT RATIO, URINE: 8.5

## 2024-07-02 LAB — BASIC METABOLIC PANEL
ANION GAP: 10.6 mmol/L (ref 2.0–15.0)
BLOOD UREA NITROGEN: 27 — ABNORMAL HIGH (ref 7–18)
BUN / CREAT RATIO: 22.5 — ABNORMAL HIGH
CALCIUM: 9.9 (ref 8.5–10.1)
CHLORIDE: 103 mmol/L (ref 100–108)
CO2: 25.4 (ref 21–32)
CREATININE: 1.2 (ref 0.6–1.3)
EGFR CKD-EPI AA FEMALE: 55
GLUCOSE RANDOM: 83 (ref 65–99)
MAGNESIUM: 1.9 (ref 1.8–2.4)
OSMOLALITY CALCULATION: 292
PHOSPHORUS: 3.9 (ref 2.6–4.7)
POTASSIUM: 4.5 mmol/L (ref 3.5–5.1)
SODIUM: 139 (ref 136–145)

## 2024-07-02 LAB — URINALYSIS WITH MICROSCOPY
BILIRUBIN UA: NEGATIVE
BLOOD UA: NEGATIVE
GLUCOSE UA: NEGATIVE
KETONES UA: NEGATIVE
LEUKOCYTE ESTERASE UA: NEGATIVE
NITRITE UA: NEGATIVE
PH UA: 6 (ref 4.5–8)
PROTEIN UA: NEGATIVE
SPECIFIC GRAVITY UA: 1.015 (ref 1.005–1.030)
UROBILINOGEN UA: 0.2

## 2024-07-02 LAB — PROTEIN / CREATININE RATIO, URINE
CREATININE, URINE: 51.9
PROTEIN URINE: 8.5
PROTEIN/CREAT RATIO, URINE: 164 mg/g (ref 0–200)

## 2024-07-04 DIAGNOSIS — Z940 Kidney transplant status: Principal | ICD-10-CM

## 2024-07-04 DIAGNOSIS — N390 Urinary tract infection, site not specified: Principal | ICD-10-CM

## 2024-07-04 MED ORDER — AMOXICILLIN 875 MG-POTASSIUM CLAVULANATE 125 MG TABLET
ORAL_TABLET | Freq: Two times a day (BID) | ORAL | 0 refills | 5.00000 days | Status: CP
Start: 2024-07-04 — End: 2024-07-09

## 2024-07-04 MED ORDER — FLUCONAZOLE 100 MG TABLET
ORAL_TABLET | Freq: Every day | ORAL | 0 refills | 1.00000 days | Status: CP
Start: 2024-07-04 — End: 2024-07-05

## 2024-07-04 MED ORDER — WEGOVY 0.25 MG/0.5 ML SUBCUTANEOUS PEN INJECTOR
SUBCUTANEOUS | 11 refills | 0.00000 days | Status: CP
Start: 2024-07-04 — End: 2025-07-04

## 2024-07-04 NOTE — Telephone Encounter (Signed)
 Reviewed positive urine culture Escherichia Coli 50,000-60,000 col/mL with patient, denies symptoms at this time but would like to treat due to repeated culture from last month. Per Dr. Macie - start Augmentin  875 mg BID x5 days. Also, pt has new insurance and requested to resend Wegovy  to test if it will be covered.

## 2024-07-07 DIAGNOSIS — Z940 Kidney transplant status: Principal | ICD-10-CM

## 2024-07-07 DIAGNOSIS — N390 Urinary tract infection, site not specified: Principal | ICD-10-CM

## 2024-07-07 MED ORDER — WEGOVY 0.25 MG/0.5 ML SUBCUTANEOUS PEN INJECTOR
SUBCUTANEOUS | 11 refills | 0.00000 days | Status: CP
Start: 2024-07-07 — End: 2025-07-07

## 2024-07-07 NOTE — Telephone Encounter (Signed)
 Spoke with patient about insurance now possibly covering Wegovy .  Discussed that able to confirm prior coordinator sent in script.  Patient provided [facility] Delivery number to call and advised to reach out to them for cost confirmation.  Patient also expresses she may be interested in gastric sleeve if this is not covered.  She will update TNC accordingly.

## 2024-07-08 DIAGNOSIS — N390 Urinary tract infection, site not specified: Principal | ICD-10-CM

## 2024-07-08 DIAGNOSIS — Z940 Kidney transplant status: Principal | ICD-10-CM

## 2024-07-08 LAB — CYCLOSPORINE LEVEL: CYCLOSPORINE (FPIA) BLOOD: 159 ng/mL (ref 100–400)

## 2024-07-09 ENCOUNTER — Encounter: Admit: 2024-07-09 | Discharge: 2024-07-09 | Payer: Medicare (Managed Care)

## 2024-07-09 DIAGNOSIS — Z114 Encounter for screening for human immunodeficiency virus [HIV]: Principal | ICD-10-CM

## 2024-07-09 DIAGNOSIS — Z124 Encounter for screening for malignant neoplasm of cervix: Principal | ICD-10-CM

## 2024-07-09 DIAGNOSIS — Z1159 Encounter for screening for other viral diseases: Principal | ICD-10-CM

## 2024-07-09 DIAGNOSIS — Z113 Encounter for screening for infections with a predominantly sexual mode of transmission: Principal | ICD-10-CM

## 2024-07-09 NOTE — Progress Notes (Signed)
 Assessment/Plan:     Assessment & Plan  Woman's Wellness Visit  Here for Pap Smear, also desires screening for STIs.  - Performed Pap smear.  - Ordered STD testing     Assessment & Plan  Screen for sexually transmitted diseases  Cervical cancer screening  Need for hepatitis C screening test  Screening for HIV (human immunodeficiency virus)      Orders:    Chlamydia/Gonorrhoeae NAA; Future    TRICHOMONAS NAA    Pap Test    Syphilis Screen; Future    Hypertension, unspecified type  At our last visit we cut back on her amlodipine  to just 5mg  daily (had LE edema on 7.5mg  daily). Her transplant nephrologist recently added Coreg  18.75mg  BID which seems to have conferred excellent benefit. BP is quite well-controlled today.  - Continue Amlodipine  5mg  daily, Coreg  18.75 mg BID, losartan  50mg  daily,   - Has ongoing regular follow-up with nephrology         No follow-ups on file.     Subjective:     Chief Complaint: Gynecologic Exam       History of Present Illness  The patient presents for a Pap smear and blood pressure follow-up  Hypertension  - Blood pressure currently well controlled on carvedilol  prescribed by nephrology         Objective:     Physical Exam       Physical Exam  Exam conducted with a chaperone present.   Constitutional:       General: She is not in acute distress.  Cardiovascular:      Rate and Rhythm: Normal rate and regular rhythm.   Pulmonary:      Effort: Pulmonary effort is normal.   Abdominal:      General: There is no distension.   Genitourinary:     General: Normal vulva.      Vagina: Normal.      Cervix: Normal.      Comments: Minimal cervical bleeding with cytobrush contact  Musculoskeletal:         General: No swelling or deformity.        Visit Vitals  BP 106/70   Pulse 78   Temp 36.7 ??C (98 ??F) (Temporal)   Resp 16   Ht 149.9 cm (4' 11)   Wt 93.7 kg (206 lb 9.6 oz)   LMP 04/30/2016   SpO2 97%   BMI 41.73 kg/m??     BP Readings from Last 3 Encounters:   07/09/24 106/70   06/17/24 126/83 06/04/24 156/87     Wt Readings from Last 3 Encounters:   07/09/24 93.7 kg (206 lb 9.6 oz)   06/17/24 94.7 kg (208 lb 12.8 oz)   06/04/24 93.4 kg (206 lb)

## 2024-07-09 NOTE — Progress Notes (Signed)
 Venipuncture performed.  Drawn from left hand.  Bandage applied.  Pt tolerated well.

## 2024-07-10 LAB — SYPHILIS SCREEN: SYPHILIS RPR SCREEN: NONREACTIVE

## 2024-07-16 DIAGNOSIS — Z940 Kidney transplant status: Principal | ICD-10-CM

## 2024-07-17 NOTE — Telephone Encounter (Signed)
 Copied from CRM #[id number]. Topic: Access To Clinicians - [facility] Call Back  >> Jul 17, 2024  8:05 AM Sharlet HERO wrote:  Pt requesting a call back regarding her labs and  she also has a question about a prescription as well.    Please contact pt and advise.

## 2024-07-17 NOTE — Telephone Encounter (Signed)
 Patient notified labs have not yet been reviewed, routing to provider.

## 2024-07-17 NOTE — Assessment & Plan Note (Signed)
 At our last visit we cut back on her amlodipine  to just 5mg  daily (had LE edema on 7.5mg  daily). Her transplant nephrologist recently added Coreg  18.75mg  BID which seems to have conferred excellent benefit. BP is quite well-controlled today.  - Continue Amlodipine  5mg  daily, Coreg  18.75 mg BID, losartan  50mg  daily,   - Has ongoing regular follow-up with nephrology

## 2024-07-28 DIAGNOSIS — Z940 Kidney transplant status: Principal | ICD-10-CM

## 2024-07-29 NOTE — Progress Notes (Signed)
 External images requested from [facility] upon request of Dr. Verma.  Images will be uploaded into [employer], and requested images be imported into Epic.

## 2024-07-30 ENCOUNTER — Other Ambulatory Visit: Payer: Self-pay | Admitting: Otolaryngology

## 2024-07-30 ENCOUNTER — Inpatient Hospital Stay: Admit: 2024-07-30 | Discharge: 2024-07-31 | Payer: Medicare (Managed Care)

## 2024-09-02 ENCOUNTER — Ambulatory Visit: Admit: 2024-09-02 | Admitting: Otolaryngology
